# Patient Record
Sex: Male | Born: 1940 | Race: White | Hispanic: No | Marital: Married | Smoking: Never smoker
Health system: Southern US, Community
[De-identification: ages and names within clinical notes are randomized; demographics above are authoritative.]

## PROBLEM LIST (undated history)

## (undated) DIAGNOSIS — I2510 Atherosclerotic heart disease of native coronary artery without angina pectoris: Secondary | ICD-10-CM

## (undated) DIAGNOSIS — C801 Malignant (primary) neoplasm, unspecified: Secondary | ICD-10-CM

## (undated) DIAGNOSIS — E785 Hyperlipidemia, unspecified: Secondary | ICD-10-CM

## (undated) DIAGNOSIS — K219 Gastro-esophageal reflux disease without esophagitis: Secondary | ICD-10-CM

## (undated) DIAGNOSIS — F32A Depression, unspecified: Secondary | ICD-10-CM

## (undated) DIAGNOSIS — G4733 Obstructive sleep apnea (adult) (pediatric): Secondary | ICD-10-CM

## (undated) DIAGNOSIS — Z86010 Personal history of colonic polyps: Secondary | ICD-10-CM

## (undated) DIAGNOSIS — F419 Anxiety disorder, unspecified: Secondary | ICD-10-CM

## (undated) DIAGNOSIS — H269 Unspecified cataract: Secondary | ICD-10-CM

## (undated) DIAGNOSIS — F329 Major depressive disorder, single episode, unspecified: Secondary | ICD-10-CM

## (undated) DIAGNOSIS — E119 Type 2 diabetes mellitus without complications: Secondary | ICD-10-CM

## (undated) DIAGNOSIS — I10 Essential (primary) hypertension: Secondary | ICD-10-CM

## (undated) DIAGNOSIS — G4730 Sleep apnea, unspecified: Secondary | ICD-10-CM

## (undated) DIAGNOSIS — Z800 Family history of malignant neoplasm of digestive organs: Secondary | ICD-10-CM

## (undated) DIAGNOSIS — Z8619 Personal history of other infectious and parasitic diseases: Secondary | ICD-10-CM

## (undated) HISTORY — DX: Family history of malignant neoplasm of digestive organs: Z80.0

## (undated) HISTORY — DX: Personal history of colonic polyps: Z86.010

## (undated) HISTORY — DX: Type 2 diabetes mellitus without complications: E11.9

## (undated) HISTORY — DX: Essential (primary) hypertension: I10

## (undated) HISTORY — PX: COLONOSCOPY: SHX174

## (undated) HISTORY — DX: Malignant (primary) neoplasm, unspecified: C80.1

## (undated) HISTORY — DX: Unspecified cataract: H26.9

## (undated) HISTORY — DX: Sleep apnea, unspecified: G47.30

## (undated) HISTORY — DX: Personal history of other infectious and parasitic diseases: Z86.19

## (undated) HISTORY — PX: EYE SURGERY: SHX253

## (undated) HISTORY — PX: CHOLECYSTECTOMY: SHX55

## (undated) HISTORY — DX: Hyperlipidemia, unspecified: E78.5

## (undated) HISTORY — PX: TONSILLECTOMY: SHX5217

## (undated) HISTORY — DX: Obstructive sleep apnea (adult) (pediatric): G47.33

## (undated) HISTORY — DX: Atherosclerotic heart disease of native coronary artery without angina pectoris: I25.10

## (undated) HISTORY — PX: BIOPSY BREAST: PRO8

## (undated) HISTORY — PX: CORONARY ANGIOPLASTY WITH STENT PLACEMENT: SHX49

## (undated) HISTORY — DX: Gastro-esophageal reflux disease without esophagitis: K21.9

---
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
## 1998-03-04 ENCOUNTER — Encounter: Admission: RE | Admit: 1998-03-04 | Discharge: 1998-06-02 | Payer: Self-pay | Admitting: Family Medicine

## 1998-03-15 ENCOUNTER — Encounter: Payer: Self-pay | Admitting: Cardiovascular Disease

## 1998-03-15 ENCOUNTER — Inpatient Hospital Stay (HOSPITAL_COMMUNITY): Admission: EM | Admit: 1998-03-15 | Discharge: 1998-03-17 | Payer: Self-pay | Admitting: Emergency Medicine

## 1998-05-05 ENCOUNTER — Ambulatory Visit (HOSPITAL_COMMUNITY): Admission: RE | Admit: 1998-05-05 | Discharge: 1998-05-05 | Payer: Self-pay | Admitting: Gastroenterology

## 2002-05-29 ENCOUNTER — Emergency Department (HOSPITAL_COMMUNITY): Admission: EM | Admit: 2002-05-29 | Discharge: 2002-05-29 | Payer: Self-pay | Admitting: Emergency Medicine

## 2002-05-29 ENCOUNTER — Encounter: Payer: Self-pay | Admitting: Emergency Medicine

## 2004-02-04 ENCOUNTER — Ambulatory Visit (HOSPITAL_COMMUNITY): Admission: RE | Admit: 2004-02-04 | Discharge: 2004-02-04 | Payer: Self-pay | Admitting: Gastroenterology

## 2005-11-01 ENCOUNTER — Emergency Department (HOSPITAL_COMMUNITY): Admission: EM | Admit: 2005-11-01 | Discharge: 2005-11-01 | Payer: Self-pay | Admitting: Emergency Medicine

## 2007-05-23 ENCOUNTER — Encounter: Payer: Self-pay | Admitting: Family Medicine

## 2008-06-23 ENCOUNTER — Encounter: Payer: Self-pay | Admitting: Family Medicine

## 2009-07-09 ENCOUNTER — Ambulatory Visit: Payer: Self-pay | Admitting: Family Medicine

## 2009-07-09 DIAGNOSIS — I10 Essential (primary) hypertension: Secondary | ICD-10-CM

## 2009-07-09 DIAGNOSIS — Z860100 Personal history of colon polyps, unspecified: Secondary | ICD-10-CM | POA: Insufficient documentation

## 2009-07-09 DIAGNOSIS — I2510 Atherosclerotic heart disease of native coronary artery without angina pectoris: Secondary | ICD-10-CM | POA: Insufficient documentation

## 2009-07-09 DIAGNOSIS — E119 Type 2 diabetes mellitus without complications: Secondary | ICD-10-CM

## 2009-07-09 DIAGNOSIS — G4733 Obstructive sleep apnea (adult) (pediatric): Secondary | ICD-10-CM | POA: Insufficient documentation

## 2009-07-09 DIAGNOSIS — E785 Hyperlipidemia, unspecified: Secondary | ICD-10-CM

## 2009-07-09 HISTORY — DX: Hyperlipidemia, unspecified: E78.5

## 2009-07-09 HISTORY — DX: Personal history of colonic polyps: Z86.010

## 2009-07-09 HISTORY — DX: Essential (primary) hypertension: I10

## 2009-07-09 HISTORY — DX: Type 2 diabetes mellitus without complications: E11.9

## 2009-07-09 HISTORY — DX: Atherosclerotic heart disease of native coronary artery without angina pectoris: I25.10

## 2009-07-09 HISTORY — DX: Personal history of colon polyps, unspecified: Z86.0100

## 2009-07-09 HISTORY — DX: Obstructive sleep apnea (adult) (pediatric): G47.33

## 2009-07-09 LAB — CONVERTED CEMR LAB
Cholesterol, target level: 200 mg/dL
HDL goal, serum: 40 mg/dL
LDL Goal: 70 mg/dL

## 2009-07-14 LAB — CONVERTED CEMR LAB
ALT: 33 units/L (ref 0–53)
AST: 33 units/L (ref 0–37)
Albumin: 3.9 g/dL (ref 3.5–5.2)
Alkaline Phosphatase: 40 units/L (ref 39–117)
BUN: 14 mg/dL (ref 6–23)
Bilirubin, Direct: 0.2 mg/dL (ref 0.0–0.3)
CO2: 30 meq/L (ref 19–32)
Calcium: 9 mg/dL (ref 8.4–10.5)
Chloride: 111 meq/L (ref 96–112)
Cholesterol: 94 mg/dL (ref 0–200)
Creatinine, Ser: 0.9 mg/dL (ref 0.4–1.5)
Creatinine,U: 183.1 mg/dL
GFR calc non Af Amer: 89.22 mL/min (ref >60)
Glucose, Bld: 82 mg/dL (ref 70–99)
HDL: 41.9 mg/dL (ref >39.00)
Hgb A1c MFr Bld: 7.6 % — ABNORMAL HIGH (ref 4.6–6.5)
LDL Cholesterol: 35 mg/dL (ref 0–99)
Microalb Creat Ratio: 9.8 mg/g (ref 0.0–30.0)
Microalb, Ur: 1.8 mg/dL (ref 0.0–1.9)
Potassium: 4.3 meq/L (ref 3.5–5.1)
Sodium: 144 meq/L (ref 135–145)
Total Bilirubin: 0.7 mg/dL (ref 0.3–1.2)
Total CHOL/HDL Ratio: 2
Total Protein: 7.2 g/dL (ref 6.0–8.3)
Triglycerides: 87 mg/dL (ref 0.0–149.0)
VLDL: 17.4 mg/dL (ref 0.0–40.0)

## 2009-07-23 ENCOUNTER — Telehealth: Payer: Self-pay | Admitting: Family Medicine

## 2009-08-03 ENCOUNTER — Ambulatory Visit: Payer: Self-pay | Admitting: Family Medicine

## 2009-08-03 LAB — CONVERTED CEMR LAB
Bilirubin Urine: NEGATIVE
Blood in Urine, dipstick: NEGATIVE
Glucose, Urine, Semiquant: NEGATIVE
Ketones, urine, test strip: NEGATIVE
Nitrite: NEGATIVE
Protein, U semiquant: NEGATIVE
Specific Gravity, Urine: <1.005
Urobilinogen, UA: 0.2
WBC Urine, dipstick: NEGATIVE
pH: 5.5

## 2009-08-05 ENCOUNTER — Ambulatory Visit: Payer: Self-pay | Admitting: Family Medicine

## 2009-08-05 LAB — CONVERTED CEMR LAB
Basophils Absolute: 0 10*3/uL (ref 0.0–0.1)
Basophils Relative: 0.5 % (ref 0.0–3.0)
Eosinophils Absolute: 0.1 10*3/uL (ref 0.0–0.7)
Eosinophils Relative: 1.6 % (ref 0.0–5.0)
HCT: 43.4 % (ref 39.0–52.0)
Hemoglobin: 14.9 g/dL (ref 13.0–17.0)
Lymphocytes Relative: 17.4 % (ref 12.0–46.0)
Lymphs Abs: 0.9 10*3/uL (ref 0.7–4.0)
MCHC: 34.3 g/dL (ref 30.0–36.0)
MCV: 89.3 fL (ref 78.0–100.0)
Monocytes Absolute: 0.7 10*3/uL (ref 0.1–1.0)
Monocytes Relative: 12.5 % — ABNORMAL HIGH (ref 3.0–12.0)
Neutro Abs: 3.6 10*3/uL (ref 1.4–7.7)
Neutrophils Relative %: 68 % (ref 43.0–77.0)
PSA: 1.36 ng/mL (ref 0.10–4.00)
Platelets: 157 10*3/uL (ref 150.0–400.0)
RBC: 4.86 M/uL (ref 4.22–5.81)
RDW: 14.8 % — ABNORMAL HIGH (ref 11.5–14.6)
WBC: 5.2 10*3/uL (ref 4.5–10.5)

## 2009-08-27 ENCOUNTER — Ambulatory Visit: Payer: Self-pay

## 2009-08-27 ENCOUNTER — Ambulatory Visit: Payer: Self-pay | Admitting: Cardiovascular Disease

## 2009-08-27 ENCOUNTER — Encounter: Payer: Self-pay | Admitting: Family Medicine

## 2009-08-27 ENCOUNTER — Ambulatory Visit (HOSPITAL_COMMUNITY): Admission: RE | Admit: 2009-08-27 | Discharge: 2009-08-27 | Payer: Self-pay | Admitting: Family Medicine

## 2009-08-31 ENCOUNTER — Ambulatory Visit: Payer: Self-pay | Admitting: Family Medicine

## 2009-09-30 ENCOUNTER — Ambulatory Visit: Payer: Self-pay | Admitting: Family Medicine

## 2009-09-30 LAB — CONVERTED CEMR LAB: Hgb A1c MFr Bld: 7.2 % — ABNORMAL HIGH (ref 4.6–6.5)

## 2009-11-29 ENCOUNTER — Telehealth: Payer: Self-pay | Admitting: Family Medicine

## 2009-12-01 ENCOUNTER — Telehealth: Payer: Self-pay | Admitting: Family Medicine

## 2009-12-16 ENCOUNTER — Encounter: Payer: Self-pay | Admitting: Family Medicine

## 2009-12-20 ENCOUNTER — Telehealth: Payer: Self-pay | Admitting: Family Medicine

## 2010-06-02 NOTE — Assessment & Plan Note (Signed)
Summary: CPX//WILL FAST FOR ADDITIONAL LABS//CCM   Vital Signs:  Patient profile:   70 year old male Height:      69.75 inches Weight:      257 pounds Temp:     98.3 degrees F oral Pulse rate:   72 / minute Pulse rhythm:   regular Resp:     12 per minute BP sitting:   130 / 70  (left arm) Cuff size:   large  Vitals Entered By: Sid Falcon LPN (August 03, 1608 9:24 AM) CC: CPX, pt fasting Is Patient Diabetic? Yes Did you bring your meter with you today? No   History of Present Illness: Patient here for complete physical. 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  Chronic medical problems include type 2 diabetes, coronary artery disease with previous stents x2/10 years ago., obstructive sleep apnea, hypertension, hyperlipidemia, and history of colon polyps.  Last tetanus unknown. No history of hepatitis A.  Prior Pneumovax age 79. Due for repeat colonoscopy next year. Exercises 5 days per week. No recent chest pains. He recalls stress test approximately 3 years ago.  Allergies: 1)  Aleve (Naproxen Sodium)  Past History:  Past Surgical History: Last updated: 07/09/2009 Cholecystectomy  [id number] Tonsillectomy  1953 Breast biopsy 1996  Family History: Last updated: 07/09/2009 Mother, hypertension, heart disease, stroke (7) diabetes, pancreatic cancer, deceased age 41 Father, heart disease, hypertension Grandparents, heart disease uncle colon cancer  Social History: Last updated: 07/09/2009 Retired Married Never Smoked Alcohol use-no Regular exercise-yes  Risk Factors: Exercise: yes (07/09/2009)  Risk Factors: Smoking Status: never (07/09/2009)  Past Medical History: Chicken pox Diabetes  Type 2 heart disease  2 stents around 2001 Colonic polyps, hx of Hyperlipidemia Hypertension  Review of Systems  The patient denies anorexia, fever, weight gain, vision loss, decreased hearing, chest pain, syncope, dyspnea on exertion,  prolonged cough, headaches, hemoptysis, abdominal pain, melena, hematochezia, severe indigestion/heartburn, hematuria, incontinence, muscle weakness, suspicious skin lesions, depression, and enlarged lymph nodes.    Physical Exam  General:  Well-developed,well-nourished,in no acute distress; alert,appropriate and cooperative throughout examination Eyes:  pupils equal, pupils round, and pupils reactive to light.   Ears:  External ear exam shows no significant lesions or deformities.  Otoscopic examination reveals clear canals, tympanic membranes are intact bilaterally without bulging, retraction, inflammation or discharge. Hearing is grossly normal bilaterally. Mouth:  Oral mucosa and oropharynx without lesions or exudates.  Teeth in good repair. Neck:  No deformities, masses, or tenderness noted. Lungs:  Normal respiratory effort, chest expands symmetrically. Lungs are clear to auscultation, no crackles or wheezes. Heart:  normal rate, regular rhythm, and no gallop.   Abdomen:  soft, non-tender, no distention, no guarding, no rigidity, no hepatomegaly, and no splenomegaly.  abdomen for hernia which is soft and nontender Rectal:  No external abnormalities noted. Normal sphincter tone. No rectal masses or tenderness. Prostate:  Prostate gland firm and smooth, no enlargement, nodularity, tenderness, mass, asymmetry or induration. Msk:  No deformity or scoliosis noted of thoracic or lumbar spine.   Extremities:  trace edema lower legs bilaterally Neurologic:  alert & oriented X3, cranial nerves II-XII intact, strength normal in all extremities, and sensation intact to light touch.   Skin:  Intact without suspicious lesions or rashes Cervical Nodes:  No lymphadenopathy noted Psych:  normally interactive, good eye contact, not anxious appearing, and not depressed appearing.     Impression & Recommendations:  Problem # 1:  Preventive Health Care (ICD-V70.0)  Prevention issues discussed.  Tetanus  booster. Hep A rec for upcoming trip.  PPD required for forms but no signif risk factors.  PSA added to labs.  Problem # 2:  CAD (ICD-414.00) Forms require normal "stress echo" and pt has not seen cardiologist in some time.  No concerning symptoms but rec f/u with cardiologist. His updated medication list for this problem includes:    Furosemide 20 Mg Tabs (Furosemide) ..... Once daily    Isosorbide Mononitrate Cr 30 Mg Xr24h-tab (Isosorbide mononitrate) ..... Once daily    Lisinopril 40 Mg Tabs (Lisinopril) ..... Once daily    Aspirin 81 Mg Tabs (Aspirin) ..... Once daily    Metoprolol Tartrate 50 Mg Tabs (Metoprolol tartrate) ..... One tab two times a day  Orders: Cardiology Referral (Cardiology)  Complete Medication List: 1)  Acarbose 100 Mg Tabs (Acarbose) .... One tab three times a day 2)  Furosemide 20 Mg Tabs (Furosemide) .... Once daily 3)  Isosorbide Mononitrate Cr 30 Mg Xr24h-tab (Isosorbide mononitrate) .... Once daily 4)  Lisinopril 40 Mg Tabs (Lisinopril) .... Once daily 5)  Avandamet 07-998 Mg Tabs (Rosiglitazone-metformin) .... One by mouth two times a day 6)  Zetia 10 Mg Tabs (Ezetimibe) .... Once daily 7)  Crestor 20 Mg Tabs (Rosuvastatin calcium) .... Once daily 8)  Lantus 100 Unit/ml Soln (Insulin glargine) .... 76 units daily at bedtime 9)  Aspirin 81 Mg Tabs (Aspirin) .... Once daily 10)  Metoprolol Tartrate 50 Mg Tabs (Metoprolol tartrate) .... One tab two times a day  Other Orders: UA Dipstick w/o Micro (manual) (46962) Venipuncture (95284) Hepatitis A Vaccine (Adult Dose) (13244) Admin 1st Vaccine (01027) TB Skin Test (25366) Admin of Any Addtl Vaccine (44034) TD Toxoids IM 7 YR + (74259) TLB-CBC Platelet - w/Differential (85025-CBCD) TLB-PSA (Prostate Specific Antigen) (84153-PSA)   Laboratory Results   Urine Tests    Routine Urinalysis   Color: yellow Appearance: Clear Glucose: negative   (Normal Range: Negative) Bilirubin: negative   (Normal  Range: Negative) Ketone: negative   (Normal Range: Negative) Spec. Gravity: <1.005   (Normal Range: 1.003-1.035) Blood: negative   (Normal Range: Negative) pH: 5.5   (Normal Range: 5.0-8.0) Protein: negative   (Normal Range: Negative) Urobilinogen: 0.2   (Normal Range: 0-1) Nitrite: negative   (Normal Range: Negative) Leukocyte Esterace: negative   (Normal Range: Negative)    Comments: Sid Falcon LPN  August 03, 5636 12:42 PM      Immunizations Administered:  Hepatitis A Vaccine # 1:    Vaccine Type: HepA    Site: left deltoid    Mfr: GlaxoSmithKline    Dose: 1.0 ml    Route: IM    Given by: Sid Falcon LPN    Exp. Date: 08/18/2011    Lot #: [id number]  PPD Skin Test:    Vaccine Type: PPD    Site: right forearm    Mfr: Sanofi Pasteur    Dose: 0.1 ml    Route: ID    Given by: Sid Falcon LPN    Exp. Date: 09/26/2011    Lot #: [id number]  Tetanus Vaccine:    Vaccine Type: Td    Site: left deltoid    Mfr: Sanofi Pasteur    Dose: 0.5 ml    Route: IM    Given by: Sid Falcon LPN    Exp. Date: 03/16/2011    Lot #: [id number]

## 2010-06-02 NOTE — Assessment & Plan Note (Signed)
Summary: NEW PT EST // RS/PTS WIFE RSC/CJR   Vital Signs:  Patient profile:   70 year old male Height:      69.75 inches Weight:      157 pounds BMI:     22.77 Temp:     97.5 degrees F oral Pulse rate:   72 / minute Pulse rhythm:   regular Resp:     12 per minute BP sitting:   138 / 80  (left arm) Cuff size:   large  Vitals Entered By: Sid Falcon LPN (July 09, 2009 8:59 AM) CC: New to Establish from [facility], Hypertension Management, Lipid Management   History of Present Illness: New patient to establish care.  Chronic problems include history of type 2 diabetes, coronary artery disease with 2 prior stents around 1996, hypertension, hyperlipidemia, and history of colon polyps. Also history of obstructive sleep apnea on CPAP. Previous cholecystectomy 1993. Negative breast biopsy 1996.  No signif daytime somnolence.  CPAP seems to be working well.  No recent chest pains.  Sees cardiologist yearly.  Good exercise tolerance.  Allergy to Aleve. Family history reviewed significant for mother with type 2 diabetes and history of pancreatic cancer. Both parents have hypertension and history of stroke.  Nonsmoker. No alcohol use. Patient retired.  Diabetes Management History:      He is (or has been) enrolled in the "Diabetic Education Program".  He states understanding of dietary principles and is following his diet appropriately.  No sensory loss is reported.  Self foot exams are being performed.  He is checking home blood sugars.  He says that he is exercising.        Hypoglycemic symptoms are not occurring.  No hyperglycemic symptoms are reported.    Hypertension History:      He denies headache, chest pain, palpitations, dyspnea with exertion, orthopnea, PND, peripheral edema, visual symptoms, neurologic problems, syncope, and side effects from treatment.        Positive major cardiovascular risk factors include male age 25 years old or older, diabetes, hyperlipidemia, and  hypertension.  Negative major cardiovascular risk factors include non-tobacco-user status.        Positive history for target organ damage include ASHD (either angina/prior MI/prior CABG).  Further assessment for target organ damage reveals no history of stroke/TIA or peripheral vascular disease.    Lipid Management History:      Positive NCEP/ATP III risk factors include male age 69 years old or older, diabetes, hypertension, and ASHD (either angina/prior MI/prior CABG).  Negative NCEP/ATP III risk factors include non-tobacco-user status, no prior stroke/TIA, no peripheral vascular disease, and no history of aortic aneurysm.      Preventive Screening-Counseling & Management  Alcohol-Tobacco     Smoking Status: never  Caffeine-Diet-Exercise     Does Patient Exercise: yes  Allergies (verified): 1)  Aleve (Naproxen Sodium)  Past History:  Family History: Last updated: 07/09/2009 Mother, hypertension, heart disease, stroke (7) diabetes, pancreatic cancer, deceased age 40 Father, heart disease, hypertension Grandparents, heart disease uncle colon cancer  Social History: Last updated: 07/09/2009 Retired Married Never Smoked Alcohol use-no Regular exercise-yes  Risk Factors: Exercise: yes (07/09/2009)  Risk Factors: Smoking Status: never (07/09/2009)  Past Medical History: Chicken pox Diabetes heart disease Colonic polyps, hx of Hyperlipidemia Hypertension  Past Surgical History: Cholecystectomy  1993-1994 Tonsillectomy  1953 Breast biopsy 1996 PMH-FH-SH reviewed for relevance  Family History: Mother, hypertension, heart disease, stroke (7) diabetes, pancreatic cancer, deceased age 61 Father, heart disease, hypertension Grandparents, heart  disease uncle colon cancer  Social History: Retired Married Never Smoked Alcohol use-no Regular exercise-yes Smoking Status:  never Does Patient Exercise:  yes  Review of Systems  The patient denies anorexia,  fever, weight loss, weight gain, chest pain, syncope, dyspnea on exertion, peripheral edema, prolonged cough, headaches, hemoptysis, abdominal pain, melena, hematochezia, and severe indigestion/heartburn.    Physical Exam  General:  Well-developed,well-nourished,in no acute distress; alert,appropriate and cooperative throughout examination Head:  Normocephalic and atraumatic without obvious abnormalities. No apparent alopecia or balding. Eyes:  pupils equal, pupils round, and pupils reactive to light.   Ears:  External ear exam shows no significant lesions or deformities.  Otoscopic examination reveals clear canals, tympanic membranes are intact bilaterally without bulging, retraction, inflammation or discharge. Hearing is grossly normal bilaterally. Mouth:  Oral mucosa and oropharynx without lesions or exudates.  Teeth in good repair. Neck:  No deformities, masses, or tenderness noted. Lungs:  Normal respiratory effort, chest expands symmetrically. Lungs are clear to auscultation, no crackles or wheezes. Heart:  Normal rate and regular rhythm. S1 and S2 normal without gallop, murmur, click, rub or other extra sounds. Abdomen:  Bowel sounds positive,abdomen soft and non-tender without masses, organomegaly or hernias noted. Pulses:  R radial normal and L radial normal.   Extremities:  No clubbing, cyanosis, edema, or deformity noted with normal full range of motion of all joints.   Neurologic:  alert & oriented X3 and cranial nerves II-XII intact.   Skin:  Intact without suspicious lesions or rashes Cervical Nodes:  No lymphadenopathy noted Psych:  normally interactive, good eye contact, not anxious appearing, and not depressed appearing.    Diabetes Management Exam:    Foot Exam (with socks and/or shoes not present):       Sensory-Pinprick/Light touch:          Left medial foot (L-4): normal          Left dorsal foot (L-5): normal          Left lateral foot (S-1): normal          Right  medial foot (L-4): normal          Right dorsal foot (L-5): normal          Right lateral foot (S-1): normal       Sensory-Monofilament:          Left foot: normal          Right foot: normal       Inspection:          Left foot: normal          Right foot: normal       Nails:          Left foot: normal          Right foot: normal    Eye Exam:       Eye Exam done elsewhere          Date: 01/29/2009          Results: normal          Done by: Dr Elmer Picker   Impression & Recommendations:  Problem # 1:  HYPERTENSION (ICD-401.9) not to goal today but pt has recently started exercise program and plans to lose some weight.  Goal < 130/80. His updated medication list for this problem includes:    Furosemide 20 Mg Tabs (Furosemide) ..... Once daily    Lisinopril 40 Mg Tabs (Lisinopril) ..... Once daily  Orders: Venipuncture (16109)  TLB-BMP (Basic Metabolic Panel-BMET) (80048-METABOL)  Problem # 2:  HYPERLIPIDEMIA (ICD-272.4) Needs repeat labs. His updated medication list for this problem includes:    Zetia 10 Mg Tabs (Ezetimibe) ..... Once daily    Crestor 20 Mg Tabs (Rosuvastatin calcium) ..... Once daily  Orders: Venipuncture (91478) TLB-Lipid Panel (80061-LIPID) TLB-Hepatic/Liver Function Pnl (80076-HEPATIC)  Problem # 3:  CAD (ICD-414.00) Assessment: Unchanged  His updated medication list for this problem includes:    Furosemide 20 Mg Tabs (Furosemide) ..... Once daily    Isosorbide Mononitrate Cr 30 Mg Xr24h-tab (Isosorbide mononitrate) ..... Once daily    Lisinopril 40 Mg Tabs (Lisinopril) ..... Once daily    Aspirin 81 Mg Tabs (Aspirin) ..... Once daily  Problem # 4:  OBSTRUCTIVE SLEEP APNEA (ICD-327.23) Assessment: Unchanged  Problem # 5:  DIAB W/O COMP TYPE II/UNS NOT STATED UNCNTRL (ICD-250.00)  His updated medication list for this problem includes:    Acarbose 100 Mg Tabs (Acarbose) ..... One tab three times a day    Lisinopril 40 Mg Tabs (Lisinopril) .....  Once daily    Avandamet 06-998 Mg Tabs (Rosiglitazone-metformin) .Marland Kitchen..Marland Kitchen Two times a day    Lantus 100 Unit/ml Soln (Insulin glargine) .Marland Kitchen... 76 units daily at bedtime    Aspirin 81 Mg Tabs (Aspirin) ..... Once daily  Orders: Venipuncture (29562) TLB-A1C / Hgb A1C (Glycohemoglobin) (83036-A1C) TLB-Microalbumin/Creat Ratio, Urine (82043-MALB)  Problem # 6:  COLONIC POLYPS, HX OF (ICD-V12.72) not due for repeat colonoscopy until next year.  Complete Medication List: 1)  Acarbose 100 Mg Tabs (Acarbose) .... One tab three times a day 2)  Furosemide 20 Mg Tabs (Furosemide) .... Once daily 3)  Isosorbide Mononitrate Cr 30 Mg Xr24h-tab (Isosorbide mononitrate) .... Once daily 4)  Lisinopril 40 Mg Tabs (Lisinopril) .... Once daily 5)  Avandamet 06-998 Mg Tabs (Rosiglitazone-metformin) .... Two times a day 6)  Zetia 10 Mg Tabs (Ezetimibe) .... Once daily 7)  Crestor 20 Mg Tabs (Rosuvastatin calcium) .... Once daily 8)  Lantus 100 Unit/ml Soln (Insulin glargine) .... 76 units daily at bedtime 9)  Aspirin 81 Mg Tabs (Aspirin) .... Once daily  Diabetes Management Assessment/Plan:      The following lipid goals have been established for the patient: Total cholesterol goal of 200; LDL cholesterol goal of 70; HDL cholesterol goal of 40; Triglyceride goal of 150.    Hypertension Assessment/Plan:      The patient's hypertensive risk group is category C: Target organ damage and/or diabetes.  Today's blood pressure is 138/80.    Lipid Assessment/Plan:      Based on NCEP/ATP III, the patient's risk factor category is "history of coronary disease, peripheral vascular disease, cerebrovascular disease, or aortic aneurysm along with either diabetes, current smoker, or LDL > 130 plus HDL < 40 plus triglycerides > 200".  The patient's lipid goals are as follows: Total cholesterol goal is 200; LDL cholesterol goal is 70; HDL cholesterol goal is 40; Triglyceride goal is 150.    Patient Instructions: 1)  It is  important that you exercise reguarly at least 20 minutes 5 times a week. If you develop chest pain, have severe difficulty breathing, or feel very tired, stop exercising immediately and seek medical attention.  2)  You need to lose weight. Consider a lower calorie diet and regular exercise.  3)  Check your blood sugars regularly. If your readings are usually above:  or below 70 you should contact our office.  4)  It is important that your diabetic A1c level is  checked every 3 months.  5)  See your eye doctor yearly to check for diabetic eye damage. 6)  Check your feet each night  for sore areas, calluses or signs of infection.  7)  Check your  Blood Pressure regularly . If it is above: 140/90  you should make an appointment. 8)  Please schedule a follow-up appointment in 3 months .  Prescriptions: LANTUS 100 UNIT/ML SOLN (INSULIN GLARGINE) 76 units daily at bedtime  #9 vials x 3   Entered and Authorized by:   Evelena Peat MD   Signed by:   Evelena Peat MD on 07/09/2009   Method used:   Electronically to        [facility]* (mail-order)             ,          Ph: [phone]       Fax: [phone]   RxID:   [id number] CRESTOR 20 MG TABS (ROSUVASTATIN CALCIUM) once daily  #90 x 3   Entered and Authorized by:   Evelena Peat MD   Signed by:   Evelena Peat MD on 07/09/2009   Method used:   Electronically to        [facility]* (mail-order)             ,          Ph: [phone]       Fax: [phone]   RxID:   [id number] ZETIA 10 MG TABS (EZETIMIBE) once daily  #90 x 3   Entered and Authorized by:   Evelena Peat MD   Signed by:   Evelena Peat MD on 07/09/2009   Method used:   Electronically to        [facility]* (mail-order)             ,          Ph: [phone]       Fax: [phone]   RxID:   [id number] AVANDAMET 06-998 MG TABS (ROSIGLITAZONE-METFORMIN) two times a day  #180 x 3   Entered and Authorized by:   Evelena Peat MD    Signed by:   Evelena Peat MD on 07/09/2009   Method used:   Electronically to        [facility]* (mail-order)             ,          Ph: [phone]       Fax: [phone]   RxID:   [id number] LISINOPRIL 40 MG TABS (LISINOPRIL) once daily  #90 x 3   Entered and Authorized by:   Evelena Peat MD   Signed by:   Evelena Peat MD on 07/09/2009   Method used:   Electronically to        [facility]* (mail-order)             ,          Ph: [phone]       Fax: [phone]   RxID:   [id number] ISOSORBIDE MONONITRATE CR 30 MG XR24H-TAB (ISOSORBIDE MONONITRATE) once daily  #90 x 3   Entered and Authorized by:   Evelena Peat MD   Signed by:   Evelena Peat MD on 07/09/2009   Method used:   Electronically to        [facility]* (mail-order)             ,  Ph: [phone]       Fax: [phone]   RxID:   [id number] FUROSEMIDE 20 MG TABS (FUROSEMIDE) once daily  #90 x 3   Entered and Authorized by:   Evelena Peat MD   Signed by:   Evelena Peat MD on 07/09/2009   Method used:   Electronically to        [facility]* (mail-order)             ,          Ph: [phone]       Fax: [phone]   RxID:   [id number] ACARBOSE 100 MG TABS (ACARBOSE) one tab three times a day  #270 x 3   Entered and Authorized by:   Evelena Peat MD   Signed by:   Evelena Peat MD on 07/09/2009   Method used:   Electronically to        [facility]* (mail-order)             ,          Ph: [phone]       Fax: [phone]   RxID:   [id number]

## 2010-06-02 NOTE — Letter (Signed)
Summary: Physician's Health Evaluation  Physician's Health Evaluation   Imported By: Maryln Gottron 09/01/2009 13:46:48  _____________________________________________________________________  External Attachment:    Type:   Image     Comment:   External Document

## 2010-06-02 NOTE — Assessment & Plan Note (Signed)
Summary: HEP B INJ/PER NANCY/CJR  Nurse Visit   Allergies: 1)  Aleve (Naproxen Sodium)  Immunizations Administered:  Hepatitis B Vaccine # 1:    Vaccine Type: HepB NB-31yrs    Site: left deltoid    Mfr: Merck    Dose: 1.0 ml    Route: IM    Given by: Sid Falcon LPN    Exp. Date: 07/29/2011    Lot #: [id number]  Orders Added: 1)  Hepatitis B Vaccine NB-23yrs [40981] 2)  Admin 1st Vaccine [19147]

## 2010-06-02 NOTE — Medication Information (Signed)
Summary: Denial of Coverage for Actoplus Met Xr  Denial of Coverage for Actoplus Met Xr   Imported By: Maryln Gottron 12/24/2009 09:22:48  _____________________________________________________________________  External Attachment:    Type:   Image     Comment:   External Document

## 2010-06-02 NOTE — Progress Notes (Signed)
Summary: alternative requested  Phone Note Call from Patient Call back at Home Phone [phone]   Caller: [facility] call Summary of Call: [facility] denied coverage of Actoplus Met XR. pt would like to consider having 2 separate rx, one for Actos and the other for Metformin. if ok, please send  rx for mail order to [facility]. Initial call taken by: Warnell Forester,  December 20, 2009 10:15 AM  Follow-up for Phone Call        done  Follow-up by: Evelena Peat MD,  December 20, 2009 1:04 PM  Additional Follow-up for Phone Call Additional follow up Details #1::        wife is aware of above. Additional Follow-up by: Warnell Forester,  December 20, 2009 1:06 PM    New/Updated Medications: METFORMIN HCL 1000 MG TABS (METFORMIN HCL) one by mouth two times a day ACTOS 30 MG TABS (PIOGLITAZONE HCL) one by mouth once daily Prescriptions: METFORMIN HCL 1000 MG TABS (METFORMIN HCL) one by mouth two times a day  #180 x 3   Entered and Authorized by:   Evelena Peat MD   Signed by:   Evelena Peat MD on 12/20/2009   Method used:   Electronically to        [facility]* (retail)             ,          Ph: [phone]       Fax: [phone]   RxID:   [id number] ACTOS 30 MG TABS (PIOGLITAZONE HCL) one by mouth once daily  #90 x 3   Entered and Authorized by:   Evelena Peat MD   Signed by:   Evelena Peat MD on 12/20/2009   Method used:   Electronically to        [facility]* (retail)             ,          Ph: [phone]       Fax: [phone]   RxID:   [id number]

## 2010-06-02 NOTE — Progress Notes (Signed)
Summary: ActoplusMet XR WCB 8-3  Phone Note Call from Patient Call back at Home Phone [phone]   Caller: vm wife, linda Summary of Call: ActoplusMet XR tabs is not covered by our benefits.  Call [facility] [phone] to ask for an exception, then there is a coverage review and they will decide if they will cover the med.  Call me back at [phone] when you need to call me back.   Initial call taken by: Rudy Jew, RN,  December 01, 2009 4:59 PM  Follow-up for Phone Call        Will they cover Actos and Metformin separately? (ie. not in combination pill) Follow-up by: Evelena Peat MD,  December 01, 2009 5:16 PM  Additional Follow-up for Phone Call Additional follow up Details #1::        Wife will check on this & call in am.    Insurance company is not covered the Actos Plus is covered.  Will Metformin, and Actos 15 mg. but only 90 days.  Wife will get the 90 day refill, and have [facility] sent referral request. Additional Follow-up by: Rudy Jew, RN,  December 01, 2009 5:31 PM

## 2010-06-02 NOTE — Letter (Signed)
Summary: Physician's Health Evaluation for [facility]  Physician's Health Evaluation for [facility]   Imported By: Maryln Gottron 10/29/2009 14:33:03  _____________________________________________________________________  External Attachment:    Type:   Image     Comment:   External Document

## 2010-06-02 NOTE — Progress Notes (Signed)
Summary: change of meds.  Phone Note Call from Patient   Caller: Patient Call For: Evelena Peat MD Summary of Call: [facility] for 3 months. Is finishing Avandamet, and needs the subtitute sent to [facility], please. [phone]  Initial call taken by: Lynann Beaver CMA,  November 29, 2009 3:33 PM  Follow-up for Phone Call        will change to ActoPlusmet 15/1000 mg by mouth two times a day as discussed. Follow-up by: Evelena Peat MD,  November 29, 2009 5:39 PM    New/Updated Medications: ACTOPLUS MET XR 15-1000 MG XR24H-TAB (PIOGLITAZONE HCL-METFORMIN HCL) one by mouth two times a day Prescriptions: ACTOPLUS MET XR 15-1000 MG XR24H-TAB (PIOGLITAZONE HCL-METFORMIN HCL) one by mouth two times a day  #180 x 3   Entered and Authorized by:   Evelena Peat MD   Signed by:   Evelena Peat MD on 11/29/2009   Method used:   Electronically to        [facility] (retail)             ,          Ph: [phone]       Fax: [phone]   RxID:   [id number]  Pt. advised.

## 2010-06-02 NOTE — Progress Notes (Signed)
Summary: Actoplus Met denied by [facility]  Phone Note Outgoing Call   Summary of Call: Actoplus Met has been denied by [facility]. pt and and wife are aware. Initial call taken by: Warnell Forester,  December 20, 2009 10:17 AM

## 2010-06-02 NOTE — Progress Notes (Signed)
Summary: needs meds from [facility], Avandamet dose question  Phone Note Call from Patient Call back at Home Phone [phone] Message from:  Patient  Caller: Spouse----live call Call For: Evelena Peat MD Summary of Call: Needs new rx for Metoprolol 50mg  send to [facility].  Wife says that patient is on Avandamet 07-998 mg, not 2-1000mg . Please send a new rx to [facility] and note in his med chart. Thanks. Initial call taken by: Warnell Forester,  July 23, 2009 12:51 PM  Follow-up for Phone Call        Wife states metroprolol 50mg  is on the med list he brought home.  I do not see it?  Also she wants to know how to handle the Avandamet 07-998, what is the difference between the 07-998 and the 06-998?  A new Rx will need to be sent to [facility] for both? Follow-up by: Sid Falcon LPN,  July 23, 2009 1:11 PM  Additional Follow-up for Phone Call Additional follow up Details #1::        OK to refill both as long as he has been on metoprolol in the past.  The difference in the 06-998 and 07-998 is the Avandia component of Avandamet.  Refill both for 1 years. Additional Follow-up by: Evelena Peat MD,  July 23, 2009 1:27 PM    Additional Follow-up for Phone Call Additional follow up Details #2::    Metoprolol 50mg  two times a day confirmed with wife, sent to [facility].  Pt has received the Avandamet 06-998 from [facility] already, #90.  What to do, try to return it or take it until he needs a refill and then make the correction to 07-998? Sid Falcon LPN  July 23, 2009 2:11 PM  I think the 06-998 combination will not be vastly different from the 07-998 dose in terms of glucose effect and pt is in process of weight loss program which will improve his sugars greatly so lets have him complete his current prescription.  Follow-up by: Evelena Peat MD,  July 23, 2009 4:18 PM  Additional Follow-up for Phone Call Additional follow up Details #3:: Details for Additional Follow-up Action Taken: Pt wife  informed Additional Follow-up by: Sid Falcon LPN,  July 23, 2009 4:57 PM  New/Updated Medications: AVANDAMET 07-998 MG TABS (ROSIGLITAZONE-METFORMIN) two tab daily AVANDAMET 07-998 MG TABS (ROSIGLITAZONE-METFORMIN) one by mouth two times a day METOPROLOL TARTRATE 50 MG TABS (METOPROLOL TARTRATE) one tab two times a day Prescriptions: METOPROLOL TARTRATE 50 MG TABS (METOPROLOL TARTRATE) one tab two times a day  #180 x 3   Entered by:   Sid Falcon LPN   Authorized by:   Evelena Peat MD   Signed by:   Sid Falcon LPN on 09/81/1914   Method used:   Electronically to        [facility] (mail-order)             ,          Ph: [phone]       Fax: [phone]   RxID:   [id number]

## 2010-06-02 NOTE — Letter (Signed)
Summary: Records from [facility] 2008 -2010  Records from [facility] 2008 -2010   Imported By: Maryln Gottron 2020-09-209 13:45:08  _____________________________________________________________________  External Attachment:    Type:   Image     Comment:   External Document

## 2010-06-02 NOTE — Assessment & Plan Note (Signed)
Summary: TB TEST READING/[facility]  Nurse Visit   Allergies: 1)  Aleve (Naproxen Sodium)  PPD Results    Date of reading: 08/05/2009    Results: < 5mm    Interpretation: negative

## 2010-06-02 NOTE — Assessment & Plan Note (Signed)
Summary: 3 MONTH FUP-FASTING//CCM wife rsc/njr   Vital Signs:  Patient profile:   70 year old male Height:      69.75 inches Weight:      252 pounds Temp:     97.7 degrees F oral Pulse rate:   80 / minute Pulse rhythm:   regular Resp:     12 per minute BP sitting:   140 / 72  (left arm) Cuff size:   large  Vitals Entered By: Sid Falcon LPN (September 30, 452 8:34 AM) CC: 3 month follow-up   History of Present Illness: Follow up diabetes.  Last A1C 7.6%.  Losing weight through his efforts. No hypoglycemia.  Meds reviewed.   FAsting CBGs ranging 75-115.  Eye exam 2/11.  No retinopathy.  Diabetes Management History:      He is (or has been) enrolled in the "Diabetic Education Program".  He states understanding of dietary principles and is following his diet appropriately.  No sensory loss is reported.  Self foot exams are being performed.  He is checking home blood sugars.  He says that he is exercising.        Hypoglycemic symptoms are not occurring.  No hyperglycemic symptoms are reported.    Allergies: 1)  Aleve (Naproxen Sodium)  Past History:  Past Medical History: Last updated: 08/03/2009 Chicken pox Diabetes  Type 2 heart disease  2 stents around 2001 Colonic polyps, hx of Hyperlipidemia Hypertension PMH reviewed for relevance  Review of Systems      See HPI  Physical Exam  General:  Well-developed,well-nourished,in no acute distress; alert,appropriate and cooperative throughout examination Lungs:  Normal respiratory effort, chest expands symmetrically. Lungs are clear to auscultation, no crackles or wheezes. Heart:  normal rate and regular rhythm.   Extremities:  trace edema.  Diabetes Management Exam:    Foot Exam (with socks and/or shoes not present):       Sensory-Pinprick/Light touch:          Left medial foot (L-4): normal          Left dorsal foot (L-5): normal          Left lateral foot (S-1): normal          Right medial foot (L-4): normal      Right dorsal foot (L-5): normal          Right lateral foot (S-1): normal       Sensory-Monofilament:          Left foot: normal          Right foot: normal       Inspection:          Left foot: normal          Right foot: normal       Nails:          Left foot: normal          Right foot: normal    Eye Exam:       Eye Exam done elsewhere          Date: 06/02/2009          Results: normal   Impression & Recommendations:  Problem # 1:  DIAB W/O COMP TYPE II/UNS NOT STATED UNCNTRL (ICD-250.00) Assessment Unchanged discussed possible change to Actos Met and pt will use current supply of Avandamet first. His updated medication list for this problem includes:    Acarbose 100 Mg Tabs (Acarbose) ..... One tab three times a  day    Lisinopril 40 Mg Tabs (Lisinopril) ..... Once daily    Avandamet 07-998 Mg Tabs (Rosiglitazone-metformin) ..... One by mouth two times a day    Lantus 100 Unit/ml Soln (Insulin glargine) .Marland Kitchen... 76 units daily at bedtime    Aspirin 81 Mg Tabs (Aspirin) ..... Once daily  Orders: TLB-A1C / Hgb A1C (Glycohemoglobin) (83036-A1C) Venipuncture (04540)  Problem # 2:  Preventive Health Care (ICD-V70.0) pt needs 2nd hep B (for mission trip) and will return in 4 months for 3 rd Hep B and 2nd Hep A.  Complete Medication List: 1)  Acarbose 100 Mg Tabs (Acarbose) .... One tab three times a day 2)  Furosemide 20 Mg Tabs (Furosemide) .... Once daily 3)  Isosorbide Mononitrate Cr 30 Mg Xr24h-tab (Isosorbide mononitrate) .... Once daily 4)  Lisinopril 40 Mg Tabs (Lisinopril) .... Once daily 5)  Avandamet 07-998 Mg Tabs (Rosiglitazone-metformin) .... One by mouth two times a day 6)  Zetia 10 Mg Tabs (Ezetimibe) .... Once daily 7)  Crestor 20 Mg Tabs (Rosuvastatin calcium) .... Once daily 8)  Lantus 100 Unit/ml Soln (Insulin glargine) .... 76 units daily at bedtime 9)  Aspirin 81 Mg Tabs (Aspirin) .... Once daily 10)  Metoprolol Tartrate 50 Mg Tabs (Metoprolol  tartrate) .... One tab two times a day  Other Orders: State-Hepatitis B Vaccine Ped/Adol 3 dose IM  (98119J) Admin 1st Vaccine (47829)  Diabetes Management Assessment/Plan:      The following lipid goals have been established for the patient: Total cholesterol goal of 200; LDL cholesterol goal of 70; HDL cholesterol goal of 40; Triglyceride goal of 150.    Patient Instructions: 1)  Please schedule a follow-up appointment in 4 months .  2)  You need to lose weight. Consider a lower calorie diet and regular exercise.  3)  Check your blood sugars regularly. If your readings are usually above:  or below 70 you should contact our office.  4)  It is important that your diabetic A1c level is checked every 3 months.  5)  See your eye doctor yearly to check for diabetic eye damage. 6)  Check your feet each night  for sore areas, calluses or signs of infection.     Immunizations Administered:  Hepatitis B Vaccine # 2:    Vaccine Type: State HepB Ped/Adol    Site: left deltoid    Mfr: Merck    Dose: 1.0 ml    Route: IM    Given by: Sid Falcon LPN    Exp. Date: 07/29/2011    Lot #: [id number]

## 2010-11-30 HISTORY — PX: CORONARY ARTERY BYPASS GRAFT: SHX141

## 2011-08-11 ENCOUNTER — Ambulatory Visit (INDEPENDENT_AMBULATORY_CARE_PROVIDER_SITE_OTHER): Payer: 59 | Admitting: Family Medicine

## 2011-08-11 ENCOUNTER — Encounter: Payer: Self-pay | Admitting: Family Medicine

## 2011-08-11 VITALS — BP 130/70 | Temp 98.4°F | Wt 228.0 lb

## 2011-08-11 DIAGNOSIS — E119 Type 2 diabetes mellitus without complications: Secondary | ICD-10-CM

## 2011-08-11 DIAGNOSIS — I10 Essential (primary) hypertension: Secondary | ICD-10-CM

## 2011-08-11 DIAGNOSIS — I2510 Atherosclerotic heart disease of native coronary artery without angina pectoris: Secondary | ICD-10-CM

## 2011-08-11 DIAGNOSIS — E785 Hyperlipidemia, unspecified: Secondary | ICD-10-CM

## 2011-08-11 LAB — BASIC METABOLIC PANEL
Calcium: 9.1 mg/dL (ref 8.4–10.5)
GFR: 82.31 mL/min (ref >60.00)
Glucose, Bld: 105 mg/dL — ABNORMAL HIGH (ref 70–99)
Potassium: 4.1 mEq/L (ref 3.5–5.1)
Sodium: 141 mEq/L (ref 135–145)

## 2011-08-11 LAB — HEMOGLOBIN A1C: Hgb A1c MFr Bld: 6.2 % (ref 4.6–6.5)

## 2011-08-11 NOTE — Progress Notes (Signed)
  Subjective:    Patient ID: A Tristan Hughes, male    DOB: [birth year], 71 y.o.   MRN: [id number]  HPI  Patient just returned from 18 months of living in [place] on a mission trip with their church. He has chronic problems including history of obesity, type 2 diabetes, hyperlipidemia, obstructive sleep apnea, hypertension, and diagnosed with CAD while in [place]. He developed last August some exertional chest discomfort. Underwent catheterization which revealed left main 3 vessel disease.  Catheterization revealed 90% stenosis of the left main, 90% stenosis of LAD, 99% stenosis of circumflex and 80% stenosis RCA. Ejection fraction greater than 50%. Patient underwent four-vessel bypass graft without complication.  Doing well this time. He is walking about 10 miles per week. Has lost some weight due to his efforts since his surgery. No recurrent chest pain. Diabetes prior to leaving has been well controlled recent A1c 6.1%. Currently taking a regimen of Lantus insulin 46 units daily and Humalog 3 times daily with meals (varies between 15-18 units). No recent hypoglycemia. Needs followup eye exam.  Hyperlipidemia treated with Crestor 20 mg daily, Zetia 10 mg daily.  Compliant with all medications. Denies any side effects.  On furosemide 20 mg twice a day for some edema issues.    Review of Systems  Constitutional: Negative for fever, activity change, appetite change and fatigue.  HENT: Negative for ear pain, congestion and trouble swallowing.   Eyes: Negative for pain and visual disturbance.  Respiratory: Negative for cough, shortness of breath and wheezing.   Cardiovascular: Negative for chest pain and palpitations.  Gastrointestinal: Negative for nausea, vomiting, abdominal pain, diarrhea, constipation, blood in stool, abdominal distention and rectal pain.  Genitourinary: Negative for dysuria, hematuria and testicular pain.  Musculoskeletal: Negative for joint swelling and  arthralgias.  Skin: Negative for rash.  Neurological: Negative for dizziness, syncope and headaches.  Hematological: Negative for adenopathy.  Psychiatric/Behavioral: Negative for confusion and dysphoric mood.       Objective:   Physical Exam  Constitutional: He is oriented to person, place, and time. He appears well-developed and well-nourished.  HENT:  Right Ear: External ear normal.  Left Ear: External ear normal.  Mouth/Throat: Oropharynx is clear and moist.  Neck: Neck supple. No thyromegaly present.  Cardiovascular: Normal rate and regular rhythm.  Exam reveals no gallop.   Pulmonary/Chest: Effort normal and breath sounds normal. No respiratory distress. He has no wheezes. He has no rales.  Musculoskeletal: He exhibits no edema.       Feet reveal no skin lesions. Good distal foot pulses. Good capillary refill. No calluses. Normal sensation with monofilament testing   Neurological: He is alert and oriented to person, place, and time. No cranial nerve deficit.          Assessment & Plan:  #1 CAD with bypass times four 12/03/2010 and has done well since then  #2 hyperlipidemia. Recent lipids prior to leaving [place] at goal and stable. Refill current medications #3 type 2 diabetes. Recent excellent control. Recheck A1c. Schedule eye exam. Confirm prior Pneumovax #4 hypertension stable. Refill metoprolol, furosemide, and lisinopril.

## 2011-08-11 NOTE — Patient Instructions (Signed)
Schedule eye exam soon Check on previous pneumovax

## 2011-08-13 ENCOUNTER — Encounter: Payer: Self-pay | Admitting: Family Medicine

## 2011-08-15 NOTE — Progress Notes (Signed)
Quick Note:  Pt informed ______ 

## 2011-08-21 ENCOUNTER — Encounter: Payer: Self-pay | Admitting: Family Medicine

## 2011-08-22 ENCOUNTER — Telehealth: Payer: Self-pay | Admitting: Family Medicine

## 2011-08-22 NOTE — Telephone Encounter (Signed)
Patient called in stating that his all of his rxs were supposed [facility] and this was never done. pts id number is [id number]. Please assist.

## 2011-08-23 MED ORDER — ROSUVASTATIN CALCIUM 20 MG PO TABS: 20.0000 mg | ORAL_TABLET | Freq: Every day | ORAL | Status: DC

## 2011-08-23 MED ORDER — METOPROLOL TARTRATE 50 MG PO TABS: 50.0000 mg | ORAL_TABLET | Freq: Two times a day (BID) | ORAL | Status: DC

## 2011-08-23 MED ORDER — METFORMIN HCL 1000 MG PO TABS: 1000.0000 mg | ORAL_TABLET | Freq: Two times a day (BID) | ORAL | Status: DC

## 2011-08-23 MED ORDER — INSULIN GLARGINE 100 UNIT/ML ~~LOC~~ SOLN: SUBCUTANEOUS | Status: DC

## 2011-08-23 MED ORDER — LISINOPRIL 40 MG PO TABS: 40.0000 mg | ORAL_TABLET | Freq: Every day | ORAL | Status: DC

## 2011-08-23 MED ORDER — FUROSEMIDE 20 MG PO TABS: 20.0000 mg | ORAL_TABLET | Freq: Two times a day (BID) | ORAL | Status: DC

## 2011-08-23 MED ORDER — EZETIMIBE 10 MG PO TABS: 10.0000 mg | ORAL_TABLET | Freq: Every day | ORAL | Status: DC

## 2011-08-24 ENCOUNTER — Ambulatory Visit: Payer: Self-pay | Admitting: Family Medicine

## 2011-11-23 ENCOUNTER — Other Ambulatory Visit: Payer: Self-pay | Admitting: *Deleted

## 2011-11-23 ENCOUNTER — Encounter: Payer: Self-pay | Admitting: Family Medicine

## 2011-11-23 MED ORDER — METOPROLOL TARTRATE 50 MG PO TABS: ORAL_TABLET | ORAL | Status: DC

## 2011-12-11 ENCOUNTER — Ambulatory Visit: Payer: 59 | Admitting: Family Medicine

## 2011-12-19 ENCOUNTER — Encounter: Payer: Self-pay | Admitting: Family Medicine

## 2011-12-19 ENCOUNTER — Ambulatory Visit (INDEPENDENT_AMBULATORY_CARE_PROVIDER_SITE_OTHER): Payer: 59 | Admitting: Family Medicine

## 2011-12-19 VITALS — BP 130/70 | Temp 98.1°F | Wt 225.0 lb

## 2011-12-19 DIAGNOSIS — E785 Hyperlipidemia, unspecified: Secondary | ICD-10-CM

## 2011-12-19 DIAGNOSIS — I10 Essential (primary) hypertension: Secondary | ICD-10-CM

## 2011-12-19 DIAGNOSIS — E119 Type 2 diabetes mellitus without complications: Secondary | ICD-10-CM

## 2011-12-19 NOTE — Progress Notes (Signed)
  Subjective:    Patient ID: Tristan Hughes, male    DOB: 1941/03/08, 71 y.o.   MRN: [id number]  HPI  Followup type 2 diabetes. Patient has done an excellent job of weight loss. Has lost 3 more pounds. Altogether he has lost about 32 pounds. Bypass one year ago. No recent chest pain. Exercising regularly. No hypoglycemia. Takes Lantus 44 units daily and Humalog 16 units with meals. Last A1c 6.1%. Other medications reviewed. Remains on Crestor. No myalgias. Lipids were checked back before he moved from [place] and stable. Pulse has remained stable between 60 and 65. Continues metoprolol 50 mg one and one half tablets twice daily  Past Medical History  Diagnosis Date  . CAD 07/09/2009    Cath 12-02-10 90% left main, 90%LAD, 99% circumflex, 80% RCA  . COLONIC POLYPS, HX OF 07/09/2009    Qualifier: Diagnosis of  By: Gabriel Rung LPN, Harriett Sine    . DIAB W/O COMP TYPE II/UNS NOT STATED UNCNTRL 07/09/2009    Qualifier: Diagnosis of  By: Rita Ohara    . HYPERLIPIDEMIA 07/09/2009    Qualifier: Diagnosis of  By: Gabriel Rung LPN, Harriett Sine    . HYPERTENSION 07/09/2009    Qualifier: Diagnosis of  By: Gabriel Rung LPN, Harriett Sine    . OBSTRUCTIVE SLEEP APNEA 07/09/2009    Qualifier: Diagnosis of  By: Rita Ohara    . History of chicken pox    Past Surgical History  Procedure Date  . Cholecystectomy   . Biopsy breast   . Tonsillectomy   . Coronary angioplasty with stent placement   . Coronary artery bypass graft 8/12    CABG X 4 LIMA-LAD, SVG-PDB, SVG-OM/RAMUS   /    reports that he has never smoked. He has never used smokeless tobacco. He reports that he does not drink alcohol or use illicit drugs. family history includes Cancer in his mother; Diabetes in his mother; Heart disease in his father and mother; Hypertension in his father and mother; and Stroke in his mother. Allergies  Allergen Reactions  . Naproxen Sodium     REACTION: swelling      Review of Systems  Constitutional: Negative for fatigue.    Eyes: Negative for visual disturbance.  Respiratory: Negative for cough, chest tightness and shortness of breath.   Cardiovascular: Negative for chest pain, palpitations and leg swelling.  Neurological: Negative for dizziness, syncope, weakness, light-headedness and headaches.       Objective:   Physical Exam  Constitutional: He appears well-developed and well-nourished.  Neck: Neck supple. No thyromegaly present.  Cardiovascular: Normal rate and regular rhythm.   Pulmonary/Chest: Effort normal and breath sounds normal. No respiratory distress. He has no wheezes. He has no rales.  Musculoskeletal: He exhibits no edema.  Lymphadenopathy:    He has no cervical adenopathy.          Assessment & Plan:  #1 type 2 diabetes. Recheck A1c. If stable consider repeat in 6 months. Continue weight loss efforts #2 history of hyperlipidemia. Recheck lipids at followup.  #3 history of CAD. Done well since his bypass one year ago

## 2011-12-20 NOTE — Progress Notes (Signed)
Quick Note:  Left a message for pt to return call. ______ 

## 2012-02-16 ENCOUNTER — Telehealth: Payer: Self-pay | Admitting: Family Medicine

## 2012-02-16 MED ORDER — INSULIN LISPRO 100 UNIT/ML ~~LOC~~ SOLN: SUBCUTANEOUS | Status: DC

## 2012-02-16 NOTE — Telephone Encounter (Signed)
Pts spouse called and said that pt uses [facility] and needs to get a renewal on pt insulin lispro (HUMALOG) 100 UNIT/ML injection to [facility] mail order pharmacy.

## 2012-02-27 ENCOUNTER — Telehealth: Payer: Self-pay | Admitting: Family Medicine

## 2012-02-27 MED ORDER — INSULIN LISPRO 100 UNIT/ML ~~LOC~~ SOLN: SUBCUTANEOUS | Status: DC

## 2012-02-27 NOTE — Telephone Encounter (Signed)
Rx sent electronlcally today, pt wife informed Rx was also faxed on 10/18?  Not sure what happened?

## 2012-02-27 NOTE — Telephone Encounter (Signed)
Pts wife called and said that she has checked [facility] and the insulin lispro (HUMALOG) 100 UNIT/ML injection is still not there. Pls call in today.

## 2012-03-01 ENCOUNTER — Telehealth: Payer: Self-pay | Admitting: Family Medicine

## 2012-03-01 NOTE — Telephone Encounter (Signed)
Pt needs script sentfaxed to [facility] for BD Insulin syringes NDC [phone]   DX  code(s) Tests :3 times a day

## 2012-03-07 MED ORDER — "INSULIN SYRINGE-NEEDLE U-100 31G X 5/16"" 0.3 ML MISC": Status: DC

## 2012-03-07 NOTE — Telephone Encounter (Signed)
Wife informed syringes sent to [facility]

## 2012-03-12 ENCOUNTER — Telehealth: Payer: Self-pay | Admitting: Family Medicine

## 2012-03-12 MED ORDER — "INSULIN SYRINGE-NEEDLE U-100 31G X 5/16"" 0.3 ML MISC": Status: DC

## 2012-03-12 NOTE — Telephone Encounter (Signed)
Pts spouse called re: Insulin Syringe-Needle U-100 (B-D INSULIN SYRINGE) 31G X 5/16" 0.3 ML MISC. [facility] says that they did not rcv the prescription. Pts spouse said to call [facility] fax # and press Option 2, to speak to a representative and call it in directly. Pls notify spouse when done.

## 2012-03-12 NOTE — Telephone Encounter (Signed)
I called [facility],spoke with Jimmy.  He was not able to find the pt in their records, not be name, DOB, address, phone??  I informed wife Rx had been sent electronically, it was faxed earlier last week??  She requested a hard copy Rx for her to mail with a letter to [facility]

## 2012-03-13 ENCOUNTER — Telehealth: Payer: Self-pay | Admitting: Family Medicine

## 2012-03-13 MED ORDER — "INSULIN SYRINGE-NEEDLE U-100 31G X 5/16"" 0.3 ML MISC": Status: DC

## 2012-03-13 NOTE — Telephone Encounter (Signed)
Tristan Hughes, please reference call from 11/11. Wife called [facility]. They need you to fax another rx for the diabetic needles. This is exactly what they want on the rx: patient name, member ID: [id number], patient address, patient DOB, and the rx.  Rx needs to say: SURE-JECT INS SYR 1mL 10's 31g-5/16", QTY: 3 boxes.  Please fax script with all of this info to [facility] at: [phone]. Thank you. Wife stated that the rx from yesterday said "BD INS SYR", and they do not carry that. It must say what I've typed above.

## 2012-03-13 NOTE — Telephone Encounter (Signed)
This was done, printed as directed, will fax to number requested.

## 2012-06-21 ENCOUNTER — Ambulatory Visit: Payer: 59 | Admitting: Family Medicine

## 2012-07-03 ENCOUNTER — Encounter: Payer: Self-pay | Admitting: Family Medicine

## 2012-07-03 ENCOUNTER — Ambulatory Visit (INDEPENDENT_AMBULATORY_CARE_PROVIDER_SITE_OTHER): Payer: 59 | Admitting: Family Medicine

## 2012-07-03 VITALS — BP 150/70 | HR 64 | Temp 97.6°F | Wt 226.0 lb

## 2012-07-03 DIAGNOSIS — I2510 Atherosclerotic heart disease of native coronary artery without angina pectoris: Secondary | ICD-10-CM

## 2012-07-03 DIAGNOSIS — I10 Essential (primary) hypertension: Secondary | ICD-10-CM

## 2012-07-03 DIAGNOSIS — E119 Type 2 diabetes mellitus without complications: Secondary | ICD-10-CM

## 2012-07-03 LAB — BASIC METABOLIC PANEL
Calcium: 9.1 mg/dL (ref 8.4–10.5)
GFR: 81.12 mL/min (ref >60.00)
Sodium: 142 mEq/L (ref 135–145)

## 2012-07-03 LAB — LIPID PANEL
HDL: 36.4 mg/dL — ABNORMAL LOW (ref >39.00)
Triglycerides: 69 mg/dL (ref 0.0–149.0)
VLDL: 13.8 mg/dL (ref 0.0–40.0)

## 2012-07-03 LAB — HEPATIC FUNCTION PANEL
ALT: 30 U/L (ref 0–53)
AST: 26 U/L (ref 0–37)
Albumin: 3.9 g/dL (ref 3.5–5.2)

## 2012-07-03 LAB — HEMOGLOBIN A1C: Hgb A1c MFr Bld: 6.6 % — ABNORMAL HIGH (ref 4.6–6.5)

## 2012-07-03 NOTE — Progress Notes (Signed)
  Subjective:    Patient ID: Tristan Hughes, male    DOB: December 16, 1940, 72 y.o.   MRN: [id number]  HPI Medical followup. History of coronary artery disease. Stable. No recent chest pains. Somewhat inconsistent with exercise. Type 2 diabetes which is been well controlled. Last A1c 5.9%. Fasting blood sugars generally 90-130. He has increased his Humalog insulin 18 units 3 times daily with meals  Not monitoring blood pressure. Remains on furosemide, lisinopril, and metoprolol. Takes Crestor for hyperlipidemia. Compliant with all medications  Past Medical History  Diagnosis Date  . CAD 07/09/2009    Cath 12-02-10 90% left main, 90%LAD, 99% circumflex, 80% RCA  . COLONIC POLYPS, HX OF 07/09/2009    Qualifier: Diagnosis of  By: Gabriel Rung LPN, Harriett Sine    . DIAB W/O COMP TYPE II/UNS NOT STATED UNCNTRL 07/09/2009    Qualifier: Diagnosis of  By: Rita Ohara    . HYPERLIPIDEMIA 07/09/2009    Qualifier: Diagnosis of  By: Gabriel Rung LPN, Harriett Sine    . HYPERTENSION 07/09/2009    Qualifier: Diagnosis of  By: Gabriel Rung LPN, Harriett Sine    . OBSTRUCTIVE SLEEP APNEA 07/09/2009    Qualifier: Diagnosis of  By: Rita Ohara    . History of chicken pox    Past Surgical History  Procedure Laterality Date  . Cholecystectomy    . Biopsy breast    . Tonsillectomy    . Coronary angioplasty with stent placement    . Coronary artery bypass graft  8/12    CABG X 4 LIMA-LAD, SVG-PDB, SVG-OM/RAMUS   /    reports that he has never smoked. He has never used smokeless tobacco. He reports that he does not drink alcohol or use illicit drugs. family history includes Cancer in his mother; Diabetes in his mother; Heart disease in his father and mother; Hypertension in his father and mother; and Stroke in his mother. Allergies  Allergen Reactions  . Naproxen Sodium     REACTION: swelling      Review of Systems  Constitutional: Negative for fatigue.  Eyes: Negative for visual disturbance.  Respiratory: Negative for cough,  chest tightness and shortness of breath.   Cardiovascular: Negative for chest pain, palpitations and leg swelling.  Neurological: Negative for dizziness, syncope, weakness, light-headedness and headaches.       Objective:   Physical Exam  Constitutional: He appears well-developed and well-nourished. No distress.  HENT:  Head: Normocephalic and atraumatic.  Eyes: Conjunctivae and EOM are normal. Pupils are equal, round, and reactive to light.  Neck: Normal range of motion. Neck supple. No thyromegaly present.  Cardiovascular: Normal rate, regular rhythm and normal heart sounds.   Pulmonary/Chest: No respiratory distress. He has no wheezes. He has no rales.  Musculoskeletal: He exhibits edema.  Trace edema legs bilaterally  Lymphadenopathy:    He has no cervical adenopathy.  Skin:  Feet reveal no lesions  normal sensory function. No calluses.  Psychiatric: He has Tristan normal mood and affect.          Assessment & Plan:  #1history of type 2 diabetes. Good control. Recheck A1c. Work on weight loss establishing more consistent exercise #2 hypertension. Marginal control. Weight loss and reassess 3 months. Consider additional medication then if not better control #3 history of CAD. Recheck lipid and hepatic panel

## 2012-07-03 NOTE — Progress Notes (Signed)
Quick Note:  Left a message for return call. ______ 

## 2012-07-04 NOTE — Progress Notes (Signed)
Quick Note:  Pt wife informed ______ 

## 2012-08-02 ENCOUNTER — Other Ambulatory Visit: Payer: Self-pay | Admitting: Family Medicine

## 2012-08-15 ENCOUNTER — Encounter: Payer: Self-pay | Admitting: Family Medicine

## 2012-08-19 MED ORDER — AMLODIPINE BESYLATE 5 MG PO TABS: 5.0000 mg | ORAL_TABLET | Freq: Every day | ORAL | Status: DC

## 2012-08-20 ENCOUNTER — Encounter: Payer: Self-pay | Admitting: Family Medicine

## 2012-09-17 ENCOUNTER — Other Ambulatory Visit: Payer: Self-pay | Admitting: Family Medicine

## 2012-09-17 ENCOUNTER — Ambulatory Visit (INDEPENDENT_AMBULATORY_CARE_PROVIDER_SITE_OTHER): Payer: 59 | Admitting: Family Medicine

## 2012-09-17 ENCOUNTER — Encounter: Payer: Self-pay | Admitting: Family Medicine

## 2012-09-17 ENCOUNTER — Other Ambulatory Visit: Payer: Self-pay | Admitting: *Deleted

## 2012-09-17 VITALS — BP 130/64 | Temp 98.5°F | Wt 226.0 lb

## 2012-09-17 DIAGNOSIS — E119 Type 2 diabetes mellitus without complications: Secondary | ICD-10-CM

## 2012-09-17 DIAGNOSIS — I10 Essential (primary) hypertension: Secondary | ICD-10-CM

## 2012-09-17 MED ORDER — INSULIN LISPRO 100 UNIT/ML ~~LOC~~ SOLN: SUBCUTANEOUS | Status: DC

## 2012-09-17 NOTE — Progress Notes (Signed)
  Subjective:    Patient ID: Tristan Hughes, male    DOB: 1941/04/04, 72 y.o.   MRN: [id number]  HPI Followup hypertension Recently added amlodipine 5 mg daily. Blood pressures much improved since then. No headaches or edema issues. Denies recent chest pains. Medications reviewed and compliant with all.  Diabetes remained stable. Last A1c 6.6%. Due for repeat A1c in one month. Currently, his Humalog supplies not lasting full 3 months. Requesting refills. No recent hypoglycemia.  Past Medical History  Diagnosis Date  . CAD 07/09/2009    Cath 12-02-10 90% left main, 90%LAD, 99% circumflex, 80% RCA  . COLONIC POLYPS, HX OF 07/09/2009    Qualifier: Diagnosis of  By: Gabriel Rung LPN, Harriett Sine    . DIAB W/O COMP TYPE II/UNS NOT STATED UNCNTRL 07/09/2009    Qualifier: Diagnosis of  By: Rita Ohara    . HYPERLIPIDEMIA 07/09/2009    Qualifier: Diagnosis of  By: Gabriel Rung LPN, Harriett Sine    . HYPERTENSION 07/09/2009    Qualifier: Diagnosis of  By: Gabriel Rung LPN, Harriett Sine    . OBSTRUCTIVE SLEEP APNEA 07/09/2009    Qualifier: Diagnosis of  By: Rita Ohara    . History of chicken pox    Past Surgical History  Procedure Laterality Date  . Cholecystectomy    . Biopsy breast    . Tonsillectomy    . Coronary angioplasty with stent placement    . Coronary artery bypass graft  8/12    CABG X 4 LIMA-LAD, SVG-PDB, SVG-OM/RAMUS   /    reports that he has never smoked. He has never used smokeless tobacco. He reports that he does not drink alcohol or use illicit drugs. family history includes Cancer in his mother; Diabetes in his mother; Heart disease in his father and mother; Hypertension in his father and mother; and Stroke in his mother. Allergies  Allergen Reactions  . Naproxen Sodium     REACTION: swelling      Review of Systems  Constitutional: Negative for appetite change, fatigue and unexpected weight change.  Eyes: Negative for visual disturbance.  Respiratory: Negative for cough, chest tightness  and shortness of breath.   Cardiovascular: Negative for chest pain, palpitations and leg swelling.  Neurological: Negative for dizziness, syncope, weakness, light-headedness and headaches.       Objective:   Physical Exam  Constitutional: He appears well-developed and well-nourished. No distress.  Cardiovascular: Normal rate and regular rhythm.   Pulmonary/Chest: Effort normal and breath sounds normal. No respiratory distress. He has no wheezes. He has no rales.  Musculoskeletal: He exhibits no edema.          Assessment & Plan:  Hypertension. Improved and at goal. Continue current medications. Type 2 diabetes. Reorder Humalog with appropriate volume for 3 months supply. Recheck A1c in June as scheduled

## 2012-09-27 ENCOUNTER — Encounter: Payer: Self-pay | Admitting: Family Medicine

## 2012-09-27 MED ORDER — AMLODIPINE BESYLATE 5 MG PO TABS: 5.0000 mg | ORAL_TABLET | Freq: Every day | ORAL | Status: DC

## 2012-10-03 ENCOUNTER — Ambulatory Visit (INDEPENDENT_AMBULATORY_CARE_PROVIDER_SITE_OTHER): Payer: 59 | Admitting: Family Medicine

## 2012-10-03 ENCOUNTER — Encounter: Payer: Self-pay | Admitting: Family Medicine

## 2012-10-03 VITALS — BP 102/56 | HR 54 | Temp 97.8°F | Resp 18 | Wt 228.0 lb

## 2012-10-03 DIAGNOSIS — E119 Type 2 diabetes mellitus without complications: Secondary | ICD-10-CM

## 2012-10-03 DIAGNOSIS — R229 Localized swelling, mass and lump, unspecified: Secondary | ICD-10-CM

## 2012-10-03 LAB — HM DIABETES FOOT EXAM: HM Diabetic Foot Exam: NORMAL

## 2012-10-03 NOTE — Progress Notes (Signed)
  Subjective:    Patient ID: Tristan Hughes, male    DOB: 05-08-1940, 72 y.o.   MRN: [id number]  HPI Followup type 2 diabetes Last A1c 6.6%. Blood sugars been very stable. No recent hypoglycemia. He remains on accommodation of long-acting and short-acting insulin. Had eye exam back in April. No retinopathy changes. No history of neuropathy. No recent chest pains. Compliant with all medications  Left ear nodular lesion present for probably over one year. Occasionally scabs over. Nonpainful. No past history of skin cancer  Past Medical History  Diagnosis Date  . CAD 07/09/2009    Cath 12-02-10 90% left main, 90%LAD, 99% circumflex, 80% RCA  . COLONIC POLYPS, HX OF 07/09/2009    Qualifier: Diagnosis of  By: Gabriel Rung LPN, Harriett Sine    . DIAB W/O COMP TYPE II/UNS NOT STATED UNCNTRL 07/09/2009    Qualifier: Diagnosis of  By: Rita Ohara    . HYPERLIPIDEMIA 07/09/2009    Qualifier: Diagnosis of  By: Gabriel Rung LPN, Harriett Sine    . HYPERTENSION 07/09/2009    Qualifier: Diagnosis of  By: Gabriel Rung LPN, Harriett Sine    . OBSTRUCTIVE SLEEP APNEA 07/09/2009    Qualifier: Diagnosis of  By: Rita Ohara    . History of chicken pox    Past Surgical History  Procedure Laterality Date  . Cholecystectomy    . Biopsy breast    . Tonsillectomy    . Coronary angioplasty with stent placement    . Coronary artery bypass graft  8/12    CABG X 4 LIMA-LAD, SVG-PDB, SVG-OM/RAMUS   /    reports that he has never smoked. He has never used smokeless tobacco. He reports that he does not drink alcohol or use illicit drugs. family history includes Cancer in his mother; Diabetes in his mother; Heart disease in his father and mother; Hypertension in his father and mother; and Stroke in his mother. Allergies  Allergen Reactions  . Naproxen Sodium     REACTION: swelling      Review of Systems  Constitutional: Negative for fatigue and unexpected weight change.  Eyes: Negative for visual disturbance.  Respiratory: Negative  for cough, chest tightness and shortness of breath.   Cardiovascular: Negative for chest pain, palpitations and leg swelling.  Neurological: Negative for dizziness, syncope, weakness, light-headedness and headaches.       Objective:   Physical Exam  Constitutional: He appears well-developed and well-nourished.  HENT:  Left ear reveals approximately 9 mm nodular lesion with slightly crusted center  Neck: Neck supple.  Cardiovascular: Normal rate and regular rhythm.   Pulmonary/Chest: Effort normal and breath sounds normal. No respiratory distress. He has no wheezes. He has no rales.  Musculoskeletal: He exhibits no edema.  Lymphadenopathy:    He has no cervical adenopathy.  Skin:  Feet reveal no skin lesions. Good distal foot pulses. Good capillary refill. No calluses. Normal sensation with monofilament testing           Assessment & Plan:  #1 type 2 diabetes. History of good control. Recent eye exam normal. Recheck A1c. If remains controlled six-month followup #2 nodular lesion left ear. Rule out squamous cell carcinoma. Dermatology referral. Discussed sun protection

## 2012-12-04 ENCOUNTER — Other Ambulatory Visit: Payer: Self-pay

## 2012-12-06 ENCOUNTER — Other Ambulatory Visit: Payer: Self-pay | Admitting: Family Medicine

## 2013-03-06 ENCOUNTER — Other Ambulatory Visit: Payer: Self-pay

## 2013-04-04 ENCOUNTER — Ambulatory Visit: Payer: 59 | Admitting: Family Medicine

## 2013-04-07 ENCOUNTER — Ambulatory Visit: Payer: 59 | Admitting: Family Medicine

## 2013-04-17 ENCOUNTER — Encounter: Payer: Self-pay | Admitting: Family Medicine

## 2013-04-17 ENCOUNTER — Ambulatory Visit (INDEPENDENT_AMBULATORY_CARE_PROVIDER_SITE_OTHER): Payer: 59 | Admitting: Family Medicine

## 2013-04-17 VITALS — BP 120/64 | HR 54 | Temp 97.4°F | Wt 231.0 lb

## 2013-04-17 DIAGNOSIS — Z23 Encounter for immunization: Secondary | ICD-10-CM

## 2013-04-17 DIAGNOSIS — I10 Essential (primary) hypertension: Secondary | ICD-10-CM

## 2013-04-17 DIAGNOSIS — E119 Type 2 diabetes mellitus without complications: Secondary | ICD-10-CM

## 2013-04-17 LAB — BASIC METABOLIC PANEL
BUN: 20 mg/dL (ref 6–23)
CO2: 29 mEq/L (ref 19–32)
Chloride: 104 mEq/L (ref 96–112)
Creatinine, Ser: 1 mg/dL (ref 0.4–1.5)
Glucose, Bld: 118 mg/dL — ABNORMAL HIGH (ref 70–99)
Potassium: 4.2 mEq/L (ref 3.5–5.1)

## 2013-04-17 NOTE — Progress Notes (Signed)
Subjective:    Patient ID: A Tristan Hughes, male    DOB: Jul 30, 1940, 72 y.o.   MRN: [id number]  HPI Patient here for follow up He has history of CAD with bypass last year. He's done extremely well since then. He has type 2 diabetes, hypertension, hyperlipidemia, obstructive sleep apnea. Blood sugars been well controlled. No recent hypoglycemia. He remains on insulin and has no symptoms of hyperglycemia. A1c is been well controlled. He gets eye exams every April. No history of any retinopathy or neuropathy.  Hypertension treated with lisinopril, amlodipine, metoprolol, and furosemide. He has some chronic peripheral edema which is unchanged. Hyperlipidemia treated with Crestor and Zetia.  Patient declines flu vaccine. Also needs Pneumovax. He does agree to this.  Past Medical History  Diagnosis Date  . CAD 07/09/2009    Cath 12-02-10 90% left main, 90%LAD, 99% circumflex, 80% RCA  . COLONIC POLYPS, HX OF 07/09/2009    Qualifier: Diagnosis of  By: Gabriel Rung LPN, Harriett Sine    . DIAB W/O COMP TYPE II/UNS NOT STATED UNCNTRL 07/09/2009    Qualifier: Diagnosis of  By: Rita Ohara    . HYPERLIPIDEMIA 07/09/2009    Qualifier: Diagnosis of  By: Gabriel Rung LPN, Harriett Sine    . HYPERTENSION 07/09/2009    Qualifier: Diagnosis of  By: Gabriel Rung LPN, Harriett Sine    . OBSTRUCTIVE SLEEP APNEA 07/09/2009    Qualifier: Diagnosis of  By: Rita Ohara    . History of chicken pox    Past Surgical History  Procedure Laterality Date  . Cholecystectomy    . Biopsy breast    . Tonsillectomy    . Coronary angioplasty with stent placement    . Coronary artery bypass graft  8/12    CABG X 4 LIMA-LAD, SVG-PDB, SVG-OM/RAMUS   /    reports that he has never smoked. He has never used smokeless tobacco. He reports that he does not drink alcohol or use illicit drugs. family history includes Cancer in his mother; Diabetes in his mother; Heart disease in his father and mother; Hypertension in his father and mother; Stroke in his  mother. Allergies  Allergen Reactions  . Naproxen Sodium     REACTION: swelling      Review of Systems  Constitutional: Negative for fatigue.  Eyes: Negative for visual disturbance.  Respiratory: Negative for cough, chest tightness and shortness of breath.   Cardiovascular: Negative for chest pain, palpitations and leg swelling.  Endocrine: Negative for polydipsia and polyuria.  Neurological: Negative for dizziness, syncope, weakness, light-headedness and headaches.       Objective:   Physical Exam  Constitutional: He is oriented to person, place, and time. He appears well-developed and well-nourished.  HENT:  Right Ear: External ear normal.  Left Ear: External ear normal.  Mouth/Throat: Oropharynx is clear and moist.  Eyes: Pupils are equal, round, and reactive to light.  Neck: Neck supple. No thyromegaly present.  Cardiovascular: Normal rate and regular rhythm.   Pulmonary/Chest: Effort normal and breath sounds normal. No respiratory distress. He has no wheezes. He has no rales.  Musculoskeletal: He exhibits no edema.  Neurological: He is alert and oriented to person, place, and time.  Skin:  Feet reveal no skin lesions. Good distal foot pulses. Good capillary refill. No calluses. Normal sensation with monofilament testing           Assessment & Plan:  #1 type 2 diabetes. History of good control. Repeat A1c #2 hyperlipidemia. Well controlled last visit. Plan repeat in 6  months #3 hypertension adequate control #4 health maintenance. Prevnar 13 given. Patient declines flu vaccine. He is aware of risks of refusal

## 2013-04-17 NOTE — Progress Notes (Signed)
Pre visit review using our clinic review tool, if applicable. No additional management support is needed unless otherwise documented below in the visit note. 

## 2013-04-17 NOTE — Addendum Note (Signed)
Addended by: Shelby Dubin E on: 04/17/2013 10:10 AM   Modules accepted: Orders

## 2013-05-13 ENCOUNTER — Other Ambulatory Visit: Payer: Self-pay | Admitting: Family Medicine

## 2013-08-08 ENCOUNTER — Encounter: Payer: Self-pay | Admitting: Family Medicine

## 2013-08-08 ENCOUNTER — Other Ambulatory Visit: Payer: Self-pay

## 2013-08-08 ENCOUNTER — Other Ambulatory Visit: Payer: Self-pay | Admitting: Family Medicine

## 2013-08-08 MED ORDER — "INSULIN SYRINGE-NEEDLE U-100 31G X 5/16"" 0.3 ML MISC": Status: DC

## 2013-08-08 MED ORDER — INSULIN LISPRO 100 UNIT/ML ~~LOC~~ SOLN: SUBCUTANEOUS | Status: DC

## 2013-08-21 ENCOUNTER — Other Ambulatory Visit: Payer: Self-pay | Admitting: Family Medicine

## 2013-08-26 LAB — HM DIABETES EYE EXAM

## 2013-09-13 ENCOUNTER — Other Ambulatory Visit: Payer: Self-pay | Admitting: Family Medicine

## 2013-10-16 ENCOUNTER — Encounter: Payer: Self-pay | Admitting: Family Medicine

## 2013-10-16 ENCOUNTER — Ambulatory Visit (INDEPENDENT_AMBULATORY_CARE_PROVIDER_SITE_OTHER): Payer: 59 | Admitting: Family Medicine

## 2013-10-16 VITALS — BP 122/70 | Temp 98.0°F | Ht 67.5 in | Wt 225.0 lb

## 2013-10-16 DIAGNOSIS — I2510 Atherosclerotic heart disease of native coronary artery without angina pectoris: Secondary | ICD-10-CM

## 2013-10-16 DIAGNOSIS — I10 Essential (primary) hypertension: Secondary | ICD-10-CM

## 2013-10-16 DIAGNOSIS — M545 Low back pain, unspecified: Secondary | ICD-10-CM

## 2013-10-16 DIAGNOSIS — E119 Type 2 diabetes mellitus without complications: Secondary | ICD-10-CM

## 2013-10-16 DIAGNOSIS — E785 Hyperlipidemia, unspecified: Secondary | ICD-10-CM

## 2013-10-16 DIAGNOSIS — E669 Obesity, unspecified: Secondary | ICD-10-CM | POA: Insufficient documentation

## 2013-10-16 LAB — HEPATIC FUNCTION PANEL
ALK PHOS: 46 U/L (ref 39–117)
ALT: 27 U/L (ref 0–53)
AST: 30 U/L (ref 0–37)
Albumin: 4.3 g/dL (ref 3.5–5.2)
BILIRUBIN TOTAL: 1 mg/dL (ref 0.2–1.2)
Bilirubin, Direct: 0.2 mg/dL (ref 0.0–0.3)
Total Protein: 7.4 g/dL (ref 6.0–8.3)

## 2013-10-16 LAB — LIPID PANEL
CHOLESTEROL: 101 mg/dL (ref 0–200)
HDL: 42.2 mg/dL (ref >39.00)
LDL CALC: 42 mg/dL (ref 0–99)
NonHDL: 58.8
Total CHOL/HDL Ratio: 2
Triglycerides: 82 mg/dL (ref 0.0–149.0)
VLDL: 16.4 mg/dL (ref 0.0–40.0)

## 2013-10-16 LAB — BASIC METABOLIC PANEL
BUN: 20 mg/dL (ref 6–23)
CALCIUM: 9.6 mg/dL (ref 8.4–10.5)
CHLORIDE: 103 meq/L (ref 96–112)
CO2: 32 mEq/L (ref 19–32)
CREATININE: 1.1 mg/dL (ref 0.4–1.5)
GFR: 72.96 mL/min (ref >60.00)
Glucose, Bld: 98 mg/dL (ref 70–99)
Potassium: 4.6 mEq/L (ref 3.5–5.1)
Sodium: 141 mEq/L (ref 135–145)

## 2013-10-16 LAB — HEMOGLOBIN A1C: Hgb A1c MFr Bld: 6.6 % — ABNORMAL HIGH (ref 4.6–6.5)

## 2013-10-16 NOTE — Progress Notes (Signed)
Pre visit review using our clinic review tool, if applicable. No additional management support is needed unless otherwise documented below in the visit note. 

## 2013-10-16 NOTE — Progress Notes (Signed)
Subjective:    Patient ID: Tristan Hughes, male    DOB: 1940-09-10, 73 y.o.   MRN: [id number]  HPI Medical followup. Patient has history of CAD, type 2 diabetes, hypertension, hyperlipidemia. He's had Tristan couple of recent episodes of hypoglycemia but not consistently. These occurred at night. He takes Lantus along with mealtime short-acting insulin. Medications reviewed. No symptoms of hyperglycemia. Blood pressures been well controlled. No recent chest pains. He had eye exam last week reportedly normal.  New problem of right lumbar back pain. Onset about one year ago. No reported injury. No radiculopathy symptoms. Location is right lower lumbar. Worse with movement. He has not really taken any medications. No appetite or weight changes. No fevers or chills. No dysuria.  Past Medical History  Diagnosis Date  . CAD 07/09/2009    Cath 12-02-10 90% left main, 90%LAD, 99% circumflex, 80% RCA  . COLONIC POLYPS, HX OF 07/09/2009    Qualifier: Diagnosis of  By: Valma Cava LPN, [name]    . DIAB W/O COMP TYPE II/UNS NOT STATED UNCNTRL 07/09/2009    Qualifier: Diagnosis of  By: Joyce Gross    . HYPERLIPIDEMIA 07/09/2009    Qualifier: Diagnosis of  By: Valma Cava LPN, [name]    . HYPERTENSION 07/09/2009    Qualifier: Diagnosis of  By: Valma Cava LPN, [name]    . OBSTRUCTIVE SLEEP APNEA 07/09/2009    Qualifier: Diagnosis of  By: Joyce Gross    . History of chicken pox    Past Surgical History  Procedure Laterality Date  . Cholecystectomy    . Biopsy breast    . Tonsillectomy    . Coronary angioplasty with stent placement    . Coronary artery bypass graft  8/12    CABG X 4 LIMA-LAD, SVG-PDB, SVG-OM/RAMUS   /    reports that he has never smoked. He has never used smokeless tobacco. He reports that he does not drink alcohol or use illicit drugs. family history includes Cancer in his mother; Diabetes in his mother; Heart disease in his father and mother; Hypertension in his father and mother; Stroke in his  mother. Allergies  Allergen Reactions  . Naproxen Sodium     REACTION: swelling      Review of Systems  Constitutional: Negative for fatigue.  Eyes: Negative for visual disturbance.  Respiratory: Negative for cough, chest tightness and shortness of breath.   Cardiovascular: Negative for chest pain, palpitations and leg swelling.  Musculoskeletal: Positive for back pain.  Neurological: Negative for dizziness, syncope, weakness, light-headedness and headaches.       Objective:   Physical Exam  Constitutional: He is oriented to person, place, and time. He appears well-developed and well-nourished.  HENT:  Right Ear: External ear normal.  Left Ear: External ear normal.  Mouth/Throat: Oropharynx is clear and moist.  Eyes: Pupils are equal, round, and reactive to light.  Neck: Neck supple. No thyromegaly present.  Cardiovascular: Normal rate and regular rhythm.   Pulmonary/Chest: Effort normal and breath sounds normal. No respiratory distress. He has no wheezes. He has no rales.  Musculoskeletal: He exhibits no edema.  SLRs are negative  Neurological: He is alert and oriented to person, place, and time. He has normal reflexes.  Full strength lower extremities.          Assessment & Plan:  #1 type 2 diabetes. Recheck A1c. History of good control. #2 hypertension which is stable and at goal. Continue current medications  #3 history of CAD with previous bypass. Check  lipid and hepatic panel. #4 somewhat chronic right lumbar back pain. nonfocala exam.  Set up trial physical therapy.  If no improvement with that over the next few weeks consider imaging

## 2013-10-16 NOTE — Patient Instructions (Signed)

## 2013-10-17 ENCOUNTER — Encounter: Payer: Self-pay | Admitting: Family Medicine

## 2013-10-17 ENCOUNTER — Telehealth: Payer: Self-pay | Admitting: Family Medicine

## 2013-10-17 NOTE — Telephone Encounter (Signed)
Relevant patient education assigned to patient using Emmi. ° °

## 2013-10-21 ENCOUNTER — Ambulatory Visit: Payer: Medicare Other | Attending: Family Medicine

## 2013-10-21 DIAGNOSIS — M545 Low back pain, unspecified: Secondary | ICD-10-CM | POA: Diagnosis not present

## 2013-10-21 DIAGNOSIS — IMO0001 Reserved for inherently not codable concepts without codable children: Secondary | ICD-10-CM | POA: Diagnosis present

## 2013-10-21 DIAGNOSIS — E119 Type 2 diabetes mellitus without complications: Secondary | ICD-10-CM | POA: Insufficient documentation

## 2013-10-21 DIAGNOSIS — I10 Essential (primary) hypertension: Secondary | ICD-10-CM | POA: Insufficient documentation

## 2013-10-21 DIAGNOSIS — R5381 Other malaise: Secondary | ICD-10-CM | POA: Diagnosis not present

## 2013-10-23 ENCOUNTER — Ambulatory Visit: Payer: Medicare Other

## 2013-10-24 ENCOUNTER — Ambulatory Visit: Payer: Medicare Other | Admitting: Physical Therapy

## 2013-10-24 DIAGNOSIS — IMO0001 Reserved for inherently not codable concepts without codable children: Secondary | ICD-10-CM | POA: Diagnosis not present

## 2013-10-28 ENCOUNTER — Ambulatory Visit: Payer: Medicare Other | Admitting: Physical Therapy

## 2013-10-28 DIAGNOSIS — IMO0001 Reserved for inherently not codable concepts without codable children: Secondary | ICD-10-CM | POA: Diagnosis not present

## 2013-10-30 ENCOUNTER — Ambulatory Visit: Payer: Medicare Other | Attending: Family Medicine | Admitting: Physical Therapy

## 2013-10-30 DIAGNOSIS — IMO0001 Reserved for inherently not codable concepts without codable children: Secondary | ICD-10-CM | POA: Insufficient documentation

## 2013-11-04 ENCOUNTER — Ambulatory Visit: Payer: Medicare Other | Admitting: Physical Therapy

## 2013-11-04 DIAGNOSIS — IMO0001 Reserved for inherently not codable concepts without codable children: Secondary | ICD-10-CM | POA: Diagnosis not present

## 2013-11-06 ENCOUNTER — Ambulatory Visit: Payer: Medicare Other | Admitting: Physical Therapy

## 2013-11-06 DIAGNOSIS — IMO0001 Reserved for inherently not codable concepts without codable children: Secondary | ICD-10-CM | POA: Diagnosis not present

## 2013-11-11 ENCOUNTER — Ambulatory Visit: Payer: Medicare Other | Admitting: Physical Therapy

## 2013-11-11 DIAGNOSIS — IMO0001 Reserved for inherently not codable concepts without codable children: Secondary | ICD-10-CM | POA: Diagnosis not present

## 2013-11-13 ENCOUNTER — Ambulatory Visit: Payer: Medicare Other

## 2013-11-13 DIAGNOSIS — IMO0001 Reserved for inherently not codable concepts without codable children: Secondary | ICD-10-CM | POA: Diagnosis not present

## 2013-11-15 ENCOUNTER — Other Ambulatory Visit: Payer: Self-pay | Admitting: Family Medicine

## 2013-11-18 ENCOUNTER — Ambulatory Visit: Payer: Medicare Other | Admitting: Physical Therapy

## 2013-11-18 DIAGNOSIS — IMO0001 Reserved for inherently not codable concepts without codable children: Secondary | ICD-10-CM | POA: Diagnosis not present

## 2013-11-20 ENCOUNTER — Ambulatory Visit: Payer: Medicare Other

## 2013-11-20 DIAGNOSIS — IMO0001 Reserved for inherently not codable concepts without codable children: Secondary | ICD-10-CM | POA: Diagnosis not present

## 2014-01-08 ENCOUNTER — Other Ambulatory Visit: Payer: Self-pay

## 2014-01-08 MED ORDER — INSULIN GLARGINE 100 UNIT/ML ~~LOC~~ SOLN: SUBCUTANEOUS | Status: DC

## 2014-04-17 ENCOUNTER — Encounter: Payer: Self-pay | Admitting: Family Medicine

## 2014-04-17 ENCOUNTER — Ambulatory Visit (INDEPENDENT_AMBULATORY_CARE_PROVIDER_SITE_OTHER): Payer: Medicare Other | Admitting: Family Medicine

## 2014-04-17 VITALS — BP 122/70 | HR 56 | Temp 97.3°F | Wt 229.0 lb

## 2014-04-17 DIAGNOSIS — I25810 Atherosclerosis of coronary artery bypass graft(s) without angina pectoris: Secondary | ICD-10-CM

## 2014-04-17 DIAGNOSIS — E119 Type 2 diabetes mellitus without complications: Secondary | ICD-10-CM

## 2014-04-17 DIAGNOSIS — E785 Hyperlipidemia, unspecified: Secondary | ICD-10-CM

## 2014-04-17 DIAGNOSIS — I10 Essential (primary) hypertension: Secondary | ICD-10-CM

## 2014-04-17 LAB — HEMOGLOBIN A1C: Hgb A1c MFr Bld: 6.7 % — ABNORMAL HIGH (ref 4.6–6.5)

## 2014-04-17 NOTE — Progress Notes (Signed)
Subjective:    Patient ID: Tristan Hughes, male    DOB: December 03, 1940, 73 y.o.   MRN: [id number]  HPI   Patient seen for medical follow-up. He has history of obesity, type 2 diabetes, hypertension, hyperlipidemia, CAD with coronary artery bypass graft 2012. He's done extremely well with secondary prevention since then. He has not established with local cardiologist. No chest pains. Very compliant with therapy. Medications reviewed with no recent changes. Denies any side effects.  Type 2 diabetes with history of excellent control. Insulin regimen reviewed. No recent hypoglycemia. Fasting blood sugars consistently less than 130.  Past Medical History  Diagnosis Date  . CAD 07/09/2009    Cath 12-02-10 90% left main, 90%LAD, 99% circumflex, 80% RCA  . COLONIC POLYPS, HX OF 07/09/2009    Qualifier: Diagnosis of  By: Valma Cava LPN, [name]    . DIAB W/O COMP TYPE II/UNS NOT STATED UNCNTRL 07/09/2009    Qualifier: Diagnosis of  By: Joyce Gross    . HYPERLIPIDEMIA 07/09/2009    Qualifier: Diagnosis of  By: Valma Cava LPN, [name]    . HYPERTENSION 07/09/2009    Qualifier: Diagnosis of  By: Valma Cava LPN, [name]    . OBSTRUCTIVE SLEEP APNEA 07/09/2009    Qualifier: Diagnosis of  By: Joyce Gross    . History of chicken pox    Past Surgical History  Procedure Laterality Date  . Cholecystectomy    . Biopsy breast    . Tonsillectomy    . Coronary angioplasty with stent placement    . Coronary artery bypass graft  8/12    CABG X 4 LIMA-LAD, SVG-PDB, SVG-OM/RAMUS   /    reports that he has never smoked. He has never used smokeless tobacco. He reports that he does not drink alcohol or use illicit drugs. family history includes Cancer in his mother; Diabetes in his mother; Heart disease in his father and mother; Hypertension in his father and mother; Stroke in his mother. Allergies  Allergen Reactions  . Naproxen Sodium     REACTION: swelling      Review of Systems  Constitutional: Negative for  fatigue.  Eyes: Negative for visual disturbance.  Respiratory: Negative for cough, chest tightness and shortness of breath.   Cardiovascular: Negative for chest pain, palpitations and leg swelling.  Endocrine: Negative for polydipsia and polyuria.  Neurological: Negative for dizziness, syncope, weakness, light-headedness and headaches.       Objective:   Physical Exam  Constitutional: He is oriented to person, place, and time. He appears well-developed and well-nourished.  HENT:  Right Ear: External ear normal.  Left Ear: External ear normal.  Mouth/Throat: Oropharynx is clear and moist.  Eyes: Pupils are equal, round, and reactive to light.  Neck: Neck supple. No thyromegaly present.  Cardiovascular: Normal rate and regular rhythm.   Pulmonary/Chest: Effort normal and breath sounds normal. No respiratory distress. He has no wheezes. He has no rales.  Musculoskeletal: He exhibits no edema.  Neurological: He is alert and oriented to person, place, and time.  Skin:  Feet reveal no skin lesions. Good distal foot pulses. Good capillary refill. No calluses. Normal sensation with monofilament testing           Assessment & Plan:  #1 type 2 diabetes. History of excellent control. Recheck A1c #2 hypertension stable and at goal #3 history of CAD. We'll set up referral to be established with local cardiologist, though he has not had any recent concerns. #4 dyslipidemia. Recent lipids in June  at goal. We will plan to check these yearly. #5 flu vaccine recommended and patient declines

## 2014-04-17 NOTE — Progress Notes (Signed)
Pre visit review using our clinic review tool, if applicable. No additional management support is needed unless otherwise documented below in the visit note. 

## 2014-05-07 ENCOUNTER — Telehealth: Payer: Self-pay

## 2014-05-07 NOTE — Telephone Encounter (Signed)
LVM with medical records requesting copy of eye exam from 08/26/13.

## 2014-05-15 NOTE — Telephone Encounter (Signed)
Pt needs an AWV for 2016. Asked for 15 min follow up appt to be changed to a 30 min AWV instead.

## 2014-05-18 ENCOUNTER — Other Ambulatory Visit: Payer: Self-pay | Admitting: Internal Medicine

## 2014-05-18 ENCOUNTER — Other Ambulatory Visit: Payer: Self-pay | Admitting: Family Medicine

## 2014-05-25 ENCOUNTER — Encounter: Payer: Self-pay | Admitting: Family Medicine

## 2014-05-26 ENCOUNTER — Other Ambulatory Visit: Payer: Self-pay

## 2014-05-26 ENCOUNTER — Encounter: Payer: Self-pay | Admitting: Family Medicine

## 2014-05-26 ENCOUNTER — Other Ambulatory Visit: Payer: Self-pay | Admitting: *Deleted

## 2014-05-26 MED ORDER — EZETIMIBE 10 MG PO TABS: 10.0000 mg | ORAL_TABLET | Freq: Every day | ORAL | Status: DC

## 2014-05-27 ENCOUNTER — Other Ambulatory Visit: Payer: Self-pay

## 2014-05-27 MED ORDER — EZETIMIBE 10 MG PO TABS: 10.0000 mg | ORAL_TABLET | Freq: Every day | ORAL | Status: DC

## 2014-07-22 ENCOUNTER — Other Ambulatory Visit: Payer: Self-pay | Admitting: *Deleted

## 2014-07-22 MED ORDER — AMLODIPINE BESYLATE 5 MG PO TABS: 5.0000 mg | ORAL_TABLET | Freq: Every day | ORAL | Status: DC

## 2014-07-22 NOTE — Telephone Encounter (Signed)
Refill request was faxed from Tristan Hughes for Amlodipine 5mg -take 1 tablet daily #90.  Rx done.

## 2014-07-27 ENCOUNTER — Other Ambulatory Visit: Payer: Self-pay | Admitting: Family Medicine

## 2014-10-01 ENCOUNTER — Other Ambulatory Visit: Payer: Self-pay | Admitting: Family Medicine

## 2014-10-16 ENCOUNTER — Ambulatory Visit (INDEPENDENT_AMBULATORY_CARE_PROVIDER_SITE_OTHER): Payer: Medicare Other | Admitting: Family Medicine

## 2014-10-16 ENCOUNTER — Encounter: Payer: Self-pay | Admitting: Family Medicine

## 2014-10-16 ENCOUNTER — Ambulatory Visit: Payer: Medicare Other | Admitting: Family Medicine

## 2014-10-16 VITALS — BP 120/70 | HR 56 | Temp 97.8°F | Wt 224.0 lb

## 2014-10-16 DIAGNOSIS — E785 Hyperlipidemia, unspecified: Secondary | ICD-10-CM

## 2014-10-16 DIAGNOSIS — I10 Essential (primary) hypertension: Secondary | ICD-10-CM | POA: Diagnosis not present

## 2014-10-16 DIAGNOSIS — E119 Type 2 diabetes mellitus without complications: Secondary | ICD-10-CM | POA: Diagnosis not present

## 2014-10-16 LAB — BASIC METABOLIC PANEL
BUN: 17 mg/dL (ref 6–23)
CALCIUM: 9.7 mg/dL (ref 8.4–10.5)
CO2: 33 meq/L — AB (ref 19–32)
CREATININE: 1.17 mg/dL (ref 0.40–1.50)
Chloride: 100 mEq/L (ref 96–112)
GFR: 64.92 mL/min (ref >60.00)
GLUCOSE: 133 mg/dL — AB (ref 70–99)
Potassium: 4.6 mEq/L (ref 3.5–5.1)
Sodium: 138 mEq/L (ref 135–145)

## 2014-10-16 LAB — HEPATIC FUNCTION PANEL
ALBUMIN: 4.2 g/dL (ref 3.5–5.2)
ALK PHOS: 43 U/L (ref 39–117)
ALT: 23 U/L (ref 0–53)
AST: 30 U/L (ref 0–37)
Bilirubin, Direct: 0.3 mg/dL (ref 0.0–0.3)
TOTAL PROTEIN: 6.9 g/dL (ref 6.0–8.3)
Total Bilirubin: 0.9 mg/dL (ref 0.2–1.2)

## 2014-10-16 LAB — HEMOGLOBIN A1C: HEMOGLOBIN A1C: 6.1 % (ref 4.6–6.5)

## 2014-10-16 LAB — LIPID PANEL
CHOL/HDL RATIO: 3
Cholesterol: 97 mg/dL (ref 0–200)
HDL: 37.7 mg/dL — AB (ref >39.00)
LDL CALC: 45 mg/dL (ref 0–99)
NONHDL: 59.3
TRIGLYCERIDES: 71 mg/dL (ref 0.0–149.0)
VLDL: 14.2 mg/dL (ref 0.0–40.0)

## 2014-10-16 MED ORDER — FUROSEMIDE 20 MG PO TABS: 20.0000 mg | ORAL_TABLET | Freq: Two times a day (BID) | ORAL | Status: DC

## 2014-10-16 NOTE — Progress Notes (Signed)
Pre visit review using our clinic review tool, if applicable. No additional management support is needed unless otherwise documented below in the visit note. 

## 2014-10-16 NOTE — Progress Notes (Signed)
Subjective:    Patient ID: Tristan Hughes, male    DOB: 09-23-1940, 74 y.o.   MRN: [id number]  HPI Patient seen for medical follow-up. He has history of CAD, type 2 diabetes, hypertension, hyperlipidemia, obstructive sleep apnea. Done very well overall. No specific complaints  Type 2 diabetes. Blood sugars at home very stable. No hypoglycemia. Remains on metformin and combination of long-acting and short-acting insulin. Last A1c 6.7%. Recent eye exam reportedly normal  History of CAD. No recent chest pains. He remains on Crestor and Zetia for hyperlipidemia  Hypertension is been very stable. No dizziness. Remains on metoprolol, lisinopril, amlodipine, and furosemide. No recent leg edema and he requests tapering back Lasix  Past Medical History  Diagnosis Date  . CAD 07/09/2009    Cath 12-02-10 90% left main, 90%LAD, 99% circumflex, 80% RCA  . COLONIC POLYPS, HX OF 07/09/2009    Qualifier: Diagnosis of  By: Valma Cava LPN, [name]    . DIAB W/O COMP TYPE II/UNS NOT STATED UNCNTRL 07/09/2009    Qualifier: Diagnosis of  By: Joyce Gross    . HYPERLIPIDEMIA 07/09/2009    Qualifier: Diagnosis of  By: Valma Cava LPN, [name]    . HYPERTENSION 07/09/2009    Qualifier: Diagnosis of  By: Valma Cava LPN, [name]    . OBSTRUCTIVE SLEEP APNEA 07/09/2009    Qualifier: Diagnosis of  By: Joyce Gross    . History of chicken pox    Past Surgical History  Procedure Laterality Date  . Cholecystectomy    . Biopsy breast    . Tonsillectomy    . Coronary angioplasty with stent placement    . Coronary artery bypass graft  8/12    CABG X 4 LIMA-LAD, SVG-PDB, SVG-OM/RAMUS   /    reports that he has never smoked. He has never used smokeless tobacco. He reports that he does not drink alcohol or use illicit drugs. family history includes Cancer in his mother; Diabetes in his mother; Heart disease in his father and mother; Hypertension in his father and mother; Stroke in his mother. Allergies  Allergen Reactions  .  Naproxen Sodium     REACTION: swelling      Review of Systems  Constitutional: Negative for chills, appetite change, fatigue and unexpected weight change.  Eyes: Negative for visual disturbance.  Respiratory: Negative for cough, chest tightness and shortness of breath.   Cardiovascular: Negative for chest pain, palpitations and leg swelling.  Gastrointestinal: Negative for abdominal pain.  Endocrine: Negative for polydipsia and polyuria.  Neurological: Negative for dizziness, syncope, weakness, light-headedness and headaches.       Objective:   Physical Exam  Constitutional: He is oriented to person, place, and time. He appears well-developed and well-nourished.  HENT:  Right Ear: External ear normal.  Left Ear: External ear normal.  Mouth/Throat: Oropharynx is clear and moist.  Eyes: Pupils are equal, round, and reactive to light.  Neck: Neck supple. No thyromegaly present.  Cardiovascular: Normal rate and regular rhythm.   Pulmonary/Chest: Effort normal and breath sounds normal. No respiratory distress. He has no wheezes. He has no rales.  Musculoskeletal: He exhibits no edema.  Neurological: He is alert and oriented to person, place, and time.          Assessment & Plan:  #1 type 2 diabetes. History of excellent control. Recheck A1c.  Continue with yearly eye exam #2 hypertension. Stable and at goal. Continue current medications #3 hyperlipidemia. Repeat lipid and hepatic panel #4 history of bilateral leg  edema. Patient will try reducing furosemide to 20 mg once daily.

## 2014-12-29 ENCOUNTER — Other Ambulatory Visit: Payer: Self-pay | Admitting: Family Medicine

## 2015-01-07 ENCOUNTER — Other Ambulatory Visit: Payer: Self-pay | Admitting: Family Medicine

## 2015-01-26 ENCOUNTER — Encounter: Payer: Self-pay | Admitting: Family Medicine

## 2015-01-27 MED ORDER — LISINOPRIL 40 MG PO TABS: ORAL_TABLET | ORAL | Status: DC

## 2015-01-27 NOTE — Telephone Encounter (Signed)
Called and spoke with pt's spouse and pt would like medication sent to [facility]. Rx sent to pharmacy and pt's spouse is aware.

## 2015-02-28 ENCOUNTER — Other Ambulatory Visit: Payer: Self-pay | Admitting: Family Medicine

## 2015-03-20 ENCOUNTER — Other Ambulatory Visit: Payer: Self-pay | Admitting: Family Medicine

## 2015-04-22 ENCOUNTER — Ambulatory Visit (INDEPENDENT_AMBULATORY_CARE_PROVIDER_SITE_OTHER): Payer: Medicare Other | Admitting: Family Medicine

## 2015-04-22 ENCOUNTER — Encounter: Payer: Self-pay | Admitting: Family Medicine

## 2015-04-22 VITALS — BP 120/70 | HR 58 | Temp 97.7°F | Resp 16 | Ht 67.5 in | Wt 233.3 lb

## 2015-04-22 DIAGNOSIS — E785 Hyperlipidemia, unspecified: Secondary | ICD-10-CM

## 2015-04-22 DIAGNOSIS — E119 Type 2 diabetes mellitus without complications: Secondary | ICD-10-CM

## 2015-04-22 DIAGNOSIS — I10 Essential (primary) hypertension: Secondary | ICD-10-CM

## 2015-04-22 LAB — HEMOGLOBIN A1C: Hgb A1c MFr Bld: 6.6 % — ABNORMAL HIGH (ref 4.6–6.5)

## 2015-04-22 MED ORDER — LISINOPRIL 20 MG PO TABS: 20.0000 mg | ORAL_TABLET | Freq: Every day | ORAL | Status: DC

## 2015-04-22 NOTE — Progress Notes (Signed)
   Subjective:    Patient ID: Tristan Hughes, male    DOB: 02/21/1941, 74 y.o.   MRN: [id number]  HPI  Follow-up  Type 2 diabetes. History of good control. Remains on insulin. No recent hypoglycemia. No polyuria or polydipsia. Also remains on metformin. No side effects  Hyperlipidemia. History of CAD. Remains on Crestor. Takes aspirin regularly. No recent chest pains. Compliant with therapy. Lipids were well controlled last summer  Hypertension. Takes several drug regimen. Blood pressure stable. No dizziness. No headaches. Mild peripheral edema unchanged. No orthopnea  Declines flu vaccine  Past Medical History  Diagnosis Date  . CAD 07/09/2009    Cath 12-02-10 90% left main, 90%LAD, 99% circumflex, 80% RCA  . COLONIC POLYPS, HX OF 07/09/2009    Qualifier: Diagnosis of  By: Valma Cava LPN, [name]    . DIAB W/O COMP TYPE II/UNS NOT STATED UNCNTRL 07/09/2009    Qualifier: Diagnosis of  By: Joyce Gross    . HYPERLIPIDEMIA 07/09/2009    Qualifier: Diagnosis of  By: Valma Cava LPN, [name]    . HYPERTENSION 07/09/2009    Qualifier: Diagnosis of  By: Valma Cava LPN, [name]    . OBSTRUCTIVE SLEEP APNEA 07/09/2009    Qualifier: Diagnosis of  By: Joyce Gross    . History of chicken pox    Past Surgical History  Procedure Laterality Date  . Cholecystectomy    . Biopsy breast    . Tonsillectomy    . Coronary angioplasty with stent placement    . Coronary artery bypass graft  8/12    CABG X 4 LIMA-LAD, SVG-PDB, SVG-OM/RAMUS   /    reports that he has never smoked. He has never used smokeless tobacco. He reports that he does not drink alcohol or use illicit drugs. family history includes Cancer in his mother; Diabetes in his mother; Heart disease in his father and mother; Hypertension in his father and mother; Stroke in his mother. Allergies  Allergen Reactions  . Naproxen Sodium     REACTION: swelling     Review of Systems  Constitutional: Negative for fatigue and unexpected weight  change.  Eyes: Negative for visual disturbance.  Respiratory: Negative for cough, chest tightness and shortness of breath.   Cardiovascular: Negative for chest pain, palpitations and leg swelling.  Endocrine: Negative for polydipsia and polyuria.  Genitourinary: Negative for dysuria.  Neurological: Negative for dizziness, syncope, weakness, light-headedness and headaches.       Objective:   Physical Exam  Constitutional: He is oriented to person, place, and time. He appears well-developed and well-nourished.  HENT:  Right Ear: External ear normal.  Left Ear: External ear normal.  Mouth/Throat: Oropharynx is clear and moist.  Eyes: Pupils are equal, round, and reactive to light.  Neck: Neck supple. No thyromegaly present.  Cardiovascular: Normal rate and regular rhythm.   Pulmonary/Chest: Effort normal and breath sounds normal. No respiratory distress. He has no wheezes. He has no rales.  Musculoskeletal: He exhibits edema.  Trace edema legs bilaterally  Neurological: He is alert and oriented to person, place, and time.          Assessment & Plan:  #1 type 2 diabetes. History of good control. Recheck A1c. Try to lose some weight #2 hypertension. Stable and at goal. Continue current medications #3 dyslipidemia. Well controlled last summer. Recheck in 6 months #4 health maintenance. Flu vaccine recommended and patient declines

## 2015-04-22 NOTE — Progress Notes (Signed)
Pre visit review using our clinic review tool, if applicable. No additional management support is needed unless otherwise documented below in the visit note. 

## 2015-06-17 ENCOUNTER — Other Ambulatory Visit: Payer: Self-pay | Admitting: Family Medicine

## 2015-08-01 ENCOUNTER — Other Ambulatory Visit: Payer: Self-pay | Admitting: Family Medicine

## 2015-08-15 LAB — HM DIABETES EYE EXAM

## 2015-08-16 ENCOUNTER — Encounter: Payer: Self-pay | Admitting: Family Medicine

## 2015-08-16 ENCOUNTER — Other Ambulatory Visit: Payer: Self-pay | Admitting: Family Medicine

## 2015-09-01 ENCOUNTER — Other Ambulatory Visit: Payer: Self-pay | Admitting: Family Medicine

## 2015-09-21 ENCOUNTER — Other Ambulatory Visit: Payer: Self-pay | Admitting: Family Medicine

## 2015-10-14 ENCOUNTER — Other Ambulatory Visit: Payer: Self-pay | Admitting: Family Medicine

## 2015-10-14 LAB — HM DIABETES EYE EXAM

## 2015-10-15 ENCOUNTER — Telehealth: Payer: Self-pay

## 2015-10-15 NOTE — Telephone Encounter (Signed)
Patient is on the list for [employer] 2017 and may be a good candidate for an AWV in 2017. Please let me know if/when appt is scheduled.   

## 2015-10-18 ENCOUNTER — Other Ambulatory Visit: Payer: Self-pay | Admitting: Family Medicine

## 2015-10-21 ENCOUNTER — Ambulatory Visit: Payer: Medicare Other | Admitting: Family Medicine

## 2015-10-22 ENCOUNTER — Encounter: Payer: Self-pay | Admitting: Family Medicine

## 2015-10-22 ENCOUNTER — Ambulatory Visit (INDEPENDENT_AMBULATORY_CARE_PROVIDER_SITE_OTHER): Payer: Medicare Other | Admitting: Family Medicine

## 2015-10-22 VITALS — BP 110/54 | HR 59 | Temp 98.2°F | Ht 67.5 in | Wt 226.0 lb

## 2015-10-22 DIAGNOSIS — I10 Essential (primary) hypertension: Secondary | ICD-10-CM

## 2015-10-22 DIAGNOSIS — Z794 Long term (current) use of insulin: Secondary | ICD-10-CM

## 2015-10-22 DIAGNOSIS — E119 Type 2 diabetes mellitus without complications: Secondary | ICD-10-CM

## 2015-10-22 DIAGNOSIS — I25810 Atherosclerosis of coronary artery bypass graft(s) without angina pectoris: Secondary | ICD-10-CM | POA: Diagnosis not present

## 2015-10-22 DIAGNOSIS — E785 Hyperlipidemia, unspecified: Secondary | ICD-10-CM

## 2015-10-22 LAB — LIPID PANEL
CHOLESTEROL: 90 mg/dL (ref 0–200)
HDL: 38.8 mg/dL — ABNORMAL LOW (ref >39.00)
LDL CALC: 31 mg/dL (ref 0–99)
NonHDL: 50.94
TRIGLYCERIDES: 102 mg/dL (ref 0.0–149.0)
Total CHOL/HDL Ratio: 2
VLDL: 20.4 mg/dL (ref 0.0–40.0)

## 2015-10-22 LAB — BASIC METABOLIC PANEL
BUN: 22 mg/dL (ref 6–23)
CO2: 33 mEq/L — ABNORMAL HIGH (ref 19–32)
CREATININE: 1.02 mg/dL (ref 0.40–1.50)
Calcium: 9.4 mg/dL (ref 8.4–10.5)
Chloride: 103 mEq/L (ref 96–112)
GFR: 75.84 mL/min (ref >60.00)
Glucose, Bld: 145 mg/dL — ABNORMAL HIGH (ref 70–99)
Potassium: 4.8 mEq/L (ref 3.5–5.1)
Sodium: 142 mEq/L (ref 135–145)

## 2015-10-22 LAB — HEPATIC FUNCTION PANEL
ALBUMIN: 4.1 g/dL (ref 3.5–5.2)
ALT: 22 U/L (ref 0–53)
AST: 21 U/L (ref 0–37)
Alkaline Phosphatase: 41 U/L (ref 39–117)
BILIRUBIN TOTAL: 0.9 mg/dL (ref 0.2–1.2)
Bilirubin, Direct: 0.2 mg/dL (ref 0.0–0.3)
Total Protein: 6.4 g/dL (ref 6.0–8.3)

## 2015-10-22 LAB — HEMOGLOBIN A1C: HEMOGLOBIN A1C: 6.3 % (ref 4.6–6.5)

## 2015-10-22 NOTE — Progress Notes (Signed)
Subjective:    Patient ID: Tristan Hughes, male    DOB: 07-01-1940, 75 y.o.   MRN: [id number]  HPI Medical follow-up  History of CAD. Patient had CABG back in 2012 is done extremely well since then. Compliant with all medications. No recent chest pains. No dizziness. Upcoming cataract surgery next month.  Type 2 diabetes. Rare hypoglycemic episode early morning. Fasting blood sugars consistently between 80 and 120. He is on insulin regimen with Humalog with meals and takes 46 units of Lantus once daily.  Hyperlipidemia treated with Crestor. No myalgias. Also takes Zetia. Hypertension has been stable. No recent dizziness. He had some chronic peripheral edema which is controlled with low-dose furosemide 20 mg twice a day. He's had some recent gradual weight loss due to his efforts. Feels good overall  Past Medical History  Diagnosis Date  . CAD 07/09/2009    Cath 12-02-10 90% left main, 90%LAD, 99% circumflex, 80% RCA  . COLONIC POLYPS, HX OF 07/09/2009    Qualifier: Diagnosis of  By: Valma Cava LPN, [name]    . DIAB W/O COMP TYPE II/UNS NOT STATED UNCNTRL 07/09/2009    Qualifier: Diagnosis of  By: Joyce Gross    . HYPERLIPIDEMIA 07/09/2009    Qualifier: Diagnosis of  By: Valma Cava LPN, [name]    . HYPERTENSION 07/09/2009    Qualifier: Diagnosis of  By: Valma Cava LPN, [name]    . OBSTRUCTIVE SLEEP APNEA 07/09/2009    Qualifier: Diagnosis of  By: Joyce Gross    . History of chicken pox    Past Surgical History  Procedure Laterality Date  . Cholecystectomy    . Biopsy breast    . Tonsillectomy    . Coronary angioplasty with stent placement    . Coronary artery bypass graft  8/12    CABG X 4 LIMA-LAD, SVG-PDB, SVG-OM/RAMUS   /    reports that he has never smoked. He has never used smokeless tobacco. He reports that he does not drink alcohol or use illicit drugs. family history includes Cancer in his mother; Diabetes in his mother; Heart disease in his father and mother; Hypertension in  his father and mother; Stroke in his mother. Allergies  Allergen Reactions  . Naproxen Sodium     REACTION: swelling      Review of Systems  Constitutional: Negative for fatigue and unexpected weight change.  Eyes: Negative for visual disturbance.  Respiratory: Negative for cough, chest tightness and shortness of breath.   Cardiovascular: Negative for chest pain and palpitations.  Endocrine: Negative for polydipsia and polyuria.  Neurological: Negative for dizziness, syncope, weakness, light-headedness and headaches.       Objective:   Physical Exam  Constitutional: He is oriented to person, place, and time. He appears well-developed and well-nourished.  HENT:  Right Ear: External ear normal.  Left Ear: External ear normal.  Mouth/Throat: Oropharynx is clear and moist.  Eyes: Pupils are equal, round, and reactive to light.  Neck: Neck supple. No thyromegaly present.  Cardiovascular: Normal rate and regular rhythm.   Pulmonary/Chest: Effort normal and breath sounds normal. No respiratory distress. He has no wheezes. He has no rales.  Musculoskeletal: He exhibits no edema.  Neurological: He is alert and oriented to person, place, and time.          Assessment & Plan:  #1 type 2 diabetes. History of good control. Recheck A1c. If he continues to have low blood sugars early morning fasting consider gradual reduction Lantus  #2 hypertension stable  and at goal. Check basic metabolic panel  #3 history of CAD and hyperlipidemia. Recheck lipid and hepatic panel.  #4 history of CAD. No recent chest pains. He continues to see cardiologist yearly  Eulas Post MD [facility]

## 2015-10-22 NOTE — Progress Notes (Signed)
Pre visit review using our clinic review tool, if applicable. No additional management support is needed unless otherwise documented below in the visit note. 

## 2015-10-25 ENCOUNTER — Other Ambulatory Visit: Payer: Self-pay | Admitting: Family Medicine

## 2015-10-25 MED ORDER — VITAMIN E 180 MG (400 UNIT) PO CAPS: ORAL_CAPSULE | ORAL | Status: DC

## 2016-01-14 ENCOUNTER — Other Ambulatory Visit: Payer: Self-pay | Admitting: Family Medicine

## 2016-01-14 NOTE — Telephone Encounter (Signed)
Rx refill sent to pharmacy. 

## 2016-02-15 ENCOUNTER — Other Ambulatory Visit: Payer: Self-pay | Admitting: Family Medicine

## 2016-02-19 ENCOUNTER — Other Ambulatory Visit: Payer: Self-pay | Admitting: Family Medicine

## 2016-03-20 ENCOUNTER — Other Ambulatory Visit: Payer: Self-pay | Admitting: Family Medicine

## 2016-03-21 ENCOUNTER — Encounter: Payer: Self-pay | Admitting: Family Medicine

## 2016-03-22 ENCOUNTER — Other Ambulatory Visit: Payer: Self-pay

## 2016-03-22 MED ORDER — "INSULIN SYRINGE-NEEDLE U-100 31G X 5/16"" 0.3 ML MISC": 2 refills | Status: DC

## 2016-04-14 ENCOUNTER — Ambulatory Visit: Payer: Medicare Other | Admitting: Family Medicine

## 2016-04-14 ENCOUNTER — Encounter: Payer: Self-pay | Admitting: Family Medicine

## 2016-04-14 VITALS — BP 120/70 | HR 54 | Temp 97.5°F | Ht 67.5 in | Wt 226.7 lb

## 2016-04-14 DIAGNOSIS — Z23 Encounter for immunization: Secondary | ICD-10-CM

## 2016-04-14 DIAGNOSIS — Z794 Long term (current) use of insulin: Secondary | ICD-10-CM | POA: Diagnosis not present

## 2016-04-14 DIAGNOSIS — E119 Type 2 diabetes mellitus without complications: Secondary | ICD-10-CM

## 2016-04-14 DIAGNOSIS — E785 Hyperlipidemia, unspecified: Secondary | ICD-10-CM

## 2016-04-14 DIAGNOSIS — I10 Essential (primary) hypertension: Secondary | ICD-10-CM | POA: Diagnosis not present

## 2016-04-14 DIAGNOSIS — I25810 Atherosclerosis of coronary artery bypass graft(s) without angina pectoris: Secondary | ICD-10-CM | POA: Diagnosis not present

## 2016-04-14 LAB — POCT GLYCOSYLATED HEMOGLOBIN (HGB A1C): HEMOGLOBIN A1C: 6.1

## 2016-04-14 MED ORDER — LISINOPRIL 20 MG PO TABS: 20.0000 mg | ORAL_TABLET | Freq: Every day | ORAL | 3 refills | Status: DC

## 2016-04-14 NOTE — Progress Notes (Signed)
Subjective:     Patient ID: Tristan Hughes, male   DOB: [birth year], 75 y.o.   MRN: [id number]  HPI Patient seen for medical follow-up. He has history of CAD, hypertension, obstructive sleep apnea, dyslipidemia, type 2 diabetes. Recently saw his cardiologist and had increase in lisinopril 20 mg once daily. Needs refills. No other change in medications made.  Blood sugars been well controlled. He has had some recent hypoglycemia episodes in the morning. His wife states that he frequently waits a few hours after getting up to eat. He is also frequently taking Humalog and then checking blood sugar and not using sliding scale as he has previously been prescribed. No symptoms of polyuria or polydipsia. Lipids were well controlled when checked last summer.  Patient refuses flu vaccine. He's had previous Prevnar. No document patient of Pneumovax. He does agree to Pneumovax.  Past Medical History:  Diagnosis Date  . CAD 07/09/2009   Cath 12-02-10 90% left main, 90%LAD, 99% circumflex, 80% RCA  . COLONIC POLYPS, HX OF 07/09/2009   Qualifier: Diagnosis of  By: Valma Cava LPN, [name]    . DIAB W/O COMP TYPE II/UNS NOT STATED UNCNTRL 07/09/2009   Qualifier: Diagnosis of  By: Joyce Gross    . History of chicken pox   . HYPERLIPIDEMIA 07/09/2009   Qualifier: Diagnosis of  By: Valma Cava LPN, [name]    . HYPERTENSION 07/09/2009   Qualifier: Diagnosis of  By: Valma Cava LPN, [name]    . OBSTRUCTIVE SLEEP APNEA 07/09/2009   Qualifier: Diagnosis of  By: Joyce Gross     Past Surgical History:  Procedure Laterality Date  . BIOPSY BREAST    . CHOLECYSTECTOMY    . CORONARY ANGIOPLASTY WITH STENT PLACEMENT    . CORONARY ARTERY BYPASS GRAFT  8/12   CABG X 4 LIMA-LAD, SVG-PDB, SVG-OM/RAMUS   /  . TONSILLECTOMY      reports that he has never smoked. He has never used smokeless tobacco. He reports that he does not drink alcohol or use drugs. family history includes Cancer in his mother; Diabetes in his mother; Heart  disease in his father and mother; Hypertension in his father and mother; Stroke in his mother. Allergies  Allergen Reactions  . Naproxen Sodium     REACTION: swelling     Review of Systems  Constitutional: Negative for fatigue and unexpected weight change.  Eyes: Negative for visual disturbance.  Respiratory: Negative for cough, chest tightness and shortness of breath.   Cardiovascular: Negative for chest pain, palpitations and leg swelling.  Endocrine: Negative for polydipsia and polyuria.  Genitourinary: Negative for dysuria.  Neurological: Negative for dizziness, syncope, weakness, light-headedness and headaches.       Objective:   Physical Exam  Constitutional: He is oriented to person, place, and time. He appears well-developed and well-nourished.  HENT:  Right Ear: External ear normal.  Left Ear: External ear normal.  Mouth/Throat: Oropharynx is clear and moist.  Eyes: Pupils are equal, round, and reactive to light.  Neck: Neck supple. No thyromegaly present.  Cardiovascular: Normal rate and regular rhythm.   Pulmonary/Chest: Effort normal and breath sounds normal. No respiratory distress. He has no wheezes. He has no rales.  Musculoskeletal: He exhibits no edema.  Neurological: He is alert and oriented to person, place, and time.       Assessment:     #1 hypertension. Excellent reading today. Recent increased per cardiology. Refills of lisinopril 20 mg daily  #2 type 2 diabetes. History of good control.  Occasional recent hypoglycemia  #3 history of CAD. Stable with no recent chest pains    Plan:     -Refill lisinopril 20 mg daily for one year -Be sure to eat soon after getting up and also consistently check blood sugars before giving Humalog -We gave him sliding scale for Humalog to use 3 times daily with meals -Pneumovax recommended. We also recommend flu vaccine but he declines -Routine follow up 6 months  Eulas Post MD [facility] at  [facility]

## 2016-04-14 NOTE — Patient Instructions (Signed)
Recommend Humalog sliding scale insulin with meals 3 times daily as follows:  Pre-meal blood sugar                                            Humalog units of insulin  <124                                                                                  0 125-150                                                                             2 151-200                                                                             4 201-250                                                                             6 251-300                                                                             8 301-350                                                                            10 351-400  12 >400                                                                                 12 and call MD

## 2016-04-20 ENCOUNTER — Ambulatory Visit (INDEPENDENT_AMBULATORY_CARE_PROVIDER_SITE_OTHER): Payer: Medicare Other | Admitting: Family Medicine

## 2016-04-20 ENCOUNTER — Encounter: Payer: Self-pay | Admitting: Family Medicine

## 2016-04-20 VITALS — BP 128/68 | HR 58 | Temp 97.4°F | Ht 67.5 in | Wt 223.8 lb

## 2016-04-20 DIAGNOSIS — R42 Dizziness and giddiness: Secondary | ICD-10-CM | POA: Diagnosis not present

## 2016-04-20 NOTE — Patient Instructions (Signed)
Stay hydrated.  Please try taking a half dose of the lisinopril.  Follow up with your cardiologist in 2-3 weeks or sooner if any worsening, new symptoms or concerns.  I hope you feel better soon!

## 2016-04-20 NOTE — Progress Notes (Signed)
Pre visit review using our clinic review tool, if applicable. No additional management support is needed unless otherwise documented below in the visit note. 

## 2016-04-20 NOTE — Progress Notes (Signed)
HPI:  Tristan Hughes is a pleasant 75 yo with PMH listed below here for an acute visit for feeling " dizzy": -start about 1 week ago a few hours after a flu shot -reports feels fine at rest but when stands up suddenly feels whoozy or mildly dizzy and this wears off after standing for awhile, does not notice with sudden head turns -reports "I fell fine and do not feel sick" -reports blood sugars have been good -denies: weakness, numbness, fevers, malaise, SOB, DOE, CP, palpitations, HA, vision changes, speech changes or recent illness -there is some confusion regarding his BP medication lisinopril - reports his cardiologist told him recently to increase it from 10, to 20 recently, but wife feels he has been taking 20 all along. -he does not recall other similar events in the past   ROS: See pertinent positives and negatives per HPI.  Past Medical History:  Diagnosis Date  . CAD 07/09/2009   Cath 12-02-10 90% left main, 90%LAD, 99% circumflex, 80% RCA  . COLONIC POLYPS, HX OF 07/09/2009   Qualifier: Diagnosis of  By: Valma Cava LPN, [name]    . DIAB W/O COMP TYPE II/UNS NOT STATED UNCNTRL 07/09/2009   Qualifier: Diagnosis of  By: Joyce Gross    . History of chicken pox   . HYPERLIPIDEMIA 07/09/2009   Qualifier: Diagnosis of  By: Valma Cava LPN, [name]    . HYPERTENSION 07/09/2009   Qualifier: Diagnosis of  By: Valma Cava LPN, [name]    . OBSTRUCTIVE SLEEP APNEA 07/09/2009   Qualifier: Diagnosis of  By: Joyce Gross      Past Surgical History:  Procedure Laterality Date  . BIOPSY BREAST    . CHOLECYSTECTOMY    . CORONARY ANGIOPLASTY WITH STENT PLACEMENT    . CORONARY ARTERY BYPASS GRAFT  8/12   CABG X 4 LIMA-LAD, SVG-PDB, SVG-OM/RAMUS   /  . TONSILLECTOMY      Family History  Problem Relation Age of Onset  . Hypertension Mother   . Heart disease Mother   . Stroke Mother   . Diabetes Mother   . Cancer Mother     pancreatic  . Heart disease Father   . Hypertension Father      Social History   Social History  . Marital status: Married    Spouse name: N/A  . Number of children: N/A  . Years of education: N/A   Social History Main Topics  . Smoking status: Never Smoker  . Smokeless tobacco: Never Used  . Alcohol use No  . Drug use: No  . Sexual activity: Not Asked   Other Topics Concern  . None   Social History Narrative  . None     Current Outpatient Prescriptions:  .  amLODipine (NORVASC) 5 MG tablet, TAKE 1 TABLET DAILY, Disp: 90 tablet, Rfl: 1 .  aspirin 81 MG tablet, Take 81 mg by mouth daily., Disp: , Rfl:  .  Cholecalciferol (VITAMIN D) 2000 UNITS CAPS, Take 2,000 Units by mouth QID., Disp: , Rfl:  .  ezetimibe (ZETIA) 10 MG tablet, TAKE 1 TABLET DAILY, Disp: 90 tablet, Rfl: 2 .  furosemide (LASIX) 20 MG tablet, TAKE 1 TABLET TWICE A DAY, Disp: 180 tablet, Rfl: 1 .  [place] 300 MG CAPS, Take 300 mg by mouth 2 (two) times daily., Disp: , Rfl:  .  HUMALOG 100 UNIT/ML injection, INJECT 15 TO 18 UNITS UNDER THE SKIN THREE TIMES A DAY BEFORE MEALS, Disp: 50 mL, Rfl: 2 .  Insulin  Syringe-Needle U-100 (BD INSULIN SYRINGE ULTRAFINE) 31G X 5/16" 0.3 ML MISC, USE THREE TIMES A DAY, Disp: 300 each, Rfl: 2 .  LANTUS 100 UNIT/ML injection, INJECT 46 UNITS UNDER THE SKIN AT BEDTIME, Disp: 50 mL, Rfl: 5 .  lisinopril (PRINIVIL,ZESTRIL) 20 MG tablet, Take 1 tablet (20 mg total) by mouth daily., Disp: 90 tablet, Rfl: 3 .  metFORMIN (GLUCOPHAGE) 1000 MG tablet, TAKE 1 TABLET TWICE A DAY WITH MEALS, Disp: 180 tablet, Rfl: 1 .  metoprolol (LOPRESSOR) 50 MG tablet, TAKE ONE AND ONE-HALF TABLETS TWICE A DAY, Disp: 270 tablet, Rfl: 1 .  rosuvastatin (CRESTOR) 20 MG tablet, TAKE 1 TABLET DAILY, Disp: 90 tablet, Rfl: 2 .  vitamin E (VITAMIN E) 400 UNIT capsule, Takes one tablet by mouth daily., Disp: , Rfl:   EXAM:  Vitals:   04/20/16 1128  BP: 128/68  Pulse: (!) 58  Temp: 97.4 F (36.3 C)  BP/P lying:132/74, 56 BP sitting: 128/76, 64 BP standing:  116/64, 64 Body mass index is 34.53 kg/m.  GENERAL: vitals reviewed and listed above, alert, oriented, appears well hydrated and in no acute distress  HEENT: atraumatic, conjunttiva clear, PERRLA, visual acuity grossly intact, no obvious abnormalities on inspection of external nose and ears, normal inspection ear canal and TMs  NECK: no obvious masses on inspection, no bruit  LUNGS: clear to auscultation bilaterally, no wheezes, rales or rhonchi, good air movement  CV: HRRR, no peripheral edema  MS: moves all extremities without noticeable abnormality  PSYCH/NEURO: pleasant and cooperative, no obvious depression or anxiety, CN II-XII grossly intact, finger to nose normal, gait normal, normal gross strength and function upper an lower extremities, mildly positive dix halpike R  ASSESSMENT AND PLAN:  Discussed the following assessment and plan:  Dizziness  -symptoms and exam support perhaps mild over treatment of his BP -advised hydration, trial halving lisinopril given recent confusion and close follow up with cardiologist or PCP -of note, may have mild BPPV, but symptoms seem more orthostatic in nature, would address this first and if no improvement advised re-eval  -Patient advised to return or notify a doctor immediately if symptoms worsen or persist or new concerns arise.  Patient Instructions  Stay hydrated.  Please try taking a half dose of the lisinopril.  Follow up with your cardiologist in 2-3 weeks or sooner if any worsening, new symptoms or concerns.  I hope you feel better soon!   Colin Benton R., DO

## 2016-05-10 ENCOUNTER — Other Ambulatory Visit: Payer: Self-pay | Admitting: Family Medicine

## 2016-06-03 ENCOUNTER — Other Ambulatory Visit: Payer: Self-pay | Admitting: Family Medicine

## 2016-07-28 ENCOUNTER — Other Ambulatory Visit: Payer: Self-pay | Admitting: Family Medicine

## 2016-08-21 LAB — HM DIABETES EYE EXAM

## 2016-08-22 ENCOUNTER — Encounter: Payer: Self-pay | Admitting: Family Medicine

## 2016-09-25 ENCOUNTER — Other Ambulatory Visit: Payer: Self-pay | Admitting: Family Medicine

## 2016-10-13 ENCOUNTER — Encounter: Payer: Self-pay | Admitting: Family Medicine

## 2016-10-15 ENCOUNTER — Other Ambulatory Visit: Payer: Self-pay | Admitting: Family Medicine

## 2016-10-16 ENCOUNTER — Ambulatory Visit: Payer: Medicare Other | Admitting: Family Medicine

## 2016-10-16 MED ORDER — INSULIN PEN NEEDLE 31G X 8 MM MISC: 3 refills | Status: DC

## 2016-10-20 ENCOUNTER — Encounter: Payer: Self-pay | Admitting: Family Medicine

## 2016-10-20 ENCOUNTER — Ambulatory Visit (INDEPENDENT_AMBULATORY_CARE_PROVIDER_SITE_OTHER): Payer: Medicare Other | Admitting: Family Medicine

## 2016-10-20 VITALS — BP 108/64 | HR 59 | Temp 98.6°F | Wt 221.2 lb

## 2016-10-20 DIAGNOSIS — I25810 Atherosclerosis of coronary artery bypass graft(s) without angina pectoris: Secondary | ICD-10-CM | POA: Diagnosis not present

## 2016-10-20 DIAGNOSIS — I10 Essential (primary) hypertension: Secondary | ICD-10-CM

## 2016-10-20 DIAGNOSIS — E119 Type 2 diabetes mellitus without complications: Secondary | ICD-10-CM | POA: Diagnosis not present

## 2016-10-20 DIAGNOSIS — E785 Hyperlipidemia, unspecified: Secondary | ICD-10-CM

## 2016-10-20 DIAGNOSIS — Z794 Long term (current) use of insulin: Secondary | ICD-10-CM | POA: Diagnosis not present

## 2016-10-20 LAB — HEPATIC FUNCTION PANEL
ALT: 21 U/L (ref 0–53)
AST: 20 U/L (ref 0–37)
Albumin: 3.9 g/dL (ref 3.5–5.2)
Alkaline Phosphatase: 38 U/L — ABNORMAL LOW (ref 39–117)
BILIRUBIN DIRECT: 0.2 mg/dL (ref 0.0–0.3)
TOTAL PROTEIN: 6.1 g/dL (ref 6.0–8.3)
Total Bilirubin: 0.8 mg/dL (ref 0.2–1.2)

## 2016-10-20 LAB — BASIC METABOLIC PANEL
BUN: 20 mg/dL (ref 6–23)
CHLORIDE: 103 meq/L (ref 96–112)
CO2: 26 meq/L (ref 19–32)
CREATININE: 0.97 mg/dL (ref 0.40–1.50)
Calcium: 9 mg/dL (ref 8.4–10.5)
GFR: 80.15 mL/min (ref >60.00)
Glucose, Bld: 296 mg/dL — ABNORMAL HIGH (ref 70–99)
POTASSIUM: 4.3 meq/L (ref 3.5–5.1)
Sodium: 137 mEq/L (ref 135–145)

## 2016-10-20 LAB — LIPID PANEL
CHOL/HDL RATIO: 2
CHOLESTEROL: 93 mg/dL (ref 0–200)
HDL: 37.8 mg/dL — ABNORMAL LOW (ref >39.00)
LDL Cholesterol: 40 mg/dL (ref 0–99)
NonHDL: 54.82
TRIGLYCERIDES: 74 mg/dL (ref 0.0–149.0)
VLDL: 14.8 mg/dL (ref 0.0–40.0)

## 2016-10-20 LAB — POCT GLYCOSYLATED HEMOGLOBIN (HGB A1C): Hemoglobin A1C: 6.3

## 2016-10-20 NOTE — Progress Notes (Signed)
Subjective:     Patient ID: Tristan GIVAN, male   DOB: 01/03/1941, 76 y.o.   MRN: [id number]  HPI Patient here for routine medical follow-up. He has history of CAD with prior bypass about 6 years ago, type 2 diabetes, hyperlipidemia, hypertension, obstructive sleep apnea. Medications reviewed. His cardiologist tried to increase his lisinopril to 20 mg daily but had lightheadedness. He is currently back to half tablet daily. Denies any orthopnea. No dyspnea with exertion.  He enjoys gardening and has been very active with this recently.  No recent chest pains.  Blood sugars stable.  Compliant with medications and no side effects (other than above).   Past Medical History:  Diagnosis Date  . CAD 07/09/2009   Cath 12-02-10 90% left main, 90%LAD, 99% circumflex, 80% RCA  . COLONIC POLYPS, HX OF 07/09/2009   Qualifier: Diagnosis of  By: Valma Cava LPN, [name]    . DIAB W/O COMP TYPE II/UNS NOT STATED UNCNTRL 07/09/2009   Qualifier: Diagnosis of  By: Joyce Gross    . History of chicken pox   . HYPERLIPIDEMIA 07/09/2009   Qualifier: Diagnosis of  By: Valma Cava LPN, [name]    . HYPERTENSION 07/09/2009   Qualifier: Diagnosis of  By: Valma Cava LPN, [name]    . OBSTRUCTIVE SLEEP APNEA 07/09/2009   Qualifier: Diagnosis of  By: Joyce Gross     Past Surgical History:  Procedure Laterality Date  . BIOPSY BREAST    . CHOLECYSTECTOMY    . CORONARY ANGIOPLASTY WITH STENT PLACEMENT    . CORONARY ARTERY BYPASS GRAFT  8/12   CABG X 4 LIMA-LAD, SVG-PDB, SVG-OM/RAMUS   /  . TONSILLECTOMY      reports that he has never smoked. He has never used smokeless tobacco. He reports that he does not drink alcohol or use drugs. family history includes Cancer in his mother; Diabetes in his mother; Heart disease in his father and mother; Hypertension in his father and mother; Stroke in his mother. Allergies  Allergen Reactions  . Naproxen Sodium     REACTION: swelling     Review of Systems  Constitutional: Negative  for fatigue.  Eyes: Negative for visual disturbance.  Respiratory: Negative for cough, chest tightness and shortness of breath.   Cardiovascular: Negative for chest pain, palpitations and leg swelling.  Neurological: Negative for syncope, weakness and headaches.       Objective:   Physical Exam  Constitutional: He is oriented to person, place, and time. He appears well-developed and well-nourished.  HENT:  Right Ear: External ear normal.  Left Ear: External ear normal.  Mouth/Throat: Oropharynx is clear and moist.  Eyes: Pupils are equal, round, and reactive to light.  Neck: Neck supple. No thyromegaly present.  Cardiovascular: Normal rate and regular rhythm.   Pulmonary/Chest: Effort normal and breath sounds normal. No respiratory distress. He has no wheezes. He has no rales.  Musculoskeletal: He exhibits no edema.  Neurological: He is alert and oriented to person, place, and time.       Assessment:     #1 history of CAD (CABG)  #2 hyperlipidemia  #3 hypertension stable and at goal  #4 type 2 diabetes well-controlled with hemoglobin A1c 6.3%    Plan:     -Check further labs with lipid panel, hepatic panel, basic metabolic panel -Routine follow-up in 6 months and sooner as needed -Continue with yearly eye exams  Eulas Post MD [facility] at [facility]

## 2016-10-27 ENCOUNTER — Other Ambulatory Visit: Payer: Self-pay | Admitting: *Deleted

## 2016-10-27 ENCOUNTER — Other Ambulatory Visit: Payer: Self-pay | Admitting: Family Medicine

## 2016-10-27 MED ORDER — "INSULIN SYRINGE-NEEDLE U-100 31G X 5/16"" 1 ML MISC": 100.0000 | Freq: Once | 12 refills | Status: AC

## 2016-10-27 NOTE — Telephone Encounter (Signed)
Patient came into the office with boxes of pen needles.  The pen needles were ordered in error.  A new prescription for insuline syringes and needles were ordered and verified by the patient.  I also called [facility] to see if the pen needles could be returned.  The pen needles could not be returned due to safety reasons.  The patient was given 30 replacement needle and syringes to take home with him.  Patient left the office with no complaint.

## 2016-12-15 ENCOUNTER — Emergency Department (HOSPITAL_COMMUNITY): Payer: Medicare Other

## 2016-12-15 ENCOUNTER — Other Ambulatory Visit: Payer: Self-pay | Admitting: Family Medicine

## 2016-12-15 ENCOUNTER — Encounter (HOSPITAL_COMMUNITY): Payer: Self-pay | Admitting: Emergency Medicine

## 2016-12-15 ENCOUNTER — Emergency Department (HOSPITAL_COMMUNITY)
Admission: EM | Admit: 2016-12-15 | Discharge: 2016-12-16 | Disposition: A | Discharge status: Home / Self Care | Payer: Medicare Other | Attending: Emergency Medicine | Admitting: Emergency Medicine

## 2016-12-15 DIAGNOSIS — I2510 Atherosclerotic heart disease of native coronary artery without angina pectoris: Secondary | ICD-10-CM | POA: Insufficient documentation

## 2016-12-15 DIAGNOSIS — Z794 Long term (current) use of insulin: Secondary | ICD-10-CM | POA: Insufficient documentation

## 2016-12-15 DIAGNOSIS — I119 Hypertensive heart disease without heart failure: Secondary | ICD-10-CM | POA: Insufficient documentation

## 2016-12-15 DIAGNOSIS — E162 Hypoglycemia, unspecified: Secondary | ICD-10-CM

## 2016-12-15 DIAGNOSIS — E119 Type 2 diabetes mellitus without complications: Secondary | ICD-10-CM | POA: Insufficient documentation

## 2016-12-15 DIAGNOSIS — Z79899 Other long term (current) drug therapy: Secondary | ICD-10-CM | POA: Diagnosis not present

## 2016-12-15 LAB — CBC
HCT: 48.1 % (ref 39.0–52.0)
Hemoglobin: 16.2 g/dL (ref 13.0–17.0)
MCH: 29.8 pg (ref 26.0–34.0)
MCHC: 33.7 g/dL (ref 30.0–36.0)
MCV: 88.4 fL (ref 78.0–100.0)
PLATELETS: 165 10*3/uL (ref 150–400)
RBC: 5.44 MIL/uL (ref 4.22–5.81)
RDW: 13.5 % (ref 11.5–15.5)
WBC: 9.5 10*3/uL (ref 4.0–10.5)

## 2016-12-15 LAB — CBG MONITORING, ED: Glucose-Capillary: 123 mg/dL — ABNORMAL HIGH (ref 65–99)

## 2016-12-15 LAB — BASIC METABOLIC PANEL
Anion gap: 7 (ref 5–15)
BUN: 23 mg/dL — AB (ref 6–20)
CHLORIDE: 102 mmol/L (ref 101–111)
CO2: 30 mmol/L (ref 22–32)
CREATININE: 1.05 mg/dL (ref 0.61–1.24)
Calcium: 9.5 mg/dL (ref 8.9–10.3)
GFR calc Af Amer: >60 mL/min (ref >60)
GFR calc non Af Amer: >60 mL/min (ref >60)
GLUCOSE: 161 mg/dL — AB (ref 65–99)
Potassium: 4.1 mmol/L (ref 3.5–5.1)
Sodium: 139 mmol/L (ref 135–145)

## 2016-12-15 NOTE — Discharge Instructions (Signed)
Continue to monitor blood sugar closely, expect to be stiff and sore for the next several days, return to the emergency room for worsening symptoms.
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
## 2016-12-18 LAB — CBG MONITORING, ED: Glucose-Capillary: 94 mg/dL (ref 65–99)

## 2016-12-19 ENCOUNTER — Encounter: Payer: Self-pay | Admitting: Family Medicine

## 2016-12-19 ENCOUNTER — Ambulatory Visit (INDEPENDENT_AMBULATORY_CARE_PROVIDER_SITE_OTHER): Payer: Medicare Other | Admitting: Family Medicine

## 2016-12-19 DIAGNOSIS — E162 Hypoglycemia, unspecified: Secondary | ICD-10-CM | POA: Diagnosis not present

## 2016-12-19 DIAGNOSIS — Z794 Long term (current) use of insulin: Secondary | ICD-10-CM | POA: Diagnosis not present

## 2016-12-19 DIAGNOSIS — E119 Type 2 diabetes mellitus without complications: Secondary | ICD-10-CM | POA: Diagnosis not present

## 2016-12-19 MED ORDER — "INSULIN SYRINGE-NEEDLE U-100 31G X 5/16"" 1 ML MISC": 3 refills | Status: DC

## 2016-12-19 NOTE — Patient Instructions (Signed)
Reduce the Humalog down to 15 units with meals Continue with same dose of the Lantus Let me know if any further readings below 70.   Hypoglycemia Hypoglycemia occurs when the level of sugar (glucose) in the blood is too low. Glucose is a type of sugar that provides the body's main source of energy. Certain hormones (insulin and glucagon) control the level of glucose in the blood. Insulin lowers blood glucose, and glucagon increases blood glucose. Hypoglycemia can result from having too much insulin in the bloodstream, or from not eating enough food that contains glucose. Hypoglycemia can happen in people who do or do not have diabetes. It can develop quickly, and it can be a medical emergency. What are the causes? Hypoglycemia occurs most often in people who have diabetes. If you have diabetes, hypoglycemia may be caused by:  Diabetes medicine.  Not eating enough, or not eating often enough.  Increased physical activity.  Drinking alcohol, especially when you have not eaten recently.  If you do not have diabetes, hypoglycemia may be caused by:  A tumor in the pancreas. The pancreas is the organ that makes insulin.  Not eating enough, or not eating for long periods at a time (fasting).  Severe infection or illness that affects the liver, heart, or kidneys.  Certain medicines.  You may also have reactive hypoglycemia. This condition causes hypoglycemia within 4 hours of eating a meal. This may occur after having stomach surgery. Sometimes, the cause of reactive hypoglycemia is not known. What increases the risk? Hypoglycemia is more likely to develop in:  People who have diabetes and take medicines to lower blood glucose.  People who abuse alcohol.  People who have a severe illness.  What are the signs or symptoms? Hypoglycemia may not cause any symptoms. If you have symptoms, they may include:  Hunger.  Anxiety.  Sweating and feeling clammy.  Confusion.  Dizziness or  feeling light-headed.  Sleepiness.  Nausea.  Increased heart rate.  Headache.  Blurry vision.  Seizure.  Nightmares.  Tingling or numbness around the mouth, lips, or tongue.  A change in speech.  Decreased ability to concentrate.  A change in coordination.  Restless sleep.  Tremors or shakes.  Fainting.  Irritability.  How is this diagnosed? Hypoglycemia is diagnosed with a blood test to measure your blood glucose level. This blood test is done while you are having symptoms. Your health care provider may also do a physical exam and review your medical history. If you do not have diabetes, other tests may be done to find the cause of your hypoglycemia. How is this treated? This condition can often be treated by immediately eating or drinking something that contains glucose, such as:  3-4 sugar tablets (glucose pills).  Glucose gel, 15-gram tube.  Fruit juice, 4 oz (120 mL).  Regular soda (not diet soda), 4 oz (120 mL).  Low-fat milk, 4 oz (120 mL).  Several pieces of hard candy.  Sugar or honey, 1 Tbsp.  Treating Hypoglycemia If You Have Diabetes  If you are alert and able to swallow safely, follow the 15:15 rule:  Take 15 grams of a rapid-acting carbohydrate. Rapid-acting options include: ? 1 tube of glucose gel. ? 3 glucose pills. ? 6-8 pieces of hard candy. ? 4 oz (120 mL) of fruit juice. ? 4 oz (120 ml) of regular (not diet) soda.  Check your blood glucose 15 minutes after you take the carbohydrate.  If the repeat blood glucose level is still at  or below 70 mg/dL (3.9 mmol/L), take 15 grams of a carbohydrate again.  If your blood glucose level does not increase above 70 mg/dL (3.9 mmol/L) after 3 tries, seek emergency medical care.  After your blood glucose level returns to normal, eat a meal or a snack within 1 hour.  Treating Severe Hypoglycemia Severe hypoglycemia is when your blood glucose level is at or below 54 mg/dL (3 mmol/L). Severe  hypoglycemia is an emergency. Do not wait to see if the symptoms will go away. Get medical help right away. Call your local emergency services (911 in the [place] Do not drive yourself to the hospital. If you have severe hypoglycemia and you cannot eat or drink, you may need an injection of glucagon. A family member or close friend should learn how to check your blood glucose and how to give you a glucagon injection. Ask your health care provider if you need to have an emergency glucagon injection kit available. Severe hypoglycemia may need to be treated in a hospital. The treatment may include getting glucose through an IV tube. You may also need treatment for the cause of your hypoglycemia. Follow these instructions at home: General instructions  Avoid any diets that cause you to not eat enough food. Talk with your health care provider before you start any new diet.  Take over-the-counter and prescription medicines only as told by your health care provider.  Limit alcohol intake to no more than 1 drink per day for nonpregnant women and 2 drinks per day for men. One drink equals 12 oz of beer, 5 oz of wine, or 1 oz of hard liquor.  Keep all follow-up visits as told by your health care provider. This is important. If You Have Diabetes:   Make sure you know the symptoms of hypoglycemia.  Always have a rapid-acting carbohydrate snack with you to treat low blood sugar.  Follow your diabetes management plan, as told by your health care provider. Make sure you: ? Take your medicines as directed. ? Follow your exercise plan. ? Follow your meal plan. Eat on time, and do not skip meals. ? Check your blood glucose as often as directed. Make sure to check your blood glucose before and after exercise. If you exercise longer or in a different way than usual, check your blood glucose more often. ? Follow your sick day plan whenever you cannot eat or drink normally. Make this plan in advance with your  health care provider.  Share your diabetes management plan with people in your workplace, school, and household.  Check your urine for ketones when you are ill and as told by your health care provider.  Carry a medical alert card or wear medical alert jewelry. If You Have Reactive Hypoglycemia or Low Blood Sugar From Other Causes:  Monitor your blood glucose as told by your health care provider.  Follow instructions from your health care provider about eating or drinking restrictions. Contact a health care provider if:  You have problems keeping your blood glucose in your target range.  You have frequent episodes of hypoglycemia. Get help right away if:  You continue to have hypoglycemia symptoms after eating or drinking something containing glucose.  Your blood glucose is at or below 54 mg/dL (3 mmol/L).  You have a seizure.  You faint. These symptoms may represent a serious problem that is an emergency. Do not wait to see if the symptoms will go away. Get medical help right away. Call your local emergency  services (911 in the [place] Do not drive yourself to the hospital. This information is not intended to replace advice given to you by your health care provider. Make sure you discuss any questions you have with your health care provider. Document Released: 04/17/2005 Document Revised: 09/29/2015 Document Reviewed: 05/21/2015 Elsevier Interactive Patient Education  [employer].
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
## 2016-12-24 ENCOUNTER — Encounter: Payer: Self-pay | Admitting: Family Medicine

## 2017-01-02 ENCOUNTER — Other Ambulatory Visit: Payer: Self-pay | Admitting: Family Medicine

## 2017-01-18 ENCOUNTER — Encounter: Payer: Self-pay | Admitting: Family Medicine

## 2017-02-26 ENCOUNTER — Other Ambulatory Visit: Payer: Self-pay | Admitting: Family Medicine

## 2017-03-03 ENCOUNTER — Other Ambulatory Visit: Payer: Self-pay | Admitting: Family Medicine

## 2017-03-13 ENCOUNTER — Other Ambulatory Visit: Payer: Self-pay | Admitting: Family Medicine

## 2017-04-13 ENCOUNTER — Other Ambulatory Visit: Payer: Self-pay | Admitting: Family Medicine

## 2017-05-11 ENCOUNTER — Ambulatory Visit: Payer: Medicare Other | Admitting: Family Medicine

## 2017-05-11 VITALS — BP 118/60 | HR 60 | Temp 97.6°F | Ht 73.0 in | Wt 221.5 lb

## 2017-05-11 DIAGNOSIS — E119 Type 2 diabetes mellitus without complications: Secondary | ICD-10-CM

## 2017-05-11 DIAGNOSIS — Z794 Long term (current) use of insulin: Secondary | ICD-10-CM | POA: Diagnosis not present

## 2017-05-11 LAB — POCT GLYCOSYLATED HEMOGLOBIN (HGB A1C): HEMOGLOBIN A1C: 6.5

## 2017-05-11 NOTE — Progress Notes (Signed)
Subjective:     Patient ID: Tristan Hughes, male   DOB: 07-Nov-1940, 77 y.o.   MRN: [id number]  HPI Patient here for follow-up type 2 diabetes. Other medical problems include history of hyperlipidemia, hypertension, CAD. Medications reviewed. Blood sugars been stable. No hypoglycemia. No polyuria or polydipsia.  Medications reviewed. Compliant with all.  Past Medical History:  Diagnosis Date  . CAD 07/09/2009   Cath 12-02-10 90% left main, 90%LAD, 99% circumflex, 80% RCA  . COLONIC POLYPS, HX OF 07/09/2009   Qualifier: Diagnosis of  By: Valma Cava LPN, [name]    . DIAB W/O COMP TYPE II/UNS NOT STATED UNCNTRL 07/09/2009   Qualifier: Diagnosis of  By: Joyce Gross    . History of chicken pox   . HYPERLIPIDEMIA 07/09/2009   Qualifier: Diagnosis of  By: Valma Cava LPN, [name]    . HYPERTENSION 07/09/2009   Qualifier: Diagnosis of  By: Valma Cava LPN, [name]    . OBSTRUCTIVE SLEEP APNEA 07/09/2009   Qualifier: Diagnosis of  By: Joyce Gross     Past Surgical History:  Procedure Laterality Date  . BIOPSY BREAST    . CHOLECYSTECTOMY    . CORONARY ANGIOPLASTY WITH STENT PLACEMENT    . CORONARY ARTERY BYPASS GRAFT  8/12   CABG X 4 LIMA-LAD, SVG-PDB, SVG-OM/RAMUS   /  . TONSILLECTOMY      reports that  has never smoked. he has never used smokeless tobacco. He reports that he does not drink alcohol or use drugs. family history includes Cancer in his mother; Diabetes in his mother; Heart disease in his father and mother; Hypertension in his father and mother; Stroke in his mother. Allergies  Allergen Reactions  . Naproxen Sodium     REACTION: swelling     Review of Systems  Constitutional: Negative for fatigue and unexpected weight change.  Eyes: Negative for visual disturbance.  Respiratory: Negative for cough, chest tightness and shortness of breath.   Cardiovascular: Negative for chest pain, palpitations and leg swelling.  Endocrine: Negative for polydipsia and polyuria.  Neurological:  Negative for dizziness, syncope, weakness, light-headedness and headaches.       Objective:   Physical Exam  Constitutional: He is oriented to person, place, and time. He appears well-developed and well-nourished.  HENT:  Right Ear: External ear normal.  Left Ear: External ear normal.  Mouth/Throat: Oropharynx is clear and moist.  Eyes: Pupils are equal, round, and reactive to light.  Neck: Neck supple. No thyromegaly present.  Cardiovascular: Normal rate and regular rhythm.  Pulmonary/Chest: Effort normal and breath sounds normal. No respiratory distress. He has no wheezes. He has no rales.  Musculoskeletal: He exhibits no edema.  Neurological: He is alert and oriented to person, place, and time.       Assessment:     Type 2 diabetes. Well controlled with A1c today 6.6%    Plan:     -Continue current medication regimen -Flu vaccine recommended and declined -Routine follow-up in 6 months and obtain other labs that point with lipid panel, hepatic panel, chemistries along with A1c  Eulas Post MD [facility]

## 2017-05-18 ENCOUNTER — Encounter: Payer: Self-pay | Admitting: Family Medicine

## 2017-05-18 LAB — HM COLONOSCOPY

## 2017-05-25 ENCOUNTER — Encounter: Payer: Self-pay | Admitting: Family Medicine

## 2017-06-20 ENCOUNTER — Other Ambulatory Visit: Payer: Self-pay | Admitting: Family Medicine

## 2017-08-14 ENCOUNTER — Other Ambulatory Visit: Payer: Self-pay | Admitting: Family Medicine

## 2017-08-30 LAB — HM DIABETES EYE EXAM

## 2017-08-31 ENCOUNTER — Encounter: Payer: Self-pay | Admitting: Family Medicine

## 2017-09-18 ENCOUNTER — Other Ambulatory Visit: Payer: Self-pay | Admitting: Family Medicine

## 2017-10-20 ENCOUNTER — Other Ambulatory Visit: Payer: Self-pay | Admitting: Family Medicine

## 2017-11-08 ENCOUNTER — Other Ambulatory Visit: Payer: Self-pay | Admitting: Family Medicine

## 2017-11-14 ENCOUNTER — Other Ambulatory Visit: Payer: Self-pay | Admitting: Family Medicine

## 2017-11-16 ENCOUNTER — Encounter: Payer: Self-pay | Admitting: Family Medicine

## 2017-11-16 ENCOUNTER — Ambulatory Visit: Payer: Medicare Other | Admitting: Family Medicine

## 2017-11-16 VITALS — BP 102/62 | HR 51 | Temp 98.1°F | Ht 68.5 in | Wt 229.9 lb

## 2017-11-16 DIAGNOSIS — Z794 Long term (current) use of insulin: Secondary | ICD-10-CM

## 2017-11-16 DIAGNOSIS — I10 Essential (primary) hypertension: Secondary | ICD-10-CM

## 2017-11-16 DIAGNOSIS — E119 Type 2 diabetes mellitus without complications: Secondary | ICD-10-CM

## 2017-11-16 DIAGNOSIS — E785 Hyperlipidemia, unspecified: Secondary | ICD-10-CM

## 2017-11-16 DIAGNOSIS — I25810 Atherosclerosis of coronary artery bypass graft(s) without angina pectoris: Secondary | ICD-10-CM

## 2017-11-16 LAB — HEPATIC FUNCTION PANEL
ALBUMIN: 4 g/dL (ref 3.5–5.2)
ALK PHOS: 50 U/L (ref 39–117)
ALT: 20 U/L (ref 0–53)
AST: 17 U/L (ref 0–37)
Bilirubin, Direct: 0.2 mg/dL (ref 0.0–0.3)
TOTAL PROTEIN: 6.7 g/dL (ref 6.0–8.3)
Total Bilirubin: 1 mg/dL (ref 0.2–1.2)

## 2017-11-16 LAB — LIPID PANEL
CHOLESTEROL: 104 mg/dL (ref 0–200)
HDL: 39.6 mg/dL (ref >39.00)
LDL Cholesterol: 45 mg/dL (ref 0–99)
NonHDL: 64.51
TRIGLYCERIDES: 98 mg/dL (ref 0.0–149.0)
Total CHOL/HDL Ratio: 3
VLDL: 19.6 mg/dL (ref 0.0–40.0)

## 2017-11-16 LAB — BASIC METABOLIC PANEL
BUN: 18 mg/dL (ref 6–23)
CALCIUM: 8.9 mg/dL (ref 8.4–10.5)
CO2: 31 mEq/L (ref 19–32)
Chloride: 104 mEq/L (ref 96–112)
Creatinine, Ser: 0.93 mg/dL (ref 0.40–1.50)
GFR: 83.9 mL/min (ref >60.00)
Glucose, Bld: 112 mg/dL — ABNORMAL HIGH (ref 70–99)
POTASSIUM: 4.4 meq/L (ref 3.5–5.1)
SODIUM: 141 meq/L (ref 135–145)

## 2017-11-16 LAB — POCT GLYCOSYLATED HEMOGLOBIN (HGB A1C): HEMOGLOBIN A1C: 6.5 % — AB (ref 4.0–5.6)

## 2017-11-16 NOTE — Patient Instructions (Signed)
I would like for you you to schedule a Medicare Annual Wellness Visit (AWV).   This is a yearly appointment with our Health Coach Wynetta Fines, RN) and is designed to develop a personalized prevention plan. This is not a head to toe physical, but rather an opportunity to prevent illness based on your current health and risk factors for disease.   Visits usually last 30-60 minutes and include various screenings for hearing, vision, depression, and dementia, falls, and safety concerns. The visit also includes diet and exercise counseling and information about advance directives.   This is also an opportunity to discuss appropriate health maintenance testing such as mammography, colonoscopy, lung cancer screening, and hepatitis C testing.   The AWV is fully covered by Medicare Part B if:  . You have had Part B for over 12 months, AND . You have not had an AWV in the past 12 months .  Please don't miss out on this opportunity! Set up your appointment today!

## 2017-11-16 NOTE — Progress Notes (Signed)
  Subjective:     Patient ID: Tristan Hughes, male   DOB: 18-May-1940, 77 y.o.   MRN: [id number]  HPI Here for medical follow-up. Type 2 diabetes, obesity, hyperlipidemia, obstructive sleep apnea, hypertension, history of CAD. Doing well overall. Enjoys gardening. On insulin for diabetes with no recent hypoglycemia. No dizziness. No chest pains. Compliant with all medications. eye exam up-to-date.  No recent falls.  Mood stable.   Past Medical History:  Diagnosis Date  . CAD 07/09/2009   Cath 12-02-10 90% left main, 90%LAD, 99% circumflex, 80% RCA  . COLONIC POLYPS, HX OF 07/09/2009   Qualifier: Diagnosis of  By: Valma Cava LPN, [name]    . DIAB W/O COMP TYPE II/UNS NOT STATED UNCNTRL 07/09/2009   Qualifier: Diagnosis of  By: Joyce Gross    . History of chicken pox   . HYPERLIPIDEMIA 07/09/2009   Qualifier: Diagnosis of  By: Valma Cava LPN, [name]    . HYPERTENSION 07/09/2009   Qualifier: Diagnosis of  By: Valma Cava LPN, [name]    . OBSTRUCTIVE SLEEP APNEA 07/09/2009   Qualifier: Diagnosis of  By: Joyce Gross     Past Surgical History:  Procedure Laterality Date  . BIOPSY BREAST    . CHOLECYSTECTOMY    . CORONARY ANGIOPLASTY WITH STENT PLACEMENT    . CORONARY ARTERY BYPASS GRAFT  8/12   CABG X 4 LIMA-LAD, SVG-PDB, SVG-OM/RAMUS   /  . TONSILLECTOMY      reports that he has never smoked. He has never used smokeless tobacco. He reports that he does not drink alcohol or use drugs. family history includes Cancer in his mother; Diabetes in his mother; Heart disease in his father and mother; Hypertension in his father and mother; Stroke in his mother. Allergies  Allergen Reactions  . Naproxen Sodium     REACTION: swelling     Review of Systems  Constitutional: Negative for fatigue.  Eyes: Negative for visual disturbance.  Respiratory: Negative for cough, chest tightness and shortness of breath.   Cardiovascular: Negative for chest pain, palpitations and leg swelling.  Neurological:  Negative for dizziness, syncope, weakness, light-headedness and headaches.       Objective:   Physical Exam  Constitutional: He is oriented to person, place, and time. He appears well-developed and well-nourished.  HENT:  Right Ear: External ear normal.  Left Ear: External ear normal.  Mouth/Throat: Oropharynx is clear and moist.  Eyes: Pupils are equal, round, and reactive to light.  Neck: Neck supple. No thyromegaly present.  Cardiovascular: Normal rate and regular rhythm.  Pulmonary/Chest: Effort normal and breath sounds normal. No respiratory distress. He has no wheezes. He has no rales.  Musculoskeletal: He exhibits no edema.  Neurological: He is alert and oriented to person, place, and time.  Skin:  Feet reveal no skin lesions. Good distal foot pulses. Good capillary refill. No calluses. Normal sensation with monofilament testing        Assessment:     #1 hypertension stable and at goal  #2 hyperlipidemia with goal LDL less than 70  #3 history of CAD  #42 diabetes well-controlled A1c 6.5%    Plan:     -Continue current medications -Recheck further labs with lipid panel, hepatic panel, basic metabolic panel -Routine follow up in 6 months and sooner as needed  Eulas Post MD [facility] at [facility]

## 2017-12-29 ENCOUNTER — Other Ambulatory Visit: Payer: Self-pay | Admitting: Family Medicine

## 2018-01-01 ENCOUNTER — Other Ambulatory Visit: Payer: Self-pay | Admitting: Family Medicine

## 2018-01-01 ENCOUNTER — Encounter: Payer: Self-pay | Admitting: Family Medicine

## 2018-01-21 ENCOUNTER — Other Ambulatory Visit: Payer: Self-pay | Admitting: Family Medicine

## 2018-02-27 ENCOUNTER — Encounter: Payer: Self-pay | Admitting: Family Medicine

## 2018-02-27 ENCOUNTER — Other Ambulatory Visit: Payer: Self-pay

## 2018-02-27 ENCOUNTER — Ambulatory Visit: Payer: Medicare Other | Admitting: Family Medicine

## 2018-02-27 VITALS — BP 140/78 | HR 69 | Temp 98.4°F | Ht 68.5 in | Wt 229.4 lb

## 2018-02-27 DIAGNOSIS — R1013 Epigastric pain: Secondary | ICD-10-CM | POA: Diagnosis not present

## 2018-02-27 MED ORDER — PANTOPRAZOLE SODIUM 40 MG PO TBEC: 40.0000 mg | DELAYED_RELEASE_TABLET | Freq: Every day | ORAL | 1 refills | Status: DC

## 2018-02-27 NOTE — Progress Notes (Signed)
Subjective:     Patient ID: Tristan Hughes, male   DOB: 1941-03-13, 77 y.o.   MRN: [id number]  HPI Patient seen with some epigastric discomfort past 2 to 3 weeks.  He thinks this is stress related.  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  He states he has some burning epigastric area with no radiation.  Worse after eating -especially high acidity foods.  He has taken some Tums with temporary relief.  Denies any nausea or vomiting.  No appetite or weight changes.  No stool changes.  He had previous cholecystectomy.  No dyspnea.  No chest pain.  No exertional symptoms.  Pain is relatively constant but does wax and wane somewhat.  He takes baby aspirin 1 daily.  No other nonsteroidals.  No alcohol use no history of peptic ulcer disease.  Past Medical History:  Diagnosis Date  . CAD 07/09/2009   Cath 12-02-10 90% left main, 90%LAD, 99% circumflex, 80% RCA  . COLONIC POLYPS, HX OF 07/09/2009   Qualifier: Diagnosis of  By: Valma Cava LPN, [name]    . DIAB W/O COMP TYPE II/UNS NOT STATED UNCNTRL 07/09/2009   Qualifier: Diagnosis of  By: Joyce Gross    . History of chicken pox   . HYPERLIPIDEMIA 07/09/2009   Qualifier: Diagnosis of  By: Valma Cava LPN, [name]    . HYPERTENSION 07/09/2009   Qualifier: Diagnosis of  By: Valma Cava LPN, [name]    . OBSTRUCTIVE SLEEP APNEA 07/09/2009   Qualifier: Diagnosis of  By: Joyce Gross     Past Surgical History:  Procedure Laterality Date  . BIOPSY BREAST    . CHOLECYSTECTOMY    . CORONARY ANGIOPLASTY WITH STENT PLACEMENT    . CORONARY ARTERY BYPASS GRAFT  8/12   CABG X 4 LIMA-LAD, SVG-PDB, SVG-OM/RAMUS   /  . TONSILLECTOMY      reports that he has never smoked. He has never used smokeless tobacco. He reports that he does not drink alcohol or use drugs. family history includes Cancer in his mother; Diabetes in his mother; Heart disease in his father and mother; Hypertension in his father and  mother; Stroke in his mother. Allergies  Allergen Reactions  . Naproxen Sodium     REACTION: swelling     Review of Systems  Constitutional: Negative for appetite change, chills, fever and unexpected weight change.  HENT: Negative for trouble swallowing.   Respiratory: Negative for cough and shortness of breath.   Cardiovascular: Negative for chest pain.  Gastrointestinal: Positive for abdominal pain. Negative for blood in stool, diarrhea, nausea and vomiting.  Musculoskeletal: Negative for back pain.  Neurological: Negative for dizziness.       Objective:   Physical Exam  Constitutional: He appears well-developed and well-nourished.  Cardiovascular: Normal rate and regular rhythm.  Pulmonary/Chest: Effort normal and breath sounds normal.  Abdominal: Normal appearance and bowel sounds are normal. He exhibits no distension and no mass. There is no hepatosplenomegaly. There is no tenderness. There is no rigidity, no rebound and no guarding.       Assessment:     Patient presents with a few week history of some epigastric pain.  Differential is GERD versus gastritis.  He does not have any nausea or vomiting to suggest likely pancreatitis.  No red flags such as weight loss, melena, hematemesis.    Plan:     -Recommend dietary modification for GERD -Recommend trial of Protonix 40 mg once  daily and touch base in 2 weeks if not improving -Follow-up immediately for any vomiting, fever, progressive pain, melena -If pain totally relieved in a few weeks he will try tapering off Protonix after about a month  Eulas Post MD [facility]

## 2018-02-27 NOTE — Patient Instructions (Signed)
Food Choices for Gastroesophageal Reflux Disease, Adult When you have gastroesophageal reflux disease (GERD), the foods you eat and your eating habits are very important. Choosing the right foods can help ease the discomfort of GERD. Consider working with a diet and nutrition specialist (dietitian) to help you make healthy food choices. What general guidelines should I follow? Eating plan  Choose healthy foods low in fat, such as fruits, vegetables, whole grains, low-fat dairy products, and lean meat, fish, and poultry.  Eat frequent, small meals instead of three large meals each day. Eat your meals slowly, in a relaxed setting. Avoid bending over or lying down until 2-3 hours after eating.  Limit high-fat foods such as fatty meats or fried foods.  Limit your intake of oils, butter, and shortening to less than 8 teaspoons each day.  Avoid the following: ? Foods that cause symptoms. These may be different for different people. Keep a food diary to keep track of foods that cause symptoms. ? Alcohol. ? Drinking large amounts of liquid with meals. ? Eating meals during the 2-3 hours before bed.  Cook foods using methods other than frying. This may include baking, grilling, or broiling. Lifestyle   Maintain a healthy weight. Ask your health care provider what weight is healthy for you. If you need to lose weight, work with your health care provider to do so safely.  Exercise for at least 30 minutes on 5 or more days each week, or as told by your health care provider.  Avoid wearing clothes that fit tightly around your waist and chest.  Do not use any products that contain nicotine or tobacco, such as cigarettes and e-cigarettes. If you need help quitting, ask your health care provider.  Sleep with the head of your bed raised. Use a wedge under the mattress or blocks under the bed frame to raise the head of the bed. What foods are not recommended? The items listed may not be a complete  list. Talk with your dietitian about what dietary choices are best for you. Grains Pastries or quick breads with added fat. French toast. Vegetables Deep fried vegetables. French fries. Any vegetables prepared with added fat. Any vegetables that cause symptoms. For some people this may include tomatoes and tomato products, chili peppers, onions and garlic, and horseradish. Fruits Any fruits prepared with added fat. Any fruits that cause symptoms. For some people this may include citrus fruits, such as oranges, grapefruit, pineapple, and lemons. Meats and other protein foods High-fat meats, such as fatty beef or pork, hot dogs, ribs, ham, sausage, salami and bacon. Fried meat or protein, including fried fish and fried chicken. Nuts and nut butters. Dairy Whole milk and chocolate milk. Sour cream. Cream. Ice cream. Cream cheese. Milk shakes. Beverages Coffee and tea, with or without caffeine. Carbonated beverages. Sodas. Energy drinks. Fruit juice made with acidic fruits (such as orange or grapefruit). Tomato juice. Alcoholic drinks. Fats and oils Butter. Margarine. Shortening. Ghee. Sweets and desserts Chocolate and cocoa. Donuts. Seasoning and other foods Pepper. Peppermint and spearmint. Any condiments, herbs, or seasonings that cause symptoms. For some people, this may include curry, hot sauce, or vinegar-based salad dressings. Summary  When you have gastroesophageal reflux disease (GERD), food and lifestyle choices are very important to help ease the discomfort of GERD.  Eat frequent, small meals instead of three large meals each day. Eat your meals slowly, in a relaxed setting. Avoid bending over or lying down until 2-3 hours after eating.  Limit high-fat   foods such as fatty meat or fried foods. This information is not intended to replace advice given to you by your health care provider. Make sure you discuss any questions you have with your health care provider. Document Released:  04/17/2005 Document Revised: 04/18/2016 Document Reviewed: 04/18/2016 Elsevier Interactive Patient Education  2018 [employer] about 30 minutes prior to biggest meal of day  Let me know in 2-3 weeks if not better  Follow up sooner for vomiting, black stools, or progressive pain.  Consider tapering off in one month if better.

## 2018-03-01 ENCOUNTER — Ambulatory Visit: Payer: Medicare Other | Admitting: Family Medicine

## 2018-03-06 ENCOUNTER — Telehealth: Payer: Self-pay | Admitting: Cardiology

## 2018-03-13 NOTE — Telephone Encounter (Signed)
done

## 2018-03-25 ENCOUNTER — Encounter: Payer: Self-pay | Admitting: Family Medicine

## 2018-03-25 DIAGNOSIS — R1013 Epigastric pain: Secondary | ICD-10-CM

## 2018-04-03 ENCOUNTER — Encounter: Payer: Self-pay | Admitting: Family Medicine

## 2018-04-08 ENCOUNTER — Encounter: Payer: Self-pay | Admitting: Family Medicine

## 2018-04-09 ENCOUNTER — Encounter: Payer: Self-pay | Admitting: Physician Assistant

## 2018-04-10 ENCOUNTER — Encounter: Payer: Self-pay | Admitting: Family Medicine

## 2018-04-10 NOTE — Progress Notes (Signed)
Cardiology Office Note:    Date:  04/11/2018   ID:  Tristan Hughes, DOB [birth year], MRN [id number]  PCP:  Eulas Post, MD  Cardiologist:  Shirlee More, MD    Referring MD: Eulas Post, MD    ASSESSMENT:    1. 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 Essential hypertension   3. Hyperlipidemia, unspecified hyperlipidemia type   4. Controlled type 2 diabetes mellitus without complication, with long-term current use of insulin (HCC)    PLAN:    In order of problems listed above:  1. He has chronic stable CAD New York Heart Association class I and I asked him to continue medical treatment including aspirin beta-blocker and his high intensity statin along with Zetia and after discussion of benefits and options neither of [place] feel he requires an ischemia evaluation at this time.  Plan to follow-up in 1 year or sooner if he is having recurrent angina 2. Stable BP at target continue current treatment including calcium channel blocker as well as low-dose diuretic with edema likely related to him along with his ACE inhibitor.  Recent labs reviewed from February and he tells me were performed at his primary care physician's office family 6 months he had normal renal function. 3. Stable lipids are ideal recent lipid profile shows an LDL 45 cholesterol 104 HDL 39 and continue his high intensity statin and Zetia 4. Stable last A1c is 6.5 continue current treatment for diabetes managed by his PCP   Next appointment: 1 year   Medication Adjustments/Labs and Tests Ordered: Current medicines are reviewed at length with the patient today.  Concerns regarding medicines are outlined above.  No orders of the defined types were placed in this encounter.  No orders of the defined types were placed in this encounter.   No chief complaint on file.   History of Present Illness:    Tristan Hughes is a 77 y.o. male with a hx of CAD  with CABG 2012, hypertension. sleep apnea and hyperlipidemia last seen 04/16/17. Compliance with diet, lifestyle and medications: yes  Is pleased with the quality of his life gets 150 minutes of activity per week and has had no new symptoms of exercise intolerance chest pain shortness of breath palpitations syncope or TIA.  Compliant with and tolerates his medications he does notice a little bit of dependent edema related to calcium channel blocker and takes low-dose diuretic he does not have a history of heart failure Past Medical History:  Diagnosis Date  . CAD 07/09/2009   Cath 12-02-10 90% left main, 90%LAD, 99% circumflex, 80% RCA  . COLONIC POLYPS, HX OF 07/09/2009   Qualifier: Diagnosis of  By: Valma Cava LPN, [name]    . DIAB W/O COMP TYPE II/UNS NOT STATED UNCNTRL 07/09/2009   Qualifier: Diagnosis of  By: Joyce Gross    . History of chicken pox   . HYPERLIPIDEMIA 07/09/2009   Qualifier: Diagnosis of  By: Valma Cava LPN, [name]    . HYPERTENSION 07/09/2009   Qualifier: Diagnosis of  By: Valma Cava LPN, [name]    . OBSTRUCTIVE SLEEP APNEA 07/09/2009   Qualifier: Diagnosis of  By: Joyce Gross      Past Surgical History:  Procedure Laterality Date  . BIOPSY BREAST    . CHOLECYSTECTOMY    . CORONARY ANGIOPLASTY WITH STENT PLACEMENT    . CORONARY ARTERY BYPASS GRAFT  8/12   CABG X 4 LIMA-LAD, SVG-PDB, SVG-OM/RAMUS   /  .  TONSILLECTOMY      Current Medications: Current Meds  Medication Sig  . amLODipine (NORVASC) 5 MG tablet TAKE 1 TABLET DAILY  . Ascorbic Acid (VITAMIN C) 100 MG tablet Take 100 mg by mouth daily.  Marland Kitchen aspirin 81 MG tablet Take 81 mg by mouth daily.  . Cholecalciferol (VITAMIN D) 2000 UNITS CAPS Take 5,000 Units by mouth daily.   Marland Kitchen ezetimibe (ZETIA) 10 MG tablet TAKE 1 TABLET DAILY  . furosemide (LASIX) 20 MG tablet TAKE 1 TABLET TWICE A DAY  . Horse Chestnut 300 MG CAPS Take 300 mg by mouth 2 (two) times daily.  Marland Kitchen HUMALOG 100 UNIT/ML injection INJECT 15 TO 18 UNITS  UNDER THE SKIN THREE TIMES A DAY BEFORE MEALS  . Insulin Syringe-Needle U-100 (B-D INS SYR HALF-UNIT .3CC/31G) 31G X 5/16" 0.3 ML MISC by Does not apply route.  . Insulin Syringe-Needle U-100 31G X 5/16" 1 ML MISC Use once daily  . LANTUS 100 UNIT/ML injection INJECT 46 UNITS UNDER THE SKIN AT BEDTIME  . lisinopril (PRINIVIL,ZESTRIL) 20 MG tablet Take 10 mg by mouth daily.  . metFORMIN (GLUCOPHAGE) 1000 MG tablet TAKE 1 TABLET TWICE A DAY WITH MEALS  . metoprolol tartrate (LOPRESSOR) 50 MG tablet TAKE ONE AND ONE-HALF TABLETS TWICE A DAY (SCHEDULE A PHYSICAL FOR MORE REFILLS)  . rosuvastatin (CRESTOR) 20 MG tablet TAKE 1 TABLET DAILY (PLEASE SCHEDULE A PHYSICAL FOR MORE REFILLS)  . vitamin E (VITAMIN E) 400 UNIT capsule Takes one tablet by mouth daily. (Patient taking differently: Take 400 Units by mouth daily. Takes one tablet by mouth daily.)     Allergies:   Naproxen sodium   Social History   Socioeconomic History  . Marital status: Married    Spouse name: Not on file  . Number of children: Not on file  . Years of education: Not on file  . Highest education level: Not on file  Occupational History  . Not on file  Social Needs  . Financial resource strain: Not on file  . Food insecurity:    Worry: Not on file    Inability: Not on file  . Transportation needs:    Medical: Not on file    Non-medical: Not on file  Tobacco Use  . Smoking status: Never Smoker  . Smokeless tobacco: Never Used  Substance and Sexual Activity  . Alcohol use: No  . Drug use: No  . Sexual activity: Not on file  Lifestyle  . Physical activity:    Days per week: Not on file    Minutes per session: Not on file  . Stress: Not on file  Relationships  . Social connections:    Talks on phone: Not on file    Gets together: Not on file    Attends religious service: Not on file    Active member of club or organization: Not on file    Attends meetings of clubs or organizations: Not on file     Relationship status: Not on file  Other Topics Concern  . Not on file  Social History Narrative  . Not on file     Family History: The patient's family history includes Cancer in his mother; Diabetes in his mother; Heart disease in his father and mother; Hypertension in his father and mother; Stroke in his mother. ROS:   Please see the history of present illness.    All other systems reviewed and are negative.  EKGs/Labs/Other Studies Reviewed:    The following studies were  reviewed today:  EKG:  EKG ordered today.  The ekg ordered today demonstrates [place] normal  Recent Labs: 11/16/2017: ALT 20; BUN 18; Creatinine, Ser 0.93; Potassium 4.4; Sodium 141  Recent Lipid Panel    Component Value Date/Time   CHOL 104 11/16/2017 0932   TRIG 98.0 11/16/2017 0932   HDL 39.60 11/16/2017 0932   CHOLHDL 3 11/16/2017 0932   VLDL 19.6 11/16/2017 0932   LDLCALC 45 11/16/2017 0932    Physical Exam:    VS:  BP 132/66 (BP Location: Right Arm, Patient Position: Sitting, Cuff Size: Large)   Pulse (!) 59   Ht 5\' 9"  (1.753 m)   Wt 229 lb 6.4 oz (104.1 kg)   SpO2 97%   BMI 33.88 kg/m     Wt Readings from Last 3 Encounters:  04/11/18 229 lb 6.4 oz (104.1 kg)  02/27/18 229 lb 6.4 oz (104.1 kg)  11/16/17 229 lb 14.4 oz (104.3 kg)     GEN:  Well nourished, well developed in no acute distress HEENT: Normal NECK: No JVD; No carotid bruits LYMPHATICS: No lymphadenopathy CARDIAC: RRR, no murmurs, rubs, gallops RESPIRATORY:  Clear to auscultation without rales, wheezing or rhonchi  ABDOMEN: Soft, non-tender, non-distended MUSCULOSKELETAL:  No edema; No deformity  SKIN: Warm and dry NEUROLOGIC:  Alert and oriented x 3 PSYCHIATRIC:  Normal affect    Signed, Shirlee More, MD  04/11/2018 10:22 AM    [facility]

## 2018-04-11 ENCOUNTER — Encounter: Payer: Self-pay | Admitting: Cardiology

## 2018-04-11 ENCOUNTER — Ambulatory Visit (INDEPENDENT_AMBULATORY_CARE_PROVIDER_SITE_OTHER): Payer: Medicare Other | Admitting: Cardiology

## 2018-04-11 VITALS — BP 132/66 | HR 59 | Ht 69.0 in | Wt 229.4 lb

## 2018-04-11 DIAGNOSIS — I10 Essential (primary) hypertension: Secondary | ICD-10-CM

## 2018-04-11 DIAGNOSIS — E119 Type 2 diabetes mellitus without complications: Secondary | ICD-10-CM | POA: Diagnosis not present

## 2018-04-11 DIAGNOSIS — E785 Hyperlipidemia, unspecified: Secondary | ICD-10-CM | POA: Diagnosis not present

## 2018-04-11 DIAGNOSIS — Z794 Long term (current) use of insulin: Secondary | ICD-10-CM

## 2018-04-11 DIAGNOSIS — I25810 Atherosclerosis of coronary artery bypass graft(s) without angina pectoris: Secondary | ICD-10-CM

## 2018-04-11 NOTE — Patient Instructions (Addendum)
Medication Instructions:  Your physician recommends that you continue on your current medications as directed. Please refer to the Current Medication list given to you today.  If you need a refill on your cardiac medications before your next appointment, please call your pharmacy.   Dr Bettina Gavia recommends that you exercise 150 minutes a week.    Lab work: NONE If you have labs (blood work) drawn today and your tests are completely normal, you will receive your results only by: Marland Kitchen MyChart Message (if you have MyChart) OR . A paper copy in the mail If you have any lab test that is abnormal or we need to change your treatment, we will call you to review the results.  Testing/Procedures: You had an EKG  Follow-Up: At [facility], you and your health needs are our priority.  As part of our continuing mission to provide you with exceptional heart care, we have created designated Provider Care Teams.  These Care Teams include your primary Cardiologist (physician) and Advanced Practice Providers (APPs -  Physician Assistants and Nurse Practitioners) who all work together to provide you with the care you need, when you need it. You will need a follow up appointment in 1 year.  Please call our office 2 months in advance to schedule this appointment.

## 2018-04-15 ENCOUNTER — Ambulatory Visit: Payer: Medicare Other | Admitting: Physician Assistant

## 2018-04-17 ENCOUNTER — Ambulatory Visit (INDEPENDENT_AMBULATORY_CARE_PROVIDER_SITE_OTHER): Payer: Medicare Other | Admitting: Physician Assistant

## 2018-04-17 ENCOUNTER — Encounter: Payer: Self-pay | Admitting: Physician Assistant

## 2018-04-17 VITALS — BP 123/70 | HR 60 | Wt 226.0 lb

## 2018-04-17 DIAGNOSIS — R142 Eructation: Secondary | ICD-10-CM | POA: Diagnosis not present

## 2018-04-17 DIAGNOSIS — R1013 Epigastric pain: Secondary | ICD-10-CM

## 2018-04-17 MED ORDER — FAMOTIDINE 20 MG PO TABS: ORAL_TABLET | ORAL | 2 refills | Status: DC

## 2018-04-17 NOTE — Patient Instructions (Addendum)
We sent a prescription to [place], for Pepcid 20mg  tablets.  Continue Tums as needed.  You have been scheduled for an endoscopy. Please follow written instructions given to you at your visit today. If you use inhalers (even only as needed), please bring them with you on the day of your procedure.  Normal BMI (Body Mass Index- based on height and weight) is between 23 and 30. Your BMI today is Body mass index is 33.37 kg/m. Marland Kitchen Please consider follow up  regarding your BMI with your Primary Care Provider.

## 2018-04-17 NOTE — Progress Notes (Signed)
Chief Complaint: Epigastric pain  HPI:    [name] is a 77 year old male, who was referred to me by Tristan Post, MD for a complaint of epigastric pain.      05/18/2017 colonoscopy with Dr. Collene Hughes for personal history of colon polyps with a 6 mm sessile polyp in the distal descending colon, small internal hemorrhoids and otherwise normal exam.  Repeat was not recommended due to age.    02/27/2018 office visit with PT PCP to discuss epigastric discomfort for 2 to 3 weeks which he thought was possibly stress related.  At that time was given dietary modification for reflux and given a trial of Protonix 40 mg once daily.    Today, patient presents to clinic accompanied by his wife and explains that since the middle of October he has developed an epigastric discomfort which radiates across his upper chest under his rib cage.  This is associated with a lot of belching.  Describes the discomfort as a squeezing sensation.  This can come and go throughout the day and seems unrelated to eating, but sometimes is worsened when he eats, they have not been able to figure out a pattern of foods which bother it yet.  When it happens patient eats Tums which helps a little bit.  It sometimes wakes him up at night when he will also eat some Tums which helps a little bit.  It can last for 15 minutes at a time or slightly longer.  Overall patient has a decrease in appetite as he is afraid it may cause discomfort.  His bowel movements are regular and he denies any NSAIDs.  No weight loss.  Did try Pantoprazole 40 mg once daily for 3 weeks it made no change to his symptoms.    Denies fever, chills, nausea, vomiting or melena.  Past Medical History:  Diagnosis Date  . CAD 07/09/2009   Cath 12-02-10 90% left main, 90%LAD, 99% circumflex, 80% RCA  . COLONIC POLYPS, HX OF 07/09/2009   Qualifier: Diagnosis of  By: Tristan Hughes, Tristan Hughes    . DIAB W/O COMP TYPE II/UNS NOT STATED UNCNTRL 07/09/2009   Qualifier: Diagnosis of  By:  Tristan Hughes    . History of chicken pox   . HYPERLIPIDEMIA 07/09/2009   Qualifier: Diagnosis of  By: Tristan Hughes, Tristan Hughes    . HYPERTENSION 07/09/2009   Qualifier: Diagnosis of  By: Tristan Hughes, Tristan Hughes    . OBSTRUCTIVE SLEEP APNEA 07/09/2009   Qualifier: Diagnosis of  By: Tristan Hughes      Past Surgical History:  Procedure Laterality Date  . BIOPSY BREAST    . CHOLECYSTECTOMY    . CORONARY ANGIOPLASTY WITH STENT PLACEMENT    . CORONARY ARTERY BYPASS GRAFT  8/12   CABG X 4 LIMA-LAD, SVG-PDB, SVG-OM/RAMUS   /  . TONSILLECTOMY      Current Outpatient Medications  Medication Sig Dispense Refill  . calcium carbonate (TUMS - DOSED IN MG ELEMENTAL CALCIUM) 500 MG chewable tablet Chew 1 tablet by mouth daily.    Marland Kitchen amLODipine (NORVASC) 5 MG tablet TAKE 1 TABLET DAILY 90 tablet 1  . Ascorbic Acid (VITAMIN C) 100 MG tablet Take 100 mg by mouth daily.    Marland Kitchen aspirin 81 MG tablet Take 81 mg by mouth daily.    . Cholecalciferol (VITAMIN D) 2000 UNITS CAPS Take 5,000 Units by mouth daily.     Marland Kitchen ezetimibe (ZETIA) 10 MG tablet TAKE 1 TABLET DAILY 90 tablet 2  .  furosemide (LASIX) 20 MG tablet TAKE 1 TABLET TWICE A DAY 180 tablet 3  . Horse Chestnut 300 MG CAPS Take 300 mg by mouth 2 (two) times daily.    Marland Kitchen HUMALOG 100 UNIT/ML injection INJECT 15 TO 18 UNITS UNDER THE SKIN THREE TIMES A DAY BEFORE MEALS 50 mL 2  . Insulin Syringe-Needle U-100 (B-D INS SYR HALF-UNIT .3CC/31G) 31G X 5/16" 0.3 ML MISC by Does not apply route.    . Insulin Syringe-Needle U-100 31G X 5/16" 1 ML MISC Use once daily 100 each 3  . LANTUS 100 UNIT/ML injection INJECT 46 UNITS UNDER THE SKIN AT BEDTIME 50 mL 5  . lisinopril (PRINIVIL,ZESTRIL) 20 MG tablet Take 10 mg by mouth daily.    . metFORMIN (GLUCOPHAGE) 1000 MG tablet TAKE 1 TABLET TWICE A DAY WITH MEALS 180 tablet 4  . metoprolol tartrate (LOPRESSOR) 50 MG tablet TAKE ONE AND ONE-HALF TABLETS TWICE A DAY (SCHEDULE A PHYSICAL FOR MORE REFILLS) 270 tablet 4  .  rosuvastatin (CRESTOR) 20 MG tablet TAKE 1 TABLET DAILY (PLEASE SCHEDULE A PHYSICAL FOR MORE REFILLS) 90 tablet 3  . vitamin E (VITAMIN E) 400 UNIT capsule Takes one tablet by mouth daily. (Patient taking differently: Take 400 Units by mouth daily. Takes one tablet by mouth daily.)     No current facility-administered medications for this visit.     Allergies as of 04/17/2018 - Review Complete 04/17/2018  Allergen Reaction Noted  . Naproxen sodium  07/09/2009    Family History  Problem Relation Age of Onset  . Hypertension Mother   . Heart disease Mother   . Stroke Mother   . Diabetes Mother   . Cancer Mother        pancreatic  . Heart disease Father   . Hypertension Father     Social History   Socioeconomic History  . Marital status: Married    Spouse name: Not on file  . Number of children: Not on file  . Years of education: Not on file  . Highest education level: Not on file  Occupational History  . Not on file  Social Needs  . Financial resource strain: Not on file  . Food insecurity:    Worry: Not on file    Inability: Not on file  . Transportation needs:    Medical: Not on file    Non-medical: Not on file  Tobacco Use  . Smoking status: Never Smoker  . Smokeless tobacco: Never Used  Substance and Sexual Activity  . Alcohol use: No  . Drug use: No  . Sexual activity: Not on file  Lifestyle  . Physical activity:    Days per week: Not on file    Minutes per session: Not on file  . Stress: Not on file  Relationships  . Social connections:    Talks on phone: Not on file    Gets together: Not on file    Attends religious service: Not on file    Active member of club or organization: Not on file    Attends meetings of clubs or organizations: Not on file    Relationship status: Not on file  . Intimate partner violence:    Fear of current or ex partner: Not on file    Emotionally abused: Not on file    Physically abused: Not on file    Forced sexual  activity: Not on file  Other Topics Concern  . Not on file  Social History Narrative  .  Not on file    Review of Systems:    Constitutional: No weight loss, fever or chills Skin: No rash  Cardiovascular: No chest pain Respiratory: No SOB  Gastrointestinal: See HPI and otherwise negative Genitourinary: No dysuria  Neurological: No headache, dizziness or syncope Musculoskeletal: No new muscle or joint pain Hematologic: No bleeding  Psychiatric: No history of depression or anxiety   Physical Exam:  Vital signs: BP 123/70   Pulse 60   Wt 226 lb (102.5 kg)   SpO2 97%   BMI 33.37 kg/m   Constitutional:   Pleasant Caucasian male appears to be in NAD, Well developed, Well nourished, alert and cooperative Head:  Normocephalic and atraumatic. Eyes:   PEERL, EOMI. No icterus. Conjunctiva pink. Ears:  Normal auditory acuity. Neck:  Supple Throat: Oral cavity and pharynx without inflammation, swelling or lesion.  Respiratory: Respirations even and unlabored. Lungs clear to auscultation bilaterally.   No wheezes, crackles, or rhonchi.  Cardiovascular: Normal S1, S2. No MRG. Regular rate and rhythm. No peripheral edema, cyanosis or pallor.  Gastrointestinal:  Soft, nondistended, mild epigastric ttp. No rebound or guarding. Normal bowel sounds. No appreciable masses or hepatomegaly. Rectal:  Not performed.  Msk:  Symmetrical without Hughes deformities. Without edema, no deformity or joint abnormality.  Neurologic:  Alert and  oriented x4;  grossly normal neurologically.  Skin:   Dry and intact without significant lesions or rashes. Psychiatric:  Demonstrates good judgement and reason without abnormal affect or behaviors.  MOST RECENT LABS AND IMAGING: CBC    Component Value Date/Time   WBC 9.5 12/15/2016 2043   RBC 5.44 12/15/2016 2043   HGB 16.2 12/15/2016 2043   HCT 48.1 12/15/2016 2043   PLT 165 12/15/2016 2043   MCV 88.4 12/15/2016 2043   MCH 29.8 12/15/2016 2043   MCHC 33.7  12/15/2016 2043   RDW 13.5 12/15/2016 2043   LYMPHSABS 0.9 08/03/2009 1005   MONOABS 0.7 08/03/2009 1005   EOSABS 0.1 08/03/2009 1005   BASOSABS 0.0 08/03/2009 1005    CMP     Component Value Date/Time   NA 141 11/16/2017 0932   K 4.4 11/16/2017 0932   CL 104 11/16/2017 0932   CO2 31 11/16/2017 0932   GLUCOSE 112 (H) 11/16/2017 0932   BUN 18 11/16/2017 0932   CREATININE 0.93 11/16/2017 0932   CALCIUM 8.9 11/16/2017 0932   PROT 6.7 11/16/2017 0932   ALBUMIN 4.0 11/16/2017 0932   AST 17 11/16/2017 0932   ALT 20 11/16/2017 0932   ALKPHOS 50 11/16/2017 0932   BILITOT 1.0 11/16/2017 0932   GFRNONAA >60 12/15/2016 2043   GFRAA >60 12/15/2016 2043    Assessment: 1.  Epigastric pain: Pain, unhelped by Pantoprazole 40 mg for 3 weeks, some help from Tums, can awaken him at night, no reflux symptoms per him, no change in bowel habits; consider gastritis +/-PUD +/- H. pylori 2.  Eructations  Plan: 1.  Patient for an EGD in the [facility] with Dr. Fuller Plan.  Did discuss risk, benefits, limitations and alternatives and the patient agrees to proceed. 2.  Prescribed Pepcid 20 mg twice daily, every morning and nightly #60 with 3 refills 3.  Reviewed antireflux diet and lifestyle modifications. 4.  Patient to follow in clinic per recommendations from Dr. Fuller Plan after time of procedure.  Ellouise Newer, PA-C [facility] 04/17/2018, 9:47 AM  Cc: Tristan Post, MD

## 2018-04-17 NOTE — Progress Notes (Signed)
Reviewed and agree with initial management plan.  Coby Shrewsberry T. Quayshawn Nin, MD FACG 

## 2018-04-22 ENCOUNTER — Ambulatory Visit (AMBULATORY_SURGERY_CENTER): Payer: Medicare Other | Admitting: Gastroenterology

## 2018-04-22 ENCOUNTER — Other Ambulatory Visit (INDEPENDENT_AMBULATORY_CARE_PROVIDER_SITE_OTHER): Payer: Medicare Other

## 2018-04-22 ENCOUNTER — Encounter: Payer: Self-pay | Admitting: Gastroenterology

## 2018-04-22 ENCOUNTER — Telehealth: Payer: Self-pay | Admitting: Gastroenterology

## 2018-04-22 ENCOUNTER — Encounter: Payer: Self-pay | Admitting: Family Medicine

## 2018-04-22 ENCOUNTER — Other Ambulatory Visit: Payer: Self-pay | Admitting: Gastroenterology

## 2018-04-22 VITALS — BP 118/56 | HR 55 | Temp 96.9°F | Resp 13 | Ht 69.0 in | Wt 226.0 lb

## 2018-04-22 DIAGNOSIS — K259 Gastric ulcer, unspecified as acute or chronic, without hemorrhage or perforation: Secondary | ICD-10-CM | POA: Diagnosis present

## 2018-04-22 DIAGNOSIS — D490 Neoplasm of unspecified behavior of digestive system: Secondary | ICD-10-CM

## 2018-04-22 DIAGNOSIS — C160 Malignant neoplasm of cardia: Secondary | ICD-10-CM

## 2018-04-22 DIAGNOSIS — R1013 Epigastric pain: Secondary | ICD-10-CM

## 2018-04-22 LAB — COMPREHENSIVE METABOLIC PANEL
ALT: 16 U/L (ref 0–53)
AST: 18 U/L (ref 0–37)
Albumin: 4.1 g/dL (ref 3.5–5.2)
Alkaline Phosphatase: 46 U/L (ref 39–117)
BUN: 18 mg/dL (ref 6–23)
CO2: 34 mEq/L — ABNORMAL HIGH (ref 19–32)
CREATININE: 1.09 mg/dL (ref 0.40–1.50)
Calcium: 9.6 mg/dL (ref 8.4–10.5)
Chloride: 101 mEq/L (ref 96–112)
GFR: 69.78 mL/min (ref >60.00)
Glucose, Bld: 117 mg/dL — ABNORMAL HIGH (ref 70–99)
Potassium: 4.4 mEq/L (ref 3.5–5.1)
Sodium: 140 mEq/L (ref 135–145)
Total Bilirubin: 0.9 mg/dL (ref 0.2–1.2)
Total Protein: 6.8 g/dL (ref 6.0–8.3)

## 2018-04-22 LAB — CBC WITH DIFFERENTIAL/PLATELET
Basophils Absolute: 0.1 10*3/uL (ref 0.0–0.1)
Basophils Relative: 1 % (ref 0.0–3.0)
Eosinophils Absolute: 0.1 10*3/uL (ref 0.0–0.7)
Eosinophils Relative: 1.4 % (ref 0.0–5.0)
HCT: 46.3 % (ref 39.0–52.0)
Hemoglobin: 15.9 g/dL (ref 13.0–17.0)
Lymphocytes Relative: 17.6 % (ref 12.0–46.0)
Lymphs Abs: 1.2 10*3/uL (ref 0.7–4.0)
MCHC: 34.5 g/dL (ref 30.0–36.0)
MCV: 88.1 fl (ref 78.0–100.0)
Monocytes Absolute: 0.8 10*3/uL (ref 0.1–1.0)
Monocytes Relative: 11.4 % (ref 3.0–12.0)
Neutro Abs: 4.7 10*3/uL (ref 1.4–7.7)
Neutrophils Relative %: 68.6 % (ref 43.0–77.0)
PLATELETS: 175 10*3/uL (ref 150.0–400.0)
RBC: 5.25 Mil/uL (ref 4.22–5.81)
RDW: 13.2 % (ref 11.5–15.5)
WBC: 6.9 10*3/uL (ref 4.0–10.5)

## 2018-04-22 MED ORDER — SODIUM CHLORIDE 0.9 % IV SOLN: 500.0000 mL | Freq: Once | INTRAVENOUS | Status: DC

## 2018-04-22 MED ORDER — TRAMADOL HCL 50 MG PO TABS: 50.0000 mg | ORAL_TABLET | Freq: Three times a day (TID) | ORAL | 0 refills | Status: DC | PRN

## 2018-04-22 NOTE — Progress Notes (Signed)
Called to room to assist during endoscopic procedure.  Patient ID and intended procedure confirmed with present staff. Received instructions for my participation in the procedure from the performing physician.   Per Dr. Fuller Plan send path specimens regular path. maw

## 2018-04-22 NOTE — Patient Instructions (Signed)
YOU HAD AN ENDOSCOPIC PROCEDURE TODAY AT THE [facility]:   Refer to the procedure report that was given to you for any specific questions about what was found during the examination.  If the procedure report does not answer your questions, please call your gastroenterologist to clarify.  If you requested that your care partner not be given the details of your procedure findings, then the procedure report has been included in a sealed envelope for you to review at your convenience later.  YOU SHOULD EXPECT: Some feelings of bloating in the abdomen. Passage of more gas than usual.  Walking can help get rid of the air that was put into your GI tract during the procedure and reduce the bloating. If you had a lower endoscopy (such as a colonoscopy or flexible sigmoidoscopy) you may notice spotting of blood in your stool or on the toilet paper. If you underwent a bowel prep for your procedure, you may not have a normal bowel movement for a few days.  Please Note:  You might notice some irritation and congestion in your nose or some drainage.  This is from the oxygen used during your procedure.  There is no need for concern and it should clear up in a day or so.  SYMPTOMS TO REPORT IMMEDIATELY:   Following upper endoscopy (EGD)  Vomiting of blood or coffee ground material  New chest pain or pain under the shoulder blades  Painful or persistently difficult swallowing  New shortness of breath  Fever of 100F or higher  Black, tarry-looking stools  For urgent or emergent issues, a gastroenterologist can be reached at any hour by calling [phone].   DIET:  We do recommend a small meal at first, but then you may proceed to your regular diet.  Drink plenty of fluids but you should avoid alcoholic beverages for 24 hours.  ACTIVITY:  You should plan to take it easy for the rest of today and you should NOT DRIVE or use heavy machinery until tomorrow (because of the sedation medicines used  during the test).    FOLLOW UP: Our staff will call the number listed on your records the next business day following your procedure to check on you and address any questions or concerns that you may have regarding the information given to you following your procedure. If we do not reach you, we will leave a message.  However, if you are feeling well and you are not experiencing any problems, there is no need to return our call.  We will assume that you have returned to your regular daily activities without incident.  If any biopsies were taken you will be contacted by phone or by letter within the next 1-3 weeks.  Please call us at [phone] if you have not heard about the biopsies in 3 weeks.    SIGNATURES/CONFIDENTIALITY: You and/or your care partner have signed paperwork which will be entered into your electronic medical record.  These signatures attest to the fact that that the information above on your After Visit Summary has been reviewed and is understood.  Full responsibility of the confidentiality of this discharge information lies with you and/or your care-partner. 

## 2018-04-22 NOTE — Op Note (Addendum)
[place] Patient Name: Tristan Hughes Procedure Date: 04/22/2018 2:20 PM MRN: [id number] Endoscopist: Ladene Artist , MD Age: 77 Referring MD:  Date of Birth: [birth year] Gender: Male Account #: [id number] Procedure:                Upper GI endoscopy Indications:              Epigastric abdominal pain Medicines:                Monitored Anesthesia Care Procedure:                Pre-Anesthesia Assessment:                           - Prior to the procedure, a History and Physical                            was performed, and patient medications and                            allergies were reviewed. The patient's tolerance of                            previous anesthesia was also reviewed. The risks                            and benefits of the procedure and the sedation                            options and risks were discussed with the patient.                            All questions were answered, and informed consent                            was obtained. Prior Anticoagulants: The patient has                            taken no previous anticoagulant or antiplatelet                            agents. ASA Grade Assessment: II - A patient with                            mild systemic disease. After reviewing the risks                            and benefits, the patient was deemed in                            satisfactory condition to undergo the procedure.                           After obtaining informed consent, the endoscope was  passed under direct vision. Throughout the                            procedure, the patient's blood pressure, pulse, and                            oxygen saturations were monitored continuously. The                            Endoscope was introduced through the mouth, and                            advanced to the second part of duodenum. The upper                            GI endoscopy was accomplished  without difficulty.                            The patient tolerated the procedure well. Scope In: Scope Out: Findings:                 An infiltrative and ulcerated, non-circumferential,                            friable mass with no bleeding and no stigmata of                            recent bleeding involving the lesser curvature of                            the stomach and the cardia measuring approximately                            4 cm x 2 cm. Biopsies were taken with a cold                            forceps for histology.                           The exam of the stomach was otherwise normal.                           One superficial distal esophageal ulcer which was                            an extention of the ulcerated gastric mass with no                            bleeding and no stigmata of recent bleeding was                            found 41 cm from the incisors at the EGJ .  The exam of the esophagus was otherwise normal.                           A small non-bleeding diverticulum was found in the                            second portion of the duodenum.                           The exam of the duodenum was otherwise normal. Complications:            No immediate complications. Estimated Blood Loss:     Estimated blood loss was minimal. 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 Biopsied.                           - Non-bleeding esophageal ulcer.                           - Non-bleeding duodenal diverticulum. Recommendation:           - Patient has a contact number available for                            emergencies. The signs and symptoms of potential                            delayed complications were discussed with the                            patient. Return to normal activities tomorrow.                             Written discharge instructions were provided to the                            patient.                           - Resume previous diet.                           - Continue present medications.                           - Gas-X tid prn.                           - CBC, CMP today                           -  Perform a CT scan (computed tomography) of chest                            with contrast, abdomen with contrast and pelvis                            with contrast at appointment to be scheduled.                           - Oncology referral to be scheduled.                           - Await pathology results.                           - Tramadol 50 mg po tid prn pain, #30, no refills. Ladene Artist, MD 04/22/2018 2:49:56 PM This report has been signed electronically.

## 2018-04-22 NOTE — Progress Notes (Signed)
Report to PACU, RN, vss, BBS= Clear.  

## 2018-04-22 NOTE — Progress Notes (Signed)
Patient does not have contrast for CT with him, if schedules after biopsy results are back, he will need to get contrast and directions.

## 2018-04-23 ENCOUNTER — Telehealth: Payer: Self-pay

## 2018-04-23 DIAGNOSIS — R1013 Epigastric pain: Secondary | ICD-10-CM

## 2018-04-23 DIAGNOSIS — D490 Neoplasm of unspecified behavior of digestive system: Secondary | ICD-10-CM

## 2018-04-23 DIAGNOSIS — R142 Eructation: Secondary | ICD-10-CM

## 2018-04-23 NOTE — Telephone Encounter (Signed)
NO ANSWER, MESSAGE LEFT FOR PATIENT. 

## 2018-04-23 NOTE — Telephone Encounter (Signed)
-----   Message from Ladene Artist, MD sent at 04/22/2018  3:04 PM EST ----- Please see EGD report. 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 Please try to get CT and oncology referral set up very soon.

## 2018-04-23 NOTE — Telephone Encounter (Signed)
Patient notified of the CT scheduled at [facility] for 04/29/18 9:45 arrival and referral placed for oncology referral.  Patient is aware that he will be contacted directly by oncology with an appt date and time.  He is asked to report to [facility] radiology by Friday to pick up oral contrast and instructions.

## 2018-04-26 ENCOUNTER — Telehealth: Payer: Self-pay

## 2018-04-26 NOTE — Telephone Encounter (Signed)
I spoke with him about the final path.  He is getting CTs (chest, abd, pelv) on Monday the 30th and has already been referred to medical oncology.

## 2018-04-26 NOTE — Telephone Encounter (Signed)
Thank you :)

## 2018-04-26 NOTE — Telephone Encounter (Signed)
Doc of the Day Dr Fuller Plan patient Dr Lyndon Code calls with verbal report from the [place] junction ulceration biopsied at the 04/22/18 upper endoscopy. 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 He will forward a hard copy of the report today. Patient is scheduled for CT abd/pelvis 04/29/18. Oncology referral was placed 04/23/18.

## 2018-04-29 ENCOUNTER — Ambulatory Visit (HOSPITAL_COMMUNITY)
Admission: RE | Admit: 2018-04-29 | Discharge: 2018-04-29 | Disposition: A | Discharge status: Home / Self Care | Payer: Medicare Other | Source: Ambulatory Visit | Attending: Gastroenterology | Admitting: Gastroenterology

## 2018-04-29 DIAGNOSIS — D490 Neoplasm of unspecified behavior of digestive system: Secondary | ICD-10-CM

## 2018-04-29 DIAGNOSIS — R1013 Epigastric pain: Secondary | ICD-10-CM | POA: Diagnosis present

## 2018-04-29 DIAGNOSIS — R142 Eructation: Secondary | ICD-10-CM

## 2018-04-29 MED ORDER — IOHEXOL 300 MG/ML  SOLN
100.0000 mL | Freq: Once | INTRAMUSCULAR | Status: AC | PRN
  Administered 2018-04-29: 100 mL via INTRAVENOUS

## 2018-04-29 MED ORDER — SODIUM CHLORIDE (PF) 0.9 % IJ SOLN
INTRAMUSCULAR | Status: AC
  Filled 2018-04-29: qty 50

## 2018-04-30 ENCOUNTER — Telehealth: Payer: Self-pay | Admitting: Hematology

## 2018-04-30 ENCOUNTER — Encounter: Payer: Self-pay | Admitting: Hematology

## 2018-04-30 NOTE — Telephone Encounter (Signed)
Received a new referral from Dr. Fuller Plan for gastric tumor. Pt has been scheduled to see Dr. Maylon Peppers on 1/8 at [place]. Unable to reach the pt by phone. Lft the appt information on the pt's vm. Letter mailed.

## 2018-05-04 NOTE — Progress Notes (Addendum)
[place] NOTE  Patient Care Team: Eulas Post, Tristan Hughes as PCP - General  HEME/ONC OVERVIEW: 1. 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, stage TBD -03/2018: presented to GI for evaluation of persistent epigastric pain; EGD showed an infiltrating and ulcerated, non-circumferential, friable mass involving the lesser curvature of the stomach and the cardia measuring approximately 4 x 2 cm, one superficial distal esophageal ulcer thought to be in the extension of the ulcerated gastric mass; no stigmata of bleeding; biopsy of GEJ ulcer showed poorly differentiated adenocarcinoma, favoring gastric primary; CT CAP showed known GEJ/gastric cardia adenocarcinoma w/ small adjacent LN's, concerning for nodal mets, no distant mets identified   ASSESSMENT & PLAN:   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, stage TBD -I reviewed the patient records in detail, include GI clinic notes and EGD report -I also independently reviewed the radiologic images of recent CT CAP, and agree with findings documented -In summary, patient presented to GI in 03/2018 for persistent epigastric pain, and EGD showed an ulcerated mass involving the lesser curvature of the stomach and the cardia with the extension to GEJ; biopsy of the GEJ ulcer showed poorly differentiated adenocarcinoma, favoring gastric primary; CT CAP showed small adjacent gastric lymph nodes, concerning for nodal metastases; there was no distant metastases -I have sent a message to GI regarding completing staging EGD/EUS  -Given the questionable LN involvement, I have ordered PET scan  -In anticipation of chemotherapy, I have also ordered port  -We will discuss the case at the tumor board next week -Pending the work-up above, we will determine the treatment options  -If no metastatic disease, then he will also need surgery referral in anticipation of surgical resection   Abdominal pain -Patient is  currently taking tramadol PRN (~three times a day) -I have refilled the tramadol prescription and E-scribed to his pharmacy   Health maintenance -I encouraged the patient and his immediate family members to be up-to-date with his vaccinations -Influenza vaccine administered in clinic today   Orders Placed This Encounter  Procedures  . NM PET Image Initial (PI) Skull Base To Thigh    Standing Status:   Future    Standing Expiration Date:   05/08/2019    Order Specific Question:   If indicated for the ordered procedure, I authorize the administration of a radiopharmaceutical per Radiology protocol    Answer:   Yes    Order Specific Question:   Preferred imaging location?    Answer:   [facility]    Order Specific Question:   Radiology Contrast Protocol - do NOT remove file path    Answer:   [email or web address]  . IR IMAGING GUIDED PORT INSERTION    Standing Status:   Future    Standing Expiration Date:   07/07/2019    Order Specific Question:   Reason for Exam (SYMPTOM  OR DIAGNOSIS REQUIRED)    Answer:   Gastric cancer, anticipating chemotherapy    Order Specific Question:   Preferred Imaging Location?    Answer:   [facility]    A total of more than 40 minutes were spent face-to-face with the patient during this encounter and over half of that time was spent on counseling and coordination of care as outlined above.    All questions were answered. The patient knows to call the clinic with any problems, questions or concerns.  Return in 2 weeks for labs and clinic follow-up.   Tristan Men, Tristan Hughes  05/08/2018 10:08 AM   CHIEF COMPLAINTS/PURPOSE OF CONSULTATION:  "I am still having some abdominal pain*"  HISTORY OF PRESENTING ILLNESS:  Tristan Hughes 78 y.o. male is here because of newly diagnosed gastric adenocarcinoma.  Patient reports that he first developed upper abdominal pain in early 01/2018, thought to be due to ulcer disease.  The pain is  intermittent, localized to the upper abdomen, alternating quality between sharp/dull/achy, fluctuating intensity, for which he takes tramadol approximately 3 times a day with adequate pain control.  He was initially treated with supportive care without improvement, and there was subsequently referred to gastroenterology, who performed EGD that showed ulcerated mass involving the lesser curvature of the stomach and the cardia with extension to GEJ.  Unfortunately, the biopsy showed adenocarcinoma, suggestive of gastric primary.  Patient reports decreased appetite and approximately 6 pound weight loss over the past 2 weeks, but denies any fever, chill, night sweats, chest pain, dyspnea, dysphagia, odynophagia, nausea, vomiting, diarrhea, or abnormal bleeding/bruising.   I have reviewed his chart and materials related to his cancer extensively and collaborated history with the patient. Summary of oncologic history is as follows:   Gastric adenocarcinoma ([place])   04/22/2018 Procedure    EGD: - An infiltrative and ulcerated, non-circumferential, friable mass with no bleeding and no stigmata of recent bleeding involving the lesser curvature of the stomach and the cardia measuring approximately 4 cm x 2 cm. Biopsies were taken with a cold forceps for histology. Findings: - The exam of the stomach was otherwise normal. - One superficial distal esophageal ulcer which was an extention of the ulcerated gastric mass with no bleeding and no stigmata of recent bleeding was found 41 cm from the incisors at the EGJ . - The exam of the esophagus was otherwise normal. - A small non-bleeding diverticulum was found in the second portion of the duodenum. - The exam of the duodenum was otherwise normal.    04/22/2018 Pathology Results    Accession: [id number]  Diagnosis Surgical [P], GE junction-ulcerated lesion - POORLY DIFFERENTIATED ADENOCARCINOMA. - SEE COMMENT. Microscopic Comment The features slightly  favor a gastric primary rather than an esophageal primary    04/29/2018 Imaging    CT CAP: IMPRESSION: 1. Irregular wall thickening of the proximal stomach extending from the gastroesophageal junction into the gastric cardia suspicious for infiltrative neoplasm given provided clinical history. Adjacent prominent lymph nodes, possibly small nodal metastases. No distant metastases identified. 2. No suspicious hepatic findings. 3. Posttraumatic right rib findings with probable incidental lesion involving the posterior aspect of the right 4th rib. No suspicious pulmonary nodules. 4. Evidence of prior granulomatous disease with calcified hilar and upper abdominal lymph nodes and calcified splenic granulomas. 5.  Aortic Atherosclerosis (ICD10-I70.0).     MEDICAL HISTORY:  Past Medical History:  Diagnosis Date  . CAD 07/09/2009   Cath 12-02-10 90% left main, 90%LAD, 99% circumflex, 80% RCA  . Cataract    basal cell CA- left ear  . COLONIC POLYPS, HX OF 07/09/2009   Qualifier: Diagnosis of  By: Valma Cava LPN, [name]    . DIAB W/O COMP TYPE II/UNS NOT STATED UNCNTRL 07/09/2009   Qualifier: Diagnosis of  By: Joyce Gross    . GERD (gastroesophageal reflux disease)   . History of chicken pox   . HYPERLIPIDEMIA 07/09/2009   Qualifier: Diagnosis of  By: Valma Cava LPN, [name]    . HYPERTENSION 07/09/2009   Qualifier: Diagnosis of  By: Valma Cava LPN, [name]    . OBSTRUCTIVE SLEEP APNEA  07/09/2009   Qualifier: Diagnosis of  By: Joyce Gross    . Sleep apnea    no CPAP    SURGICAL HISTORY: Past Surgical History:  Procedure Laterality Date  . BIOPSY BREAST    . CHOLECYSTECTOMY    . COLONOSCOPY    . CORONARY ANGIOPLASTY WITH STENT PLACEMENT    . CORONARY ARTERY BYPASS GRAFT  8/12   CABG X 4 LIMA-LAD, SVG-PDB, SVG-OM/RAMUS   /  . TONSILLECTOMY      SOCIAL HISTORY: Social History   Socioeconomic History  . Marital status: Married    Spouse name: Not on file  . Number of children: Not on  file  . Years of education: Not on file  . Highest education level: Not on file  Occupational History  . Not on file  Social Needs  . Financial resource strain: Not on file  . Food insecurity:    Worry: Not on file    Inability: Not on file  . Transportation needs:    Medical: Not on file    Non-medical: Not on file  Tobacco Use  . Smoking status: Never Smoker  . Smokeless tobacco: Never Used  Substance and Sexual Activity  . Alcohol use: No  . Drug use: No  . Sexual activity: Not on file  Lifestyle  . Physical activity:    Days per week: Not on file    Minutes per session: Not on file  . Stress: Not on file  Relationships  . Social connections:    Talks on phone: Not on file    Gets together: Not on file    Attends religious service: Not on file    Active member of club or organization: Not on file    Attends meetings of clubs or organizations: Not on file    Relationship status: Not on file  . Intimate partner violence:    Fear of current or ex partner: Not on file    Emotionally abused: Not on file    Physically abused: Not on file    Forced sexual activity: Not on file  Other Topics Concern  . Not on file  Social History Narrative  . Not on file    FAMILY HISTORY: Family History  Problem Relation Age of Onset  . Hypertension Mother   . Heart disease Mother   . Stroke Mother   . Diabetes Mother   . Cancer Mother        pancreatic  . Pancreatic cancer Mother   . Heart disease Father   . Hypertension Father   . Colon cancer Maternal Uncle   . Esophageal cancer Neg Hx   . Stomach cancer Neg Hx   . Rectal cancer Neg Hx     ALLERGIES:  is allergic to naproxen sodium.  MEDICATIONS:  Current Outpatient Medications  Medication Sig Dispense Refill  . amLODipine (NORVASC) 5 MG tablet TAKE 1 TABLET DAILY 90 tablet 1  . Ascorbic Acid (VITAMIN C) 100 MG tablet Take 100 mg by mouth daily.    Marland Kitchen aspirin 81 MG tablet Take 81 mg by mouth daily.    . calcium  carbonate (TUMS - DOSED IN MG ELEMENTAL CALCIUM) 500 MG chewable tablet Chew 1 tablet by mouth daily.    . Cholecalciferol (VITAMIN D) 2000 UNITS CAPS Take 5,000 Units by mouth daily.     Marland Kitchen ezetimibe (ZETIA) 10 MG tablet TAKE 1 TABLET DAILY 90 tablet 2  . furosemide (LASIX) 20 MG tablet TAKE 1 TABLET TWICE  A DAY 180 tablet 3  . Horse Chestnut 300 MG CAPS Take 300 mg by mouth 2 (two) times daily.    Marland Kitchen HUMALOG 100 UNIT/ML injection INJECT 15 TO 18 UNITS UNDER THE SKIN THREE TIMES A DAY BEFORE MEALS 50 mL 2  . Insulin Syringe-Needle U-100 (B-D INS SYR HALF-UNIT .3CC/31G) 31G X 5/16" 0.3 ML MISC by Does not apply route.    . Insulin Syringe-Needle U-100 31G X 5/16" 1 ML MISC Use once daily 100 each 3  . lisinopril (PRINIVIL,ZESTRIL) 20 MG tablet Take 10 mg by mouth daily.    . metFORMIN (GLUCOPHAGE) 1000 MG tablet TAKE 1 TABLET TWICE A DAY WITH MEALS 180 tablet 4  . metoprolol tartrate (LOPRESSOR) 50 MG tablet TAKE ONE AND ONE-HALF TABLETS TWICE A DAY (SCHEDULE A PHYSICAL FOR MORE REFILLS) 270 tablet 4  . rosuvastatin (CRESTOR) 20 MG tablet TAKE 1 TABLET DAILY (PLEASE SCHEDULE A PHYSICAL FOR MORE REFILLS) 90 tablet 3  . traMADol (ULTRAM) 50 MG tablet Take 1 tablet (50 mg total) by mouth 3 (three) times daily as needed for moderate pain. 90 tablet 3  . vitamin E (VITAMIN E) 400 UNIT capsule Takes one tablet by mouth daily. (Patient taking differently: Take 400 Units by mouth daily. Takes one tablet by mouth daily.)    . LANTUS 100 UNIT/ML injection INJECT 46 UNITS UNDER THE SKIN AT BEDTIME (Patient not taking: Reported on 05/08/2018) 50 mL 5   No current facility-administered medications for this visit.     REVIEW OF SYSTEMS:   Constitutional: ( - ) fevers, ( - )  chills , ( - ) night sweats Eyes: ( - ) blurriness of vision, ( - ) double vision, ( - ) watery eyes Ears, nose, mouth, throat, and face: ( - ) mucositis, ( - ) sore throat Respiratory: ( - ) cough, ( - ) dyspnea, ( - )  wheezes Cardiovascular: ( - ) palpitation, ( - ) chest discomfort, ( - ) lower extremity swelling Gastrointestinal:  ( - ) nausea, ( - ) heartburn, ( - ) change in bowel habits Skin: ( - ) abnormal skin rashes Lymphatics: ( - ) new lymphadenopathy, ( - ) easy bruising Neurological: ( - ) numbness, ( - ) tingling, ( - ) new weaknesses Behavioral/Psych: ( - ) mood change, ( - ) new changes  All other systems were reviewed with the patient and are negative.  PHYSICAL EXAMINATION: ECOG PERFORMANCE STATUS: 0 - Asymptomatic  Vitals:   05/08/18 0849  BP: 111/73  Pulse: (!) 55  Resp: 18  Temp: 97.7 F (36.5 C)  SpO2: 100%   Filed Weights   05/08/18 0849  Weight: 215 lb 9.6 oz (97.8 kg)    GENERAL: alert, no distress and comfortable SKIN: skin color, texture, turgor are normal, no rashes or significant lesions EYES: conjunctiva are pink and non-injected, sclera clear OROPHARYNX: no exudate, no erythema; lips, buccal mucosa, and tongue normal  NECK: supple, non-tender LYMPH:  no palpable lymphadenopathy in the cervical or axillary LUNGS: clear to auscultation and percussion with normal breathing effort HEART: regular rate & rhythm, no murmurs, no lower extremity edema ABDOMEN: soft, non-tender, non-distended, normal bowel sounds Musculoskeletal: no cyanosis of digits and no clubbing  PSYCH: alert & oriented x 3, fluent speech NEURO: no focal motor/sensory deficits  LABORATORY DATA:  I have reviewed the data as listed Lab Results  Component Value Date   WBC 6.9 04/22/2018   HGB 15.9 04/22/2018   HCT 46.3  04/22/2018   MCV 88.1 04/22/2018   PLT 175.0 04/22/2018   Lab Results  Component Value Date   NA 140 04/22/2018   K 4.4 04/22/2018   CL 101 04/22/2018   CO2 34 (H) 04/22/2018    RADIOGRAPHIC STUDIES: I have personally reviewed the radiological images as listed and agreed with the findings in the report. Ct Chest W Contrast  Result Date: 04/29/2018 CLINICAL DATA:   Malignant gastric ulcer diagnosed 2 months ago. Epigastric pain. EXAM: CT CHEST, ABDOMEN, AND PELVIS WITH CONTRAST TECHNIQUE: Multidetector CT imaging of the chest, abdomen and pelvis was performed following the standard protocol during bolus administration of intravenous contrast. CONTRAST:  150mL OMNIPAQUE IOHEXOL 300 MG/ML  SOLN COMPARISON:  Limited correlation made with chest radiographs 12/15/2016. FINDINGS: CT CHEST FINDINGS Cardiovascular: Diffuse atherosclerosis of the aorta, great vessels and coronary arteries status post median sternotomy and CABG. The heart size is normal. There is no pericardial effusion. Mediastinum/Nodes: There are calcified hilar lymph nodes bilaterally. No enlarged mediastinal, hilar or axillary lymph nodes are identified. There is mild nodularity of the thyroid gland without dominant component. The trachea and esophagus demonstrate no significant findings. Lungs/Pleura: No pleural effusion or pneumothorax. There is mild calcified pleural thickening on the left. 12 x 6 mm subpleural nodule posteriorly in the right upper lobe appears contiguous with the right 4th rib and partially ossified/calcified, probably a rib lesion. There is minimal perifissural nodularity along the left major fissure. No suspicious pulmonary nodules. There is mild subpleural reticulation in both lung bases. Musculoskeletal/Chest wall: In addition to the right 4th rib lesion described above, there are old fractures of the right 7th and 8th ribs laterally with incomplete healing. No destructive osseous lesions. CT ABDOMEN AND PELVIS FINDINGS Hepatobiliary: The liver is normal in density without suspicious focal abnormality. Mild extrahepatic biliary dilatation, within physiologic limits post cholecystectomy. Pancreas: Unremarkable. No pancreatic ductal dilatation or surrounding inflammatory changes. Spleen: Multiple calcified granulomas. No other focal abnormality or splenomegaly. Adrenals/Urinary Tract: Both  adrenal glands appear normal. The kidneys and ureters appear normal. The bladder is mildly trabeculated without focal lesion. Stomach/Bowel: There is prominent wall thickening of the proximal stomach extending from the gastroesophageal junction into the gastric cardia. No well-defined mass or ulceration identified in this region. There is adjacent soft tissue stranding extending into the gastrohepatic ligament. No bowel wall thickening, significant distention or surrounding inflammation. There is a small duodenal diverticulum. Vascular/Lymphatic: As above, there is soft tissue stranding medial to the gastric cardia in the gastrohepatic ligament with a probable 10 mm node on image 58/2. There is an 11 mm node superior to the pancreatic head on image 62/2. There are small mesenteric and external iliac nodes bilaterally. There is a calcified lymph node in the portacaval space. Moderate diffuse aortic and branch vessel atherosclerosis without acute vascular findings. Reproductive: Mild enlargement of the prostate gland without focal abnormality. Other: Small umbilical hernia containing only fat. No ascites or peritoneal nodularity. Musculoskeletal: No acute or significant osseous findings. Lower lumbar spondylosis. IMPRESSION: 1. Irregular wall thickening of the proximal stomach extending from the gastroesophageal junction into the gastric cardia suspicious for infiltrative neoplasm given provided clinical history. Adjacent prominent lymph nodes, possibly small nodal metastases. No distant metastases identified. 2. No suspicious hepatic findings. 3. Posttraumatic right rib findings with probable incidental lesion involving the posterior aspect of the right 4th rib. No suspicious pulmonary nodules. 4. Evidence of prior granulomatous disease with calcified hilar and upper abdominal lymph nodes and calcified splenic granulomas.  5.  Aortic Atherosclerosis (ICD10-I70.0). Electronically Signed   By: Richardean Sale M.D.   On:  04/29/2018 14:21   Ct Abdomen Pelvis W Contrast  Result Date: 04/29/2018 CLINICAL DATA:  Malignant gastric ulcer diagnosed 2 months ago. Epigastric pain. EXAM: CT CHEST, ABDOMEN, AND PELVIS WITH CONTRAST TECHNIQUE: Multidetector CT imaging of the chest, abdomen and pelvis was performed following the standard protocol during bolus administration of intravenous contrast. CONTRAST:  142mL OMNIPAQUE IOHEXOL 300 MG/ML  SOLN COMPARISON:  Limited correlation made with chest radiographs 12/15/2016. FINDINGS: CT CHEST FINDINGS Cardiovascular: Diffuse atherosclerosis of the aorta, great vessels and coronary arteries status post median sternotomy and CABG. The heart size is normal. There is no pericardial effusion. Mediastinum/Nodes: There are calcified hilar lymph nodes bilaterally. No enlarged mediastinal, hilar or axillary lymph nodes are identified. There is mild nodularity of the thyroid gland without dominant component. The trachea and esophagus demonstrate no significant findings. Lungs/Pleura: No pleural effusion or pneumothorax. There is mild calcified pleural thickening on the left. 12 x 6 mm subpleural nodule posteriorly in the right upper lobe appears contiguous with the right 4th rib and partially ossified/calcified, probably a rib lesion. There is minimal perifissural nodularity along the left major fissure. No suspicious pulmonary nodules. There is mild subpleural reticulation in both lung bases. Musculoskeletal/Chest wall: In addition to the right 4th rib lesion described above, there are old fractures of the right 7th and 8th ribs laterally with incomplete healing. No destructive osseous lesions. CT ABDOMEN AND PELVIS FINDINGS Hepatobiliary: The liver is normal in density without suspicious focal abnormality. Mild extrahepatic biliary dilatation, within physiologic limits post cholecystectomy. Pancreas: Unremarkable. No pancreatic ductal dilatation or surrounding inflammatory changes. Spleen: Multiple  calcified granulomas. No other focal abnormality or splenomegaly. Adrenals/Urinary Tract: Both adrenal glands appear normal. The kidneys and ureters appear normal. The bladder is mildly trabeculated without focal lesion. Stomach/Bowel: There is prominent wall thickening of the proximal stomach extending from the gastroesophageal junction into the gastric cardia. No well-defined mass or ulceration identified in this region. There is adjacent soft tissue stranding extending into the gastrohepatic ligament. No bowel wall thickening, significant distention or surrounding inflammation. There is a small duodenal diverticulum. Vascular/Lymphatic: As above, there is soft tissue stranding medial to the gastric cardia in the gastrohepatic ligament with a probable 10 mm node on image 58/2. There is an 11 mm node superior to the pancreatic head on image 62/2. There are small mesenteric and external iliac nodes bilaterally. There is a calcified lymph node in the portacaval space. Moderate diffuse aortic and branch vessel atherosclerosis without acute vascular findings. Reproductive: Mild enlargement of the prostate gland without focal abnormality. Other: Small umbilical hernia containing only fat. No ascites or peritoneal nodularity. Musculoskeletal: No acute or significant osseous findings. Lower lumbar spondylosis. IMPRESSION: 1. Irregular wall thickening of the proximal stomach extending from the gastroesophageal junction into the gastric cardia suspicious for infiltrative neoplasm given provided clinical history. Adjacent prominent lymph nodes, possibly small nodal metastases. No distant metastases identified. 2. No suspicious hepatic findings. 3. Posttraumatic right rib findings with probable incidental lesion involving the posterior aspect of the right 4th rib. No suspicious pulmonary nodules. 4. Evidence of prior granulomatous disease with calcified hilar and upper abdominal lymph nodes and calcified splenic granulomas.  5.  Aortic Atherosclerosis (ICD10-I70.0). Electronically Signed   By: Richardean Sale M.D.   On: 04/29/2018 14:21    PATHOLOGY: I have reviewed the pathology reports as documented in the oncologist history.

## 2018-05-08 ENCOUNTER — Inpatient Hospital Stay: Payer: Medicare Other | Attending: Hematology | Admitting: Hematology

## 2018-05-08 ENCOUNTER — Encounter: Payer: Self-pay | Admitting: Hematology

## 2018-05-08 VITALS — BP 111/73 | HR 55 | Temp 97.7°F | Resp 18 | Ht 69.0 in | Wt 215.6 lb

## 2018-05-08 DIAGNOSIS — E119 Type 2 diabetes mellitus without complications: Secondary | ICD-10-CM

## 2018-05-08 DIAGNOSIS — R110 Nausea: Secondary | ICD-10-CM | POA: Diagnosis not present

## 2018-05-08 DIAGNOSIS — R1013 Epigastric pain: Secondary | ICD-10-CM | POA: Diagnosis not present

## 2018-05-08 DIAGNOSIS — I700 Atherosclerosis of aorta: Secondary | ICD-10-CM | POA: Diagnosis not present

## 2018-05-08 DIAGNOSIS — Z7982 Long term (current) use of aspirin: Secondary | ICD-10-CM | POA: Insufficient documentation

## 2018-05-08 DIAGNOSIS — Z800 Family history of malignant neoplasm of digestive organs: Secondary | ICD-10-CM | POA: Diagnosis not present

## 2018-05-08 DIAGNOSIS — E785 Hyperlipidemia, unspecified: Secondary | ICD-10-CM | POA: Diagnosis not present

## 2018-05-08 DIAGNOSIS — I2510 Atherosclerotic heart disease of native coronary artery without angina pectoris: Secondary | ICD-10-CM | POA: Insufficient documentation

## 2018-05-08 DIAGNOSIS — R634 Abnormal weight loss: Secondary | ICD-10-CM

## 2018-05-08 DIAGNOSIS — Z79899 Other long term (current) drug therapy: Secondary | ICD-10-CM | POA: Diagnosis not present

## 2018-05-08 DIAGNOSIS — Z23 Encounter for immunization: Secondary | ICD-10-CM | POA: Insufficient documentation

## 2018-05-08 DIAGNOSIS — C169 Malignant neoplasm of stomach, unspecified: Secondary | ICD-10-CM

## 2018-05-08 DIAGNOSIS — Z85828 Personal history of other malignant neoplasm of skin: Secondary | ICD-10-CM

## 2018-05-08 DIAGNOSIS — C165 Malignant neoplasm of lesser curvature of stomach, unspecified: Secondary | ICD-10-CM | POA: Insufficient documentation

## 2018-05-08 DIAGNOSIS — R630 Anorexia: Secondary | ICD-10-CM | POA: Diagnosis not present

## 2018-05-08 DIAGNOSIS — I10 Essential (primary) hypertension: Secondary | ICD-10-CM | POA: Diagnosis not present

## 2018-05-08 DIAGNOSIS — Z5111 Encounter for antineoplastic chemotherapy: Secondary | ICD-10-CM | POA: Diagnosis not present

## 2018-05-08 DIAGNOSIS — K219 Gastro-esophageal reflux disease without esophagitis: Secondary | ICD-10-CM | POA: Insufficient documentation

## 2018-05-08 DIAGNOSIS — Z794 Long term (current) use of insulin: Secondary | ICD-10-CM | POA: Diagnosis not present

## 2018-05-08 DIAGNOSIS — R109 Unspecified abdominal pain: Secondary | ICD-10-CM | POA: Insufficient documentation

## 2018-05-08 MED ORDER — TRAMADOL HCL 50 MG PO TABS: 50.0000 mg | ORAL_TABLET | Freq: Three times a day (TID) | ORAL | 3 refills | Status: DC | PRN

## 2018-05-08 MED ORDER — TRAMADOL HCL 50 MG PO TABS: 50.0000 mg | ORAL_TABLET | Freq: Three times a day (TID) | ORAL | 3 refills | Status: AC | PRN

## 2018-05-08 MED ORDER — INFLUENZA VAC SPLIT QUAD 0.5 ML IM SUSY
0.5000 mL | PREFILLED_SYRINGE | Freq: Once | INTRAMUSCULAR | Status: AC
  Administered 2018-05-08: 0.5 mL via INTRAMUSCULAR

## 2018-05-13 ENCOUNTER — Telehealth: Payer: Self-pay

## 2018-05-13 ENCOUNTER — Other Ambulatory Visit: Payer: Self-pay | Admitting: Hematology

## 2018-05-13 DIAGNOSIS — R110 Nausea: Secondary | ICD-10-CM

## 2018-05-13 DIAGNOSIS — C169 Malignant neoplasm of stomach, unspecified: Secondary | ICD-10-CM

## 2018-05-13 MED ORDER — ONDANSETRON HCL 8 MG PO TABS: 8.0000 mg | ORAL_TABLET | Freq: Three times a day (TID) | ORAL | 2 refills | Status: AC | PRN

## 2018-05-13 NOTE — Telephone Encounter (Signed)
  Oncology Nurse Navigator Documentation     Called to advise patient's wife that Zofran prescription had been sent to their pharmacy. I reviewed how to take medications and also gave instructions to use stool softener or Mirilax for constipation. Also advised patient that patient will have PET scan on 05/21/18 and if additional information needed then Dr. Maylon Peppers would revisit referral to GI for additional testing. Encouraged to call with questions or concerns. They have my contact information.

## 2018-05-14 ENCOUNTER — Other Ambulatory Visit: Payer: Self-pay | Admitting: Radiology

## 2018-05-15 ENCOUNTER — Ambulatory Visit (HOSPITAL_COMMUNITY)
Admission: RE | Admit: 2018-05-15 | Discharge: 2018-05-15 | Disposition: A | Discharge status: Home / Self Care | Payer: Medicare Other | Source: Ambulatory Visit | Attending: Hematology | Admitting: Hematology

## 2018-05-15 ENCOUNTER — Encounter (HOSPITAL_COMMUNITY): Payer: Self-pay | Admitting: Diagnostic Radiology

## 2018-05-15 DIAGNOSIS — I10 Essential (primary) hypertension: Secondary | ICD-10-CM | POA: Diagnosis not present

## 2018-05-15 DIAGNOSIS — E785 Hyperlipidemia, unspecified: Secondary | ICD-10-CM | POA: Diagnosis not present

## 2018-05-15 DIAGNOSIS — Z7982 Long term (current) use of aspirin: Secondary | ICD-10-CM | POA: Diagnosis not present

## 2018-05-15 DIAGNOSIS — G4733 Obstructive sleep apnea (adult) (pediatric): Secondary | ICD-10-CM | POA: Insufficient documentation

## 2018-05-15 DIAGNOSIS — I2510 Atherosclerotic heart disease of native coronary artery without angina pectoris: Secondary | ICD-10-CM | POA: Insufficient documentation

## 2018-05-15 DIAGNOSIS — Z794 Long term (current) use of insulin: Secondary | ICD-10-CM | POA: Diagnosis not present

## 2018-05-15 DIAGNOSIS — C169 Malignant neoplasm of stomach, unspecified: Secondary | ICD-10-CM

## 2018-05-15 DIAGNOSIS — Z79899 Other long term (current) drug therapy: Secondary | ICD-10-CM | POA: Diagnosis not present

## 2018-05-15 DIAGNOSIS — K219 Gastro-esophageal reflux disease without esophagitis: Secondary | ICD-10-CM | POA: Diagnosis not present

## 2018-05-15 DIAGNOSIS — E119 Type 2 diabetes mellitus without complications: Secondary | ICD-10-CM | POA: Insufficient documentation

## 2018-05-15 HISTORY — PX: IR IMAGING GUIDED PORT INSERTION: IMG5740

## 2018-05-15 LAB — CBC WITH DIFFERENTIAL/PLATELET
Abs Immature Granulocytes: 0.03 10*3/uL (ref 0.00–0.07)
Basophils Absolute: 0 10*3/uL (ref 0.0–0.1)
Basophils Relative: 0 %
Eosinophils Absolute: 0.1 10*3/uL (ref 0.0–0.5)
Eosinophils Relative: 1 %
HCT: 50.1 % (ref 39.0–52.0)
Hemoglobin: 16.1 g/dL (ref 13.0–17.0)
Immature Granulocytes: 0 %
Lymphocytes Relative: 15 %
Lymphs Abs: 1.4 10*3/uL (ref 0.7–4.0)
MCH: 29.1 pg (ref 26.0–34.0)
MCHC: 32.1 g/dL (ref 30.0–36.0)
MCV: 90.6 fL (ref 80.0–100.0)
Monocytes Absolute: 0.7 10*3/uL (ref 0.1–1.0)
Monocytes Relative: 8 %
Neutro Abs: 6.9 10*3/uL (ref 1.7–7.7)
Neutrophils Relative %: 76 %
Platelets: 244 10*3/uL (ref 150–400)
RBC: 5.53 MIL/uL (ref 4.22–5.81)
RDW: 12.4 % (ref 11.5–15.5)
WBC: 9.2 10*3/uL (ref 4.0–10.5)
nRBC: 0 % (ref 0.0–0.2)

## 2018-05-15 LAB — PROTIME-INR
INR: 0.9
Prothrombin Time: 12.1 seconds (ref 11.4–15.2)

## 2018-05-15 LAB — GLUCOSE, CAPILLARY: Glucose-Capillary: 121 mg/dL — ABNORMAL HIGH (ref 70–99)

## 2018-05-15 MED ORDER — FENTANYL CITRATE (PF) 100 MCG/2ML IJ SOLN
INTRAMUSCULAR | Status: AC
  Filled 2018-05-15: qty 2

## 2018-05-15 MED ORDER — MIDAZOLAM HCL 2 MG/2ML IJ SOLN
INTRAMUSCULAR | Status: AC | PRN
  Administered 2018-05-15 (×3): 1 mg via INTRAVENOUS

## 2018-05-15 MED ORDER — MIDAZOLAM HCL 2 MG/2ML IJ SOLN
INTRAMUSCULAR | Status: AC
  Filled 2018-05-15: qty 2

## 2018-05-15 MED ORDER — HEPARIN SOD (PORK) LOCK FLUSH 100 UNIT/ML IV SOLN
INTRAVENOUS | Status: AC | PRN
  Administered 2018-05-15: 500 [IU] via INTRAVENOUS

## 2018-05-15 MED ORDER — HEPARIN SOD (PORK) LOCK FLUSH 100 UNIT/ML IV SOLN
INTRAVENOUS | Status: AC
  Filled 2018-05-15: qty 5

## 2018-05-15 MED ORDER — CEFAZOLIN SODIUM-DEXTROSE 2-4 GM/100ML-% IV SOLN
INTRAVENOUS | Status: AC
  Administered 2018-05-15: 2 g via INTRAVENOUS
  Filled 2018-05-15: qty 100

## 2018-05-15 MED ORDER — LIDOCAINE HCL 1 % IJ SOLN
INTRAMUSCULAR | Status: AC
  Filled 2018-05-15: qty 20

## 2018-05-15 MED ORDER — FENTANYL CITRATE (PF) 100 MCG/2ML IJ SOLN
INTRAMUSCULAR | Status: AC | PRN
  Administered 2018-05-15 (×3): 50 ug via INTRAVENOUS

## 2018-05-15 MED ORDER — LIDOCAINE-EPINEPHRINE (PF) 1 %-1:200000 IJ SOLN
INTRAMUSCULAR | Status: AC | PRN
  Administered 2018-05-15: 10 mL

## 2018-05-15 MED ORDER — LIDOCAINE-EPINEPHRINE (PF) 2 %-1:200000 IJ SOLN
INTRAMUSCULAR | Status: AC
  Filled 2018-05-15: qty 20

## 2018-05-15 MED ORDER — CEFAZOLIN SODIUM-DEXTROSE 2-4 GM/100ML-% IV SOLN
2.0000 g | INTRAVENOUS | Status: AC
  Administered 2018-05-15: 2 g via INTRAVENOUS

## 2018-05-15 MED ORDER — SODIUM CHLORIDE 0.9 % IV SOLN
INTRAVENOUS | Status: DC
  Administered 2018-05-15: 13:00:00 via INTRAVENOUS

## 2018-05-15 MED ORDER — LIDOCAINE HCL (PF) 1 % IJ SOLN
INTRAMUSCULAR | Status: AC | PRN
  Administered 2018-05-15: 10 mL via SUBCUTANEOUS

## 2018-05-15 NOTE — Discharge Instructions (Addendum)
You may remove dressing and bathe in 24 hours. Please do not use any EMLA cream or lotions over your port site until all glue and steri-strips have come off on their own.    Moderate Conscious Sedation, Adult, Care After These instructions provide you with information about caring for yourself after your procedure. Your health care provider may also give you more specific instructions. Your treatment has been planned according to current medical practices, but problems sometimes occur. Call your health care provider if you have any problems or questions after your procedure. What can I expect after the procedure? After your procedure, it is common:  To feel sleepy for several hours.  To feel clumsy and have poor balance for several hours.  To have poor judgment for several hours.  To vomit if you eat too soon. Follow these instructions at home: For at least 24 hours after the procedure:   Do not: ? Participate in activities where you could fall or become injured. ? Drive. ? Use heavy machinery. ? Drink alcohol. ? Take sleeping pills or medicines that cause drowsiness. ? Make important decisions or sign legal documents. ? Take care of children on your own.  Rest. Eating and drinking  Follow the diet recommended by your health care provider.  If you vomit: ? Drink water, juice, or soup when you can drink without vomiting. ? Make sure you have little or no nausea before eating solid foods. General instructions  Have a responsible adult stay with you until you are awake and alert.  Take over-the-counter and prescription medicines only as told by your health care provider.  If you smoke, do not smoke without supervision.  Keep all follow-up visits as told by your health care provider. This is important. Contact a health care provider if:  You keep feeling nauseous or you keep vomiting.  You feel light-headed.  You develop a rash.  You have a fever. Get help right away  if:  You have trouble breathing. This information is not intended to replace advice given to you by your health care provider. Make sure you discuss any questions you have with your health care provider. Document Released: 02/05/2013 Document Revised: 09/20/2015 Document Reviewed: 08/07/2015 Elsevier Interactive Patient Education  2019 [place] An implanted port is a device that is placed under the skin. It is usually placed in the chest. The device can be used to give IV medicine, to take blood, or for dialysis. You may have an implanted port if:  You need IV medicine that would be irritating to the small veins in your hands or arms.  You need IV medicines, such as antibiotics, for a long period of time.  You need IV nutrition for a long period of time.  You need dialysis. Having a port means that your health care provider will not need to use the veins in your arms for these procedures. You may have fewer limitations when using a port than you would if you used other types of long-term IVs, and you will likely be able to return to normal activities after your incision heals. An implanted port has two main parts:  Reservoir. The reservoir is the part where a needle is inserted to give medicines or draw blood. The reservoir is round. After it is placed, it appears as a small, raised area under your skin.  Catheter. The catheter is a thin, flexible tube that connects the reservoir to a vein. Medicine that is inserted  into the reservoir goes into the catheter and then into the vein. How is my port accessed? To access your port:  A numbing cream may be placed on the skin over the port site.  Your health care provider will put on a mask and sterile gloves.  The skin over your port will be cleaned carefully with a germ-killing soap and allowed to dry.  Your health care provider will gently pinch the port and insert a needle into it.  Your health care  provider will check for a blood return to make sure the port is in the vein and is not clogged.  If your port needs to remain accessed to get medicine continuously (constant infusion), your health care provider will place a clear bandage (dressing) over the needle site. The dressing and needle will need to be changed every week, or as told by your health care provider. What is flushing? Flushing helps keep the port from getting clogged. Follow instructions from your health care provider about how and when to flush the port. Ports are usually flushed with saline solution or a medicine called heparin. The need for flushing will depend on how the port is used:  If the port is only used from time to time to give medicines or draw blood, the port may need to be flushed: ? Before and after medicines have been given. ? Before and after blood has been drawn. ? As part of routine maintenance. Flushing may be recommended every 4-6 weeks.  If a constant infusion is running, the port may not need to be flushed.  Throw away any syringes in a disposal container that is meant for sharp items (sharps container). You can buy a sharps container from a pharmacy, or you can make one by using an empty hard plastic bottle with a cover. How long will my port stay implanted? The port can stay in for as long as your health care provider thinks it is needed. When it is time for the port to come out, a surgery will be done to remove it. The surgery will be similar to the procedure that was done to put the port in. Follow these instructions at home:   Flush your port as told by your health care provider.  If you need an infusion over several days, follow instructions from your health care provider about how to take care of your port site. Make sure you: ? Wash your hands with soap and water before you change your dressing. If soap and water are not available, use alcohol-based hand sanitizer. ? Change your dressing as  told by your health care provider. ? Place any used dressings or infusion bags into a plastic bag. Throw that bag in the trash. ? Keep the dressing that covers the needle clean and dry. Do not get it wet. ? Do not use scissors or sharp objects near the tube. ? Keep the tube clamped, unless it is being used.  Check your port site every day for signs of infection. Check for: ? Redness, swelling, or pain. ? Fluid or blood. ? Pus or a bad smell.  Protect the skin around the port site. ? Avoid wearing bra straps that rub or irritate the site. ? Protect the skin around your port from seat belts. Place a soft pad over your chest if needed.  Bathe or shower as told by your health care provider. The site may get wet as long as you are not actively receiving an infusion.  Return to your normal activities as told by your health care provider. Ask your health care provider what activities are safe for you.  Carry a medical alert card or wear a medical alert bracelet at all times. This will let health care providers know that you have an implanted port in case of an emergency. Get help right away if:  You have redness, swelling, or pain at the port site.  You have fluid or blood coming from your port site.  You have pus or a bad smell coming from the port site.  You have a fever. Summary  Implanted ports are usually placed in the chest for long-term IV access.  Follow instructions from your health care provider about flushing the port and changing bandages (dressings).  Take care of the area around your port by avoiding clothing that puts pressure on the area, and by watching for signs of infection.  Protect the skin around your port from seat belts. Place a soft pad over your chest if needed.  Get help right away if you have a fever or you have redness, swelling, pain, drainage, or a bad smell at the port site. This information is not intended to replace advice given to you by your health  care provider. Make sure you discuss any questions you have with your health care provider. Document Released: 04/17/2005 Document Revised: 05/20/2016 Document Reviewed: 05/20/2016 Elsevier Interactive Patient Education  2019 [place] Insertion, Care After This sheet gives you information about how to care for yourself after your procedure. Your health care provider may also give you more specific instructions. If you have problems or questions, contact your health care provider. What can I expect after the procedure? After the procedure, it is common to have:  Discomfort at the port insertion site.  Bruising on the skin over the port. This should improve over 3-4 days. Follow these instructions at home: [facility] care  After your port is placed, you will get a manufacturer's information card. The card has information about your port. Keep this card with you at all times.  Take care of the port as told by your health care provider. Ask your health care provider if you or a family member can get training for taking care of the port at home. A home health care nurse may also take care of the port.  Make sure to remember what type of port you have. Incision care      Follow instructions from your health care provider about how to take care of your port insertion site. Make sure you: ? Wash your hands with soap and water before and after you change your bandage (dressing). If soap and water are not available, use hand sanitizer. ? Change your dressing as told by your health care provider. ? Leave stitches (sutures), skin glue, or adhesive strips in place. These skin closures may need to stay in place for 2 weeks or longer. If adhesive strip edges start to loosen and curl up, you may trim the loose edges. Do not remove adhesive strips completely unless your health care provider tells you to do that.  Check your port insertion site every day for signs of infection. Check  for: ? Redness, swelling, or pain. ? Fluid or blood. ? Warmth. ? Pus or a bad smell. Activity  Return to your normal activities as told by your health care provider. Ask your health care provider what activities are safe for you.  Do not  lift anything that is heavier than 10 lb (4.5 kg), or the limit that you are told, until your health care provider says that it is safe. General instructions  Take over-the-counter and prescription medicines only as told by your health care provider.  Do not take baths, swim, or use a hot tub until your health care provider approves. Ask your health care provider if you may take showers. You may only be allowed to take sponge baths.  Do not drive for 24 hours if you were given a sedative during your procedure.  Wear a medical alert bracelet in case of an emergency. This will tell any health care providers that you have a port.  Keep all follow-up visits as told by your health care provider. This is important. Contact a health care provider if:  You cannot flush your port with saline as directed, or you cannot draw blood from the port.  You have a fever or chills.  You have redness, swelling, or pain around your port insertion site.  You have fluid or blood coming from your port insertion site.  Your port insertion site feels warm to the touch.  You have pus or a bad smell coming from the port insertion site. Get help right away if:  You have chest pain or shortness of breath.  You have bleeding from your port that you cannot control. Summary  Take care of the port as told by your health care provider. Keep the manufacturer's information card with you at all times.  Change your dressing as told by your health care provider.  Contact a health care provider if you have a fever or chills or if you have redness, swelling, or pain around your port insertion site.  Keep all follow-up visits as told by your health care provider. This  information is not intended to replace advice given to you by your health care provider. Make sure you discuss any questions you have with your health care provider. Document Released: 02/05/2013 Document Revised: 11/13/2017 Document Reviewed: 11/13/2017 Elsevier Interactive Patient Education  [employer].

## 2018-05-15 NOTE — Procedures (Signed)
Interventional Radiology Procedure:   Indications: Gastric cancer   Procedure: Port placement  Findings: Right jugular port, tip at SVC/RA junction  Complications: None     EBL: Minimal  Plan: Port is ready to use.     Jacqui Headen R. Anselm Pancoast, MD  Pager: [phone]

## 2018-05-15 NOTE — Sedation Documentation (Signed)
Patient is resting comfortably with eyes closed in NAD. 

## 2018-05-15 NOTE — H&P (Signed)
Chief Complaint: Patient was seen in consultation today for port placement at the request of [place]  Referring Physician(s): Zhao,Yan  Supervising Physician: Markus Daft  Patient Status: [facility] - Out-pt  History of Present Illness: Tristan Hughes is a 78 y.o. male referred for port placement for recently diagnosed gastric cancer. PMHx, meds, labs, imaging, allergies reviewed. Feels well, no recent fevers, chills, illness. Has been NPO today as directed.   Past Medical History:  Diagnosis Date  . CAD 07/09/2009   Cath 12-02-10 90% left main, 90%LAD, 99% circumflex, 80% RCA  . Cataract    basal cell CA- left ear  . COLONIC POLYPS, HX OF 07/09/2009   Qualifier: Diagnosis of  By: Valma Cava LPN, [name]    . DIAB W/O COMP TYPE II/UNS NOT STATED UNCNTRL 07/09/2009   Qualifier: Diagnosis of  By: Joyce Gross    . GERD (gastroesophageal reflux disease)   . History of chicken pox   . HYPERLIPIDEMIA 07/09/2009   Qualifier: Diagnosis of  By: Valma Cava LPN, [name]    . HYPERTENSION 07/09/2009   Qualifier: Diagnosis of  By: Valma Cava LPN, [name]    . OBSTRUCTIVE SLEEP APNEA 07/09/2009   Qualifier: Diagnosis of  By: Joyce Gross    . Sleep apnea    no CPAP    Past Surgical History:  Procedure Laterality Date  . BIOPSY BREAST    . CHOLECYSTECTOMY    . COLONOSCOPY    . CORONARY ANGIOPLASTY WITH STENT PLACEMENT    . CORONARY ARTERY BYPASS GRAFT  8/12   CABG X 4 LIMA-LAD, SVG-PDB, SVG-OM/RAMUS   /  . TONSILLECTOMY      Allergies: Naproxen sodium  Medications: Prior to Admission medications   Medication Sig Start Date End Date Taking? Authorizing Provider  amLODipine (NORVASC) 5 MG tablet TAKE 1 TABLET DAILY 11/14/17  Yes Burchette, Alinda Sierras, MD  Ascorbic Acid (VITAMIN C) 100 MG tablet Take 100 mg by mouth daily.   Yes [provider]  aspirin 81 MG tablet Take 81 mg by mouth daily.   Yes [provider]  calcium carbonate (TUMS - DOSED IN MG ELEMENTAL CALCIUM) 500  MG chewable tablet Chew 1 tablet by mouth daily.   Yes [provider]  Cholecalciferol (VITAMIN D) 2000 UNITS CAPS Take 5,000 Units by mouth daily.    Yes [provider]  ezetimibe (ZETIA) 10 MG tablet TAKE 1 TABLET DAILY 01/01/18  Yes Burchette, Alinda Sierras, MD  furosemide (LASIX) 20 MG tablet TAKE 1 TABLET TWICE A DAY 01/01/18  Yes Burchette, Alinda Sierras, MD  [place] 300 MG CAPS Take 300 mg by mouth 2 (two) times daily.   Yes [provider]  HUMALOG 100 UNIT/ML injection INJECT 15 TO 18 UNITS UNDER THE SKIN THREE TIMES A DAY BEFORE MEALS 11/08/17  Yes Burchette, Alinda Sierras, MD  LANTUS 100 UNIT/ML injection INJECT 46 UNITS UNDER THE SKIN AT BEDTIME 10/22/17  Yes Burchette, Alinda Sierras, MD  lisinopril (PRINIVIL,ZESTRIL) 20 MG tablet Take 10 mg by mouth daily.   Yes [provider]  metFORMIN (GLUCOPHAGE) 1000 MG tablet TAKE 1 TABLET TWICE A DAY WITH MEALS 01/22/18  Yes Burchette, Alinda Sierras, MD  metoprolol tartrate (LOPRESSOR) 50 MG tablet TAKE ONE AND ONE-HALF TABLETS TWICE A DAY (SCHEDULE A PHYSICAL FOR MORE REFILLS) 01/22/18  Yes Burchette, Alinda Sierras, MD  ondansetron (ZOFRAN) 8 MG tablet Take 1 tablet (8 mg total) by mouth every 8 (eight) hours as needed for up to 14 days for  nausea or vomiting. 05/13/18 05/27/18 Yes [name], MD  rosuvastatin (CRESTOR) 20 MG tablet TAKE 1 TABLET DAILY (PLEASE SCHEDULE A PHYSICAL FOR MORE REFILLS) 01/01/18  Yes Burchette, Alinda Sierras, MD  traMADol (ULTRAM) 50 MG tablet Take 1 tablet (50 mg total) by mouth 3 (three) times daily as needed for moderate pain. 05/08/18 06/07/18 Yes [name], MD  vitamin E (VITAMIN E) 400 UNIT capsule Takes one tablet by mouth daily. Patient taking differently: Take 400 Units by mouth daily. Takes one tablet by mouth daily. 10/25/15  Yes Burchette, Alinda Sierras, MD  Insulin Syringe-Needle U-100 (B-D INS SYR HALF-UNIT .3CC/31G) 31G X 5/16" 0.3 ML MISC by Does not apply route.    [provider]  Insulin Syringe-Needle U-100  31G X 5/16" 1 ML MISC Use once daily 12/19/16   Burchette, Alinda Sierras, MD     Family History  Problem Relation Age of Onset  . Hypertension Mother   . Heart disease Mother   . Stroke Mother   . Diabetes Mother   . Cancer Mother        pancreatic  . Pancreatic cancer Mother   . Heart disease Father   . Hypertension Father   . Colon cancer Maternal Uncle   . Esophageal cancer Neg Hx   . Stomach cancer Neg Hx   . Rectal cancer Neg Hx     Social History   Socioeconomic History  . Marital status: Married    Spouse name: Not on file  . Number of children: Not on file  . Years of education: Not on file  . Highest education level: Not on file  Occupational History  . Not on file  Social Needs  . Financial resource strain: Not on file  . Food insecurity:    Worry: Not on file    Inability: Not on file  . Transportation needs:    Medical: Not on file    Non-medical: Not on file  Tobacco Use  . Smoking status: Never Smoker  . Smokeless tobacco: Never Used  Substance and Sexual Activity  . Alcohol use: No  . Drug use: No  . Sexual activity: Not on file  Lifestyle  . Physical activity:    Days per week: Not on file    Minutes per session: Not on file  . Stress: Not on file  Relationships  . Social connections:    Talks on phone: Not on file    Gets together: Not on file    Attends religious service: Not on file    Active member of club or organization: Not on file    Attends meetings of clubs or organizations: Not on file    Relationship status: Not on file  Other Topics Concern  . Not on file  Social History Narrative  . Not on file    Review of Systems: A 12 point ROS discussed and pertinent positives are indicated in the HPI above.  All other systems are negative.  Review of Systems  Vital Signs: BP 111/73 (BP Location: Right Arm)   Pulse (!) 55   Temp 97.7 F (36.5 C) (Oral)   Resp 18   SpO2 98%   Physical Exam Constitutional:      Appearance: Normal  appearance.  HENT:     Mouth/Throat:     Mouth: Mucous membranes are moist.     Pharynx: Oropharynx is clear.  Cardiovascular:     Rate and Rhythm: Normal rate and regular rhythm.  Heart sounds: Normal heart sounds.  Pulmonary:     Effort: Pulmonary effort is normal. No respiratory distress.     Breath sounds: Normal breath sounds.  Abdominal:     General: Abdomen is flat. There is no distension.     Tenderness: There is no abdominal tenderness.  Skin:    General: Skin is warm and dry.  Neurological:     General: No focal deficit present.     Mental Status: He is alert and oriented to person, place, and time.  Psychiatric:        Mood and Affect: Mood normal.        Judgment: Judgment normal.     Imaging: Ct Chest W Contrast  Result Date: 04/29/2018 CLINICAL DATA:  Malignant gastric ulcer diagnosed 2 months ago. Epigastric pain. EXAM: CT CHEST, ABDOMEN, AND PELVIS WITH CONTRAST TECHNIQUE: Multidetector CT imaging of the chest, abdomen and pelvis was performed following the standard protocol during bolus administration of intravenous contrast. CONTRAST:  142mL OMNIPAQUE IOHEXOL 300 MG/ML  SOLN COMPARISON:  Limited correlation made with chest radiographs 12/15/2016. FINDINGS: CT CHEST FINDINGS Cardiovascular: Diffuse atherosclerosis of the aorta, great vessels and coronary arteries status post median sternotomy and CABG. The heart size is normal. There is no pericardial effusion. Mediastinum/Nodes: There are calcified hilar lymph nodes bilaterally. No enlarged mediastinal, hilar or axillary lymph nodes are identified. There is mild nodularity of the thyroid gland without dominant component. The trachea and esophagus demonstrate no significant findings. Lungs/Pleura: No pleural effusion or pneumothorax. There is mild calcified pleural thickening on the left. 12 x 6 mm subpleural nodule posteriorly in the right upper lobe appears contiguous with the right 4th rib and partially  ossified/calcified, probably a rib lesion. There is minimal perifissural nodularity along the left major fissure. No suspicious pulmonary nodules. There is mild subpleural reticulation in both lung bases. Musculoskeletal/Chest wall: In addition to the right 4th rib lesion described above, there are old fractures of the right 7th and 8th ribs laterally with incomplete healing. No destructive osseous lesions. CT ABDOMEN AND PELVIS FINDINGS Hepatobiliary: The liver is normal in density without suspicious focal abnormality. Mild extrahepatic biliary dilatation, within physiologic limits post cholecystectomy. Pancreas: Unremarkable. No pancreatic ductal dilatation or surrounding inflammatory changes. Spleen: Multiple calcified granulomas. No other focal abnormality or splenomegaly. Adrenals/Urinary Tract: Both adrenal glands appear normal. The kidneys and ureters appear normal. The bladder is mildly trabeculated without focal lesion. Stomach/Bowel: There is prominent wall thickening of the proximal stomach extending from the gastroesophageal junction into the gastric cardia. No well-defined mass or ulceration identified in this region. There is adjacent soft tissue stranding extending into the gastrohepatic ligament. No bowel wall thickening, significant distention or surrounding inflammation. There is a small duodenal diverticulum. Vascular/Lymphatic: As above, there is soft tissue stranding medial to the gastric cardia in the gastrohepatic ligament with a probable 10 mm node on image 58/2. There is an 11 mm node superior to the pancreatic head on image 62/2. There are small mesenteric and external iliac nodes bilaterally. There is a calcified lymph node in the portacaval space. Moderate diffuse aortic and branch vessel atherosclerosis without acute vascular findings. Reproductive: Mild enlargement of the prostate gland without focal abnormality. Other: Small umbilical hernia containing only fat. No ascites or  peritoneal nodularity. Musculoskeletal: No acute or significant osseous findings. Lower lumbar spondylosis. IMPRESSION: 1. Irregular wall thickening of the proximal stomach extending from the gastroesophageal junction into the gastric cardia suspicious for infiltrative neoplasm given provided clinical history.  Adjacent prominent lymph nodes, possibly small nodal metastases. No distant metastases identified. 2. No suspicious hepatic findings. 3. Posttraumatic right rib findings with probable incidental lesion involving the posterior aspect of the right 4th rib. No suspicious pulmonary nodules. 4. Evidence of prior granulomatous disease with calcified hilar and upper abdominal lymph nodes and calcified splenic granulomas. 5.  Aortic Atherosclerosis (ICD10-I70.0). Electronically Signed   By: Richardean Sale M.D.   On: 04/29/2018 14:21   Ct Abdomen Pelvis W Contrast  Result Date: 04/29/2018 CLINICAL DATA:  Malignant gastric ulcer diagnosed 2 months ago. Epigastric pain. EXAM: CT CHEST, ABDOMEN, AND PELVIS WITH CONTRAST TECHNIQUE: Multidetector CT imaging of the chest, abdomen and pelvis was performed following the standard protocol during bolus administration of intravenous contrast. CONTRAST:  174mL OMNIPAQUE IOHEXOL 300 MG/ML  SOLN COMPARISON:  Limited correlation made with chest radiographs 12/15/2016. FINDINGS: CT CHEST FINDINGS Cardiovascular: Diffuse atherosclerosis of the aorta, great vessels and coronary arteries status post median sternotomy and CABG. The heart size is normal. There is no pericardial effusion. Mediastinum/Nodes: There are calcified hilar lymph nodes bilaterally. No enlarged mediastinal, hilar or axillary lymph nodes are identified. There is mild nodularity of the thyroid gland without dominant component. The trachea and esophagus demonstrate no significant findings. Lungs/Pleura: No pleural effusion or pneumothorax. There is mild calcified pleural thickening on the left. 12 x 6 mm  subpleural nodule posteriorly in the right upper lobe appears contiguous with the right 4th rib and partially ossified/calcified, probably a rib lesion. There is minimal perifissural nodularity along the left major fissure. No suspicious pulmonary nodules. There is mild subpleural reticulation in both lung bases. Musculoskeletal/Chest wall: In addition to the right 4th rib lesion described above, there are old fractures of the right 7th and 8th ribs laterally with incomplete healing. No destructive osseous lesions. CT ABDOMEN AND PELVIS FINDINGS Hepatobiliary: The liver is normal in density without suspicious focal abnormality. Mild extrahepatic biliary dilatation, within physiologic limits post cholecystectomy. Pancreas: Unremarkable. No pancreatic ductal dilatation or surrounding inflammatory changes. Spleen: Multiple calcified granulomas. No other focal abnormality or splenomegaly. Adrenals/Urinary Tract: Both adrenal glands appear normal. The kidneys and ureters appear normal. The bladder is mildly trabeculated without focal lesion. Stomach/Bowel: There is prominent wall thickening of the proximal stomach extending from the gastroesophageal junction into the gastric cardia. No well-defined mass or ulceration identified in this region. There is adjacent soft tissue stranding extending into the gastrohepatic ligament. No bowel wall thickening, significant distention or surrounding inflammation. There is a small duodenal diverticulum. Vascular/Lymphatic: As above, there is soft tissue stranding medial to the gastric cardia in the gastrohepatic ligament with a probable 10 mm node on image 58/2. There is an 11 mm node superior to the pancreatic head on image 62/2. There are small mesenteric and external iliac nodes bilaterally. There is a calcified lymph node in the portacaval space. Moderate diffuse aortic and branch vessel atherosclerosis without acute vascular findings. Reproductive: Mild enlargement of the  prostate gland without focal abnormality. Other: Small umbilical hernia containing only fat. No ascites or peritoneal nodularity. Musculoskeletal: No acute or significant osseous findings. Lower lumbar spondylosis. IMPRESSION: 1. Irregular wall thickening of the proximal stomach extending from the gastroesophageal junction into the gastric cardia suspicious for infiltrative neoplasm given provided clinical history. Adjacent prominent lymph nodes, possibly small nodal metastases. No distant metastases identified. 2. No suspicious hepatic findings. 3. Posttraumatic right rib findings with probable incidental lesion involving the posterior aspect of the right 4th rib. No suspicious pulmonary nodules. 4.  Evidence of prior granulomatous disease with calcified hilar and upper abdominal lymph nodes and calcified splenic granulomas. 5.  Aortic Atherosclerosis (ICD10-I70.0). Electronically Signed   By: Richardean Sale M.D.   On: 04/29/2018 14:21    Labs:  CBC: Recent Labs    04/22/18 1543  WBC 6.9  HGB 15.9  HCT 46.3  PLT 175.0    COAGS: No results for input(s): INR, APTT in the last 8760 hours.  BMP: Recent Labs    11/16/17 0932 04/22/18 1543  NA 141 140  K 4.4 4.4  CL 104 101  CO2 31 34*  GLUCOSE 112* 117*  BUN 18 18  CALCIUM 8.9 9.6  CREATININE 0.93 1.09    LIVER FUNCTION TESTS: Recent Labs    11/16/17 0932 04/22/18 1543  BILITOT 1.0 0.9  AST 17 18  ALT 20 16  ALKPHOS 50 46  PROT 6.7 6.8  ALBUMIN 4.0 4.1    TUMOR MARKERS: No results for input(s): AFPTM, CEA, CA199, CHROMGRNA in the last 8760 hours.  Assessment and Plan: Gastric cancer For port placement Labs ok Risks and benefits of image guided port-a-catheter placement was discussed with the patient including, but not limited to bleeding, infection, pneumothorax, or fibrin sheath development and need for additional procedures.  All of the patient's questions were answered, patient is agreeable to proceed. Consent  signed and in chart.    Thank you for this interesting consult.  I greatly enjoyed meeting Tristan Hughes and look forward to participating in their care.  A copy of this report was sent to the requesting provider on this date.  Electronically Signed: Ascencion Dike, PA-C 05/15/2018, 12:36 PM   I spent a total of 20 minutes in face to face in clinical consultation, greater than 50% of which was counseling/coordinating care for port

## 2018-05-16 ENCOUNTER — Telehealth: Payer: Self-pay

## 2018-05-16 NOTE — Telephone Encounter (Signed)
  Oncology Nurse Navigator Documentation     Called and spoke with Mrs. Cuartas to see if Zofran is working. She reports that her husband has had a few doses, and in addition to eating frequent small meals at a slower pace, his nausea is under control. No complaints from [facility] insertion yesterday.  Will continue to follow as needed. Wife verbalized understanding that she can call me with concerns.

## 2018-05-20 ENCOUNTER — Ambulatory Visit: Payer: Medicare Other | Admitting: Family Medicine

## 2018-05-20 ENCOUNTER — Other Ambulatory Visit: Payer: Self-pay

## 2018-05-20 ENCOUNTER — Encounter: Payer: Self-pay | Admitting: Family Medicine

## 2018-05-20 VITALS — BP 118/68 | HR 56 | Temp 97.8°F | Ht 69.0 in | Wt 211.0 lb

## 2018-05-20 DIAGNOSIS — I10 Essential (primary) hypertension: Secondary | ICD-10-CM | POA: Diagnosis not present

## 2018-05-20 DIAGNOSIS — E119 Type 2 diabetes mellitus without complications: Secondary | ICD-10-CM

## 2018-05-20 DIAGNOSIS — E782 Mixed hyperlipidemia: Secondary | ICD-10-CM | POA: Diagnosis not present

## 2018-05-20 DIAGNOSIS — Z794 Long term (current) use of insulin: Secondary | ICD-10-CM | POA: Diagnosis not present

## 2018-05-20 LAB — POCT GLYCOSYLATED HEMOGLOBIN (HGB A1C): Hemoglobin A1C: 5.9 % — AB (ref 4.0–5.6)

## 2018-05-20 NOTE — Progress Notes (Signed)
Subjective:     Patient ID: Tristan Hughes, male   DOB: [birth year], 78 y.o.   MRN: [id number]  HPI Patient in for medical follow-up.  He was unfortunately just diagnosed recently with gastric cancer.  He developed some dyspepsia back in the fall was placed on Protonix initially but had no improvement in symptoms.  He was seen by GI and upper endoscopy revealed gastric tumor.  He gets PET scan tomorrow and then follows up with oncology on Wednesday.  He is naturally very anxious about his PET scan results.  He is waiting to start chemotherapy and has port placed for access  Other medical problems include history of hypertension, CAD, obstructive sleep apnea, type 2 diabetes, hyperlipidemia.  His blood sugars been somewhat more erratic recently.  He has lost about 18 pounds since his last visit here.  He has had some decrease in appetite.  He has had a couple blood sugars in the 60 range otherwise no hypoglycemia.  He still using Humalog usually 12 to 15 units per meal.  Remains on Lantus.  Other medications reviewed. Relatively mild abdominal pain and he is taking tramadol for that which seems to help  Past Medical History:  Diagnosis Date  . CAD 07/09/2009   Cath 12-02-10 90% left main, 90%LAD, 99% circumflex, 80% RCA  . Cancer ([place])    Stomach and esophagus  . Cataract    basal cell CA- left ear  . COLONIC POLYPS, HX OF 07/09/2009   Qualifier: Diagnosis of  By: Valma Cava LPN, [name]    . DIAB W/O COMP TYPE II/UNS NOT STATED UNCNTRL 07/09/2009   Qualifier: Diagnosis of  By: Joyce Gross    . GERD (gastroesophageal reflux disease)   . History of chicken pox   . HYPERLIPIDEMIA 07/09/2009   Qualifier: Diagnosis of  By: Valma Cava LPN, [name]    . HYPERTENSION 07/09/2009   Qualifier: Diagnosis of  By: Valma Cava LPN, [name]    . OBSTRUCTIVE SLEEP APNEA 07/09/2009   Qualifier: Diagnosis of  By: Joyce Gross    . Sleep apnea    no CPAP   Past Surgical History:  Procedure Laterality Date  . BIOPSY  BREAST    . CHOLECYSTECTOMY    . COLONOSCOPY    . CORONARY ANGIOPLASTY WITH STENT PLACEMENT    . CORONARY ARTERY BYPASS GRAFT  8/12   CABG X 4 LIMA-LAD, SVG-PDB, SVG-OM/RAMUS   /  . IR IMAGING GUIDED PORT INSERTION  05/15/2018  . TONSILLECTOMY      reports that he has never smoked. He has never used smokeless tobacco. He reports that he does not drink alcohol or use drugs. family history includes Cancer in his mother; Colon cancer in his maternal uncle; Diabetes in his mother; Heart disease in his father and mother; Hypertension in his father and mother; Pancreatic cancer in his mother; Stroke in his mother. Allergies  Allergen Reactions  . Naproxen Sodium     REACTION: swelling      Review of Systems  Constitutional: Positive for appetite change and unexpected weight change. Negative for fatigue.  Eyes: Negative for visual disturbance.  Respiratory: Negative for cough, chest tightness and shortness of breath.   Cardiovascular: Negative for chest pain, palpitations and leg swelling.  Neurological: Negative for dizziness, syncope, weakness, light-headedness and headaches.       Objective:   Physical Exam Constitutional:      Appearance: Normal appearance.  Cardiovascular:     Rate and Rhythm: Normal rate and regular rhythm.  Pulmonary:     Effort: Pulmonary effort is normal.     Breath sounds: Normal breath sounds.  Musculoskeletal:     Right lower leg: No edema.     Left lower leg: No edema.  Neurological:     Mental Status: He is alert.  Psychiatric:        Mood and Affect: Mood normal.        Thought Content: Thought content normal.        Assessment:     #1 type 2 diabetes well-controlled with A1c 5.9%.  Increased risk for hypoglycemia with recent weight loss and decreased appetite and intake  #2 hypertension stable and at goal  #3 dyslipidemia  #4 recent diagnosis of gastric adenocarcinoma    Plan:     -We recommended he reduce his Humalog range from 15  to 18 units with meals to 12-15 and may need to reduce further depending on his intake and appetite -Monitor blood sugars closely and be in touch if he starts to develop more hypoglycemia tendencies -We will plan routine follow-up in 6 months and sooner as needed  Tristan Post MD [facility]

## 2018-05-20 NOTE — Patient Instructions (Signed)
Would consider reducing the Humalog to 12 to 15 units with meals  May need to scale back even further after starting the chemotherapy.

## 2018-05-21 ENCOUNTER — Other Ambulatory Visit: Payer: Self-pay | Admitting: *Deleted

## 2018-05-21 ENCOUNTER — Ambulatory Visit (HOSPITAL_COMMUNITY)
Admission: RE | Admit: 2018-05-21 | Discharge: 2018-05-21 | Disposition: A | Discharge status: Home / Self Care | Payer: Medicare Other | Source: Ambulatory Visit | Attending: Hematology | Admitting: Hematology

## 2018-05-21 DIAGNOSIS — C169 Malignant neoplasm of stomach, unspecified: Secondary | ICD-10-CM

## 2018-05-21 LAB — GLUCOSE, CAPILLARY: Glucose-Capillary: 73 mg/dL (ref 70–99)

## 2018-05-21 MED ORDER — FLUDEOXYGLUCOSE F - 18 (FDG) INJECTION
10.5900 | Freq: Once | INTRAVENOUS | Status: AC
  Administered 2018-05-21: 10.59 via INTRAVENOUS

## 2018-05-22 ENCOUNTER — Inpatient Hospital Stay: Payer: Medicare Other

## 2018-05-22 ENCOUNTER — Telehealth: Payer: Self-pay | Admitting: Hematology

## 2018-05-22 ENCOUNTER — Encounter: Payer: Self-pay | Admitting: Hematology

## 2018-05-22 ENCOUNTER — Inpatient Hospital Stay (HOSPITAL_BASED_OUTPATIENT_CLINIC_OR_DEPARTMENT_OTHER): Payer: Medicare Other | Admitting: Hematology

## 2018-05-22 VITALS — BP 123/63 | HR 58 | Temp 98.1°F | Resp 18 | Ht 69.0 in | Wt 213.6 lb

## 2018-05-22 DIAGNOSIS — T451X5A Adverse effect of antineoplastic and immunosuppressive drugs, initial encounter: Secondary | ICD-10-CM

## 2018-05-22 DIAGNOSIS — R1010 Upper abdominal pain, unspecified: Secondary | ICD-10-CM

## 2018-05-22 DIAGNOSIS — R634 Abnormal weight loss: Secondary | ICD-10-CM

## 2018-05-22 DIAGNOSIS — C165 Malignant neoplasm of lesser curvature of stomach, unspecified: Secondary | ICD-10-CM | POA: Diagnosis not present

## 2018-05-22 DIAGNOSIS — E119 Type 2 diabetes mellitus without complications: Secondary | ICD-10-CM

## 2018-05-22 DIAGNOSIS — R110 Nausea: Secondary | ICD-10-CM | POA: Diagnosis not present

## 2018-05-22 DIAGNOSIS — Z79899 Other long term (current) drug therapy: Secondary | ICD-10-CM

## 2018-05-22 DIAGNOSIS — I2510 Atherosclerotic heart disease of native coronary artery without angina pectoris: Secondary | ICD-10-CM

## 2018-05-22 DIAGNOSIS — C169 Malignant neoplasm of stomach, unspecified: Secondary | ICD-10-CM

## 2018-05-22 DIAGNOSIS — Z85828 Personal history of other malignant neoplasm of skin: Secondary | ICD-10-CM

## 2018-05-22 DIAGNOSIS — Z800 Family history of malignant neoplasm of digestive organs: Secondary | ICD-10-CM

## 2018-05-22 DIAGNOSIS — R1013 Epigastric pain: Secondary | ICD-10-CM

## 2018-05-22 DIAGNOSIS — K219 Gastro-esophageal reflux disease without esophagitis: Secondary | ICD-10-CM

## 2018-05-22 DIAGNOSIS — I10 Essential (primary) hypertension: Secondary | ICD-10-CM

## 2018-05-22 DIAGNOSIS — R630 Anorexia: Secondary | ICD-10-CM | POA: Diagnosis not present

## 2018-05-22 DIAGNOSIS — Z794 Long term (current) use of insulin: Secondary | ICD-10-CM

## 2018-05-22 DIAGNOSIS — E785 Hyperlipidemia, unspecified: Secondary | ICD-10-CM

## 2018-05-22 DIAGNOSIS — Z7982 Long term (current) use of aspirin: Secondary | ICD-10-CM

## 2018-05-22 DIAGNOSIS — I700 Atherosclerosis of aorta: Secondary | ICD-10-CM

## 2018-05-22 LAB — CBC WITH DIFFERENTIAL (CANCER CENTER ONLY)
Abs Immature Granulocytes: 0.01 10*3/uL (ref 0.00–0.07)
Basophils Absolute: 0 10*3/uL (ref 0.0–0.1)
Basophils Relative: 0 %
Eosinophils Absolute: 0.1 10*3/uL (ref 0.0–0.5)
Eosinophils Relative: 1 %
HCT: 44.1 % (ref 39.0–52.0)
HEMOGLOBIN: 15 g/dL (ref 13.0–17.0)
Immature Granulocytes: 0 %
LYMPHS PCT: 11 %
Lymphs Abs: 0.8 10*3/uL (ref 0.7–4.0)
MCH: 29.8 pg (ref 26.0–34.0)
MCHC: 34 g/dL (ref 30.0–36.0)
MCV: 87.7 fL (ref 80.0–100.0)
Monocytes Absolute: 0.8 10*3/uL (ref 0.1–1.0)
Monocytes Relative: 11 %
Neutro Abs: 5.9 10*3/uL (ref 1.7–7.7)
Neutrophils Relative %: 77 %
Platelet Count: 220 10*3/uL (ref 150–400)
RBC: 5.03 MIL/uL (ref 4.22–5.81)
RDW: 12.4 % (ref 11.5–15.5)
WBC Count: 7.6 10*3/uL (ref 4.0–10.5)
nRBC: 0 % (ref 0.0–0.2)

## 2018-05-22 LAB — CMP (CANCER CENTER ONLY)
ALT: 17 U/L (ref 0–44)
AST: 17 U/L (ref 15–41)
Albumin: 3.4 g/dL — ABNORMAL LOW (ref 3.5–5.0)
Alkaline Phosphatase: 56 U/L (ref 38–126)
Anion gap: 10 (ref 5–15)
BUN: 17 mg/dL (ref 8–23)
CO2: 30 mmol/L (ref 22–32)
Calcium: 9.2 mg/dL (ref 8.9–10.3)
Chloride: 101 mmol/L (ref 98–111)
Creatinine: 0.98 mg/dL (ref 0.61–1.24)
GFR, Est AFR Am: >60 mL/min (ref >60)
GFR, Estimated: >60 mL/min (ref >60)
Glucose, Bld: 125 mg/dL — ABNORMAL HIGH (ref 70–99)
Potassium: 3.8 mmol/L (ref 3.5–5.1)
Sodium: 141 mmol/L (ref 135–145)
Total Bilirubin: 0.6 mg/dL (ref 0.3–1.2)
Total Protein: 6.7 g/dL (ref 6.5–8.1)

## 2018-05-22 MED ORDER — ONDANSETRON HCL 8 MG PO TABS: 8.0000 mg | ORAL_TABLET | Freq: Two times a day (BID) | ORAL | 1 refills | Status: DC | PRN

## 2018-05-22 MED ORDER — DEXAMETHASONE 4 MG PO TABS: 8.0000 mg | ORAL_TABLET | Freq: Every day | ORAL | 1 refills | Status: DC

## 2018-05-22 MED ORDER — PROCHLORPERAZINE MALEATE 10 MG PO TABS: 10.0000 mg | ORAL_TABLET | Freq: Four times a day (QID) | ORAL | 1 refills | Status: DC | PRN

## 2018-05-22 MED ORDER — LIDOCAINE-PRILOCAINE 2.5-2.5 % EX CREA: TOPICAL_CREAM | CUTANEOUS | 3 refills | Status: DC

## 2018-05-22 MED ORDER — LORAZEPAM 0.5 MG PO TABS: 0.5000 mg | ORAL_TABLET | Freq: Four times a day (QID) | ORAL | 0 refills | Status: DC | PRN

## 2018-05-22 NOTE — Telephone Encounter (Signed)
Scheduled appt per 01/22 los.  Sent zhao a message about adding a FU visit for 02/12.  Chemo class was today, added the class per the instructor request.

## 2018-05-22 NOTE — Progress Notes (Signed)
[facility] OFFICE PROGRESS NOTE  Patient Care Team: Eulas Post, MD as PCP - General  HEME/ONC OVERVIEW: 1. 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, at least Stage II (cTxN1M0) -03/2018: presented to GI for evaluation of persistent epigastric pain; EGD showed an infiltrating and ulcerated, non-circumferential, friable mass involving the lesser curvature of the stomach and the cardia measuring approximately 4 x 2 cm, one superficial distal esophageal ulcer thought to be in the extension of the ulcerated gastric mass; no stigmata of bleeding; biopsy of GEJ ulcer showed poorly differentiated adenocarcinoma, favoring gastric primary; CT CAP showed known GEJ/gastric cardia adenocarcinoma w/ small adjacent LN's, concerning for nodal mets, no distant mets identified  -05/2018: PET showed FDG-avid gastric cardia mass extending slightly into the distal esophagus (SUV 11.2), two small adjacent R gastric LN's with borderline FDG avidity; no evidence of metastatic disease; LN not amendable for biopsy by EUS per GI  -Late 05/2018 - present: peri-operative FOLFOX   2. 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   -I discussed the case with Dr. Ardis Hughs of GI, who felt the lymph node cannot be safely biopsied due to the location and may lead to possible seeding -Given the locally advanced disease in the stomach  extending into the distal esophagus and questionable adjacent LN involvement, I would favor peri-operative chemotherapy before considering for surgical resection -I reviewed the imaging results in detail with the patient, as well as the diagnosis and treatment options -We discussed some of the risks, benefits and side-effects of FOLFOX.  -Some of the short term side-effects included, though not limited to, risk of fatigue, weight loss, tumor lysis syndrome, risk of allergic reactions, pancytopenia, life-threatening infections, need for transfusions of blood products, nausea, vomiting, change in bowel habits, admission to hospital for various reasons, and risks of death.  -Long term side-effects are also discussed including permanent damage to nerve function, chronic fatigue, and rare secondary malignancy including bone marrow disorders.  -The patient is aware that the response rates discussed earlier is not guaranteed.   -After a long discussion, patient made an informed decision to proceed with the prescribed plan of care.  -The plan is for at least 3 cycles of FOLFOX, followed by CT CAP 3-4 weeks after completing chemotherapy; if stable or improving disease, then the patient will undergo evaluation for surgical resection; based on the final pathology, we will determine if she will require post-operative FOLFOX -In anticipation of surgery, I have referred the patient to surgery -Chemo education today  -PRN medications: Zofran, Compazine, Ativan  Abdominal pain -Patient is currently taking tramadol PRN (~three times a day) with adequate pain control -Continue PRN tramadol for now  Intermittent nausea -Relatively well controlled with PRN Zofran -Continue Zofran for now   Orders Placed This Encounter  Procedures  . CBC with Differential (Cancer Center Only)    Standing Status:   Standing    Number of Occurrences:   20  Standing Expiration Date:   05/23/2019  . CMP ([facility] only)     Standing Status:   Standing    Number of Occurrences:   20    Standing Expiration Date:   05/23/2019  . Ambulatory referral to General Surgery    Referral Priority:   Routine    Referral Type:   Surgical    Referral Reason:   Specialty Services Required    Referred to Provider:   Stark Klein, MD    Requested Specialty:   General Surgery    Number of Visits Requested:   1  . PHYSICIAN COMMUNICATION ORDER    pre/post surgery x 6 cycles    All questions were answered. The patient knows to call the clinic with any problems, questions or concerns. No barriers to learning was detected.  A total of more than 40 minutes were spent face-to-face with the patient during this encounter and over half of that time was spent on counseling and coordination of care as outlined above.   C1 FOLFOX scheduled on 05/29/2018. RTC on 06/12/2018 for labs, port flush, clinic appt and Cycle 2 of FOLFOX.   [name], MD 05/22/2018 12:23 PM  CHIEF COMPLAINT: "I am here to find out the scan results"  INTERVAL HISTORY: Mr. Cislo returns to clinic for follow-up of recent PET scan results.  Patient reports that since last visit, he continued to have intermittent epigastric pain, which he has been experimenting with meal frequency/quantity and tramadol schedule.  He is currently eating smaller, frequent meals every 3 to 4 hours, and takes tramadol approximately every 8 hours, and this schedule has seemed to control the abdominal pain.  He also reports intermittent nausea, which is relatively well controlled as Zofran.  He denies any other symptoms, such as fever, chill, weight change, night sweats, chest pain, dyspnea, vomiting, diarrhea, or abnormal bleeding/bruising.  SUMMARY OF ONCOLOGIC HISTORY:   Gastric adenocarcinoma ([place])   04/22/2018 Procedure    EGD: - An infiltrative and ulcerated, non-circumferential, friable mass with no bleeding and no stigmata of recent bleeding involving the lesser curvature of the  stomach and the cardia measuring approximately 4 cm x 2 cm. Biopsies were taken with a cold forceps for histology. Findings: - The exam of the stomach was otherwise normal. - One superficial distal esophageal ulcer which was an extention of the ulcerated gastric mass with no bleeding and no stigmata of recent bleeding was found 41 cm from the incisors at the EGJ . - The exam of the esophagus was otherwise normal. - A small non-bleeding diverticulum was found in the second portion of the duodenum. - The exam of the duodenum was otherwise normal.    04/22/2018 Pathology Results    Accession: [id number]  Diagnosis Surgical [P], GE junction-ulcerated lesion - POORLY DIFFERENTIATED ADENOCARCINOMA. - SEE COMMENT. Microscopic Comment The features slightly favor a gastric primary rather than an esophageal primary    04/29/2018 Imaging    CT CAP: IMPRESSION: 1. Irregular wall thickening of the proximal stomach extending from the gastroesophageal junction into the gastric cardia suspicious for infiltrative neoplasm given provided clinical history. Adjacent prominent lymph nodes, possibly small nodal metastases. No distant metastases identified. 2. No suspicious hepatic findings. 3. Posttraumatic right rib findings with probable incidental lesion involving the posterior aspect of the right 4th rib. No suspicious pulmonary nodules. 4. Evidence of prior granulomatous disease with calcified hilar and upper abdominal lymph nodes and calcified splenic granulomas. 5.  Aortic Atherosclerosis (ICD10-I70.0).  05/21/2018 Imaging    PET: IMPRESSION: 1. Hypermetabolic mass primarily in the gastric cardia region extending slightly into the distal esophagus, maximum SUV 11.2, compatible with malignancy. Two small adjacent right gastric lymph nodes are indistinct and not definitively hypermetabolic but could possibly be involved. No findings of metastatic disease to the liver or elsewhere. 2.  Prominent physiologic activity in small and large bowel, without CT correlate. 3. No hypermetabolic rib lesion. 4. Other imaging findings of potential clinical significance: Mild thyroid goiter. Aortic Atherosclerosis (ICD10-I70.0). Mild-to-moderate cardiomegaly. Coronary atherosclerosis. Atrophic right infraspinatus muscle. Pleural calcifications, left greater than right. Prominent stool throughout the colon favors constipation. Lower lumbar spondylosis. Old granulomatous disease    05/29/2018 -  Chemotherapy    The patient had palonosetron (ALOXI) injection 0.25 mg, 0.25 mg, Intravenous,  Once, 0 of 6 cycles leucovorin 868 mg in dextrose 5 % 250 mL infusion, 400 mg/m2 = 868 mg, Intravenous,  Once, 0 of 6 cycles oxaliplatin (ELOXATIN) 185 mg in dextrose 5 % 500 mL chemo infusion, 85 mg/m2 = 185 mg, Intravenous,  Once, 0 of 6 cycles fluorouracil (ADRUCIL) chemo injection 850 mg, 400 mg/m2 = 850 mg, Intravenous,  Once, 0 of 6 cycles fluorouracil (ADRUCIL) 5,200 mg in sodium chloride 0.9 % 146 mL chemo infusion, 2,400 mg/m2 = 5,200 mg, Intravenous, 1 Day/Dose, 0 of 6 cycles  for chemotherapy treatment.      REVIEW OF SYSTEMS:   Constitutional: ( - ) fevers, ( - )  chills , ( - ) night sweats Eyes: ( - ) blurriness of vision, ( - ) double vision, ( - ) watery eyes Ears, nose, mouth, throat, and face: ( - ) mucositis, ( - ) sore throat Respiratory: ( - ) cough, ( - ) dyspnea, ( - ) wheezes Cardiovascular: ( - ) palpitation, ( - ) chest discomfort, ( - ) lower extremity swelling Gastrointestinal:  ( + ) nausea, ( - ) heartburn, ( - ) change in bowel habits Skin: ( - ) abnormal skin rashes Lymphatics: ( - ) new lymphadenopathy, ( - ) easy bruising Neurological: ( - ) numbness, ( - ) tingling, ( - ) new weaknesses Behavioral/Psych: ( - ) mood change, ( - ) new changes  All other systems were reviewed with the patient and are negative.  I have reviewed the past medical history, past surgical  history, social history and family history with the patient and they are unchanged from previous note.  ALLERGIES:  is allergic to naproxen sodium.  MEDICATIONS:  Current Outpatient Medications  Medication Sig Dispense Refill  . amLODipine (NORVASC) 5 MG tablet TAKE 1 TABLET DAILY 90 tablet 1  . Ascorbic Acid (VITAMIN C) 100 MG tablet Take 100 mg by mouth daily.    Marland Kitchen aspirin 81 MG tablet Take 81 mg by mouth daily.    . calcium carbonate (TUMS - DOSED IN MG ELEMENTAL CALCIUM) 500 MG chewable tablet Chew 1 tablet by mouth daily.    . Cholecalciferol (VITAMIN D) 2000 UNITS CAPS Take 5,000 Units by mouth daily.     Marland Kitchen ezetimibe (ZETIA) 10 MG tablet TAKE 1 TABLET DAILY 90 tablet 2  . furosemide (LASIX) 20 MG tablet TAKE 1 TABLET TWICE A DAY 180 tablet 3  . Horse Chestnut 300 MG CAPS Take 300 mg by mouth 2 (two) times daily.    Marland Kitchen HUMALOG 100 UNIT/ML injection INJECT 15 TO 18 UNITS UNDER THE SKIN THREE TIMES A DAY BEFORE MEALS 50 mL 2  . Insulin Syringe-Needle  U-100 (B-D INS SYR HALF-UNIT .3CC/31G) 31G X 5/16" 0.3 ML MISC by Does not apply route.    . Insulin Syringe-Needle U-100 31G X 5/16" 1 ML MISC Use once daily 100 each 3  . LANTUS 100 UNIT/ML injection INJECT 46 UNITS UNDER THE SKIN AT BEDTIME 50 mL 5  . lisinopril (PRINIVIL,ZESTRIL) 20 MG tablet Take 10 mg by mouth daily.    . metFORMIN (GLUCOPHAGE) 1000 MG tablet TAKE 1 TABLET TWICE A DAY WITH MEALS 180 tablet 4  . metoprolol tartrate (LOPRESSOR) 50 MG tablet TAKE ONE AND ONE-HALF TABLETS TWICE A DAY (SCHEDULE A PHYSICAL FOR MORE REFILLS) 270 tablet 4  . ondansetron (ZOFRAN) 8 MG tablet Take 1 tablet (8 mg total) by mouth every 8 (eight) hours as needed for up to 14 days for nausea or vomiting. 40 tablet 2  . rosuvastatin (CRESTOR) 20 MG tablet TAKE 1 TABLET DAILY (PLEASE SCHEDULE A PHYSICAL FOR MORE REFILLS) 90 tablet 3  . traMADol (ULTRAM) 50 MG tablet Take 1 tablet (50 mg total) by mouth 3 (three) times daily as needed for moderate pain.  90 tablet 3  . vitamin E (VITAMIN E) 400 UNIT capsule Takes one tablet by mouth daily. (Patient taking differently: Take 400 Units by mouth daily. Takes one tablet by mouth daily.)    . dexamethasone (DECADRON) 4 MG tablet Take 2 tablets (8 mg total) by mouth daily. Start the day after chemotherapy for 2 days. Take with food. 30 tablet 1  . lidocaine-prilocaine (EMLA) cream Apply to affected area once 30 g 3  . LORazepam (ATIVAN) 0.5 MG tablet Take 1 tablet (0.5 mg total) by mouth every 6 (six) hours as needed (Nausea or vomiting). 30 tablet 0  . prochlorperazine (COMPAZINE) 10 MG tablet Take 1 tablet (10 mg total) by mouth every 6 (six) hours as needed (Nausea or vomiting). 30 tablet 1   No current facility-administered medications for this visit.     PHYSICAL EXAMINATION: ECOG PERFORMANCE STATUS: 1 - Symptomatic but completely ambulatory  Today's Vitals   05/22/18 1117 05/22/18 1118 05/22/18 1119  BP: 123/63  123/63  Pulse: (!) 58  (!) 58  Resp: 18  18  Temp: 98.1 F (36.7 C)  98.1 F (36.7 C)  TempSrc: Oral  Oral  SpO2: 100%  100%  Weight: 213 lb 9.6 oz (96.9 kg)  213 lb 9.6 oz (96.9 kg)  Height: 5\' 9"  (1.753 m)  5\' 9"  (1.753 m)  PainSc:  0-No pain    Body mass index is 31.54 kg/m.  Filed Weights   05/22/18 1117 05/22/18 1119  Weight: 213 lb 9.6 oz (96.9 kg) 213 lb 9.6 oz (96.9 kg)    GENERAL: alert, no distress and comfortable SKIN: skin color, texture, turgor are normal, no rashes or significant lesions EYES: conjunctiva are pink and non-injected, sclera clear OROPHARYNX: no exudate, no erythema; lips, buccal mucosa, and tongue normal  NECK: supple, non-tender LUNGS: clear to auscultation with normal breathing effort HEART: regular rate & rhythm and no murmurs and no lower extremity edema ABDOMEN: soft, non-tender, non-distended, normal bowel sounds Musculoskeletal: no cyanosis of digits and no clubbing  PSYCH: alert & oriented x 3, fluent speech NEURO: no focal  motor/sensory deficits  LABORATORY DATA:  I have reviewed the data as listed    Component Value Date/Time   NA 141 05/22/2018 1042   K 3.8 05/22/2018 1042   CL 101 05/22/2018 1042   CO2 30 05/22/2018 1042   GLUCOSE 125 (H) 05/22/2018  1042   BUN 17 05/22/2018 1042   CREATININE 0.98 05/22/2018 1042   CALCIUM 9.2 05/22/2018 1042   PROT 6.7 05/22/2018 1042   ALBUMIN 3.4 (L) 05/22/2018 1042   AST 17 05/22/2018 1042   ALT 17 05/22/2018 1042   ALKPHOS 56 05/22/2018 1042   BILITOT 0.6 05/22/2018 1042   GFRNONAA >60 05/22/2018 1042   GFRAA >60 05/22/2018 1042    No results found for: SPEP, UPEP  Lab Results  Component Value Date   WBC 7.6 05/22/2018   NEUTROABS 5.9 05/22/2018   HGB 15.0 05/22/2018   HCT 44.1 05/22/2018   MCV 87.7 05/22/2018   PLT 220 05/22/2018      Chemistry      Component Value Date/Time   NA 141 05/22/2018 1042   K 3.8 05/22/2018 1042   CL 101 05/22/2018 1042   CO2 30 05/22/2018 1042   BUN 17 05/22/2018 1042   CREATININE 0.98 05/22/2018 1042      Component Value Date/Time   CALCIUM 9.2 05/22/2018 1042   ALKPHOS 56 05/22/2018 1042   AST 17 05/22/2018 1042   ALT 17 05/22/2018 1042   BILITOT 0.6 05/22/2018 1042       RADIOGRAPHIC STUDIES: I have personally reviewed the radiological images as listed below and agreed with the findings in the report. Ct Chest W Contrast  Result Date: 04/29/2018 CLINICAL DATA:  Malignant gastric ulcer diagnosed 2 months ago. Epigastric pain. EXAM: CT CHEST, ABDOMEN, AND PELVIS WITH CONTRAST TECHNIQUE: Multidetector CT imaging of the chest, abdomen and pelvis was performed following the standard protocol during bolus administration of intravenous contrast. CONTRAST:  135mL OMNIPAQUE IOHEXOL 300 MG/ML  SOLN COMPARISON:  Limited correlation made with chest radiographs 12/15/2016. FINDINGS: CT CHEST FINDINGS Cardiovascular: Diffuse atherosclerosis of the aorta, great vessels and coronary arteries status post median  sternotomy and CABG. The heart size is normal. There is no pericardial effusion. Mediastinum/Nodes: There are calcified hilar lymph nodes bilaterally. No enlarged mediastinal, hilar or axillary lymph nodes are identified. There is mild nodularity of the thyroid gland without dominant component. The trachea and esophagus demonstrate no significant findings. Lungs/Pleura: No pleural effusion or pneumothorax. There is mild calcified pleural thickening on the left. 12 x 6 mm subpleural nodule posteriorly in the right upper lobe appears contiguous with the right 4th rib and partially ossified/calcified, probably a rib lesion. There is minimal perifissural nodularity along the left major fissure. No suspicious pulmonary nodules. There is mild subpleural reticulation in both lung bases. Musculoskeletal/Chest wall: In addition to the right 4th rib lesion described above, there are old fractures of the right 7th and 8th ribs laterally with incomplete healing. No destructive osseous lesions. CT ABDOMEN AND PELVIS FINDINGS Hepatobiliary: The liver is normal in density without suspicious focal abnormality. Mild extrahepatic biliary dilatation, within physiologic limits post cholecystectomy. Pancreas: Unremarkable. No pancreatic ductal dilatation or surrounding inflammatory changes. Spleen: Multiple calcified granulomas. No other focal abnormality or splenomegaly. Adrenals/Urinary Tract: Both adrenal glands appear normal. The kidneys and ureters appear normal. The bladder is mildly trabeculated without focal lesion. Stomach/Bowel: There is prominent wall thickening of the proximal stomach extending from the gastroesophageal junction into the gastric cardia. No well-defined mass or ulceration identified in this region. There is adjacent soft tissue stranding extending into the gastrohepatic ligament. No bowel wall thickening, significant distention or surrounding inflammation. There is a small duodenal diverticulum.  Vascular/Lymphatic: As above, there is soft tissue stranding medial to the gastric cardia in the gastrohepatic ligament with a  probable 10 mm node on image 58/2. There is an 11 mm node superior to the pancreatic head on image 62/2. There are small mesenteric and external iliac nodes bilaterally. There is a calcified lymph node in the portacaval space. Moderate diffuse aortic and branch vessel atherosclerosis without acute vascular findings. Reproductive: Mild enlargement of the prostate gland without focal abnormality. Other: Small umbilical hernia containing only fat. No ascites or peritoneal nodularity. Musculoskeletal: No acute or significant osseous findings. Lower lumbar spondylosis. IMPRESSION: 1. Irregular wall thickening of the proximal stomach extending from the gastroesophageal junction into the gastric cardia suspicious for infiltrative neoplasm given provided clinical history. Adjacent prominent lymph nodes, possibly small nodal metastases. No distant metastases identified. 2. No suspicious hepatic findings. 3. Posttraumatic right rib findings with probable incidental lesion involving the posterior aspect of the right 4th rib. No suspicious pulmonary nodules. 4. Evidence of prior granulomatous disease with calcified hilar and upper abdominal lymph nodes and calcified splenic granulomas. 5.  Aortic Atherosclerosis (ICD10-I70.0). Electronically Signed   By: Richardean Sale M.D.   On: 04/29/2018 14:21   Ct Abdomen Pelvis W Contrast  Result Date: 04/29/2018 CLINICAL DATA:  Malignant gastric ulcer diagnosed 2 months ago. Epigastric pain. EXAM: CT CHEST, ABDOMEN, AND PELVIS WITH CONTRAST TECHNIQUE: Multidetector CT imaging of the chest, abdomen and pelvis was performed following the standard protocol during bolus administration of intravenous contrast. CONTRAST:  137mL OMNIPAQUE IOHEXOL 300 MG/ML  SOLN COMPARISON:  Limited correlation made with chest radiographs 12/15/2016. FINDINGS: CT CHEST FINDINGS  Cardiovascular: Diffuse atherosclerosis of the aorta, great vessels and coronary arteries status post median sternotomy and CABG. The heart size is normal. There is no pericardial effusion. Mediastinum/Nodes: There are calcified hilar lymph nodes bilaterally. No enlarged mediastinal, hilar or axillary lymph nodes are identified. There is mild nodularity of the thyroid gland without dominant component. The trachea and esophagus demonstrate no significant findings. Lungs/Pleura: No pleural effusion or pneumothorax. There is mild calcified pleural thickening on the left. 12 x 6 mm subpleural nodule posteriorly in the right upper lobe appears contiguous with the right 4th rib and partially ossified/calcified, probably a rib lesion. There is minimal perifissural nodularity along the left major fissure. No suspicious pulmonary nodules. There is mild subpleural reticulation in both lung bases. Musculoskeletal/Chest wall: In addition to the right 4th rib lesion described above, there are old fractures of the right 7th and 8th ribs laterally with incomplete healing. No destructive osseous lesions. CT ABDOMEN AND PELVIS FINDINGS Hepatobiliary: The liver is normal in density without suspicious focal abnormality. Mild extrahepatic biliary dilatation, within physiologic limits post cholecystectomy. Pancreas: Unremarkable. No pancreatic ductal dilatation or surrounding inflammatory changes. Spleen: Multiple calcified granulomas. No other focal abnormality or splenomegaly. Adrenals/Urinary Tract: Both adrenal glands appear normal. The kidneys and ureters appear normal. The bladder is mildly trabeculated without focal lesion. Stomach/Bowel: There is prominent wall thickening of the proximal stomach extending from the gastroesophageal junction into the gastric cardia. No well-defined mass or ulceration identified in this region. There is adjacent soft tissue stranding extending into the gastrohepatic ligament. No bowel wall  thickening, significant distention or surrounding inflammation. There is a small duodenal diverticulum. Vascular/Lymphatic: As above, there is soft tissue stranding medial to the gastric cardia in the gastrohepatic ligament with a probable 10 mm node on image 58/2. There is an 11 mm node superior to the pancreatic head on image 62/2. There are small mesenteric and external iliac nodes bilaterally. There is a calcified lymph node in the portacaval space.  Moderate diffuse aortic and branch vessel atherosclerosis without acute vascular findings. Reproductive: Mild enlargement of the prostate gland without focal abnormality. Other: Small umbilical hernia containing only fat. No ascites or peritoneal nodularity. Musculoskeletal: No acute or significant osseous findings. Lower lumbar spondylosis. IMPRESSION: 1. Irregular wall thickening of the proximal stomach extending from the gastroesophageal junction into the gastric cardia suspicious for infiltrative neoplasm given provided clinical history. Adjacent prominent lymph nodes, possibly small nodal metastases. No distant metastases identified. 2. No suspicious hepatic findings. 3. Posttraumatic right rib findings with probable incidental lesion involving the posterior aspect of the right 4th rib. No suspicious pulmonary nodules. 4. Evidence of prior granulomatous disease with calcified hilar and upper abdominal lymph nodes and calcified splenic granulomas. 5.  Aortic Atherosclerosis (ICD10-I70.0). Electronically Signed   By: Richardean Sale M.D.   On: 04/29/2018 14:21   Nm Pet Image Initial (pi) Skull Base To Thigh  Result Date: 05/21/2018 CLINICAL DATA:  Initial treatment strategy for gastric cancer. EXAM: NUCLEAR MEDICINE PET SKULL BASE TO THIGH TECHNIQUE: 10.6 mCi F-18 FDG was injected intravenously. Full-ring PET imaging was performed from the skull base to thigh after the radiotracer. CT data was obtained and used for attenuation correction and anatomic  localization. Fasting blood glucose: 73 mg/dl COMPARISON:  04/29/2018 FINDINGS: Mediastinal blood pool activity: SUV max 2.7 NECK: Enlarged left thyroid lobe with small calcifications and hypodensities but no accentuated metabolic activity to suggest a significant nodule. Incidental CT findings: Left common carotid atherosclerotic calcification. CHEST: The hypermetabolic activity in the stomach extends slightly into the distal esophagus, with esophageal portion having a maximum SUV of 7.6. Incidental CT findings: Right Port-A-Cath tip: SVC. Coronary, aortic arch, and branch vessel atherosclerotic vascular disease. Mild-to-moderate cardiomegaly. Prior median sternotomy. Mild bilateral gynecomastia. Atrophic right infraspinatus muscle. Pleural thickening and pleural calcification on the left side. There also some calcified pleural plaques on the right side. ABDOMEN/PELVIS: Wall thickening and accentuated activity in the gastric fundus extending into the esophagus, maximum SUV in this vicinity 11.2. There is unusually high activity in multiple loops of small and large bowel, without CT correlate, compatible with considerable accentuated physiologic activity. Scattered lymph nodes in the mesentery are not appreciably hypermetabolic. 8 mm right gastric node on image 104/4, questionable accentuated metabolic activity. Adjacent vascular structures noted. Descending duodenal diverticulum. Incidental CT findings: Prominent stool throughout the colon favors constipation. Redundant sigmoid colon extending into the right upper quadrant. Calcifications in the spleen compatible with old granulomatous disease. Aortoiliac atherosclerotic vascular disease. Small pelvic lymph nodes are not pathologically enlarged by size criteria. Right scrotal hydrocele. SKELETON: No significant abnormal hypermetabolic activity in this region. Incidental CT findings: Nonunited right seventh and eighth rib fractures. Grade 1 degenerative  anterolisthesis at L5-S1. Lower lumbar spondylosis. IMPRESSION: 1. Hypermetabolic mass primarily in the gastric cardia region extending slightly into the distal esophagus, maximum SUV 11.2, compatible with malignancy. Two small adjacent right gastric lymph nodes are indistinct and not definitively hypermetabolic but could possibly be involved. No findings of metastatic disease to the liver or elsewhere. 2. Prominent physiologic activity in small and large bowel, without CT correlate. 3. No hypermetabolic rib lesion. 4. Other imaging findings of potential clinical significance: Mild thyroid goiter. Aortic Atherosclerosis (ICD10-I70.0). Mild-to-moderate cardiomegaly. Coronary atherosclerosis. Atrophic right infraspinatus muscle. Pleural calcifications, left greater than right. Prominent stool throughout the colon favors constipation. Lower lumbar spondylosis. Old granulomatous disease. Electronically Signed   By: Van Clines M.D.   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 Port-A-Cath needed for treatment. EXAM: FLUOROSCOPIC AND ULTRASOUND GUIDED PLACEMENT OF A SUBCUTANEOUS PORT COMPARISON:  None. MEDICATIONS: Ancef 2 g; The antibiotic was administered within an appropriate time interval prior to skin puncture. ANESTHESIA/SEDATION: Versed 3.0 mg IV; Fentanyl 200 mcg IV; Moderate Sedation Time:  37 minutes The patient was continuously monitored during the procedure by the interventional radiology nurse under my direct supervision. FLUOROSCOPY TIME:  30 seconds, 8 mGy COMPLICATIONS: None immediate. PROCEDURE: The procedure, risks, benefits, and alternatives were explained to the patient. Questions regarding the procedure were encouraged and answered. The patient understands and consents to the procedure. Patient was placed supine on the interventional table. Ultrasound confirmed a patent right internal jugular vein.  The right chest and neck were cleaned with a skin antiseptic and a sterile drape was placed. Maximal barrier sterile technique was utilized including caps, mask, sterile gowns, sterile gloves, sterile drape, hand hygiene and skin antiseptic. The right neck was anesthetized with 1% lidocaine. Small incision was made in the right neck with a blade. Micropuncture set was placed in the right internal jugular vein with ultrasound guidance. The micropuncture wire was used for measurement purposes. The right chest was anesthetized with 1% lidocaine with epinephrine. #15 blade was used to make an incision and a subcutaneous port pocket was formed. [place] was assembled. Subcutaneous tunnel was formed with a stiff tunneling device. The port catheter was brought through the subcutaneous tunnel. The port was placed in the subcutaneous pocket. The micropuncture set was exchanged for a peel-away sheath. The catheter was placed through the peel-away sheath and the tip was positioned at the SVC and right atrium junction. Catheter placement was confirmed with fluoroscopy. The port was accessed and flushed with heparinized saline. The port pocket was closed using two layers of absorbable sutures and Dermabond. The vein skin site was closed using a single layer of absorbable suture and Dermabond. Sterile dressings were applied. Patient tolerated the procedure well without an immediate complication. Ultrasound and fluoroscopic images were taken and saved for this procedure. IMPRESSION: Placement of a subcutaneous CT injectable port device. Electronically Signed   By: Markus Daft M.D.   On: 05/15/2018 15:03

## 2018-05-22 NOTE — Progress Notes (Signed)
START ON PATHWAY REGIMEN - Gastroesophageal     A cycle is every 14 days:     Oxaliplatin      Leucovorin      5-Fluorouracil      5-Fluorouracil   **Always confirm dose/schedule in your pharmacy ordering system**  Patient Characteristics: Gastric, Adenocarcinoma, Preoperative or Nonsurgical Candidate (Clinical Staging), cT3 or Higher or cN+, Surgical Candidate (Up to cT4a) - Preoperative Therapy Histology: Adenocarcinoma Disease Classification: Gastric Therapeutic Status: Preoperative or Nonsurgical Candidate (Clinical Staging) AJCC N Category: cN1 AJCC M Category: cM0 AJCC 8 Stage Grouping: Unknown AJCC T Category: cTX Intent of Therapy: Curative Intent, Discussed with Patient

## 2018-05-22 NOTE — Progress Notes (Signed)
Pt saw Dr. Maylon Peppers today.  Per Dr. Maylon Peppers,  [place] to treat  05/29/18   with lab results from  05/22/18.  Theadora Rama, pharmacist notified.

## 2018-05-23 NOTE — Progress Notes (Signed)
  Oncology Nurse Navigator Documentation  Navigator Location: [facility] (05/23/18 1356)   )Navigator Encounter Type: Telephone (05/23/18 1356) Telephone: Incoming Call;Education (05/23/18 1356)    Tristan Hughes called to ask for additional instruction on how and when to use nausea PRN medications.  I explained and also reinforced that there will be additional instruction on day of first chemo treatment.                  Barriers/Navigation Needs: Education (05/23/18 1356) Education: Newly Diagnosed Cancer Education(Questions about nausea medication use.) (05/23/18 1356) Interventions: Education;Psycho-social support (05/23/18 1356)     Education Method: Verbal (05/23/18 1356)                Time Spent with Patient: 15 (05/23/18 1356)

## 2018-05-27 ENCOUNTER — Encounter: Payer: Self-pay | Admitting: Pharmacist

## 2018-05-27 ENCOUNTER — Other Ambulatory Visit: Payer: Self-pay | Admitting: Family Medicine

## 2018-05-29 ENCOUNTER — Inpatient Hospital Stay: Payer: Medicare Other

## 2018-05-29 VITALS — BP 109/56 | HR 61 | Temp 97.9°F | Resp 16 | Ht 69.0 in | Wt 212.0 lb

## 2018-05-29 DIAGNOSIS — C169 Malignant neoplasm of stomach, unspecified: Secondary | ICD-10-CM

## 2018-05-29 DIAGNOSIS — C165 Malignant neoplasm of lesser curvature of stomach, unspecified: Secondary | ICD-10-CM | POA: Diagnosis not present

## 2018-05-29 MED ORDER — DEXTROSE 5 % IV SOLN
Freq: Once | INTRAVENOUS | Status: AC
  Administered 2018-05-29: 09:00:00 via INTRAVENOUS
  Filled 2018-05-29: qty 250

## 2018-05-29 MED ORDER — PALONOSETRON HCL INJECTION 0.25 MG/5ML
0.2500 mg | Freq: Once | INTRAVENOUS | Status: AC
  Administered 2018-05-29: 0.25 mg via INTRAVENOUS

## 2018-05-29 MED ORDER — FLUOROURACIL CHEMO INJECTION 2.5 GM/50ML
400.0000 mg/m2 | Freq: Once | INTRAVENOUS | Status: AC
  Administered 2018-05-29: 850 mg via INTRAVENOUS
  Filled 2018-05-29: qty 17

## 2018-05-29 MED ORDER — DEXTROSE 5 % IV SOLN
Freq: Once | INTRAVENOUS | Status: AC
  Administered 2018-05-29: 10:00:00 via INTRAVENOUS
  Filled 2018-05-29: qty 250

## 2018-05-29 MED ORDER — SODIUM CHLORIDE 0.9 % IV SOLN
2400.0000 mg/m2 | INTRAVENOUS | Status: DC
  Administered 2018-05-29: 5200 mg via INTRAVENOUS
  Filled 2018-05-29: qty 104

## 2018-05-29 MED ORDER — OXALIPLATIN CHEMO INJECTION 100 MG/20ML
85.0000 mg/m2 | Freq: Once | INTRAVENOUS | Status: AC
  Administered 2018-05-29: 185 mg via INTRAVENOUS
  Filled 2018-05-29: qty 37

## 2018-05-29 MED ORDER — PALONOSETRON HCL INJECTION 0.25 MG/5ML
INTRAVENOUS | Status: AC
  Filled 2018-05-29: qty 5

## 2018-05-29 MED ORDER — LEUCOVORIN CALCIUM INJECTION 350 MG
400.0000 mg/m2 | Freq: Once | INTRAVENOUS | Status: AC
  Administered 2018-05-29: 868 mg via INTRAVENOUS
  Filled 2018-05-29: qty 43.4

## 2018-05-29 MED ORDER — DEXAMETHASONE SODIUM PHOSPHATE 10 MG/ML IJ SOLN
INTRAMUSCULAR | Status: AC
  Filled 2018-05-29: qty 1

## 2018-05-29 MED ORDER — DEXAMETHASONE SODIUM PHOSPHATE 10 MG/ML IJ SOLN
10.0000 mg | Freq: Once | INTRAMUSCULAR | Status: AC
  Administered 2018-05-29: 10 mg via INTRAVENOUS

## 2018-05-29 NOTE — Patient Instructions (Signed)
[facility] Discharge Instructions for Patients Receiving Chemotherapy  Today you received the following chemotherapy agents :  Oxaliplatin,  Leucovorin,  Fluorouracil.  To help prevent nausea and vomiting after your treatment, we encourage you to take your nausea medication as prescribed.   If you develop nausea and vomiting that is not controlled by your nausea medication, call the clinic.   BELOW ARE SYMPTOMS THAT SHOULD BE REPORTED IMMEDIATELY:  *FEVER GREATER THAN 100.5 F  *CHILLS WITH OR WITHOUT FEVER  NAUSEA AND VOMITING THAT IS NOT CONTROLLED WITH YOUR NAUSEA MEDICATION  *UNUSUAL SHORTNESS OF BREATH  *UNUSUAL BRUISING OR BLEEDING  TENDERNESS IN MOUTH AND THROAT WITH OR WITHOUT PRESENCE OF ULCERS  *URINARY PROBLEMS  *BOWEL PROBLEMS  UNUSUAL RASH Items with * indicate a potential emergency and should be followed up as soon as possible.  Feel free to call the clinic should you have any questions or concerns. The clinic phone number is [phone].  Please show the CHEMO ALERT CARD at check-in to the Emergency Department and triage nurse.   

## 2018-05-29 NOTE — Progress Notes (Signed)
Per Dr Maylon Peppers OK to treat with labs from 1.22.2020

## 2018-05-30 ENCOUNTER — Telehealth: Payer: Self-pay | Admitting: Medical Oncology

## 2018-05-30 NOTE — Telephone Encounter (Signed)
F/U call abdominal pain.-Today pt is doing well. Pain resolved. Took tramadol last night and it helped and he slept well.  Two times his fingers became sensitive described as a "funny feeling" then subsided. Oxaliplatin precautions reviewed with pt.  Instructed pt to take decadron as prescribed and compazine in between as prescribed.Marland Kitchen

## 2018-05-30 NOTE — Telephone Encounter (Signed)
05/29/18-pt called on call service-He received oxaliplatin,leucovorin and 37fu earlier in day and started having "Abdominal pain severe"- asking if he can take tramadol for his abdominal pain . On call MD Provider instructed pt to take tramadol.

## 2018-05-31 ENCOUNTER — Inpatient Hospital Stay: Payer: Medicare Other

## 2018-05-31 DIAGNOSIS — C165 Malignant neoplasm of lesser curvature of stomach, unspecified: Secondary | ICD-10-CM | POA: Diagnosis not present

## 2018-05-31 DIAGNOSIS — C169 Malignant neoplasm of stomach, unspecified: Secondary | ICD-10-CM

## 2018-05-31 MED ORDER — SODIUM CHLORIDE 0.9% FLUSH
10.0000 mL | INTRAVENOUS | Status: DC | PRN
  Administered 2018-05-31: 10 mL
  Filled 2018-05-31: qty 10

## 2018-05-31 MED ORDER — HEPARIN SOD (PORK) LOCK FLUSH 100 UNIT/ML IV SOLN
500.0000 [IU] | Freq: Once | INTRAVENOUS | Status: AC | PRN
  Administered 2018-05-31: 500 [IU]
  Filled 2018-05-31: qty 5

## 2018-06-12 ENCOUNTER — Inpatient Hospital Stay: Payer: Medicare Other

## 2018-06-12 ENCOUNTER — Encounter: Payer: Self-pay | Admitting: Hematology

## 2018-06-12 ENCOUNTER — Other Ambulatory Visit: Payer: Self-pay | Admitting: Hematology

## 2018-06-12 ENCOUNTER — Telehealth: Payer: Self-pay | Admitting: Hematology

## 2018-06-12 ENCOUNTER — Inpatient Hospital Stay: Payer: Medicare Other | Attending: Hematology

## 2018-06-12 ENCOUNTER — Inpatient Hospital Stay (HOSPITAL_BASED_OUTPATIENT_CLINIC_OR_DEPARTMENT_OTHER): Payer: Medicare Other | Admitting: Hematology

## 2018-06-12 VITALS — BP 105/65 | HR 57 | Temp 97.6°F | Resp 17 | Ht 69.0 in | Wt 202.5 lb

## 2018-06-12 DIAGNOSIS — Z79899 Other long term (current) drug therapy: Secondary | ICD-10-CM | POA: Diagnosis not present

## 2018-06-12 DIAGNOSIS — E46 Unspecified protein-calorie malnutrition: Secondary | ICD-10-CM

## 2018-06-12 DIAGNOSIS — C169 Malignant neoplasm of stomach, unspecified: Secondary | ICD-10-CM

## 2018-06-12 DIAGNOSIS — L270 Generalized skin eruption due to drugs and medicaments taken internally: Secondary | ICD-10-CM

## 2018-06-12 DIAGNOSIS — T451X5A Adverse effect of antineoplastic and immunosuppressive drugs, initial encounter: Secondary | ICD-10-CM | POA: Diagnosis not present

## 2018-06-12 DIAGNOSIS — Z7982 Long term (current) use of aspirin: Secondary | ICD-10-CM | POA: Diagnosis not present

## 2018-06-12 DIAGNOSIS — E876 Hypokalemia: Secondary | ICD-10-CM | POA: Diagnosis not present

## 2018-06-12 DIAGNOSIS — C165 Malignant neoplasm of lesser curvature of stomach, unspecified: Secondary | ICD-10-CM | POA: Insufficient documentation

## 2018-06-12 DIAGNOSIS — D6959 Other secondary thrombocytopenia: Secondary | ICD-10-CM | POA: Insufficient documentation

## 2018-06-12 DIAGNOSIS — L271 Localized skin eruption due to drugs and medicaments taken internally: Secondary | ICD-10-CM | POA: Diagnosis not present

## 2018-06-12 DIAGNOSIS — Z794 Long term (current) use of insulin: Secondary | ICD-10-CM

## 2018-06-12 DIAGNOSIS — R110 Nausea: Secondary | ICD-10-CM

## 2018-06-12 DIAGNOSIS — Z5111 Encounter for antineoplastic chemotherapy: Secondary | ICD-10-CM | POA: Insufficient documentation

## 2018-06-12 DIAGNOSIS — D701 Agranulocytosis secondary to cancer chemotherapy: Secondary | ICD-10-CM | POA: Diagnosis not present

## 2018-06-12 DIAGNOSIS — Z95828 Presence of other vascular implants and grafts: Secondary | ICD-10-CM | POA: Insufficient documentation

## 2018-06-12 DIAGNOSIS — L608 Other nail disorders: Secondary | ICD-10-CM

## 2018-06-12 LAB — CMP (CANCER CENTER ONLY)
ALT: 23 U/L (ref 0–44)
AST: 19 U/L (ref 15–41)
Albumin: 3.2 g/dL — ABNORMAL LOW (ref 3.5–5.0)
Alkaline Phosphatase: 51 U/L (ref 38–126)
Anion gap: 9 (ref 5–15)
BUN: 18 mg/dL (ref 8–23)
CO2: 29 mmol/L (ref 22–32)
Calcium: 8.3 mg/dL — ABNORMAL LOW (ref 8.9–10.3)
Chloride: 101 mmol/L (ref 98–111)
Creatinine: 1.02 mg/dL (ref 0.61–1.24)
GFR, Est AFR Am: >60 mL/min (ref >60)
GFR, Estimated: >60 mL/min (ref >60)
Glucose, Bld: 242 mg/dL — ABNORMAL HIGH (ref 70–99)
Potassium: 4.1 mmol/L (ref 3.5–5.1)
Sodium: 139 mmol/L (ref 135–145)
Total Bilirubin: 0.8 mg/dL (ref 0.3–1.2)
Total Protein: 5.9 g/dL — ABNORMAL LOW (ref 6.5–8.1)

## 2018-06-12 LAB — CBC WITH DIFFERENTIAL (CANCER CENTER ONLY)
Abs Immature Granulocytes: 0.01 10*3/uL (ref 0.00–0.07)
Basophils Absolute: 0 10*3/uL (ref 0.0–0.1)
Basophils Relative: 0 %
Eosinophils Absolute: 0.1 10*3/uL (ref 0.0–0.5)
Eosinophils Relative: 5 %
HCT: 38 % — ABNORMAL LOW (ref 39.0–52.0)
Hemoglobin: 13.1 g/dL (ref 13.0–17.0)
Immature Granulocytes: 0 %
Lymphocytes Relative: 23 %
Lymphs Abs: 0.7 10*3/uL (ref 0.7–4.0)
MCH: 29.8 pg (ref 26.0–34.0)
MCHC: 34.5 g/dL (ref 30.0–36.0)
MCV: 86.4 fL (ref 80.0–100.0)
Monocytes Absolute: 0.3 10*3/uL (ref 0.1–1.0)
Monocytes Relative: 11 %
Neutro Abs: 1.7 10*3/uL (ref 1.7–7.7)
Neutrophils Relative %: 61 %
Platelet Count: 132 10*3/uL — ABNORMAL LOW (ref 150–400)
RBC: 4.4 MIL/uL (ref 4.22–5.81)
RDW: 12.5 % (ref 11.5–15.5)
WBC Count: 2.8 10*3/uL — ABNORMAL LOW (ref 4.0–10.5)
nRBC: 0 % (ref 0.0–0.2)

## 2018-06-12 MED ORDER — DEXTROSE 5 % IV SOLN
Freq: Once | INTRAVENOUS | Status: AC
  Administered 2018-06-12: 11:00:00 via INTRAVENOUS
  Filled 2018-06-12: qty 250

## 2018-06-12 MED ORDER — DEXAMETHASONE SODIUM PHOSPHATE 10 MG/ML IJ SOLN
INTRAMUSCULAR | Status: AC
  Filled 2018-06-12: qty 1

## 2018-06-12 MED ORDER — LEUCOVORIN CALCIUM INJECTION 350 MG
400.0000 mg/m2 | Freq: Once | INTRAVENOUS | Status: AC
  Administered 2018-06-12: 868 mg via INTRAVENOUS
  Filled 2018-06-12: qty 43.4

## 2018-06-12 MED ORDER — SODIUM CHLORIDE 0.9 % IV SOLN
2400.0000 mg/m2 | INTRAVENOUS | Status: DC
  Administered 2018-06-12: 5200 mg via INTRAVENOUS
  Filled 2018-06-12: qty 104

## 2018-06-12 MED ORDER — DEXTROSE 5 % IV SOLN
Freq: Once | INTRAVENOUS | Status: AC
  Administered 2018-06-12: 10:00:00 via INTRAVENOUS
  Filled 2018-06-12: qty 250

## 2018-06-12 MED ORDER — OXALIPLATIN CHEMO INJECTION 100 MG/20ML
85.0000 mg/m2 | Freq: Once | INTRAVENOUS | Status: AC
  Administered 2018-06-12: 185 mg via INTRAVENOUS
  Filled 2018-06-12: qty 37

## 2018-06-12 MED ORDER — FLUOROURACIL CHEMO INJECTION 2.5 GM/50ML
400.0000 mg/m2 | Freq: Once | INTRAVENOUS | Status: AC
  Administered 2018-06-12: 850 mg via INTRAVENOUS
  Filled 2018-06-12: qty 17

## 2018-06-12 MED ORDER — PALONOSETRON HCL INJECTION 0.25 MG/5ML
0.2500 mg | Freq: Once | INTRAVENOUS | Status: AC
  Administered 2018-06-12: 0.25 mg via INTRAVENOUS

## 2018-06-12 MED ORDER — SODIUM CHLORIDE 0.9% FLUSH
10.0000 mL | Freq: Once | INTRAVENOUS | Status: AC
  Administered 2018-06-12: 10 mL
  Filled 2018-06-12: qty 10

## 2018-06-12 MED ORDER — DEXAMETHASONE SODIUM PHOSPHATE 10 MG/ML IJ SOLN
10.0000 mg | Freq: Once | INTRAMUSCULAR | Status: AC
  Administered 2018-06-12: 10 mg via INTRAVENOUS

## 2018-06-12 MED ORDER — PALONOSETRON HCL INJECTION 0.25 MG/5ML
INTRAVENOUS | Status: AC
  Filled 2018-06-12: qty 5

## 2018-06-12 NOTE — Progress Notes (Signed)
[place] OFFICE PROGRESS NOTE  Patient Care Team: Eulas Post, MD as PCP - General  HEME/ONC OVERVIEW: 1. 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 -03/2018: presented to GI for evaluation of persistent epigastric pain; EGD showed an infiltrating and ulcerated, non-circumferential, friable mass involving the lesser curvature of the stomach and the cardia measuring approximately 4 x 2 cm, one superficial distal esophageal ulcer thought to be in the extension of the ulcerated gastric mass; no stigmata of bleeding; biopsy of GEJ ulcer showed poorly differentiated adenocarcinoma, favoring gastric primary; CT CAP showed known GEJ/gastric cardia adenocarcinoma w/ small adjacent LN's, concerning for nodal mets, no distant mets identified  -05/2018: PET showed FDG-avid gastric cardia mass extending slightly into the distal esophagus (SUV 11.2), two small adjacent R gastric LN's with borderline FDG avidity; no evidence of metastasis; LN not amendable for biopsy by EUS per GI  -Late 05/2018 - present: peri-operative FOLFOX   2. 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 Barry Dienes to try to expedite the appt  -PRN medications:  Zofran, Compazine, Ativan  Chemotherapy-associated leukopenia -Secondary to FOLFOX  -WBC 2.8k w/ ANC 1700; down from WBC 7.6k two weeks ago -Patient denies any symptoms of infection -We will monitor for now; no dose adjustment indicated  Chemotherapy-associated number cytopenia -Secondary to FOLFOX -Platelets 132k today, down from 220k two weeks ago -Patient denies any symptoms of bleeding -We will monitor for now; no dose adjustment needed  Chemotherapy-associated nausea -Relatively controlled PRN Zofran -Continue PRN antiemetics as outlined above  Protein malnutrition -Patient has lost 10 pounds since starting FOLFOX 2 weeks ago -I encouraged patient to increase his nutritional supplements (Glucerna 3-4 times per day) due to his decreased p.o. intake -I also have referred patient to nutrition for further evaluation  Faint erythema over right cheek and hands -Secondary to 5-FU -No skin desquamation or mucositis -I recommend OTC topical urea crea   Right toenail erythema -Patient has history of chronic toenail infections -On exam, the nailbed of second digit in the right foot had mild erythema and discomfort with palpation, but there was no drainage or skin breakdown -I encouraged the patient to use a clean gauze to prevent further irritation -I have also referred patient to podiatry for further evaluation -In the absence of infection, I will hold off empiric antibiotics  Orders Placed This Encounter  Procedures  . Ambulatory referral to Podiatry    Referral Priority:   Routine    Referral Type:   Consultation    Referral Reason:   Specialty Services Required    Requested Specialty:   Podiatry    Number of Visits Requested:   1  . Ambulatory referral to Nutrition and Diabetic E    Referral Priority:   Urgent  Referral Type:   Consultation    Referral Reason:   Specialty Services Required    Number of Visits Requested:   1    All questions were answered. The patient  knows to call the clinic with any problems, questions or concerns. No barriers to learning was detected.  Return on 07/25/2018 for labs and clinic follow-up prior to Cycle 3 of peri-operative FOLFOX (pre-operative portion).  [name], MD 06/12/2018 10:10 AM  CHIEF COMPLAINT: "I am just tired"  INTERVAL HISTORY: Mr. Serpe returns to clinic for follow-up of gastric cancer prior to cycle 2 of preoperative FOLFOX.  Patient reports that he tolerated the cycle 1 of preoperative FOLFOX relatively well except persistent fatigue.  He is still able to do some chores around the house, but the duration of activity has been limited by the fatigue.  He has mild intermittent nausea, which is well controlled with PRN Zofran.  He reports a faint erythematous papular rash over the right cheek and over the dorsal surfaces of the hands, but he denies any tenderness, pruritus, or drainage.  He is not using any moisturizer or lotion.  He has chronic toenail issues, and most recently noticed mild erythema in the second digit in the right foot near the nailbed.  He denies any purulent drainage or difficulty with ambulation.  His appetite has been limited, and he is drinking 1-2 nutritional supplements per day.  He denies any other complaint today.  SUMMARY OF ONCOLOGIC HISTORY:   Gastric adenocarcinoma ([place])   04/22/2018 Procedure    EGD: - An infiltrative and ulcerated, non-circumferential, friable mass with no bleeding and no stigmata of recent bleeding involving the lesser curvature of the stomach and the cardia measuring approximately 4 cm x 2 cm. Biopsies were taken with a cold forceps for histology. Findings: - The exam of the stomach was otherwise normal. - One superficial distal esophageal ulcer which was an extention of the ulcerated gastric mass with no bleeding and no stigmata of recent bleeding was found 41 cm from the incisors at the EGJ . - The exam of the esophagus was otherwise normal. - A small  non-bleeding diverticulum was found in the second portion of the duodenum. - The exam of the duodenum was otherwise normal.    04/22/2018 Pathology Results    Accession: [id number]  Diagnosis Surgical [P], GE junction-ulcerated lesion - POORLY DIFFERENTIATED ADENOCARCINOMA. - SEE COMMENT. Microscopic Comment The features slightly favor a gastric primary rather than an esophageal primary    04/29/2018 Imaging    CT CAP: IMPRESSION: 1. Irregular wall thickening of the proximal stomach extending from the gastroesophageal junction into the gastric cardia suspicious for infiltrative neoplasm given provided clinical history. Adjacent prominent lymph nodes, possibly small nodal metastases. No distant metastases identified. 2. No suspicious hepatic findings. 3. Posttraumatic right rib findings with probable incidental lesion involving the posterior aspect of the right 4th rib. No suspicious pulmonary nodules. 4. Evidence of prior granulomatous disease with calcified hilar and upper abdominal lymph nodes and calcified splenic granulomas. 5.  Aortic Atherosclerosis (ICD10-I70.0).    05/21/2018 Imaging    PET: IMPRESSION: 1. Hypermetabolic mass primarily in the gastric cardia region extending slightly into the distal esophagus, maximum SUV 11.2, compatible with malignancy. Two small adjacent right gastric lymph nodes are indistinct and not definitively hypermetabolic but could possibly be involved. No findings of metastatic disease to the liver or elsewhere. 2. Prominent physiologic activity in small and large bowel, without CT correlate. 3. No hypermetabolic rib  lesion. 4. Other imaging findings of potential clinical significance: Mild thyroid goiter. Aortic Atherosclerosis (ICD10-I70.0). Mild-to-moderate cardiomegaly. Coronary atherosclerosis. Atrophic right infraspinatus muscle. Pleural calcifications, left greater than right. Prominent stool throughout the colon  favors constipation. Lower lumbar spondylosis. Old granulomatous disease    05/29/2018 -  Chemotherapy    The patient had palonosetron (ALOXI) injection 0.25 mg, 0.25 mg, Intravenous,  Once, 1 of 6 cycles Administration: 0.25 mg (05/29/2018) leucovorin 868 mg in dextrose 5 % 250 mL infusion, 400 mg/m2 = 868 mg, Intravenous,  Once, 1 of 6 cycles Administration: 868 mg (05/29/2018) oxaliplatin (ELOXATIN) 185 mg in dextrose 5 % 500 mL chemo infusion, 85 mg/m2 = 185 mg, Intravenous,  Once, 1 of 6 cycles Administration: 185 mg (05/29/2018) fluorouracil (ADRUCIL) chemo injection 850 mg, 400 mg/m2 = 850 mg, Intravenous,  Once, 1 of 6 cycles Administration: 850 mg (05/29/2018) fluorouracil (ADRUCIL) 5,200 mg in sodium chloride 0.9 % 146 mL chemo infusion, 2,400 mg/m2 = 5,200 mg, Intravenous, 1 Day/Dose, 1 of 6 cycles Administration: 5,200 mg (05/29/2018)  for chemotherapy treatment.      REVIEW OF SYSTEMS:   Constitutional: ( - ) fevers, ( - )  chills , ( - ) night sweats Eyes: ( - ) blurriness of vision, ( - ) double vision, ( - ) watery eyes Ears, nose, mouth, throat, and face: ( - ) mucositis, ( - ) sore throat Respiratory: ( - ) cough, ( - ) dyspnea, ( - ) wheezes Cardiovascular: ( - ) palpitation, ( - ) chest discomfort, ( - ) lower extremity swelling Gastrointestinal:  ( + ) nausea, ( - ) heartburn, ( - ) change in bowel habits Skin: ( + ) abnormal skin rashes Lymphatics: ( - ) new lymphadenopathy, ( - ) easy bruising Neurological: ( - ) numbness, ( - ) tingling, ( - ) new weaknesses Behavioral/Psych: ( - ) mood change, ( - ) new changes  All other systems were reviewed with the patient and are negative.  I have reviewed the past medical history, past surgical history, social history and family history with the patient and they are unchanged from previous note.  ALLERGIES:  is allergic to naproxen sodium.  MEDICATIONS:  Current Outpatient Medications  Medication Sig Dispense Refill  .  amLODipine (NORVASC) 5 MG tablet TAKE 1 TABLET DAILY (PLEASE SCHEDULE A PHYSICAL FOR MORE REFILLS) 90 tablet 1  . Ascorbic Acid (VITAMIN C) 100 MG tablet Take 100 mg by mouth daily.    Marland Kitchen aspirin 81 MG tablet Take 81 mg by mouth daily.    . calcium carbonate (TUMS - DOSED IN MG ELEMENTAL CALCIUM) 500 MG chewable tablet Chew 1 tablet by mouth daily.    . Cholecalciferol (VITAMIN D) 2000 UNITS CAPS Take 5,000 Units by mouth daily.     Marland Kitchen ezetimibe (ZETIA) 10 MG tablet TAKE 1 TABLET DAILY 90 tablet 2  . furosemide (LASIX) 20 MG tablet TAKE 1 TABLET TWICE A DAY 180 tablet 3  . Horse Chestnut 300 MG CAPS Take 300 mg by mouth 2 (two) times daily.    Marland Kitchen HUMALOG 100 UNIT/ML injection INJECT 15 TO 18 UNITS UNDER THE SKIN THREE TIMES A DAY BEFORE MEALS 50 mL 2  . Insulin Syringe-Needle U-100 (B-D INS SYR HALF-UNIT .3CC/31G) 31G X 5/16" 0.3 ML MISC by Does not apply route.    . Insulin Syringe-Needle U-100 31G X 5/16" 1 ML MISC Use once daily 100 each 3  . LANTUS 100 UNIT/ML injection INJECT 46 UNITS  UNDER THE SKIN AT BEDTIME 50 mL 5  . lidocaine-prilocaine (EMLA) cream Apply to affected area once 30 g 3  . lisinopril (PRINIVIL,ZESTRIL) 20 MG tablet Take 10 mg by mouth daily.    Marland Kitchen LORazepam (ATIVAN) 0.5 MG tablet Take 1 tablet (0.5 mg total) by mouth every 6 (six) hours as needed (Nausea or vomiting). 30 tablet 0  . metFORMIN (GLUCOPHAGE) 1000 MG tablet TAKE 1 TABLET TWICE A DAY WITH MEALS 180 tablet 4  . metoprolol tartrate (LOPRESSOR) 50 MG tablet TAKE ONE AND ONE-HALF TABLETS TWICE A DAY (SCHEDULE A PHYSICAL FOR MORE REFILLS) 270 tablet 4  . prochlorperazine (COMPAZINE) 10 MG tablet Take 1 tablet (10 mg total) by mouth every 6 (six) hours as needed (Nausea or vomiting). 30 tablet 1  . rosuvastatin (CRESTOR) 20 MG tablet TAKE 1 TABLET DAILY (PLEASE SCHEDULE A PHYSICAL FOR MORE REFILLS) 90 tablet 3  . traMADol (ULTRAM) 50 MG tablet Take 50 mg by mouth 3 (three) times daily as needed.    . vitamin E (VITAMIN  E) 400 UNIT capsule Takes one tablet by mouth daily. (Patient taking differently: Take 400 Units by mouth daily. Takes one tablet by mouth daily.)    . dexamethasone (DECADRON) 4 MG tablet Take 2 tablets (8 mg total) by mouth daily. Start the day after chemotherapy for 2 days. Take with food. (Patient not taking: Reported on 06/12/2018) 30 tablet 1   No current facility-administered medications for this visit.     PHYSICAL EXAMINATION: ECOG PERFORMANCE STATUS: 1 - Symptomatic but completely ambulatory  Today's Vitals   06/12/18 0930 06/12/18 0935  BP: 105/65   Pulse: (!) 57   Resp: 17   Temp: 97.6 F (36.4 C)   TempSrc: Oral   SpO2: 99%   Weight: 202 lb 8 oz (91.9 kg)   Height: 5\' 9"  (1.753 m)   PainSc:  0-No pain   Body mass index is 29.9 kg/m.  Filed Weights   06/12/18 0930  Weight: 202 lb 8 oz (91.9 kg)    GENERAL: alert, no distress and comfortable SKIN: faint erythematous papules over the right cheek and bilateral dorsal surfaces of the hands, no skin desquamation or mucositis EYES: conjunctiva are pink and non-injected, sclera clear OROPHARYNX: no exudate, no erythema; lips, buccal mucosa, and tongue normal  NECK: supple, non-tendery  LUNGS: clear to auscultation with normal breathing effort HEART: regular rate & rhythm and no murmurs and no lower extremity edema ABDOMEN: soft, non-tender, non-distended, normal bowel sounds Musculoskeletal: no cyanosis of digits and no clubbing  PSYCH: alert & oriented x 3, fluent speech NEURO: no focal motor/sensory deficits  LABORATORY DATA:  I have reviewed the data as listed    Component Value Date/Time   NA 139 06/12/2018 0853   K 4.1 06/12/2018 0853   CL 101 06/12/2018 0853   CO2 29 06/12/2018 0853   GLUCOSE 242 (H) 06/12/2018 0853   BUN 18 06/12/2018 0853   CREATININE 1.02 06/12/2018 0853   CALCIUM 8.3 (L) 06/12/2018 0853   PROT 5.9 (L) 06/12/2018 0853   ALBUMIN 3.2 (L) 06/12/2018 0853   AST 19 06/12/2018 0853   ALT  23 06/12/2018 0853   ALKPHOS 51 06/12/2018 0853   BILITOT 0.8 06/12/2018 0853   GFRNONAA >60 06/12/2018 0853   GFRAA >60 06/12/2018 0853    No results found for: SPEP, UPEP  Lab Results  Component Value Date   WBC 2.8 (L) 06/12/2018   NEUTROABS 1.7 06/12/2018   HGB 13.1  06/12/2018   HCT 38.0 (L) 06/12/2018   MCV 86.4 06/12/2018   PLT 132 (L) 06/12/2018      Chemistry      Component Value Date/Time   NA 139 06/12/2018 0853   K 4.1 06/12/2018 0853   CL 101 06/12/2018 0853   CO2 29 06/12/2018 0853   BUN 18 06/12/2018 0853   CREATININE 1.02 06/12/2018 0853      Component Value Date/Time   CALCIUM 8.3 (L) 06/12/2018 0853   ALKPHOS 51 06/12/2018 0853   AST 19 06/12/2018 0853   ALT 23 06/12/2018 0853   BILITOT 0.8 06/12/2018 0853       RADIOGRAPHIC STUDIES: I have personally reviewed the radiological images as listed below and agreed with the findings in the report. Nm Pet Image Initial (pi) Skull Base To Thigh  Result Date: 05/21/2018 CLINICAL DATA:  Initial treatment strategy for gastric cancer. EXAM: NUCLEAR MEDICINE PET SKULL BASE TO THIGH TECHNIQUE: 10.6 mCi F-18 FDG was injected intravenously. Full-ring PET imaging was performed from the skull base to thigh after the radiotracer. CT data was obtained and used for attenuation correction and anatomic localization. Fasting blood glucose: 73 mg/dl COMPARISON:  04/29/2018 FINDINGS: Mediastinal blood pool activity: SUV max 2.7 NECK: Enlarged left thyroid lobe with small calcifications and hypodensities but no accentuated metabolic activity to suggest a significant nodule. Incidental CT findings: Left common carotid atherosclerotic calcification. CHEST: The hypermetabolic activity in the stomach extends slightly into the distal esophagus, with esophageal portion having a maximum SUV of 7.6. Incidental CT findings: Right Port-A-Cath tip: SVC. Coronary, aortic arch, and branch vessel atherosclerotic vascular disease. Mild-to-moderate  cardiomegaly. Prior median sternotomy. Mild bilateral gynecomastia. Atrophic right infraspinatus muscle. Pleural thickening and pleural calcification on the left side. There also some calcified pleural plaques on the right side. ABDOMEN/PELVIS: Wall thickening and accentuated activity in the gastric fundus extending into the esophagus, maximum SUV in this vicinity 11.2. There is unusually high activity in multiple loops of small and large bowel, without CT correlate, compatible with considerable accentuated physiologic activity. Scattered lymph nodes in the mesentery are not appreciably hypermetabolic. 8 mm right gastric node on image 104/4, questionable accentuated metabolic activity. Adjacent vascular structures noted. Descending duodenal diverticulum. Incidental CT findings: Prominent stool throughout the colon favors constipation. Redundant sigmoid colon extending into the right upper quadrant. Calcifications in the spleen compatible with old granulomatous disease. Aortoiliac atherosclerotic vascular disease. Small pelvic lymph nodes are not pathologically enlarged by size criteria. Right scrotal hydrocele. SKELETON: No significant abnormal hypermetabolic activity in this region. Incidental CT findings: Nonunited right seventh and eighth rib fractures. Grade 1 degenerative anterolisthesis at L5-S1. Lower lumbar spondylosis. IMPRESSION: 1. Hypermetabolic mass primarily in the gastric cardia region extending slightly into the distal esophagus, maximum SUV 11.2, compatible with malignancy. Two small adjacent right gastric lymph nodes are indistinct and not definitively hypermetabolic but could possibly be involved. No findings of metastatic disease to the liver or elsewhere. 2. Prominent physiologic activity in small and large bowel, without CT correlate. 3. No hypermetabolic rib lesion. 4. Other imaging findings of potential clinical significance: Mild thyroid goiter. Aortic Atherosclerosis (ICD10-I70.0).  Mild-to-moderate cardiomegaly. Coronary atherosclerosis. Atrophic right infraspinatus muscle. Pleural calcifications, left greater than right. Prominent stool throughout the colon favors constipation. Lower lumbar spondylosis. Old granulomatous disease. Electronically Signed   By: Van Clines M.D.   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 Port-A-Cath needed for treatment.  EXAM: FLUOROSCOPIC AND ULTRASOUND GUIDED PLACEMENT OF A SUBCUTANEOUS PORT COMPARISON:  None. MEDICATIONS: Ancef 2 g; The antibiotic was administered within an appropriate time interval prior to skin puncture. ANESTHESIA/SEDATION: Versed 3.0 mg IV; Fentanyl 200 mcg IV; Moderate Sedation Time:  37 minutes The patient was continuously monitored during the procedure by the interventional radiology nurse under my direct supervision. FLUOROSCOPY TIME:  30 seconds, 8 mGy COMPLICATIONS: None immediate. PROCEDURE: The procedure, risks, benefits, and alternatives were explained to the patient. Questions regarding the procedure were encouraged and answered. The patient understands and consents to the procedure. Patient was placed supine on the interventional table. Ultrasound confirmed a patent right internal jugular vein. The right chest and neck were cleaned with a skin antiseptic and a sterile drape was placed. Maximal barrier sterile technique was utilized including caps, mask, sterile gowns, sterile gloves, sterile drape, hand hygiene and skin antiseptic. The right neck was anesthetized with 1% lidocaine. Small incision was made in the right neck with a blade. Micropuncture set was placed in the right internal jugular vein with ultrasound guidance. The micropuncture wire was used for measurement purposes. The right chest was anesthetized with 1% lidocaine with epinephrine. #15 blade was used to make an incision and a subcutaneous port pocket was  formed. [place] was assembled. Subcutaneous tunnel was formed with a stiff tunneling device. The port catheter was brought through the subcutaneous tunnel. The port was placed in the subcutaneous pocket. The micropuncture set was exchanged for a peel-away sheath. The catheter was placed through the peel-away sheath and the tip was positioned at the SVC and right atrium junction. Catheter placement was confirmed with fluoroscopy. The port was accessed and flushed with heparinized saline. The port pocket was closed using two layers of absorbable sutures and Dermabond. The vein skin site was closed using a single layer of absorbable suture and Dermabond. Sterile dressings were applied. Patient tolerated the procedure well without an immediate complication. Ultrasound and fluoroscopic images were taken and saved for this procedure. IMPRESSION: Placement of a subcutaneous CT injectable port device. Electronically Signed   By: Markus Daft M.D.   On: 05/15/2018 15:03

## 2018-06-12 NOTE — Patient Instructions (Addendum)
[facility] Discharge Instructions for Patients Receiving Chemotherapy  Today you received the following chemotherapy agents: Oxaliplatin (Eloxatin), Leucovorin, Fluorouracil (Adrucil, 5-FU)  To help prevent nausea and vomiting after your treatment, we encourage you to take your nausea medication as directed.    If you develop nausea and vomiting that is not controlled by your nausea medication, call the clinic.   BELOW ARE SYMPTOMS THAT SHOULD BE REPORTED IMMEDIATELY:  *FEVER GREATER THAN 100.5 F  *CHILLS WITH OR WITHOUT FEVER  NAUSEA AND VOMITING THAT IS NOT CONTROLLED WITH YOUR NAUSEA MEDICATION  *UNUSUAL SHORTNESS OF BREATH  *UNUSUAL BRUISING OR BLEEDING  TENDERNESS IN MOUTH AND THROAT WITH OR WITHOUT PRESENCE OF ULCERS  *URINARY PROBLEMS  *BOWEL PROBLEMS  UNUSUAL RASH Items with * indicate a potential emergency and should be followed up as soon as possible.  Feel free to call the clinic should you have any questions or concerns. The clinic phone number is [phone].  Please show the CHEMO ALERT CARD at check-in to the Emergency Department and triage nurse.  [facility] Discharge Instructions for Patients receiving Home Portable Chemo Pump   **The bag should finish at 46 hours, 96 hours or 7 days. For example, if your pump is scheduled for 46 hours and it was put on at 4pm, it should finish at 2 pm the day it is scheduled to come off regardless of your appointment time.    Estimated time to finish   _________________________ (Have your nurse fill in)     ** if the display on your pump reads "Low Volume" and it is beeping, take the batteries out of the pump and come to the cancer center for it to be taken off.   **If the pump alarms go off prior to the pump reading "Low Volume" then call the [phone] and someone can assist you.  **If the plunger comes out and the bag fluid is running out, please use your chemo spill kit to clean  up the spill. Do not use paper towels or other house hold products.  ** If you have problems or questions regarding your pump, please call either the [phone] or the cancer center Monday-Friday 8:00am-4:30pm at [phone] and we will assist you.  If you are unable to get assistance then go to [facility] Emergency Room, ask the staff to contact the IV team for assistance.    

## 2018-06-12 NOTE — Telephone Encounter (Signed)
Scheduled appt per 2/12 sch message - pt is aware of appt date and time   

## 2018-06-13 ENCOUNTER — Telehealth: Payer: Self-pay

## 2018-06-13 NOTE — Telephone Encounter (Signed)
No change in appts . Per 2/12 los

## 2018-06-14 ENCOUNTER — Inpatient Hospital Stay: Payer: Medicare Other

## 2018-06-14 VITALS — BP 145/65 | HR 58 | Temp 98.0°F | Resp 16

## 2018-06-14 DIAGNOSIS — C169 Malignant neoplasm of stomach, unspecified: Secondary | ICD-10-CM

## 2018-06-14 DIAGNOSIS — Z5111 Encounter for antineoplastic chemotherapy: Secondary | ICD-10-CM | POA: Diagnosis not present

## 2018-06-14 MED ORDER — HEPARIN SOD (PORK) LOCK FLUSH 100 UNIT/ML IV SOLN
500.0000 [IU] | Freq: Once | INTRAVENOUS | Status: AC | PRN
  Administered 2018-06-14: 500 [IU]
  Filled 2018-06-14: qty 5

## 2018-06-14 MED ORDER — SODIUM CHLORIDE 0.9% FLUSH
10.0000 mL | INTRAVENOUS | Status: DC | PRN
  Administered 2018-06-14: 10 mL
  Filled 2018-06-14: qty 10

## 2018-06-17 ENCOUNTER — Inpatient Hospital Stay: Payer: Medicare Other | Admitting: Nutrition

## 2018-06-17 NOTE — Progress Notes (Signed)
78 year old male diagnosed with gastric cancer he is a patient of Dr. Maylon Peppers.  Past medical history includes hypertension, hyperlipidemia, GERD, diabetes type 2, and CAD.  Medications include vitamin C, vitamin D, Lasix, Humalog, Lantus, Ativan, Glucophage, Crestor, and vitamin E.  Labs include glucose 242 and albumin 3.2 on February 12.  Height: 5 feet 9 inches. Weight: 202.5 pounds February 12. Usual body weight: 229.4 pounds December 2019. BMI: 29.9.  Patient reports he is struggling to eat food.  He has no motivation to increase solid food intake. He does experience cold sensitivity, especially in his fingertips. He reports he has constipation but has started taking medication. Reports he drinks 2-3 protein shakes a day made with a protein powder. I am unable to estimate patient's calorie intake based on dietary recall. He is quite fatigued and is not moving much. Patient does not want to complete nutrition focused physical exam. 12% weight loss is significant over 2 months.  Patient likely has some degree of malnutrition.  Nutrition diagnosis: Unintended weight loss related to poor oral intake as evidenced by 12% weight loss over 2 months.  Intervention: Patient was educated to increase oral nutrition supplements.  I recommended 6 Ensure Enlive daily, 1 every 3 hours or so.  Patient was able to sample this in my office and enjoyed it. Encouraged him to contact his physician to adjust insulin as needed to control blood sugars. Reviewed high-protein foods and ways to add calories. Fact sheet was provided.  Samples were given.  Contact information was provided.  Monitoring, evaluation, goals: Patient will work to increase overall intake to minimize further weight loss.  Next visit: Will schedule with upcoming treatment.  **Disclaimer: This note was dictated with voice recognition software. Similar sounding words can inadvertently be transcribed and this note may contain  transcription errors which may not have been corrected upon publication of note.**

## 2018-06-19 ENCOUNTER — Ambulatory Visit (INDEPENDENT_AMBULATORY_CARE_PROVIDER_SITE_OTHER): Payer: Medicare Other | Admitting: Podiatry

## 2018-06-19 ENCOUNTER — Encounter: Payer: Self-pay | Admitting: Podiatry

## 2018-06-19 ENCOUNTER — Other Ambulatory Visit: Payer: Self-pay | Admitting: Hematology

## 2018-06-19 ENCOUNTER — Telehealth: Payer: Self-pay | Admitting: *Deleted

## 2018-06-19 VITALS — BP 130/65 | HR 62

## 2018-06-19 DIAGNOSIS — M79675 Pain in left toe(s): Secondary | ICD-10-CM

## 2018-06-19 DIAGNOSIS — M79674 Pain in right toe(s): Secondary | ICD-10-CM | POA: Diagnosis not present

## 2018-06-19 DIAGNOSIS — B351 Tinea unguium: Secondary | ICD-10-CM

## 2018-06-19 DIAGNOSIS — L600 Ingrowing nail: Secondary | ICD-10-CM

## 2018-06-19 DIAGNOSIS — S91115A Laceration without foreign body of left lesser toe(s) without damage to nail, initial encounter: Secondary | ICD-10-CM

## 2018-06-19 DIAGNOSIS — R3911 Hesitancy of micturition: Secondary | ICD-10-CM

## 2018-06-19 DIAGNOSIS — E1142 Type 2 diabetes mellitus with diabetic polyneuropathy: Secondary | ICD-10-CM

## 2018-06-19 MED ORDER — TAMSULOSIN HCL 0.4 MG PO CAPS: 0.4000 mg | ORAL_CAPSULE | Freq: Every day | ORAL | 2 refills | Status: DC

## 2018-06-19 MED ORDER — MUPIROCIN 2 % EX OINT: 1.0000 "application " | TOPICAL_OINTMENT | Freq: Two times a day (BID) | CUTANEOUS | 0 refills | Status: DC

## 2018-06-19 NOTE — Patient Instructions (Signed)

## 2018-06-19 NOTE — Telephone Encounter (Signed)
TCT patient's home. Patient is sleeping , so I spoke with pt's wife, Tristan Hughes. Reviewed new medication with her (Flomax). Advised her to be aware of possibility for dizzyness and for pt to change positions slowly, keep drinking fluids and to follow up with his PCP.  Tristan Hughes voiced understanding.  No further questions or concerns.

## 2018-06-19 NOTE — Progress Notes (Unsigned)
floma

## 2018-06-19 NOTE — Progress Notes (Addendum)
Subjective: Tristan Hughes presents with history of diabetes greater than 10 years.  He is referred by Eulas Post, MD with cc of painful, discolored, thick toenails 1 through 5 bilaterally which interfere with daily activities.  Pain is aggravated when wearing enclosed shoe gear.  Dr. Carolann Littler is his primary care doctor.  Last visit was May 20, 2018.  Patient states he did attempt to trim his toenails which resulted in injury to his left second digit.  Patient relates he is currently undergoing chemotherapy for stomach cancer.  He has 1 chemotherapy treatment scheduled for next week and if his tumor has stopped growing he will have surgery to remove the tumor.  Post surgery he is scheduled for 3 additional chemotherapy treatments.  He relates his blood sugars have been uncontrolled since starting chemotherapy and he is taking insulin on sliding scale.  He relates no numbness, tingling, or burning in his feet.  He does have tingling in his hands.  He does relate fatigue and exhaustion due to taking chemotherapy.  Past Medical History:  Diagnosis Date  . CAD 07/09/2009   Cath 12-02-10 90% left main, 90%LAD, 99% circumflex, 80% RCA  . Cancer ([place])    Stomach and esophagus  . Cataract    basal cell CA- left ear  . COLONIC POLYPS, HX OF 07/09/2009   Qualifier: Diagnosis of  By: Valma Cava LPN, [name]    . DIAB W/O COMP TYPE II/UNS NOT STATED UNCNTRL 07/09/2009   Qualifier: Diagnosis of  By: Joyce Gross    . GERD (gastroesophageal reflux disease)   . History of chicken pox   . HYPERLIPIDEMIA 07/09/2009   Qualifier: Diagnosis of  By: Valma Cava LPN, [name]    . HYPERTENSION 07/09/2009   Qualifier: Diagnosis of  By: Valma Cava LPN, [name]    . OBSTRUCTIVE SLEEP APNEA 07/09/2009   Qualifier: Diagnosis of  By: Joyce Gross    . Sleep apnea    no CPAP     Patient Active Problem List   Diagnosis Date Noted  . Port-A-Cath in place 06/12/2018  . Leukopenia due to antineoplastic  chemotherapy ([place]) 06/12/2018  . Chemotherapy-induced thrombocytopenia 06/12/2018  . Protein malnutrition ([place]) 06/12/2018  . Drug rash 06/12/2018  . Chemotherapy-induced nausea 05/22/2018  . 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 Abdominal pain 05/08/2018  . Obesity (BMI 30-39.9) 10/16/2013  . Type 2 diabetes mellitus, controlled ([place]) 07/09/2009  . Hyperlipidemia 07/09/2009  . OBSTRUCTIVE SLEEP APNEA 07/09/2009  . Essential hypertension 07/09/2009  . Coronary atherosclerosis 07/09/2009  . COLONIC POLYPS, HX OF 07/09/2009     Past Surgical History:  Procedure Laterality Date  . BIOPSY BREAST    . CHOLECYSTECTOMY    . COLONOSCOPY    . CORONARY ANGIOPLASTY WITH STENT PLACEMENT    . CORONARY ARTERY BYPASS GRAFT  8/12   CABG X 4 LIMA-LAD, SVG-PDB, SVG-OM/RAMUS   /  . IR IMAGING GUIDED PORT INSERTION  05/15/2018  . TONSILLECTOMY      Medications    amLODipine (NORVASC) 5 MG tablet    Ascorbic Acid (VITAMIN C) 100 MG tablet    aspirin 81 MG tablet    calcium carbonate (TUMS - DOSED IN MG ELEMENTAL CALCIUM) 500 MG chewable tablet    Cholecalciferol (VITAMIN D) 2000 UNITS CAPS    dexamethasone (DECADRON) 4 MG tablet    ezetimibe (ZETIA) 10 MG tablet    furosemide (LASIX) 20 MG tablet    [place] 300 MG CAPS    HUMALOG 100 UNIT/ML  injection    Insulin Syringe-Needle U-100 (B-D INS SYR HALF-UNIT .3CC/31G) 31G X 5/16" 0.3 ML MISC    Insulin Syringe-Needle U-100 31G X 5/16" 1 ML MISC    LANTUS 100 UNIT/ML injection    lidocaine-prilocaine (EMLA) cream    lisinopril (PRINIVIL,ZESTRIL) 20 MG tablet    LORazepam (ATIVAN) 0.5 MG tablet    metFORMIN (GLUCOPHAGE) 1000 MG tablet    metoprolol tartrate (LOPRESSOR) 50 MG tablet     prochlorperazine (COMPAZINE) 10 MG tablet    rosuvastatin (CRESTOR) 20 MG tablet    traMADol (ULTRAM) 50 MG tablet    vitamin E (VITAMIN E) 400 UNIT capsule     Allergies  Allergen Reactions  . Naproxen Sodium     REACTION: swelling      Social History   Occupational History  . Not on file  Tobacco Use  . Smoking status: Never Smoker  . Smokeless tobacco: Never Used  Substance and Sexual Activity  . Alcohol use: No  . Drug use: No  . Sexual activity: Not on file     Family History  Problem Relation Age of Onset  . Hypertension Mother   . Heart disease Mother   . Stroke Mother   . Diabetes Mother   . Cancer Mother        pancreatic  . Pancreatic cancer Mother   . Heart disease Father   . Hypertension Father   . Colon cancer Maternal Uncle   . Esophageal cancer Neg Hx   . Stomach cancer Neg Hx   . Rectal cancer Neg Hx      Immunization History  Administered Date(s) Administered  . Hepatitis A 08/03/2009  . Hepatitis B 08/31/2009, 09/30/2009  . Influenza,inj,Quad PF,6+ Mos 05/08/2018  . Pneumococcal Conjugate-13 04/17/2013  . Pneumococcal Polysaccharide-23 04/14/2016  . Td 08/03/2009     Review of systems: Positive Findings in bold print.  Constitutional:  chills, fatigue, fever, sweats, weight change Communication: Optometrist, sign Ecologist, hand writing, iPad/Android device Head: headaches, head injury Eyes: changes in vision, eye pain, glaucoma, cataracts, macular degeneration, diplopia, glare,  light sensitivity, eyeglasses or contacts, blindness Ears nose mouth throat: Hard of hearing, ringing in ears, deaf, sign language,  vertigo,   nosebleeds,  rhinitis,  cold sores, snoring, swollen glands Cardiovascular: HTN, edema, arrhythmia, pacemaker in place, defibrillator in place,  chest pain/tightness, chronic anticoagulation, blood clot, heart failure Peripheral Vascular: leg cramps, varicose veins, blood clots, lymphedema Respiratory:  difficulty breathing, denies congestion, SOB, wheezing, cough, emphysema Gastrointestinal: change in appetite or weight, abdominal pain, constipation, diarrhea, nausea, vomiting, vomiting blood, change in bowel habits, abdominal pain, jaundice, rectal  bleeding, hemorrhoids, Genitourinary:  nocturia,  pain on urination,  blood in urine, Foley catheter, urinary urgency Musculoskeletal: uses mobility aid,  cramping, stiff joints, painful joints, decreased joint motion, fractures, OA, gout Skin: +changes in toenails, color change, dryness, itching, mole changes,  rash  Neurological: headaches, numbness in feet, paresthesias in feet, burning in feet, fainting,  seizures, change in speech. denies headaches, memory problems/poor historian, cerebral palsy, weakness, paralysis, numbness in hands, burning in hands, tingling in hands Endocrine: diabetes, hypothyroidism, hyperthyroidism,  goiter, dry mouth, flushing, heat intolerance,  cold intolerance,  excessive thirst, denies polyuria,  nocturia Hematological:  easy bleeding, excessive bleeding, easy bruising, enlarged lymph nodes, on long term blood thinner, history of past transusions Allergy/immunological:  hives, eczema, frequent infections, multiple drug allergies, seasonal allergies, transplant recipient, currently undergoing chemotherapy Psychiatric:  anxiety, depression, mood disorder, suicidal ideations, hallucinations  Objective: Vitals:   06/19/18 1014  BP: 130/65  Pulse: 62   Vascular Examination: Capillary refill time immediate x 10 digits  Dorsalis pedis 1/4 bilaterally   Posterior tibial pulses present b/l 2/4 bilaterally  No digital hair x 10 digits  Skin temperature gradient WNL b/l  Dermatological Examination: Skin with normal turgor, texture and tone b/l  Toenails 1-5 b/l discolored, thick, dystrophic with subungual debris and pain with palpation to nailbeds due to thickness of nails.  Bilateral great toes are noted to be incurvated right great toe lateral border and left great toe medial border.  There is tenderness to palpation of the right great toe with early nail border hypertrophy.  There is no erythema, no edema, no drainage, no flocculence noted.  There is  evidence of prior attempted cutting of the left second toenail.  There is a healing laceration noted near the proximal border.  There is no edema,  no erythema,  no purulence and no drainage noted.  Musculoskeletal: Muscle strength 5/5 to all LE muscle groups  Hallux abductovalgus with bunion deformity bilaterally  Hammertoe deformity digits 2 through 5 bilaterally  Neurological: Sensation intact with 10 gram monofilament bilaterally.  Vibratory sensation absent bilaterally when tested with tuning fork.  Assessment: 1. Painful onychomycosis toenails 1-5 b/l  2. Ingrown toenail bilateral great toes, noninfected 3. Laceration left second digit, noninfected 4. NIDDM with peripheral neuropathy 5. Hallux abductovalgus with bunion deformity bilaterally 6. Hammertoe deformity 2 through 5 bilaterally   Plan: 1. Discussed diabetic foot care principles.  Literature dispensed on today. 2. Toenails 1-5 b/l were debrided in length and girth without iatrogenic bleeding.  Offending nail borders to bilateral great toe toes were debrided and curettage without incident.  All borders were cleansed with alcohol.  His wife was instructed she can apply the mupirocin ointment to the bilateral great toes once daily for week. 3. For the healing laceration on the left second digit a prescription for mupirocin ointment was called into the pharmacy.  His wife is instructed to apply the ointment to the left second toe once daily until resolved.  She was educated on signs and symptoms of infection and she is to call the office should the total encounter any increased redness, pain, drainage, swelling. 4. Patient to continue soft, supportive shoe gear.  We did discuss the diabetic shoe program.  Patient last had diabetic shoes approximately 4 years ago.  We will initiate the process for him.  Qualifying diagnoses for diabetic shoes include NIDDM with neuropathy, hallux abductovalgus with bunion deformity bilaterally,  hammertoe deformity 2 through 5 bilaterally. 5. Patient or patient's wife to report any pedal injuries to medical professional immediately. 6. Follow up 9 weeks, with the understanding that his current cancer treatment supersedes any appointments.  They will reschedule when available. 7. Patient/POA to call should there be a concern in the interim.

## 2018-06-19 NOTE — Telephone Encounter (Signed)
If Mr. Sheahan is not having any symptoms of urinary tract infection, such as hematuria, dysuria, or increased urinary frequency, the dribbling is most likely due to BPH. I have sent a prescription for Flomax to the patient's pharmacy. He should be aware of light-headedness, which can be due to low blood pressure. He should also follow up with his primary care physician for further management if his dribbling does not improve with Flomax.  Thank you.  Dr. Maylon Peppers

## 2018-06-19 NOTE — Telephone Encounter (Signed)
"  Tristan Hughes [phone]).  Treatment last Wednesday.  Sunday or Monday I started having trouble urinating.  What do I need to do about this?     Feel urgent need to go.  Rushing to get there only dribbling a very small amount.    No burning, pain, odor, color change or fever.    Drinking two full 12 oz glasses of water daily.    Last BM this morning was soft without straining or pain.  Drinking a tea that's helping resolve constipation earlier this week.    My body tempertarure must have something to do with it.  While taking my shower I peed normally; a good amount freely.   First urination only dribbled urine.  After shower I wasn't able to get much out."

## 2018-06-25 ENCOUNTER — Other Ambulatory Visit: Payer: Self-pay | Admitting: General Surgery

## 2018-06-25 NOTE — Progress Notes (Signed)
[place] OFFICE PROGRESS NOTE  Patient Care Team: Tristan Post, MD as PCP - General  HEME/ONC OVERVIEW: 1. 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 -03/2018: presented to GI for evaluation of persistent epigastric pain; EGD showed an infiltrating and ulcerated, non-circumferential, friable mass involving the lesser curvature of the stomach and the cardia measuring approximately 4 x 2 cm, one superficial distal esophageal ulcer thought to be in the extension of the ulcerated gastric mass; no stigmata of bleeding; biopsy of GEJ ulcer showed poorly differentiated adenocarcinoma, favoring gastric primary; CT CAP showed known GEJ/gastric cardia adenocarcinoma w/ small adjacent LN's, concerning for nodal mets, no distant mets identified  -05/2018: PET showed FDG-avid gastric cardia mass extending slightly into the distal esophagus (SUV 11.2), two small adjacent R gastric LN's with borderline FDG avidity; no evidence of metastasis; LN not amendable for biopsy by EUS per GI  -Late 05/2018 - present: peri-operative FOLFOX   2. 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 Barry Hughes of surgical oncology, and will order CT CAP in 3-4 weeks after the 3rd cycle of chemotherapy (tentatively between 3/30 and 08/02/2018) to assess for any evidence of disease progression -If he is a candidate for surgical resection, we will plan for 3 additional cycles of FOLFOX to complete the Hughes-op portion of peri-operative chemo;  given his neutropenia with FOLFOX, I will plan to omit 5-FU bolus with his adjuvant regimen  -PRN medications: Zofran, Compazine, Ativan  Chemotherapy-associated leukopenia -Secondary to FOLFOX -WBC 1.9k, ANC 600 -Patient denies any symptoms of infection -Chemotherapy cancelled today and rescheduled to 07/03/2018  -As this is his last cycle of chemotherapy prior to surgery, I will not make any adjustment, but will plan to omit bolus 5-FU with his adjuvant regimen   Chemotherapy-associated thrombocytopenia -Secondary to FOLFOX -Plts 187k, improving from 132k the week prior  -Patient denies any symptoms of bleeding -We will monitor it for now; no dose adjustment indicated   Chemotherapy-associated nausea -Relatively well controlled  -Continue PRN anti-emetics  Hypokalemia -K 3.4 today, new -Patient denies any symptoms associated with hypokalemia -I have prescribed KCl 3mEq daily   Mild hand foot syndrome -Secondary to 5-FU -Grade 1; no skin desquamation or mucositis -He was unable to get urea cream from his pharmacy; I instructed the patient to inquire his urea cream with his pharmacist, and if still unable to find it, I will send a prescription  -Continue OTC topical lotion for now   Orders Placed This Encounter  Procedures  . CT CHEST W CONTRAST    Standing Status:   Future    Standing Expiration Date:   06/26/2019    Order Specific Question:   ** REASON FOR EXAM (FREE TEXT)    Answer:   Gastric cancer Hughes neoadjuvant chemotherapy    Order Specific Question:   If indicated for the ordered procedure, I authorize the administration of contrast media per Radiology protocol    Answer:   Yes    Order Specific Question:   Preferred imaging location?  Answer:   [facility]    Order Specific Question:   Radiology Contrast Protocol - do NOT remove file path    Answer:   \\[email or web address]\[email or web address]\CTProtocols.pdf  . CT ABDOMEN PELVIS W CONTRAST    Standing Status:    Future    Standing Expiration Date:   06/26/2019    Order Specific Question:   ** REASON FOR EXAM (FREE TEXT)    Answer:   Gastric cancer Hughes neoadjuvant chemotherapy    Order Specific Question:   If indicated for the ordered procedure, I authorize the administration of contrast media per Radiology protocol    Answer:   Yes    Order Specific Question:   Preferred imaging location?    Answer:   [facility]    Order Specific Question:   Is Oral Contrast requested for this exam?    Answer:   Yes, Per Radiology protocol    Order Specific Question:   Radiology Contrast Protocol - do NOT remove file path    Answer:   \\[email or web address]\[email or web address]\CTProtocols.pdf   All questions were answered. The patient knows to call the clinic with any problems, questions or concerns. No barriers to learning was detected.  Return in 1 week for labs, port flush and Cycle 3 of FOLFOX. Return in 3 weeks for labs, port flush and clinic appt.   Tristan Men, MD 06/26/2018 11:25 AM  CHIEF COMPLAINT: "I am doing okay"  INTERVAL HISTORY: Tristan Hughes returns to clinic for follow-up of gastric cancer on FOLFOX.  Patient reports that he has mild exertional dyspnea that started this since he began chemotherapy, but is overall stable.  He has occasional nausea, which is well controlled with PRN antiemetics.  He also has mild erythematous rash and dryness in his bilateral hands, for which he has been applying OTC lotion, but he was unable to find the topical urea cream at the [facility] store when he asked a store clerk about it.  However he did not speak with the pharmacist regarding the availability of the cream.  He otherwise denies any other new complaint today.  SUMMARY OF ONCOLOGIC HISTORY:   Gastric adenocarcinoma ([place])   04/22/2018 Procedure    EGD: - An infiltrative and ulcerated, non-circumferential, friable mass with no bleeding and no stigmata of recent bleeding involving the lesser curvature of the stomach  and the cardia measuring approximately 4 cm x 2 cm. Biopsies were taken with a cold forceps for histology. Findings: - The exam of the stomach was otherwise normal. - One superficial distal esophageal ulcer which was an extention of the ulcerated gastric mass with no bleeding and no stigmata of recent bleeding was found 41 cm from the incisors at the EGJ . - The exam of the esophagus was otherwise normal. - A small non-bleeding diverticulum was found in the second portion of the duodenum. - The exam of the duodenum was otherwise normal.    04/22/2018 Pathology Results    Accession: [id number]  Diagnosis Surgical [P], GE junction-ulcerated lesion - POORLY DIFFERENTIATED ADENOCARCINOMA. - SEE COMMENT. Microscopic Comment The features slightly favor a gastric primary rather than an esophageal primary    04/29/2018 Imaging    CT CAP: IMPRESSION: 1. Irregular wall thickening of the proximal stomach extending from the gastroesophageal junction into the gastric cardia suspicious for infiltrative neoplasm given provided clinical history. Adjacent prominent lymph nodes, possibly small nodal metastases. No distant metastases identified. 2. No suspicious hepatic findings. 3. Posttraumatic right rib findings with probable incidental lesion involving  the posterior aspect of the right 4th rib. No suspicious pulmonary nodules. 4. Evidence of prior granulomatous disease with calcified hilar and upper abdominal lymph nodes and calcified splenic granulomas. 5.  Aortic Atherosclerosis (ICD10-I70.0).    05/21/2018 Imaging    PET: IMPRESSION: 1. Hypermetabolic mass primarily in the gastric cardia region extending slightly into the distal esophagus, maximum SUV 11.2, compatible with malignancy. Two small adjacent right gastric lymph nodes are indistinct and not definitively hypermetabolic but could possibly be involved. No findings of metastatic disease to the liver or elsewhere. 2. Prominent  physiologic activity in small and large bowel, without CT correlate. 3. No hypermetabolic rib lesion. 4. Other imaging findings of potential clinical significance: Mild thyroid goiter. Aortic Atherosclerosis (ICD10-I70.0). Mild-to-moderate cardiomegaly. Coronary atherosclerosis. Atrophic right infraspinatus muscle. Pleural calcifications, left greater than right. Prominent stool throughout the colon favors constipation. Lower lumbar spondylosis. Old granulomatous disease    05/29/2018 -  Chemotherapy    The patient had palonosetron (ALOXI) injection 0.25 mg, 0.25 mg, Intravenous,  Once, 2 of 6 cycles Administration: 0.25 mg (05/29/2018), 0.25 mg (06/12/2018) leucovorin 868 mg in dextrose 5 % 250 mL infusion, 400 mg/m2 = 868 mg, Intravenous,  Once, 2 of 6 cycles Administration: 868 mg (05/29/2018), 868 mg (06/12/2018) oxaliplatin (ELOXATIN) 185 mg in dextrose 5 % 500 mL chemo infusion, 85 mg/m2 = 185 mg, Intravenous,  Once, 2 of 6 cycles Administration: 185 mg (05/29/2018), 185 mg (06/12/2018) fluorouracil (ADRUCIL) chemo injection 850 mg, 400 mg/m2 = 850 mg, Intravenous,  Once, 2 of 6 cycles Administration: 850 mg (05/29/2018), 850 mg (06/12/2018) fluorouracil (ADRUCIL) 5,200 mg in sodium chloride 0.9 % 146 mL chemo infusion, 2,400 mg/m2 = 5,200 mg, Intravenous, 1 Day/Dose, 2 of 6 cycles Administration: 5,200 mg (05/29/2018), 5,200 mg (06/12/2018)  for chemotherapy treatment.      REVIEW OF SYSTEMS:   Constitutional: ( - ) fevers, ( - )  chills , ( - ) night sweats Eyes: ( - ) blurriness of vision, ( - ) double vision, ( - ) watery eyes Ears, nose, mouth, throat, and face: ( - ) mucositis, ( - ) sore throat Respiratory: ( - ) cough, ( + ) dyspnea, ( - ) wheezes Cardiovascular: ( - ) palpitation, ( - ) chest discomfort, ( - ) lower extremity swelling Gastrointestinal:  ( + ) nausea, ( - ) heartburn, ( - ) change in bowel habits Skin: ( + ) abnormal skin rashes Lymphatics: ( - ) new  lymphadenopathy, ( - ) easy bruising Neurological: ( - ) numbness, ( - ) tingling, ( - ) new weaknesses Behavioral/Psych: ( - ) mood change, ( - ) new changes  All other systems were reviewed with the patient and are negative.  I have reviewed the past medical history, past surgical history, social history and family history with the patient and they are unchanged from previous note.  ALLERGIES:  is allergic to naproxen sodium.  MEDICATIONS:  Current Outpatient Medications  Medication Sig Dispense Refill  . Ascorbic Acid (VITAMIN C) 1000 MG tablet Take 1,000 mg by mouth daily.    Marland Kitchen b complex vitamins tablet Take 1 tablet by mouth daily.    Marland Kitchen dexamethasone (DECADRON) 4 MG tablet Take 2 tablets (8 mg total) by mouth daily. Start the day after chemotherapy for 2 days. Take with food. 30 tablet 1  . Multiple Vitamin (MULTIVITAMIN) tablet Take 1 tablet by mouth daily.    . Omega-3 Fatty Acids (FISH OIL) 1200 MG CAPS Take by  mouth 2 (two) times daily.    Marland Kitchen amLODipine (NORVASC) 5 MG tablet TAKE 1 TABLET DAILY (PLEASE SCHEDULE A PHYSICAL FOR MORE REFILLS) 90 tablet 1  . aspirin 81 MG tablet Take 81 mg by mouth daily.    . calcium carbonate (TUMS - DOSED IN MG ELEMENTAL CALCIUM) 500 MG chewable tablet Chew 1 tablet by mouth daily.    . Cholecalciferol (VITAMIN D) 2000 UNITS CAPS Take 5,000 Units by mouth daily.     Marland Kitchen ezetimibe (ZETIA) 10 MG tablet TAKE 1 TABLET DAILY 90 tablet 2  . furosemide (LASIX) 20 MG tablet TAKE 1 TABLET TWICE A DAY 180 tablet 3  . Horse Chestnut 300 MG CAPS Take 300 mg by mouth 2 (two) times daily.    Marland Kitchen HUMALOG 100 UNIT/ML injection INJECT 15 TO 18 UNITS UNDER THE SKIN THREE TIMES A DAY BEFORE MEALS 50 mL 2  . Insulin Syringe-Needle U-100 (B-D INS SYR HALF-UNIT .3CC/31G) 31G X 5/16" 0.3 ML MISC by Does not apply route.    . Insulin Syringe-Needle U-100 31G X 5/16" 1 ML MISC Use once daily 100 each 3  . LANTUS 100 UNIT/ML injection INJECT 46 UNITS UNDER THE SKIN AT BEDTIME 50  mL 5  . lidocaine-prilocaine (EMLA) cream Apply to affected area once 30 g 3  . lisinopril (PRINIVIL,ZESTRIL) 20 MG tablet Take 10 mg by mouth daily.    Marland Kitchen LORazepam (ATIVAN) 0.5 MG tablet Take 1 tablet (0.5 mg total) by mouth every 6 (six) hours as needed (Nausea or vomiting). 30 tablet 0  . metFORMIN (GLUCOPHAGE) 1000 MG tablet TAKE 1 TABLET TWICE A DAY WITH MEALS 180 tablet 4  . metoprolol tartrate (LOPRESSOR) 50 MG tablet TAKE ONE AND ONE-HALF TABLETS TWICE A DAY (SCHEDULE A PHYSICAL FOR MORE REFILLS) 270 tablet 4  . mupirocin ointment (BACTROBAN) 2 % Place 1 application into the nose 2 (two) times daily. Apply to left second toe once daily 22 g 0  . potassium chloride SA (K-DUR,KLOR-CON) 20 MEQ tablet Take 1 tablet (20 mEq total) by mouth daily for 30 days. 30 tablet 5  . prochlorperazine (COMPAZINE) 10 MG tablet Take 1 tablet (10 mg total) by mouth every 6 (six) hours as needed (Nausea or vomiting). 30 tablet 1  . rosuvastatin (CRESTOR) 20 MG tablet TAKE 1 TABLET DAILY (PLEASE SCHEDULE A PHYSICAL FOR MORE REFILLS) 90 tablet 3  . tamsulosin (FLOMAX) 0.4 MG CAPS capsule Take 1 capsule (0.4 mg total) by mouth daily. 30 capsule 2  . traMADol (ULTRAM) 50 MG tablet Take 50 mg by mouth 3 (three) times daily as needed.    . vitamin E (VITAMIN E) 400 UNIT capsule Takes one tablet by mouth daily. (Patient taking differently: Take 400 Units by mouth daily. Takes one tablet by mouth daily.)     No current facility-administered medications for this visit.     PHYSICAL EXAMINATION: ECOG PERFORMANCE STATUS: 1 - Symptomatic but completely ambulatory  Today's Vitals   06/26/18 1040 06/26/18 1052  BP: 106/66   Pulse: (!) 56   Resp: 18   Temp: 98 F (36.7 C)   TempSrc: Oral   SpO2: 100%   Weight: 198 lb 1.6 oz (89.9 kg)   Height: 5\' 9"  (1.753 m)   PainSc:  0-No pain   Body mass index is 29.25 kg/m.  Filed Weights   06/26/18 1040  Weight: 198 lb 1.6 oz (89.9 kg)    GENERAL: alert, no  distress and comfortable SKIN: mild erythematous papules over  the dorsal surfaces of bilateral hands, no other rash  EYES: conjunctiva are pink and non-injected, sclera clear OROPHARYNX: no exudate, no erythema; lips, buccal mucosa, and tongue normal  NECK: supple, non-tender LYMPH:  no palpable lymphadenopathy in the cervical LUNGS: clear to auscultation with normal breathing effort HEART: regular rate & rhythm and no murmurs and no lower extremity edema ABDOMEN: soft, non-tender, non-distended, normal bowel sounds Musculoskeletal: no cyanosis of digits and no clubbing  PSYCH: alert & oriented x 3, fluent speech NEURO: no focal motor/sensory deficits  LABORATORY DATA:  I have reviewed the data as listed    Component Value Date/Time   NA 138 06/26/2018 0959   K 3.4 (L) 06/26/2018 0959   CL 100 06/26/2018 0959   CO2 29 06/26/2018 0959   GLUCOSE 194 (H) 06/26/2018 0959   BUN 18 06/26/2018 0959   CREATININE 0.96 06/26/2018 0959   CALCIUM 8.5 (L) 06/26/2018 0959   PROT 6.1 (L) 06/26/2018 0959   ALBUMIN 2.9 (L) 06/26/2018 0959   AST 21 06/26/2018 0959   ALT 27 06/26/2018 0959   ALKPHOS 59 06/26/2018 0959   BILITOT 0.6 06/26/2018 0959   GFRNONAA >60 06/26/2018 0959   GFRAA >60 06/26/2018 0959    No results found for: SPEP, UPEP  Lab Results  Component Value Date   WBC 1.9 (L) 06/26/2018   NEUTROABS 0.6 (L) 06/26/2018   HGB 13.5 06/26/2018   HCT 39.3 06/26/2018   MCV 87.1 06/26/2018   PLT 187 06/26/2018      Chemistry      Component Value Date/Time   NA 138 06/26/2018 0959   K 3.4 (L) 06/26/2018 0959   CL 100 06/26/2018 0959   CO2 29 06/26/2018 0959   BUN 18 06/26/2018 0959   CREATININE 0.96 06/26/2018 0959      Component Value Date/Time   CALCIUM 8.5 (L) 06/26/2018 0959   ALKPHOS 59 06/26/2018 0959   AST 21 06/26/2018 0959   ALT 27 06/26/2018 0959   BILITOT 0.6 06/26/2018 0959

## 2018-06-26 ENCOUNTER — Inpatient Hospital Stay: Payer: Medicare Other

## 2018-06-26 ENCOUNTER — Other Ambulatory Visit: Payer: Self-pay

## 2018-06-26 ENCOUNTER — Encounter: Payer: Self-pay | Admitting: Hematology

## 2018-06-26 ENCOUNTER — Inpatient Hospital Stay (HOSPITAL_BASED_OUTPATIENT_CLINIC_OR_DEPARTMENT_OTHER): Payer: Medicare Other | Admitting: Hematology

## 2018-06-26 VITALS — BP 106/66 | HR 56 | Temp 98.0°F | Resp 18 | Ht 69.0 in | Wt 198.1 lb

## 2018-06-26 DIAGNOSIS — D701 Agranulocytosis secondary to cancer chemotherapy: Secondary | ICD-10-CM | POA: Diagnosis not present

## 2018-06-26 DIAGNOSIS — Z7982 Long term (current) use of aspirin: Secondary | ICD-10-CM

## 2018-06-26 DIAGNOSIS — Z5111 Encounter for antineoplastic chemotherapy: Secondary | ICD-10-CM | POA: Diagnosis not present

## 2018-06-26 DIAGNOSIS — L271 Localized skin eruption due to drugs and medicaments taken internally: Secondary | ICD-10-CM

## 2018-06-26 DIAGNOSIS — C169 Malignant neoplasm of stomach, unspecified: Secondary | ICD-10-CM

## 2018-06-26 DIAGNOSIS — C165 Malignant neoplasm of lesser curvature of stomach, unspecified: Secondary | ICD-10-CM

## 2018-06-26 DIAGNOSIS — T451X5A Adverse effect of antineoplastic and immunosuppressive drugs, initial encounter: Secondary | ICD-10-CM

## 2018-06-26 DIAGNOSIS — Z794 Long term (current) use of insulin: Secondary | ICD-10-CM

## 2018-06-26 DIAGNOSIS — Z95828 Presence of other vascular implants and grafts: Secondary | ICD-10-CM

## 2018-06-26 DIAGNOSIS — R110 Nausea: Secondary | ICD-10-CM | POA: Diagnosis not present

## 2018-06-26 DIAGNOSIS — D6959 Other secondary thrombocytopenia: Secondary | ICD-10-CM | POA: Diagnosis not present

## 2018-06-26 DIAGNOSIS — E876 Hypokalemia: Secondary | ICD-10-CM

## 2018-06-26 DIAGNOSIS — Z79899 Other long term (current) drug therapy: Secondary | ICD-10-CM

## 2018-06-26 DIAGNOSIS — E46 Unspecified protein-calorie malnutrition: Secondary | ICD-10-CM

## 2018-06-26 LAB — CBC WITH DIFFERENTIAL (CANCER CENTER ONLY)
Abs Immature Granulocytes: 0 10*3/uL (ref 0.00–0.07)
Basophils Absolute: 0 10*3/uL (ref 0.0–0.1)
Basophils Relative: 2 %
Eosinophils Absolute: 0 10*3/uL (ref 0.0–0.5)
Eosinophils Relative: 0 %
HCT: 39.3 % (ref 39.0–52.0)
Hemoglobin: 13.5 g/dL (ref 13.0–17.0)
Immature Granulocytes: 0 %
Lymphocytes Relative: 42 %
Lymphs Abs: 0.8 10*3/uL (ref 0.7–4.0)
MCH: 29.9 pg (ref 26.0–34.0)
MCHC: 34.4 g/dL (ref 30.0–36.0)
MCV: 87.1 fL (ref 80.0–100.0)
Monocytes Absolute: 0.5 10*3/uL (ref 0.1–1.0)
Monocytes Relative: 26 %
Neutro Abs: 0.6 10*3/uL — ABNORMAL LOW (ref 1.7–7.7)
Neutrophils Relative %: 30 %
Platelet Count: 187 10*3/uL (ref 150–400)
RBC: 4.51 MIL/uL (ref 4.22–5.81)
RDW: 13.9 % (ref 11.5–15.5)
WBC Count: 1.9 10*3/uL — ABNORMAL LOW (ref 4.0–10.5)
nRBC: 0 % (ref 0.0–0.2)

## 2018-06-26 LAB — CMP (CANCER CENTER ONLY)
ALT: 27 U/L (ref 0–44)
AST: 21 U/L (ref 15–41)
Albumin: 2.9 g/dL — ABNORMAL LOW (ref 3.5–5.0)
Alkaline Phosphatase: 59 U/L (ref 38–126)
Anion gap: 9 (ref 5–15)
BUN: 18 mg/dL (ref 8–23)
CO2: 29 mmol/L (ref 22–32)
Calcium: 8.5 mg/dL — ABNORMAL LOW (ref 8.9–10.3)
Chloride: 100 mmol/L (ref 98–111)
Creatinine: 0.96 mg/dL (ref 0.61–1.24)
GFR, Est AFR Am: >60 mL/min (ref >60)
GFR, Estimated: >60 mL/min (ref >60)
Glucose, Bld: 194 mg/dL — ABNORMAL HIGH (ref 70–99)
Potassium: 3.4 mmol/L — ABNORMAL LOW (ref 3.5–5.1)
Sodium: 138 mmol/L (ref 135–145)
Total Bilirubin: 0.6 mg/dL (ref 0.3–1.2)
Total Protein: 6.1 g/dL — ABNORMAL LOW (ref 6.5–8.1)

## 2018-06-26 MED ORDER — POTASSIUM CHLORIDE CRYS ER 20 MEQ PO TBCR: 20.0000 meq | EXTENDED_RELEASE_TABLET | Freq: Every day | ORAL | 5 refills | Status: DC

## 2018-06-26 MED ORDER — PALONOSETRON HCL INJECTION 0.25 MG/5ML
INTRAVENOUS | Status: AC
  Filled 2018-06-26: qty 5

## 2018-06-26 MED ORDER — SODIUM CHLORIDE 0.9% FLUSH
10.0000 mL | Freq: Once | INTRAVENOUS | Status: AC
  Administered 2018-06-26: 10 mL
  Filled 2018-06-26: qty 10

## 2018-06-26 MED ORDER — DEXAMETHASONE SODIUM PHOSPHATE 10 MG/ML IJ SOLN
INTRAMUSCULAR | Status: AC
  Filled 2018-06-26: qty 1

## 2018-06-27 ENCOUNTER — Inpatient Hospital Stay: Payer: Medicare Other

## 2018-06-27 ENCOUNTER — Telehealth: Payer: Self-pay | Admitting: *Deleted

## 2018-06-27 DIAGNOSIS — C169 Malignant neoplasm of stomach, unspecified: Secondary | ICD-10-CM

## 2018-06-27 DIAGNOSIS — Z95828 Presence of other vascular implants and grafts: Secondary | ICD-10-CM

## 2018-06-27 DIAGNOSIS — Z5111 Encounter for antineoplastic chemotherapy: Secondary | ICD-10-CM | POA: Diagnosis not present

## 2018-06-27 MED ORDER — HEPARIN SOD (PORK) LOCK FLUSH 100 UNIT/ML IV SOLN
500.0000 [IU] | Freq: Once | INTRAVENOUS | Status: AC
  Administered 2018-06-27: 500 [IU]
  Filled 2018-06-27: qty 5

## 2018-06-27 MED ORDER — SODIUM CHLORIDE 0.9% FLUSH
10.0000 mL | Freq: Once | INTRAVENOUS | Status: AC
  Administered 2018-06-27: 10 mL
  Filled 2018-06-27: qty 10

## 2018-06-27 NOTE — Telephone Encounter (Signed)
"  Tristan Hughes [phone])  in yesterday for chemotherapy.  Did not receive chemotherapy based on labs.  My port-a-cath is accessed.  Return next Friday, March 6th.  No problems with port-a-cath just don't want to leave this accessed."  Instructed to come in at 2:00 pm for registration for flush appointment.  Apologized for need to return today.  Thanked this nurse for help.

## 2018-07-01 ENCOUNTER — Telehealth: Payer: Self-pay | Admitting: Gastroenterology

## 2018-07-01 ENCOUNTER — Other Ambulatory Visit: Payer: Self-pay

## 2018-07-01 DIAGNOSIS — D490 Neoplasm of unspecified behavior of digestive system: Secondary | ICD-10-CM

## 2018-07-01 DIAGNOSIS — C169 Malignant neoplasm of stomach, unspecified: Secondary | ICD-10-CM

## 2018-07-01 NOTE — Telephone Encounter (Signed)
-----   Message from Stark Klein, MD sent at 07/01/2018 12:41 PM EST ----- Not sure if I answered this, but yes, that would be goal to see about whether it is involved, and if so, how far that involvement goes up.   Fb ----- Message ----- From: Milus Banister, MD Sent: 06/26/2018  11:32 AM EST To: Ladene Artist, MD, Stark Klein, MD  Dorris Fetch,  Just to make sure, is your question whether or not the esophagus is involved with cancer?   ----- Message ----- From: Stark Klein, MD Sent: 06/26/2018  10:23 AM EST To: Milus Banister, MD, Ladene Artist, MD  Dr. Fuller Plan and/or Dr Ardis Hughs.  Do you think we can get EUS to assess the esophagus a little better?  We are trying to assess the likelihood of needing thoracic involvement at surgery.  Tx FB ----- Message ----- From: Grace Isaac, MD Sent: 06/25/2018   6:01 PM EST To: Stark Klein, MD  I can see him during that 5-6 week period then make a plan. EUS not as reliable after radiation, but looks like he is not getting radiation, so re scope and EUS after treatment may help resolve issue. Dr Roxan Hockey not doing esophageal resections. EBG ----- Message ----- From: Stark Klein, MD Sent: 06/25/2018   5:43 PM EST To: Grace Isaac, MD  This is one of the folks I was talking about.  He has not seen you.  Is getting third cycle of folfox tomorrow.  Did not get EUS pre chemo.  Fuller Plan noted distal esophageal ulcer that was biopsied and was positive.  PET shows extension into distal esophagus.  I am referring to you.  Would probably do surgery in around 5-6 weeks. Do you think EUS at this point would help in terms of knowing if your presence is needed?  The problem is if we can't get negative margins from the abdomen, it's not like you or dr. Roxan Hockey are just sitting around doing nothing!  I would rather be prepared.    Thanks FB

## 2018-07-01 NOTE — Telephone Encounter (Signed)
Tristan Hughes, happy to help.  It may be difficult to know for certain how much of the esophagus is involved with cancer by EUS but we can get a look and let me know what we think.  Thanks   Patty, This patient needs upper endoscopic ultrasound, radial, plus minus linear, MAC sedation, first available appointment with myself or Dr. Rush Landmark for GE junction adenocarcinoma staging after treatment.  Thanks

## 2018-07-01 NOTE — Telephone Encounter (Signed)
EUS on 4/16 at 1030 am with Dr Ardis Hughs at [address] [facility]

## 2018-07-01 NOTE — Telephone Encounter (Signed)
Left message on machine to call back  

## 2018-07-02 NOTE — Telephone Encounter (Signed)
EUS scheduled, pt instructed and medications reviewed.  Patient instructions mailed to home.  Patient to call with any questions or concerns.  

## 2018-07-03 ENCOUNTER — Other Ambulatory Visit: Payer: Self-pay | Admitting: Hematology

## 2018-07-04 ENCOUNTER — Inpatient Hospital Stay: Payer: Medicare Other

## 2018-07-04 ENCOUNTER — Inpatient Hospital Stay: Payer: Medicare Other | Attending: Hematology

## 2018-07-04 VITALS — BP 96/56 | HR 54 | Temp 97.9°F | Resp 18 | Wt 200.5 lb

## 2018-07-04 DIAGNOSIS — Z7982 Long term (current) use of aspirin: Secondary | ICD-10-CM | POA: Diagnosis not present

## 2018-07-04 DIAGNOSIS — I700 Atherosclerosis of aorta: Secondary | ICD-10-CM | POA: Diagnosis not present

## 2018-07-04 DIAGNOSIS — D6959 Other secondary thrombocytopenia: Secondary | ICD-10-CM | POA: Diagnosis not present

## 2018-07-04 DIAGNOSIS — R5383 Other fatigue: Secondary | ICD-10-CM | POA: Insufficient documentation

## 2018-07-04 DIAGNOSIS — C169 Malignant neoplasm of stomach, unspecified: Secondary | ICD-10-CM

## 2018-07-04 DIAGNOSIS — Z5111 Encounter for antineoplastic chemotherapy: Secondary | ICD-10-CM | POA: Insufficient documentation

## 2018-07-04 DIAGNOSIS — Z794 Long term (current) use of insulin: Secondary | ICD-10-CM | POA: Insufficient documentation

## 2018-07-04 DIAGNOSIS — Z79899 Other long term (current) drug therapy: Secondary | ICD-10-CM | POA: Insufficient documentation

## 2018-07-04 DIAGNOSIS — C165 Malignant neoplasm of lesser curvature of stomach, unspecified: Secondary | ICD-10-CM | POA: Diagnosis present

## 2018-07-04 DIAGNOSIS — T451X5S Adverse effect of antineoplastic and immunosuppressive drugs, sequela: Secondary | ICD-10-CM | POA: Insufficient documentation

## 2018-07-04 DIAGNOSIS — Z95828 Presence of other vascular implants and grafts: Secondary | ICD-10-CM

## 2018-07-04 LAB — CBC WITH DIFFERENTIAL (CANCER CENTER ONLY)
ABS IMMATURE GRANULOCYTES: 0.05 10*3/uL (ref 0.00–0.07)
Basophils Absolute: 0 10*3/uL (ref 0.0–0.1)
Basophils Relative: 1 %
Eosinophils Absolute: 0 10*3/uL (ref 0.0–0.5)
Eosinophils Relative: 0 %
HCT: 39.9 % (ref 39.0–52.0)
Hemoglobin: 13.2 g/dL (ref 13.0–17.0)
Immature Granulocytes: 1 %
Lymphocytes Relative: 19 %
Lymphs Abs: 1 10*3/uL (ref 0.7–4.0)
MCH: 29.9 pg (ref 26.0–34.0)
MCHC: 33.1 g/dL (ref 30.0–36.0)
MCV: 90.3 fL (ref 80.0–100.0)
Monocytes Absolute: 0.9 10*3/uL (ref 0.1–1.0)
Monocytes Relative: 17 %
Neutro Abs: 3.1 10*3/uL (ref 1.7–7.7)
Neutrophils Relative %: 62 %
Platelet Count: 212 10*3/uL (ref 150–400)
RBC: 4.42 MIL/uL (ref 4.22–5.81)
RDW: 15.3 % (ref 11.5–15.5)
WBC Count: 5.1 10*3/uL (ref 4.0–10.5)
nRBC: 0 % (ref 0.0–0.2)

## 2018-07-04 LAB — COMPREHENSIVE METABOLIC PANEL
ALT: 36 U/L (ref 0–44)
AST: 39 U/L (ref 15–41)
Albumin: 3 g/dL — ABNORMAL LOW (ref 3.5–5.0)
Alkaline Phosphatase: 52 U/L (ref 38–126)
Anion gap: 10 (ref 5–15)
BUN: 17 mg/dL (ref 8–23)
CO2: 27 mmol/L (ref 22–32)
Calcium: 8.6 mg/dL — ABNORMAL LOW (ref 8.9–10.3)
Chloride: 100 mmol/L (ref 98–111)
Creatinine, Ser: 0.95 mg/dL (ref 0.61–1.24)
GFR calc Af Amer: >60 mL/min (ref >60)
GFR calc non Af Amer: >60 mL/min (ref >60)
Glucose, Bld: 228 mg/dL — ABNORMAL HIGH (ref 70–99)
Potassium: 3.8 mmol/L (ref 3.5–5.1)
Sodium: 137 mmol/L (ref 135–145)
Total Bilirubin: 0.6 mg/dL (ref 0.3–1.2)
Total Protein: 6 g/dL — ABNORMAL LOW (ref 6.5–8.1)

## 2018-07-04 MED ORDER — ALTEPLASE 2 MG IJ SOLR
2.0000 mg | Freq: Once | INTRAMUSCULAR | Status: DC | PRN
  Filled 2018-07-04: qty 2

## 2018-07-04 MED ORDER — PALONOSETRON HCL INJECTION 0.25 MG/5ML
INTRAVENOUS | Status: AC
  Filled 2018-07-04: qty 5

## 2018-07-04 MED ORDER — DEXTROSE 5 % IV SOLN
Freq: Once | INTRAVENOUS | Status: AC
  Administered 2018-07-04: 11:00:00 via INTRAVENOUS
  Filled 2018-07-04: qty 250

## 2018-07-04 MED ORDER — SODIUM CHLORIDE 0.9% FLUSH
3.0000 mL | INTRAVENOUS | Status: DC | PRN
  Filled 2018-07-04: qty 10

## 2018-07-04 MED ORDER — OXALIPLATIN CHEMO INJECTION 100 MG/20ML
85.0000 mg/m2 | Freq: Once | INTRAVENOUS | Status: AC
  Administered 2018-07-04: 185 mg via INTRAVENOUS
  Filled 2018-07-04: qty 37

## 2018-07-04 MED ORDER — SODIUM CHLORIDE 0.9% FLUSH
10.0000 mL | INTRAVENOUS | Status: DC | PRN
  Administered 2018-07-04: 10 mL
  Filled 2018-07-04: qty 10

## 2018-07-04 MED ORDER — SODIUM CHLORIDE 0.9% FLUSH
10.0000 mL | Freq: Once | INTRAVENOUS | Status: AC
  Administered 2018-07-04: 10 mL
  Filled 2018-07-04: qty 10

## 2018-07-04 MED ORDER — DEXAMETHASONE SODIUM PHOSPHATE 10 MG/ML IJ SOLN
INTRAMUSCULAR | Status: AC
  Filled 2018-07-04: qty 1

## 2018-07-04 MED ORDER — PALONOSETRON HCL INJECTION 0.25 MG/5ML
0.2500 mg | Freq: Once | INTRAVENOUS | Status: AC
  Administered 2018-07-04: 0.25 mg via INTRAVENOUS

## 2018-07-04 MED ORDER — SODIUM CHLORIDE 0.9 % IV SOLN
2400.0000 mg/m2 | INTRAVENOUS | Status: DC
  Administered 2018-07-04: 5200 mg via INTRAVENOUS
  Filled 2018-07-04: qty 104

## 2018-07-04 MED ORDER — HEPARIN SOD (PORK) LOCK FLUSH 100 UNIT/ML IV SOLN
500.0000 [IU] | Freq: Once | INTRAVENOUS | Status: DC | PRN
  Filled 2018-07-04: qty 5

## 2018-07-04 MED ORDER — HEPARIN SOD (PORK) LOCK FLUSH 100 UNIT/ML IV SOLN
250.0000 [IU] | Freq: Once | INTRAVENOUS | Status: DC | PRN
  Filled 2018-07-04: qty 5

## 2018-07-04 MED ORDER — DEXAMETHASONE SODIUM PHOSPHATE 10 MG/ML IJ SOLN
10.0000 mg | Freq: Once | INTRAMUSCULAR | Status: AC
  Administered 2018-07-04: 10 mg via INTRAVENOUS

## 2018-07-04 MED ORDER — LEUCOVORIN CALCIUM INJECTION 350 MG
400.0000 mg/m2 | Freq: Once | INTRAVENOUS | Status: AC
  Administered 2018-07-04: 868 mg via INTRAVENOUS
  Filled 2018-07-04: qty 43.4

## 2018-07-04 NOTE — Patient Instructions (Signed)
[facility] Discharge Instructions for Patients Receiving Chemotherapy  Today you received the following chemotherapy agents: Oxaliplatin (Eloxatin), Leucovorin, Fluorouracil (Adrucil, 5-FU)  To help prevent nausea and vomiting after your treatment, we encourage you to take your nausea medication as directed.    If you develop nausea and vomiting that is not controlled by your nausea medication, call the clinic.   BELOW ARE SYMPTOMS THAT SHOULD BE REPORTED IMMEDIATELY:  *FEVER GREATER THAN 100.5 F  *CHILLS WITH OR WITHOUT FEVER  NAUSEA AND VOMITING THAT IS NOT CONTROLLED WITH YOUR NAUSEA MEDICATION  *UNUSUAL SHORTNESS OF BREATH  *UNUSUAL BRUISING OR BLEEDING  TENDERNESS IN MOUTH AND THROAT WITH OR WITHOUT PRESENCE OF ULCERS  *URINARY PROBLEMS  *BOWEL PROBLEMS  UNUSUAL RASH Items with * indicate a potential emergency and should be followed up as soon as possible.  Feel free to call the clinic should you have any questions or concerns. The clinic phone number is [phone].  Please show the CHEMO ALERT CARD at check-in to the Emergency Department and triage nurse.  [facility] Discharge Instructions for Patients receiving Home Portable Chemo Pump   **The bag should finish at 46 hours, 96 hours or 7 days. For example, if your pump is scheduled for 46 hours and it was put on at 4pm, it should finish at 2 pm the day it is scheduled to come off regardless of your appointment time.    Estimated time to finish   _________________________ (Have your nurse fill in)     ** if the display on your pump reads "Low Volume" and it is beeping, take the batteries out of the pump and come to the cancer center for it to be taken off.   **If the pump alarms go off prior to the pump reading "Low Volume" then call the [phone] and someone can assist you.  **If the plunger comes out and the bag fluid is running out, please use your chemo spill kit to clean  up the spill. Do not use paper towels or other house hold products.  ** If you have problems or questions regarding your pump, please call either the [phone] or the cancer center Monday-Friday 8:00am-4:30pm at [phone] and we will assist you.  If you are unable to get assistance then go to [facility] Emergency Room, ask the staff to contact the IV team for assistance.    

## 2018-07-05 ENCOUNTER — Ambulatory Visit: Payer: Medicare Other

## 2018-07-05 ENCOUNTER — Inpatient Hospital Stay: Payer: Medicare Other

## 2018-07-05 ENCOUNTER — Other Ambulatory Visit: Payer: Medicare Other

## 2018-07-05 ENCOUNTER — Other Ambulatory Visit: Payer: Self-pay | Admitting: *Deleted

## 2018-07-06 ENCOUNTER — Inpatient Hospital Stay: Payer: Medicare Other

## 2018-07-06 VITALS — BP 119/71 | HR 59 | Temp 97.5°F | Resp 18

## 2018-07-06 DIAGNOSIS — C169 Malignant neoplasm of stomach, unspecified: Secondary | ICD-10-CM

## 2018-07-06 DIAGNOSIS — C165 Malignant neoplasm of lesser curvature of stomach, unspecified: Secondary | ICD-10-CM | POA: Diagnosis not present

## 2018-07-06 MED ORDER — SODIUM CHLORIDE 0.9% FLUSH
10.0000 mL | INTRAVENOUS | Status: DC | PRN
  Administered 2018-07-06: 10 mL
  Filled 2018-07-06: qty 10

## 2018-07-06 MED ORDER — HEPARIN SOD (PORK) LOCK FLUSH 100 UNIT/ML IV SOLN
500.0000 [IU] | Freq: Once | INTRAVENOUS | Status: AC | PRN
  Administered 2018-07-06: 500 [IU]
  Filled 2018-07-06: qty 5

## 2018-07-08 ENCOUNTER — Encounter: Payer: Self-pay | Admitting: *Deleted

## 2018-07-09 ENCOUNTER — Encounter: Payer: Medicare Other | Admitting: Cardiothoracic Surgery

## 2018-07-09 ENCOUNTER — Institutional Professional Consult (permissible substitution) (INDEPENDENT_AMBULATORY_CARE_PROVIDER_SITE_OTHER): Payer: Medicare Other | Admitting: Cardiothoracic Surgery

## 2018-07-09 ENCOUNTER — Telehealth: Payer: Self-pay | Admitting: Gastroenterology

## 2018-07-09 VITALS — BP 110/56 | HR 66 | Resp 20 | Ht 69.0 in | Wt 195.0 lb

## 2018-07-09 DIAGNOSIS — C169 Malignant neoplasm of stomach, unspecified: Secondary | ICD-10-CM

## 2018-07-09 NOTE — Telephone Encounter (Signed)
Tristan Hughes I need a phone number to return call.

## 2018-07-09 NOTE — Telephone Encounter (Signed)
The pt appt was moved to 07/22/18 at 1130 am at [facility] with Dr Rush Landmark. [name] will notify the pt and go over new instructions,.

## 2018-07-09 NOTE — Telephone Encounter (Signed)
[phone] is the number

## 2018-07-09 NOTE — Progress Notes (Signed)
[address]       [place] [address]             [phone]                    Euan N Ansley [place] Medical Record #[id number] Date of Birth: October 18, 1940  Referring: Stark Klein, MD Primary Care: Eulas Post, MD Primary Cardiologist: No primary care provider on file.  Chief Complaint:    Chief Complaint  Patient presents with  . Esophageal Cancer    Surgical eval, CTA C/A/P 04/29/18, PET Scan 05/21/18,Upper Endo 04/22/18    History of Present Illness:    Tristan Hughes 78 y.o. 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  The patient noted the onset of upper abdominal and gastric discomfort in October 2019.  Initially tried Tums and acid blockers without much effect.  Upper GI endoscopy was done with nonbleeding ulceration involving the lesser curvature of the stomach.  The patient has started on FOLFOX for which he has had 3 cycles.  He has not had radiation.  He has been referred by Dr. Barry Dienes, in case surgical resection would require an Ivor Bobby Rumpf, with anastomosis in the right chest, if an adequate gastric esophageal margin cannot be obtained from the abdomen.  Patient denies weight loss is able to take a p.o. diet relatively well without pain on eating or obstruction to swallowing.  Patient has a previous cardiac history having had coronary artery bypass grafting in 2012, while living in [place], he is currently followed from a cardiac standpoint by Dr. Bettina Gavia.  Patient is a lifelong non-smoker, does not drink alcohol, he retired from [employer].   Family history and assessment for his mother who died age 57 of pancreatic cancer   Diagnosis Surgical [P], GE junction-ulcerated lesion - POORLY DIFFERENTIATED ADENOCARCINOMA. - SEE COMMENT. Microscopic Comment The features slightly favor a gastric primary rather than an esophageal primary. Dr. Vicente Males  has reviewed the case and concurs with this interpretation. Dr. Fuller Plan was paged on 04/26/2018. (JBK:ah 04/26/18) Enid Cutter MD   Current Activity/ Functional Status:  Patient is independent with mobility/ambulation, transfers, ADL's, IADL's.   Zubrod Score: At the time of surgery this patient's most appropriate activity status/level should be described as: []     0    Normal activity, no symptoms [x]     1    Restricted in physical strenuous activity but ambulatory, able to do out light work []     2    Ambulatory and capable of self care, unable to do work activities, up and about               >50 % of waking hours                              []     3    Only limited self care, in bed greater than 50% of waking hours []     4    Completely disabled, no self care, confined to bed or chair []     5    Moribund   Past Medical History:  Diagnosis Date  . CAD 07/09/2009   Cath 12-02-10 90% left main, 90%LAD, 99% circumflex, 80% RCA  . Cancer ([place])    Stomach and esophagus  . Cataract  basal cell CA- left ear  . COLONIC POLYPS, HX OF 07/09/2009   Qualifier: Diagnosis of  By: Valma Cava LPN, [name]    . DIAB W/O COMP TYPE II/UNS NOT STATED UNCNTRL 07/09/2009   Qualifier: Diagnosis of  By: Joyce Gross    . GERD (gastroesophageal reflux disease)   . History of chicken pox   . HYPERLIPIDEMIA 07/09/2009   Qualifier: Diagnosis of  By: Valma Cava LPN, [name]    . HYPERTENSION 07/09/2009   Qualifier: Diagnosis of  By: Valma Cava LPN, [name]    . OBSTRUCTIVE SLEEP APNEA 07/09/2009   Qualifier: Diagnosis of  By: Joyce Gross    . Sleep apnea    no CPAP    Past Surgical History:  Procedure Laterality Date  . BIOPSY BREAST    . CHOLECYSTECTOMY    . COLONOSCOPY    . CORONARY ANGIOPLASTY WITH STENT PLACEMENT    . CORONARY ARTERY BYPASS GRAFT  8/12   CABG X 4 LIMA-LAD, SVG-PDB, SVG-OM/RAMUS   /  . IR IMAGING GUIDED PORT INSERTION  05/15/2018  . TONSILLECTOMY      Family History  Problem  Relation Age of Onset  . Hypertension Mother   . Heart disease Mother   . Stroke Mother   . Diabetes Mother   . Cancer Mother        pancreatic  . Pancreatic cancer Mother   . Heart disease Father   . Hypertension Father   . Colon cancer Maternal Uncle   . Esophageal cancer Neg Hx   . Stomach cancer Neg Hx   . Rectal cancer Neg Hx    Patient has 8 children, all healthy, one grandchild with Down syndrome and esophageal atresia, patient's mother died at age 65 with pancreatic cancer   Social History   Tobacco Use  Smoking Status Never Smoker  Smokeless Tobacco Never Used    Social History   Substance and Sexual Activity  Alcohol Use No     Allergies  Allergen Reactions  . Naproxen Sodium     REACTION: swelling    Current Outpatient Medications  Medication Sig Dispense Refill  . amLODipine (NORVASC) 5 MG tablet TAKE 1 TABLET DAILY (PLEASE SCHEDULE A PHYSICAL FOR MORE REFILLS) 90 tablet 1  . Ascorbic Acid (VITAMIN C) 1000 MG tablet Take 1,000 mg by mouth daily.    Marland Kitchen aspirin 81 MG tablet Take 81 mg by mouth daily.    Marland Kitchen b complex vitamins tablet Take 1 tablet by mouth daily.    . calcium carbonate (TUMS - DOSED IN MG ELEMENTAL CALCIUM) 500 MG chewable tablet Chew 1 tablet by mouth daily.    . Cholecalciferol (VITAMIN D) 2000 UNITS CAPS Take 5,000 Units by mouth daily.     Marland Kitchen dexamethasone (DECADRON) 4 MG tablet Take 2 tablets (8 mg total) by mouth daily. Start the day after chemotherapy for 2 days. Take with food. 30 tablet 1  . ezetimibe (ZETIA) 10 MG tablet TAKE 1 TABLET DAILY 90 tablet 2  . furosemide (LASIX) 20 MG tablet TAKE 1 TABLET TWICE A DAY 180 tablet 3  . Horse Chestnut 300 MG CAPS Take 300 mg by mouth 2 (two) times daily.    Marland Kitchen HUMALOG 100 UNIT/ML injection INJECT 15 TO 18 UNITS UNDER THE SKIN THREE TIMES A DAY BEFORE MEALS 50 mL 2  . Insulin Syringe-Needle U-100 (B-D INS SYR HALF-UNIT .3CC/31G) 31G X 5/16" 0.3 ML MISC by Does not apply route.    . Insulin  Syringe-Needle U-100 31G  X 5/16" 1 ML MISC Use once daily 100 each 3  . LANTUS 100 UNIT/ML injection INJECT 46 UNITS UNDER THE SKIN AT BEDTIME 50 mL 5  . lidocaine-prilocaine (EMLA) cream Apply to affected area once 30 g 3  . lisinopril (PRINIVIL,ZESTRIL) 20 MG tablet Take 10 mg by mouth daily.    Marland Kitchen LORazepam (ATIVAN) 0.5 MG tablet Take 1 tablet (0.5 mg total) by mouth every 6 (six) hours as needed (Nausea or vomiting). 30 tablet 0  . metFORMIN (GLUCOPHAGE) 1000 MG tablet TAKE 1 TABLET TWICE A DAY WITH MEALS 180 tablet 4  . metoprolol tartrate (LOPRESSOR) 50 MG tablet TAKE ONE AND ONE-HALF TABLETS TWICE A DAY (SCHEDULE A PHYSICAL FOR MORE REFILLS) 270 tablet 4  . Multiple Vitamin (MULTIVITAMIN) tablet Take 1 tablet by mouth daily.    . mupirocin ointment (BACTROBAN) 2 % Place 1 application into the nose 2 (two) times daily. Apply to left second toe once daily 22 g 0  . Omega-3 Fatty Acids (FISH OIL) 1200 MG CAPS Take by mouth 2 (two) times daily.    . potassium chloride SA (K-DUR,KLOR-CON) 20 MEQ tablet Take 1 tablet (20 mEq total) by mouth daily for 30 days. 30 tablet 5  . prochlorperazine (COMPAZINE) 10 MG tablet Take 1 tablet (10 mg total) by mouth every 6 (six) hours as needed (Nausea or vomiting). 30 tablet 1  . rosuvastatin (CRESTOR) 20 MG tablet TAKE 1 TABLET DAILY (PLEASE SCHEDULE A PHYSICAL FOR MORE REFILLS) 90 tablet 3  . tamsulosin (FLOMAX) 0.4 MG CAPS capsule Take 1 capsule (0.4 mg total) by mouth daily. 30 capsule 2  . traMADol (ULTRAM) 50 MG tablet Take 50 mg by mouth 3 (three) times daily as needed.    . vitamin E (VITAMIN E) 400 UNIT capsule Takes one tablet by mouth daily. (Patient taking differently: Take 400 Units by mouth daily. Takes one tablet by mouth daily.)     No current facility-administered medications for this visit.     Pertinent items are noted in HPI.   Review of Systems:     Cardiac Review of Systems: [Y] = yes  or   [ N ] = no   Chest Pain [  n  ]  Resting  SOB [  n ] Exertional SOB  [ y ]  Vertell Limber Florencio.Farrier  ]   Pedal Edema Florencio.Farrier   ]    Palpitations [ n ] Syncope  [ n ]   Presyncope [n   ]   General Review of Systems: [Y] = yes [  ]=no Constitional: recent weight change [  ];  Wt loss over the last 3 months [   ] anorexia [  ]; fatigue [  ]; nausea [  ]; night sweats [  ]; fever [  ]; or chills [  ];           Eye : blurred vision [  ]; diplopia [   ]; vision changes [  ];  Amaurosis fugax[  ]; Resp: cough [  ];  wheezing[  ];  hemoptysis[  ]; shortness of breath[  ]; paroxysmal nocturnal dyspnea[  ]; dyspnea on exertion[  ]; or orthopnea[  ];  GI:  gallstones[  ], vomiting[  ];  dysphagia[  ]; melena[  ];  hematochezia [  ]; heartburn[  ];   Hx of  Colonoscopy[  ]; GU: kidney stones [  ]; hematuria[  ];   dysuria [  ];  nocturia[  ];  history of     obstruction [  ]; urinary frequency [ y ]             Skin: rash, swelling[  ];, hair loss[  ];  peripheral edema[  ];  or itching[  ]; Musculosketetal: myalgias[  ];  joint swelling[  ];  joint erythema[  ];  joint pain[  ];  back pain[  ];  Heme/Lymph: bruising[  ];  bleeding[  ];  anemia[  ];  Neuro: TIA[  ];  headaches[  ];  stroke[  ];  vertigo[  ];  seizures[  ];   paresthesias[  ];  difficulty walking[  ];  Psych:depression[  ]; anxiety[  ];  Endocrine: diabetes[  ];  thyroid dysfunction[  ];  Immunizations: Flu up to date [  ]; Pneumococcal up to date [  ];  Other:     PHYSICAL EXAMINATION: BP (!) 110/56   Pulse 66   Resp 20   Ht 5\' 9"  (1.753 m)   Wt 195 lb (88.5 kg)   SpO2 96%   BMI 28.80 kg/m  General appearance: alert, cooperative and no distress Head: Normocephalic, without obvious abnormality, atraumatic Neck: no adenopathy, no carotid bruit, no JVD, supple, symmetrical, trachea midline and thyroid not enlarged, symmetric, no tenderness/mass/nodules Lymph nodes: Cervical, supraclavicular, and axillary nodes normal. Resp: clear to auscultation bilaterally Back: symmetric, no  curvature. ROM normal. No CVA tenderness. Cardio: regular rate and rhythm, S1, S2 normal, no murmur, click, rub or gallop GI: soft, non-tender; bowel sounds normal; no masses,  no organomegaly Extremities: extremities normal, atraumatic, no cyanosis or edema and Homans sign is negative, no sign of DVT Neurologic: Grossly normal  Patient has a well-healed sternotomy incision sternum is stable Right upper quadrant subcostal incision from excision of gangrenous gallbladder in 1997  Diagnostic Studies & Laboratory data:     Recent Radiology Findings:  CLINICAL DATA:  Initial treatment strategy for gastric cancer.  EXAM: NUCLEAR MEDICINE PET SKULL BASE TO THIGH  TECHNIQUE: 10.6 mCi F-18 FDG was injected intravenously. Full-ring PET imaging was performed from the skull base to thigh after the radiotracer. CT data was obtained and used for attenuation correction and anatomic localization.  Fasting blood glucose: 73 mg/dl  COMPARISON:  04/29/2018  FINDINGS: Mediastinal blood pool activity: SUV max 2.7  NECK: Enlarged left thyroid lobe with small calcifications and hypodensities but no accentuated metabolic activity to suggest a significant nodule.  Incidental CT findings: Left common carotid atherosclerotic calcification.  CHEST: The hypermetabolic activity in the stomach extends slightly into the distal esophagus, with esophageal portion having a maximum SUV of 7.6.  Incidental CT findings: Right Port-A-Cath tip: SVC. Coronary, aortic arch, and branch vessel atherosclerotic vascular disease. Mild-to-moderate cardiomegaly. Prior median sternotomy. Mild bilateral gynecomastia. Atrophic right infraspinatus muscle. Pleural thickening and pleural calcification on the left side. There also some calcified pleural plaques on the right side.  ABDOMEN/PELVIS: Wall thickening and accentuated activity in the gastric fundus extending into the esophagus, maximum SUV in  this vicinity 11.2.  There is unusually high activity in multiple loops of small and large bowel, without CT correlate, compatible with considerable accentuated physiologic activity. Scattered lymph nodes in the mesentery are not appreciably hypermetabolic.  8 mm right gastric node on image 104/4, questionable accentuated metabolic activity. Adjacent vascular structures noted.  Descending duodenal diverticulum.  Incidental CT findings: Prominent stool throughout the colon favors constipation. Redundant sigmoid colon extending into the right upper quadrant. Calcifications in the spleen compatible  with old granulomatous disease. Aortoiliac atherosclerotic vascular disease. Small pelvic lymph nodes are not pathologically enlarged by size criteria. Right scrotal hydrocele.  SKELETON: No significant abnormal hypermetabolic activity in this region.  Incidental CT findings: Nonunited right seventh and eighth rib fractures. Grade 1 degenerative anterolisthesis at L5-S1. Lower lumbar spondylosis.  IMPRESSION: 1. Hypermetabolic mass primarily in the gastric cardia region extending slightly into the distal esophagus, maximum SUV 11.2, compatible with malignancy. Two small adjacent right gastric lymph nodes are indistinct and not definitively hypermetabolic but could possibly be involved. No findings of metastatic disease to the liver or elsewhere. 2. Prominent physiologic activity in small and large bowel, without CT correlate. 3. No hypermetabolic rib lesion. 4. Other imaging findings of potential clinical significance: Mild thyroid goiter. Aortic Atherosclerosis (ICD10-I70.0). Mild-to-moderate cardiomegaly. Coronary atherosclerosis. Atrophic right infraspinatus muscle. Pleural calcifications, left greater than right. Prominent stool throughout the colon favors constipation. Lower lumbar spondylosis. Old granulomatous disease.   Electronically Signed   By: Van Clines M.D.   On: 05/21/2018 17:50 I have independently reviewed the above radiology studies  and reviewed the findings with the patient.   Recent Lab Findings: Lab Results  Component Value Date   WBC 5.1 07/04/2018   HGB 13.2 07/04/2018   HCT 39.9 07/04/2018   PLT 212 07/04/2018   GLUCOSE 228 (H) 07/04/2018   CHOL 104 11/16/2017   TRIG 98.0 11/16/2017   HDL 39.60 11/16/2017   LDLCALC 45 11/16/2017   ALT 36 07/04/2018   AST 39 07/04/2018   NA 137 07/04/2018   K 3.8 07/04/2018   CL 100 07/04/2018   CREATININE 0.95 07/04/2018   BUN 17 07/04/2018   CO2 27 07/04/2018   INR 0.90 05/15/2018   HGBA1C 5.9 (A) 05/20/2018   ENDOSCOPY: - An infiltrative and ulcerated, non-circumferential, friable mass with no bleeding and no stigmata of recent bleeding involving the lesser curvature of the stomach and the cardia measuring approximately 4 cm x 2 cm. Biopsies were taken with a cold forceps for histology. Findings: - The exam of the stomach was otherwise normal. - One superficial distal esophageal ulcer which was an extention of the ulcerated gastric mass with no bleeding and no stigmata of recent bleeding was found 41 cm from the incisors at the EGJ . - The exam of the esophagus was otherwise normal. - A small non-bleeding diverticulum was found in the second portion of the duodenum. - The exam of the duodenum was otherwise normal    Assessment / Plan:   Patient with gastric carcinoma involving the lesser curve of the stomach to the GE junction-currently under treatment with FOLFOX preoperatively.  Patient to have repeat CT scan chest and abdomen March 30.  Repeat endoscopy with EUS in early April.  If the scans show no contraindication to surgical resection will coordinate with Dr. Barry Dienes surgical resection with plans for potential right thoracotomy approach should obtaining a negative margin superiorly become an issue.   I discussed with the patient and his wife surgical  resection of the stomach and partial esophagectomy in general terms.  I will see them back just after the CT scan of the chest and abdomen is done March 30.     I  spent 60 minutes with  the patient face to face.   Grace Isaac MD      [address] [place] [address] Office [phone]   Beeper [phone]  07/09/2018 12:06 PM

## 2018-07-09 NOTE — Telephone Encounter (Signed)
Jen with [facility] from Dr. Servando Snare office called and would like to know if they can move the patient apt that is at [facility] up a week or so. The PT had an appt today with Dr. Servando Snare and and also said that there is notes for you to see.

## 2018-07-16 NOTE — Progress Notes (Signed)
[facility] OFFICE PROGRESS NOTE  Patient Care Team: Eulas Post, MD as PCP - General  HEME/ONC OVERVIEW: 1. 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 -03/2018: presented to GI for evaluation of persistent epigastric pain; EGD showed an infiltrating and ulcerated, non-circumferential, friable mass involving the lesser curvature of the stomach and the cardia measuring approximately 4 x 2 cm, one superficial distal esophageal ulcer thought to be in the extension of the ulcerated gastric mass; no stigmata of bleeding; biopsy of GEJ ulcer showed poorly differentiated adenocarcinoma, favoring gastric primary; CT CAP showed known GEJ/gastric cardia adenocarcinoma w/ small adjacent LN's, concerning for nodal mets, no distant mets identified  -05/2018: PET showed FDG-avid gastric cardia mass extending slightly into the distal esophagus (SUV 11.2), two small adjacent R gastric LN's with borderline FDG avidity; no evidence of metastasis; LN not amendable for biopsy by EUS per GI  -Late 05/2018 - present: peri-operative FOLFOX   2. 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, at least -S/p 3 cycles of pre-operative FOLFOX; Cycle 3 delayed due to neutropenia and bolus 5-FU was omitted -Interim EUS scheduled on 07/22/2018; CT CAP scheduled on 07/29/2018 -Post-operative FOLFOX TBD, pending surgical date; 5-FU omitted due to cytopenias  -In light of the ongoing viral infection, I will plan to see the patient back after surgery to reduce the risk of exposure; patient understands that he can contact us as needed prior to his surgery -Based on the pathology from the surgery, we will determine the timing and regimen of post-operative chemotherapy   -PRN anti-emetic medications: Zofran, Compazine and Ativan  Chemotherapy-associated thrombocytopenia -Secondary to chemotherapy -Plts 12.4, slightly lowe than prior -Patient denies any symptoms of bleeding or excess bruising, such as epistaxis, hematochezia, melena, or hematuria -We will monitor for now  Chemotherapy-associated thrombocytopenia -Secondary to chemotherapy -Plts 124k, lower than prior -Patient denies any symptoms of bleeding or excess bruising, such as epistaxis, hematochezia, melena, or hematuria -We will monitor for now  Port-a-cath in place -Plan for q6-8week flush, next due in early 07/2018   Orders Placed This Encounter  Procedures  . CBC with Differential (Cancer Center Only)    Standing Status:   Future    Standing Expiration Date:   08/21/2019  . CMP ([facility] only)    Standing Status:   Future    Standing Expiration Date:   08/21/2019   All questions were answered. The patient knows to call the clinic with any problems, questions or concerns. No barriers to learning was detected.  A total of more than 25 minutes were spent face-to-face with the patient during this encounter and over half of that time was spent on counseling and coordination of care as outlined above.   Return on 09/04/2018 for labs, port flush and clinic appt.  [name], MD 07/17/2018 4:13 PM  CHIEF COMPLAINT: "I am just a little tired* "  INTERVAL HISTORY: Tristan Hughes returns to clinic for follow-up of gastric cancer s/p preoperative portion of FOLFOX.  Patient reports that since completing the chemotherapy, he has felt a little more tired and has not been as active at home as he used to be.  However, he has not had any recurrent nausea or diarrhea since completing chemotherapy.  He met with Dr. Servando Snare  recently, and the plan is to repeat EUS on 07/22/2018, followed by CT CAP on 07/29/2018, and appointment with Dr. Servando Snare on 08/01/2018 to determine the plan for surgery.  He denies any  other complaint today.  SUMMARY OF ONCOLOGIC HISTORY:   Gastric adenocarcinoma ([place])   04/22/2018 Procedure    EGD: - An infiltrative and ulcerated, non-circumferential, friable mass with no bleeding and no stigmata of recent bleeding involving the lesser curvature of the stomach and the cardia measuring approximately 4 cm x 2 cm. Biopsies were taken with a cold forceps for histology. Findings: - The exam of the stomach was otherwise normal. - One superficial distal esophageal ulcer which was an extention of the ulcerated gastric mass with no bleeding and no stigmata of recent bleeding was found 41 cm from the incisors at the EGJ . - The exam of the esophagus was otherwise normal. - A small non-bleeding diverticulum was found in the second portion of the duodenum. - The exam of the duodenum was otherwise normal.    04/22/2018 Pathology Results    Accession: [id number]  Diagnosis Surgical [P], GE junction-ulcerated lesion - POORLY DIFFERENTIATED ADENOCARCINOMA. - SEE COMMENT. Microscopic Comment The features slightly favor a gastric primary rather than an esophageal primary    04/29/2018 Imaging    CT CAP: IMPRESSION: 1. Irregular wall thickening of the proximal stomach extending from the gastroesophageal junction into the gastric cardia suspicious for infiltrative neoplasm given provided clinical history. Adjacent prominent lymph nodes, possibly small nodal metastases. No distant metastases identified. 2. No suspicious hepatic findings. 3. Posttraumatic right rib findings with probable incidental lesion involving the posterior aspect of the right 4th rib. No suspicious pulmonary nodules. 4. Evidence of prior granulomatous disease with calcified hilar and upper abdominal lymph nodes and calcified splenic granulomas. 5.  Aortic Atherosclerosis (ICD10-I70.0).    05/21/2018 Imaging    PET: IMPRESSION: 1. Hypermetabolic mass primarily in the gastric cardia region extending  slightly into the distal esophagus, maximum SUV 11.2, compatible with malignancy. Two small adjacent right gastric lymph nodes are indistinct and not definitively hypermetabolic but could possibly be involved. No findings of metastatic disease to the liver or elsewhere. 2. Prominent physiologic activity in small and large bowel, without CT correlate. 3. No hypermetabolic rib lesion. 4. Other imaging findings of potential clinical significance: Mild thyroid goiter. Aortic Atherosclerosis (ICD10-I70.0). Mild-to-moderate cardiomegaly. Coronary atherosclerosis. Atrophic right infraspinatus muscle. Pleural calcifications, left greater than right. Prominent stool throughout the colon favors constipation. Lower lumbar spondylosis. Old granulomatous disease    05/29/2018 -  Chemotherapy    The patient had palonosetron (ALOXI) injection 0.25 mg, 0.25 mg, Intravenous,  Once, 3 of 6 cycles Administration: 0.25 mg (05/29/2018), 0.25 mg (06/12/2018), 0.25 mg (07/04/2018) leucovorin 868 mg in dextrose 5 % 250 mL infusion, 400 mg/m2 = 868 mg, Intravenous,  Once, 3 of 6 cycles Administration: 868 mg (05/29/2018), 868 mg (06/12/2018), 868 mg (07/04/2018) oxaliplatin (ELOXATIN) 185 mg in dextrose 5 % 500 mL chemo infusion, 85 mg/m2 = 185 mg, Intravenous,  Once, 3 of 6 cycles Administration: 185 mg (05/29/2018), 185 mg (06/12/2018), 185 mg (07/04/2018) fluorouracil (ADRUCIL) chemo injection 850 mg, 400 mg/m2 = 850 mg, Intravenous,  Once, 2 of 2 cycles Administration: 850 mg (05/29/2018), 850 mg (06/12/2018) fluorouracil (ADRUCIL) 5,200 mg in sodium chloride 0.9 % 146 mL chemo infusion, 2,400 mg/m2 = 5,200 mg, Intravenous, 1 Day/Dose, 3 of 6 cycles Administration: 5,200 mg (05/29/2018), 5,200 mg (06/12/2018), 5,200 mg (07/04/2018)  for chemotherapy treatment.  REVIEW OF SYSTEMS:   Constitutional: ( - ) fevers, ( - )  chills , ( - ) night sweats Eyes: ( - ) blurriness of vision, ( - ) double vision, ( - ) watery  eyes Ears, nose, mouth, throat, and face: ( - ) mucositis, ( - ) sore throat Respiratory: ( - ) cough, ( - ) dyspnea, ( - ) wheezes Cardiovascular: ( - ) palpitation, ( - ) chest discomfort, ( - ) lower extremity swelling Gastrointestinal:  ( - ) nausea, ( - ) heartburn, ( - ) change in bowel habits Skin: ( - ) abnormal skin rashes Lymphatics: ( - ) new lymphadenopathy, ( - ) easy bruising Neurological: ( - ) numbness, ( - ) tingling, ( - ) new weaknesses Behavioral/Psych: ( - ) mood change, ( - ) new changes  All other systems were reviewed with the patient and are negative.  I have reviewed the past medical history, past surgical history, social history and family history with the patient and they are unchanged from previous note.  ALLERGIES:  is allergic to naproxen sodium.  MEDICATIONS:  Current Outpatient Medications  Medication Sig Dispense Refill  . mupirocin ointment (BACTROBAN) 2 % Place 1 application into the nose 2 (two) times daily. Apply to left second toe once daily (Patient taking differently: Apply 1 application topically once. Apply to left second toe once daily) 22 g 0  . amLODipine (NORVASC) 5 MG tablet TAKE 1 TABLET DAILY (PLEASE SCHEDULE A PHYSICAL FOR MORE REFILLS) (Patient taking differently: Take 5 mg by mouth every evening. ) 90 tablet 1  . Ascorbic Acid (VITAMIN C) 1000 MG tablet Take 1,000 mg by mouth every evening.     Marland Kitchen aspirin EC 81 MG tablet Take 81 mg by mouth every evening.    Marland Kitchen b complex vitamins tablet Take 1 tablet by mouth every evening.     . calcium carbonate (TUMS - DOSED IN MG ELEMENTAL CALCIUM) 500 MG chewable tablet Chew 1-2 tablets by mouth 2 (two) times daily as needed (acid reflux/indigestion.).     Marland Kitchen Cholecalciferol (VITAMIN D-3) 125 MCG (5000 UT) TABS Take 5,000 Units by mouth daily.    Marland Kitchen dexamethasone (DECADRON) 4 MG tablet Take 2 tablets (8 mg total) by mouth daily. Start the day after chemotherapy for 2 days. Take with food. (Patient taking  differently: Take 8 mg by mouth See admin instructions. Start the day after chemotherapy for 2 days. Take with food.) 30 tablet 1  . ezetimibe (ZETIA) 10 MG tablet TAKE 1 TABLET DAILY (Patient taking differently: Take 10 mg by mouth daily. ) 90 tablet 2  . furosemide (LASIX) 20 MG tablet TAKE 1 TABLET TWICE A DAY (Patient taking differently: Take 20 mg by mouth 2 (two) times daily. ) 180 tablet 3  . Horse Chestnut 300 MG CAPS Take 300 mg by mouth 2 (two) times daily.    Marland Kitchen HUMALOG 100 UNIT/ML injection INJECT 15 TO 18 UNITS UNDER THE SKIN THREE TIMES A DAY BEFORE MEALS (Patient taking differently: Inject 15-18 Units into the skin 3 (three) times daily before meals. ) 50 mL 2  . Insulin Syringe-Needle U-100 (B-D INS SYR HALF-UNIT .3CC/31G) 31G X 5/16" 0.3 ML MISC by Does not apply route.    . Insulin Syringe-Needle U-100 31G X 5/16" 1 ML MISC Use once daily 100 each 3  . LANTUS 100 UNIT/ML injection INJECT 46 UNITS UNDER THE SKIN AT BEDTIME (Patient taking differently: Inject 46 Units into the skin  at bedtime. ) 50 mL 5  . lidocaine-prilocaine (EMLA) cream Apply to affected area once (Patient taking differently: Apply 1 application topically daily as needed (prior to port being accessed.). ) 30 g 3  . lisinopril (PRINIVIL,ZESTRIL) 20 MG tablet Take 10 mg by mouth daily.    Marland Kitchen LORazepam (ATIVAN) 0.5 MG tablet Take 1 tablet (0.5 mg total) by mouth every 6 (six) hours as needed (Nausea or vomiting). 30 tablet 0  . metFORMIN (GLUCOPHAGE) 1000 MG tablet TAKE 1 TABLET TWICE A DAY WITH MEALS (Patient taking differently: Take 1,000 mg by mouth 2 (two) times daily. ) 180 tablet 4  . metoprolol tartrate (LOPRESSOR) 50 MG tablet TAKE ONE AND ONE-HALF TABLETS TWICE A DAY (SCHEDULE A PHYSICAL FOR MORE REFILLS) (Patient taking differently: Take 75 mg by mouth 2 (two) times daily. ) 270 tablet 4  . Multiple Vitamin (MULTIVITAMIN WITH MINERALS) TABS tablet Take 1 tablet by mouth every evening.    . Omega-3 Fatty Acids  (FISH OIL) 1200 MG CAPS Take 1,200 mg by mouth 2 (two) times daily.     . prochlorperazine (COMPAZINE) 10 MG tablet Take 1 tablet (10 mg total) by mouth every 6 (six) hours as needed (Nausea or vomiting). 30 tablet 1  . rosuvastatin (CRESTOR) 20 MG tablet TAKE 1 TABLET DAILY (PLEASE SCHEDULE A PHYSICAL FOR MORE REFILLS) (Patient taking differently: Take 20 mg by mouth every evening. ) 90 tablet 3  . traMADol (ULTRAM) 50 MG tablet Take 50 mg by mouth 3 (three) times daily as needed (pain).     . vitamin E (VITAMIN E) 400 UNIT capsule Takes one tablet by mouth daily. (Patient taking differently: Take 400 Units by mouth daily. Takes one tablet by mouth daily.)     No current facility-administered medications for this visit.     PHYSICAL EXAMINATION: ECOG PERFORMANCE STATUS: 1 - Symptomatic but completely ambulatory  Today's Vitals   07/17/18 1530 07/17/18 1537  BP: (!) 110/59   Pulse: 62   Resp: 18   Temp: 97.7 F (36.5 C)   TempSrc: Oral   SpO2: 100%   Weight: 202 lb 14.4 oz (92 kg)   Height: '5\' 9"'$  (1.753 m)   PainSc:  0-No pain   Body mass index is 29.96 kg/m.  Filed Weights   07/17/18 1530  Weight: 202 lb 14.4 oz (92 kg)    GENERAL: alert, no distress and comfortable SKIN: skin color, texture, turgor are normal, no rashes or significant lesions EYES: conjunctiva are pink and non-injected, sclera clear OROPHARYNX: no exudate, no erythema; lips, buccal mucosa, and tongue normal  NECK: supple, non-tender LYMPH:  no palpable lymphadenopathy in the cervical LUNGS: clear to auscultation with normal breathing effort HEART: regular rate & rhythm and no murmurs and no lower extremity edema ABDOMEN: soft, non-tender, non-distended, normal bowel sounds Musculoskeletal: no cyanosis of digits and no clubbing  PSYCH: alert & oriented x 3, fluent speech NEURO: no focal motor/sensory deficits  LABORATORY DATA:  I have reviewed the data as listed    Component Value Date/Time   NA 139  07/17/2018 1520   K 4.0 07/17/2018 1520   CL 103 07/17/2018 1520   CO2 26 07/17/2018 1520   GLUCOSE 234 (H) 07/17/2018 1520   BUN 16 07/17/2018 1520   CREATININE 0.94 07/17/2018 1520   CALCIUM 8.4 (L) 07/17/2018 1520   PROT 6.2 (L) 07/17/2018 1520   ALBUMIN 3.1 (L) 07/17/2018 1520   AST 18 07/17/2018 1520   ALT 24 07/17/2018  1520   ALKPHOS 56 07/17/2018 1520   BILITOT 0.6 07/17/2018 1520   GFRNONAA >60 07/17/2018 1520   GFRAA >60 07/17/2018 1520    No results found for: SPEP, UPEP  Lab Results  Component Value Date   WBC 6.1 07/17/2018   NEUTROABS 4.2 07/17/2018   HGB 12.4 (L) 07/17/2018   HCT 37.2 (L) 07/17/2018   MCV 92.8 07/17/2018   PLT 124 (L) 07/17/2018      Chemistry      Component Value Date/Time   NA 139 07/17/2018 1520   K 4.0 07/17/2018 1520   CL 103 07/17/2018 1520   CO2 26 07/17/2018 1520   BUN 16 07/17/2018 1520   CREATININE 0.94 07/17/2018 1520      Component Value Date/Time   CALCIUM 8.4 (L) 07/17/2018 1520   ALKPHOS 56 07/17/2018 1520   AST 18 07/17/2018 1520   ALT 24 07/17/2018 1520   BILITOT 0.6 07/17/2018 1520

## 2018-07-17 ENCOUNTER — Inpatient Hospital Stay: Payer: Medicare Other

## 2018-07-17 ENCOUNTER — Inpatient Hospital Stay (HOSPITAL_BASED_OUTPATIENT_CLINIC_OR_DEPARTMENT_OTHER): Payer: Medicare Other | Admitting: Hematology

## 2018-07-17 ENCOUNTER — Telehealth: Payer: Self-pay

## 2018-07-17 ENCOUNTER — Encounter: Payer: Self-pay | Admitting: Hematology

## 2018-07-17 ENCOUNTER — Other Ambulatory Visit: Payer: Self-pay

## 2018-07-17 ENCOUNTER — Telehealth: Payer: Self-pay | Admitting: Hematology

## 2018-07-17 VITALS — BP 110/59 | HR 62 | Temp 97.7°F | Resp 18 | Ht 69.0 in | Wt 202.9 lb

## 2018-07-17 DIAGNOSIS — C169 Malignant neoplasm of stomach, unspecified: Secondary | ICD-10-CM

## 2018-07-17 DIAGNOSIS — T451X5S Adverse effect of antineoplastic and immunosuppressive drugs, sequela: Secondary | ICD-10-CM

## 2018-07-17 DIAGNOSIS — Z794 Long term (current) use of insulin: Secondary | ICD-10-CM

## 2018-07-17 DIAGNOSIS — Z79899 Other long term (current) drug therapy: Secondary | ICD-10-CM

## 2018-07-17 DIAGNOSIS — C165 Malignant neoplasm of lesser curvature of stomach, unspecified: Secondary | ICD-10-CM

## 2018-07-17 DIAGNOSIS — D6481 Anemia due to antineoplastic chemotherapy: Secondary | ICD-10-CM

## 2018-07-17 DIAGNOSIS — Z7982 Long term (current) use of aspirin: Secondary | ICD-10-CM

## 2018-07-17 DIAGNOSIS — R5383 Other fatigue: Secondary | ICD-10-CM | POA: Diagnosis not present

## 2018-07-17 DIAGNOSIS — I700 Atherosclerosis of aorta: Secondary | ICD-10-CM

## 2018-07-17 DIAGNOSIS — D6959 Other secondary thrombocytopenia: Secondary | ICD-10-CM

## 2018-07-17 DIAGNOSIS — Z95828 Presence of other vascular implants and grafts: Secondary | ICD-10-CM

## 2018-07-17 DIAGNOSIS — T451X5A Adverse effect of antineoplastic and immunosuppressive drugs, initial encounter: Secondary | ICD-10-CM

## 2018-07-17 LAB — CBC WITH DIFFERENTIAL (CANCER CENTER ONLY)
Abs Immature Granulocytes: 0.01 10*3/uL (ref 0.00–0.07)
Basophils Absolute: 0 10*3/uL (ref 0.0–0.1)
Basophils Relative: 1 %
Eosinophils Absolute: 0.1 10*3/uL (ref 0.0–0.5)
Eosinophils Relative: 2 %
HCT: 37.2 % — ABNORMAL LOW (ref 39.0–52.0)
Hemoglobin: 12.4 g/dL — ABNORMAL LOW (ref 13.0–17.0)
Immature Granulocytes: 0 %
LYMPHS ABS: 1.1 10*3/uL (ref 0.7–4.0)
Lymphocytes Relative: 18 %
MCH: 30.9 pg (ref 26.0–34.0)
MCHC: 33.3 g/dL (ref 30.0–36.0)
MCV: 92.8 fL (ref 80.0–100.0)
Monocytes Absolute: 0.6 10*3/uL (ref 0.1–1.0)
Monocytes Relative: 10 %
Neutro Abs: 4.2 10*3/uL (ref 1.7–7.7)
Neutrophils Relative %: 69 %
Platelet Count: 124 10*3/uL — ABNORMAL LOW (ref 150–400)
RBC: 4.01 MIL/uL — ABNORMAL LOW (ref 4.22–5.81)
RDW: 16 % — ABNORMAL HIGH (ref 11.5–15.5)
WBC Count: 6.1 10*3/uL (ref 4.0–10.5)
nRBC: 0 % (ref 0.0–0.2)

## 2018-07-17 LAB — CMP (CANCER CENTER ONLY)
ALT: 24 U/L (ref 0–44)
AST: 18 U/L (ref 15–41)
Albumin: 3.1 g/dL — ABNORMAL LOW (ref 3.5–5.0)
Alkaline Phosphatase: 56 U/L (ref 38–126)
Anion gap: 10 (ref 5–15)
BUN: 16 mg/dL (ref 8–23)
CO2: 26 mmol/L (ref 22–32)
Calcium: 8.4 mg/dL — ABNORMAL LOW (ref 8.9–10.3)
Chloride: 103 mmol/L (ref 98–111)
Creatinine: 0.94 mg/dL (ref 0.61–1.24)
GFR, Est AFR Am: >60 mL/min (ref >60)
GFR, Estimated: >60 mL/min (ref >60)
Glucose, Bld: 234 mg/dL — ABNORMAL HIGH (ref 70–99)
Potassium: 4 mmol/L (ref 3.5–5.1)
Sodium: 139 mmol/L (ref 135–145)
Total Bilirubin: 0.6 mg/dL (ref 0.3–1.2)
Total Protein: 6.2 g/dL — ABNORMAL LOW (ref 6.5–8.1)

## 2018-07-17 MED ORDER — SODIUM CHLORIDE 0.9% FLUSH
10.0000 mL | Freq: Once | INTRAVENOUS | Status: AC
  Administered 2018-07-17: 10 mL
  Filled 2018-07-17: qty 10

## 2018-07-17 MED ORDER — HEPARIN SOD (PORK) LOCK FLUSH 100 UNIT/ML IV SOLN
500.0000 [IU] | Freq: Once | INTRAVENOUS | Status: AC
  Administered 2018-07-17: 500 [IU]
  Filled 2018-07-17: qty 5

## 2018-07-17 NOTE — Telephone Encounter (Signed)
The pt has been advised and will keep appt for Monday.  He is aware to call the office if he develops any fever, SOB or other respiratory symptoms.

## 2018-07-17 NOTE — Telephone Encounter (Signed)
Gave avs and calendar ° °

## 2018-07-17 NOTE — Telephone Encounter (Signed)
-----   Message from Irving Copas., MD sent at 07/17/2018  1:34 PM EDT ----- Regarding: RE: Upcoming Patient Procedure We have him on for Monday. I think it is reasonable to proceed in this regard as long as he is OK with this. Thank you for letting me know. Layney Gillson, please reach out to patient and let him know that we would like him to keep his upcoming procedure on Monday if he agrees as his referring providers and I feel that it is indicated to keep his oncologic care moving forward. Thanks. Gabe ----- Message ----- From: Stark Klein, MD Sent: 07/17/2018  10:35 AM EDT To: Grace Isaac, MD, # Subject: RE: Upcoming Patient Procedure                 I think the closer the EUS is to now will be the most instructive as he is getting chemotherapy and we would want it as close as possible to assist with treatment decisions.  I would consider it non elective at this point.   Fb   ----- Message ----- From: Irving Copas., MD Sent: 07/15/2018   5:07 PM EDT To: Stark Klein, MD, Grace Isaac, MD Subject: Upcoming Patient Procedure                     Dear Dr. Barry Dienes and Dr. Aggie Hacker hope that you are both doing well.This patient was placed on my list for EGD with EUS next week on Monday.Although we have not finalized the details there is a chance that we will be moving elective procedures out a few weeks.I am not sure exactly what the approach from the GI group will be here at [facility].However based on what the GI societies are suggesting is that further care of patients who are undergoing oncologic evaluation should continue moving forward as an urgent procedure.I just wanted to get both of you guys sense for this patient should our ability to perform elective procedures change. I think we will plan on moving forward next week unless you all feel uncomfortable with the patient coming into the hospital.Thank you for your help.Chester Holstein

## 2018-07-18 ENCOUNTER — Encounter (HOSPITAL_COMMUNITY): Payer: Self-pay | Admitting: *Deleted

## 2018-07-18 ENCOUNTER — Other Ambulatory Visit: Payer: Self-pay

## 2018-07-22 ENCOUNTER — Ambulatory Visit (HOSPITAL_COMMUNITY): Payer: Medicare Other | Admitting: Anesthesiology

## 2018-07-22 ENCOUNTER — Encounter (HOSPITAL_COMMUNITY): Payer: Self-pay | Admitting: *Deleted

## 2018-07-22 ENCOUNTER — Telehealth: Payer: Self-pay | Admitting: *Deleted

## 2018-07-22 ENCOUNTER — Ambulatory Visit (HOSPITAL_COMMUNITY)
Admission: RE | Admit: 2018-07-22 | Discharge: 2018-07-22 | Disposition: A | Discharge status: Home / Self Care | Payer: Medicare Other | Attending: Gastroenterology | Admitting: Gastroenterology

## 2018-07-22 ENCOUNTER — Other Ambulatory Visit: Payer: Self-pay | Admitting: Family Medicine

## 2018-07-22 ENCOUNTER — Encounter (HOSPITAL_COMMUNITY)
Admission: RE | Disposition: A | Discharge status: Home / Self Care | Payer: Self-pay | Source: Home / Self Care | Attending: Gastroenterology

## 2018-07-22 ENCOUNTER — Other Ambulatory Visit: Payer: Self-pay

## 2018-07-22 DIAGNOSIS — G4730 Sleep apnea, unspecified: Secondary | ICD-10-CM | POA: Insufficient documentation

## 2018-07-22 DIAGNOSIS — Z955 Presence of coronary angioplasty implant and graft: Secondary | ICD-10-CM | POA: Diagnosis not present

## 2018-07-22 DIAGNOSIS — K219 Gastro-esophageal reflux disease without esophagitis: Secondary | ICD-10-CM | POA: Insufficient documentation

## 2018-07-22 DIAGNOSIS — C169 Malignant neoplasm of stomach, unspecified: Secondary | ICD-10-CM

## 2018-07-22 DIAGNOSIS — I10 Essential (primary) hypertension: Secondary | ICD-10-CM | POA: Diagnosis not present

## 2018-07-22 DIAGNOSIS — E118 Type 2 diabetes mellitus with unspecified complications: Secondary | ICD-10-CM | POA: Diagnosis not present

## 2018-07-22 DIAGNOSIS — C160 Malignant neoplasm of cardia: Secondary | ICD-10-CM | POA: Diagnosis not present

## 2018-07-22 DIAGNOSIS — Z79899 Other long term (current) drug therapy: Secondary | ICD-10-CM | POA: Insufficient documentation

## 2018-07-22 DIAGNOSIS — I2510 Atherosclerotic heart disease of native coronary artery without angina pectoris: Secondary | ICD-10-CM | POA: Insufficient documentation

## 2018-07-22 DIAGNOSIS — K2210 Ulcer of esophagus without bleeding: Secondary | ICD-10-CM | POA: Insufficient documentation

## 2018-07-22 DIAGNOSIS — K2970 Gastritis, unspecified, without bleeding: Secondary | ICD-10-CM | POA: Insufficient documentation

## 2018-07-22 DIAGNOSIS — K3189 Other diseases of stomach and duodenum: Secondary | ICD-10-CM

## 2018-07-22 DIAGNOSIS — K2980 Duodenitis without bleeding: Secondary | ICD-10-CM | POA: Insufficient documentation

## 2018-07-22 DIAGNOSIS — Z794 Long term (current) use of insulin: Secondary | ICD-10-CM | POA: Diagnosis not present

## 2018-07-22 DIAGNOSIS — D490 Neoplasm of unspecified behavior of digestive system: Secondary | ICD-10-CM

## 2018-07-22 DIAGNOSIS — Z85828 Personal history of other malignant neoplasm of skin: Secondary | ICD-10-CM | POA: Insufficient documentation

## 2018-07-22 DIAGNOSIS — Z951 Presence of aortocoronary bypass graft: Secondary | ICD-10-CM | POA: Diagnosis not present

## 2018-07-22 HISTORY — PX: ESOPHAGOGASTRODUODENOSCOPY: SHX5428

## 2018-07-22 HISTORY — PX: FINE NEEDLE ASPIRATION: SHX5430

## 2018-07-22 HISTORY — PX: BIOPSY: SHX5522

## 2018-07-22 HISTORY — PX: EUS: SHX5427

## 2018-07-22 LAB — GLUCOSE, CAPILLARY: Glucose-Capillary: 176 mg/dL — ABNORMAL HIGH (ref 70–99)

## 2018-07-22 SURGERY — UPPER ENDOSCOPIC ULTRASOUND (EUS) RADIAL
Anesthesia: General

## 2018-07-22 MED ORDER — PROPOFOL 10 MG/ML IV BOLUS
INTRAVENOUS | Status: DC | PRN
  Administered 2018-07-22: 50 mg via INTRAVENOUS
  Administered 2018-07-22: 150 mg via INTRAVENOUS

## 2018-07-22 MED ORDER — PHENYLEPHRINE 40 MCG/ML (10ML) SYRINGE FOR IV PUSH (FOR BLOOD PRESSURE SUPPORT)
PREFILLED_SYRINGE | INTRAVENOUS | Status: DC | PRN
  Administered 2018-07-22: 120 ug via INTRAVENOUS
  Administered 2018-07-22: 80 ug via INTRAVENOUS

## 2018-07-22 MED ORDER — ONDANSETRON HCL 4 MG/2ML IJ SOLN
INTRAMUSCULAR | Status: DC | PRN
  Administered 2018-07-22: 4 mg via INTRAVENOUS

## 2018-07-22 MED ORDER — SODIUM CHLORIDE 0.9 % IV SOLN
INTRAVENOUS | Status: DC | PRN
  Administered 2018-07-22: 40 ug/min via INTRAVENOUS

## 2018-07-22 MED ORDER — HEPARIN SOD (PORK) LOCK FLUSH 100 UNIT/ML IV SOLN
500.0000 [IU] | INTRAVENOUS | Status: AC | PRN
  Administered 2018-07-22: 500 [IU]

## 2018-07-22 MED ORDER — DEXAMETHASONE SODIUM PHOSPHATE 10 MG/ML IJ SOLN
INTRAMUSCULAR | Status: DC | PRN
  Administered 2018-07-22: 4 mg via INTRAVENOUS

## 2018-07-22 MED ORDER — LIDOCAINE 2% (20 MG/ML) 5 ML SYRINGE
INTRAMUSCULAR | Status: DC | PRN
  Administered 2018-07-22: 100 mg via INTRAVENOUS

## 2018-07-22 MED ORDER — SODIUM CHLORIDE 0.9% FLUSH: 10.0000 mL | INTRAVENOUS | Status: DC | PRN

## 2018-07-22 MED ORDER — EPHEDRINE SULFATE 50 MG/ML IJ SOLN
INTRAMUSCULAR | Status: DC | PRN
  Administered 2018-07-22 (×2): 10 mg via INTRAVENOUS
  Administered 2018-07-22: 15 mg via INTRAVENOUS

## 2018-07-22 MED ORDER — OMEPRAZOLE 40 MG PO CPDR: 40.0000 mg | DELAYED_RELEASE_CAPSULE | Freq: Two times a day (BID) | ORAL | 4 refills | Status: DC

## 2018-07-22 MED ORDER — LACTATED RINGERS IV SOLN
INTRAVENOUS | Status: DC
  Administered 2018-07-22: 10:00:00 via INTRAVENOUS

## 2018-07-22 MED ORDER — SUCCINYLCHOLINE CHLORIDE 200 MG/10ML IV SOSY
PREFILLED_SYRINGE | INTRAVENOUS | Status: DC | PRN
  Administered 2018-07-22: 100 mg via INTRAVENOUS

## 2018-07-22 MED ORDER — SODIUM CHLORIDE 0.9 % IV SOLN: INTRAVENOUS | Status: DC

## 2018-07-22 NOTE — Anesthesia Procedure Notes (Signed)
Procedure Name: Intubation Date/Time: 07/22/2018 10:11 AM Performed by: Leonor Liv, CRNA Pre-anesthesia Checklist: Patient identified, Emergency Drugs available, Suction available and Patient being monitored Patient Re-evaluated:Patient Re-evaluated prior to induction Oxygen Delivery Method: Circle System Utilized Preoxygenation: Pre-oxygenation with 100% oxygen Induction Type: IV induction and Rapid sequence Laryngoscope Size: Mac and 4 Grade View: Grade I Tube type: Oral Tube size: 8.0 mm Number of attempts: 1 Airway Equipment and Method: Stylet and Oral airway Placement Confirmation: ETT inserted through vocal cords under direct vision,  positive ETCO2 and breath sounds checked- equal and bilateral Secured at: 23 cm Tube secured with: Tape Dental Injury: Teeth and Oropharynx as per pre-operative assessment

## 2018-07-22 NOTE — Telephone Encounter (Signed)
-----   Message from [name], MD sent at 07/17/2018  4:07 PM EDT ----- Can you let Mr. Bertoli know that his potassium level is normal and he can stop the Kcl tab?   Thanks.  [place]  ----- Message ----- From: Buel Ream, Lab In [place] Sent: 07/17/2018   3:31 PM EDT To: [name], MD

## 2018-07-22 NOTE — H&P (Signed)
GASTROENTEROLOGY OUTPATIENT PROCEDURE H&P NOTE   Primary Care Physician: Eulas Post, MD  HPI: Tristan [facility] is a 78 y.o. male who presents for EUS.  Past Medical History:  Diagnosis Date  . CAD 07/09/2009   Cath 12-02-10 90% left main, 90%LAD, 99% circumflex, 80% RCA  . Cancer ([place])    Stomach and esophagus. 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 Cataract    basal cell CA- left ear  . COLONIC POLYPS, HX OF 07/09/2009   Qualifier: Diagnosis of  By: Valma Cava LPN, [name]    . DIAB W/O COMP TYPE II/UNS NOT STATED UNCNTRL 07/09/2009   Qualifier: Diagnosis of  By: Joyce Gross    . GERD (gastroesophageal reflux disease)   . History of chicken pox   . HYPERLIPIDEMIA 07/09/2009   Qualifier: Diagnosis of  By: Valma Cava LPN, [name]    . HYPERTENSION 07/09/2009   Qualifier: Diagnosis of  By: Valma Cava LPN, [name]    . OBSTRUCTIVE SLEEP APNEA 07/09/2009   Qualifier: Diagnosis of  By: Joyce Gross    . Sleep apnea    no CPAP   Past Surgical History:  Procedure Laterality Date  . BIOPSY BREAST Left   . CHOLECYSTECTOMY    . COLONOSCOPY    . CORONARY ANGIOPLASTY WITH STENT PLACEMENT    . CORONARY ARTERY BYPASS GRAFT  8/12   CABG X 4 LIMA-LAD, SVG-PDB, SVG-OM/RAMUS   /  . EYE SURGERY Bilateral    catarct  . IR IMAGING GUIDED PORT INSERTION  05/15/2018  . TONSILLECTOMY     Current Facility-Administered Medications  Medication Dose Route Frequency Provider Last Rate Last Dose  . lactated ringers infusion   Intravenous Continuous Mansouraty, Telford Nab., MD      . sodium chloride flush (NS) 0.9 % injection 10-40 mL  10-40 mL Intracatheter PRN Mansouraty, Telford Nab., MD       Allergies  Allergen Reactions  . Naproxen Sodium Swelling   Family History  Problem Relation Age of Onset  . Hypertension Mother   . Heart disease Mother   . Stroke Mother   . Diabetes Mother   . Cancer Mother        pancreatic  . Pancreatic cancer Mother   . Heart disease Father   . Hypertension Father    . Colon cancer Maternal Uncle   . Esophageal cancer Neg Hx   . Stomach cancer Neg Hx   . Rectal cancer Neg Hx    Social History   Socioeconomic History  . Marital status: Married    Spouse name: Not on file  . Number of children: Not on file  . Years of education: Not on file  . Highest education level: Not on file  Occupational History  . Not on file  Social Needs  . Financial resource strain: Not on file  . Food insecurity:    Worry: Not on file    Inability: Not on file  . Transportation needs:    Medical: Not on file    Non-medical: Not on file  Tobacco Use  . Smoking status: Never Smoker  . Smokeless tobacco: Never Used  Substance and Sexual Activity  . Alcohol use: No  . Drug use: No  . Sexual activity: Not on file  Lifestyle  . Physical activity:    Days per week: Not on file    Minutes per session: Not on file  . Stress: Not on file  Relationships  . Social connections:  Talks on phone: Not on file    Gets together: Not on file    Attends religious service: Not on file    Active member of club or organization: Not on file    Attends meetings of clubs or organizations: Not on file    Relationship status: Not on file  . Intimate partner violence:    Fear of current or ex partner: Not on file    Emotionally abused: Not on file    Physically abused: Not on file    Forced sexual activity: Not on file  Other Topics Concern  . Not on file  Social History Narrative  . Not on file    Physical Exam: Vital signs in last 24 hours: Temp:  [97.8 F (36.6 C)] 97.8 F (36.6 C) (03/23 0906) Pulse Rate:  [60] 60 (03/23 0906) Resp:  [17] 17 (03/23 0906) BP: (119)/(55) 119/55 (03/23 0906) SpO2:  [99 %] 99 % (03/23 0906) Weight:  [92 kg] 92 kg (03/23 0906)   GEN: NAD EYE: Sclerae anicteric ENT: MMM CV: RR without R/Gs  RESP: CTAB posteriorly GI: Soft, NT/ND NEURO:  Alert & Oriented x 3  Lab Results: No results for input(s): WBC, HGB, HCT, PLT in the  last 72 hours. BMET No results for input(s): NA, K, CL, CO2, GLUCOSE, BUN, CREATININE, CALCIUM in the last 72 hours. LFT No results for input(s): PROT, ALBUMIN, AST, ALT, ALKPHOS, BILITOT, BILIDIR, IBILI in the last 72 hours. PT/INR No results for input(s): LABPROT, INR in the last 72 hours.   Impression / Plan: This is a 78 y.o.male who presents for EUS.  The risks and benefits of endoscopic evaluation were discussed with the patient; these include but are not limited to the risk of perforation, infection, bleeding, missed lesions, lack of diagnosis, severe illness requiring hospitalization, as well as anesthesia and sedation related illnesses.  The patient is agreeable to proceed.    Justice Britain, MD [facility]oscopy Office # [phone]

## 2018-07-22 NOTE — Anesthesia Postprocedure Evaluation (Signed)
Anesthesia Post Note  Patient: BRIYAN KLEVEN  Procedure(s) Performed: UPPER ENDOSCOPIC ULTRASOUND (EUS) RADIAL (N/A ) BIOPSY ESOPHAGOGASTRODUODENOSCOPY (EGD) (N/A ) FINE NEEDLE ASPIRATION (FNA) LINEAR UPPER ENDOSCOPIC ULTRASOUND (EUS) LINEAR     Patient location during evaluation: PACU Anesthesia Type: General Level of consciousness: awake and alert Pain management: pain level controlled Vital Signs Assessment: post-procedure vital signs reviewed and stable Respiratory status: spontaneous breathing, nonlabored ventilation and respiratory function stable Cardiovascular status: blood pressure returned to baseline and stable Postop Assessment: no apparent nausea or vomiting Anesthetic complications: no    Last Vitals:  Vitals:   07/22/18 1213 07/22/18 1223  BP: (!) 110/46 (!) 115/48  Pulse: 66 67  Resp: 10 15  Temp:    SpO2: 99% 100%    Last Pain:  Vitals:   07/22/18 1143  TempSrc: Oral  PainSc:                  Lidia Collum

## 2018-07-22 NOTE — Anesthesia Preprocedure Evaluation (Signed)
Anesthesia Evaluation  Patient identified by MRN, date of birth, ID band Patient awake    Reviewed: Allergy & Precautions, NPO status , Patient's Chart, lab work & pertinent test results, reviewed documented beta blocker date and time   History of Anesthesia Complications Negative for: history of anesthetic complications  Airway Mallampati: III  TM Distance: >3 FB Neck ROM: Full    Dental no notable dental hx. (+) Teeth Intact   Pulmonary sleep apnea ,    Pulmonary exam normal        Cardiovascular hypertension, Pt. on medications and Pt. on home beta blockers + CAD, + Past MI, + Cardiac Stents and + CABG  Normal cardiovascular exam     Neuro/Psych negative neurological ROS  negative psych ROS   GI/Hepatic Neg liver ROS, GERD  ,  Endo/Other  diabetes, Type 2, Insulin Dependent  Renal/GU negative Renal ROS  negative genitourinary   Musculoskeletal negative musculoskeletal ROS (+)   Abdominal   Peds  Hematology negative hematology ROS (+)   Anesthesia Other Findings   Reproductive/Obstetrics                             Anesthesia Physical Anesthesia Plan  ASA: III  Anesthesia Plan: General   Post-op Pain Management:    Induction: Intravenous and Rapid sequence  PONV Risk Score and Plan: 2 and Ondansetron, Dexamethasone and Treatment may vary due to age or medical condition  Airway Management Planned: Oral ETT  Additional Equipment: None  Intra-op Plan:   Post-operative Plan: Extubation in OR  Informed Consent: I have reviewed the patients History and Physical, chart, labs and discussed the procedure including the risks, benefits and alternatives for the proposed anesthesia with the patient or authorized representative who has indicated his/her understanding and acceptance.     Dental advisory given  Plan Discussed with: CRNA and Anesthesiologist  Anesthesia Plan  Comments:         Anesthesia Quick Evaluation

## 2018-07-22 NOTE — Transfer of Care (Signed)
Immediate Anesthesia Transfer of Care Note  Patient: Tristan Hughes  Procedure(s) Performed: UPPER ENDOSCOPIC ULTRASOUND (EUS) RADIAL (N/A ) BIOPSY ESOPHAGOGASTRODUODENOSCOPY (EGD) (N/A ) FINE NEEDLE ASPIRATION (FNA) LINEAR UPPER ENDOSCOPIC ULTRASOUND (EUS) LINEAR  Patient Location: Endoscopy Unit  Anesthesia Type:General  Level of Consciousness: sedated  Airway & Oxygen Therapy: Patient Spontanous Breathing and Patient connected to nasal cannula oxygen  Post-op Assessment: Report given to RN and Post -op Vital signs reviewed and stable  Post vital signs: Reviewed and stable  Last Vitals:  Vitals Value Taken Time  BP    Temp    Pulse 67 07/22/2018 11:43 AM  Resp 16 07/22/2018 11:43 AM  SpO2 100 % 07/22/2018 11:43 AM  Vitals shown include unvalidated device data.  Last Pain:  Vitals:   07/22/18 0906  TempSrc: Oral  PainSc: 0-No pain         Complications: No apparent anesthesia complications

## 2018-07-22 NOTE — Op Note (Addendum)
[facility] Patient Name: Tristan Hughes Procedure Date : 07/22/2018 MRN: [id number] Attending MD: Justice Britain , MD Date of Birth: 1940/11/28 CSN: [id number] Age: 78 Admit Type: Inpatient Procedure:                Upper EUS Indications:              Staging of gastric adenocarcinoma Providers:                Justice Britain, MD, Glori Bickers, RN, Carlyn Reichert, RN, Charolette Child, Technician, Trixie Deis,                            CRNA Referring MD:             Stark Klein MD, Lilia Argue Servando Snare MD, [name] MD Medicines:                General Anesthesia Complications:            No immediate complications. Estimated Blood Loss:     Estimated blood loss was minimal. Procedure:                Pre-Anesthesia Assessment:                           - Prior to the procedure, a History and Physical                            was performed, and patient medications and                            allergies were reviewed. The patient's tolerance of                            previous anesthesia was also reviewed. The risks                            and benefits of the procedure and the sedation                            options and risks were discussed with the patient.                            All questions were answered, and informed consent                            was obtained. Prior Anticoagulants: The patient has                            taken aspirin. ASA Grade Assessment: III - A                            patient with severe systemic disease. After  reviewing the risks and benefits, the patient was                            deemed in satisfactory condition to undergo the                            procedure.                           After obtaining informed consent, the endoscope was                            passed under direct vision. Throughout the                            procedure, the  patient's blood pressure, pulse, and                            oxygen saturations were monitored continuously. The                            [id number]-H190 ([id number]) Olympus gastroscope was                            introduced through the mouth, and advanced to the                            second part of duodenum. The GF-UE160-AL5 ([id number])                            Olympus Radial EUS scope was introduced through the                            mouth, and advanced to the stomach for ultrasound                            examination from the esophagus and stomach. The                            GF-[id number] ([id number]) Olympus Linear EUS scope was                            introduced through the mouth, and advanced to the                            stomach for ultrasound examination. The upper EUS                            was accomplished without difficulty. The patient                            tolerated the procedure. Scope In: Scope Out: Findings:      ENDOSCOPIC FINDING: :      No gross lesions were noted in the  proximal esophagus and in the mid       esophagus. The Z-line was at 43.5 cm.      One linear esophageal ulcerated though healing region was found 41 to       42.5 cm from the incisors. After EUS had been completed, biopsies were       taken with a cold forceps for histology purposes.      A large, infiltrative and ulcerated, partially circumferential       (involving one-third of the lumen circumference) mass with no bleeding       and stigmata of recent bleeding was found at the cardia with extension       to the gastroesophageal junction and the above noted healing ulceration       in the very distal esophagus.      Patchy moderately erythematous mucosa was found in the gastric body, at       the incisura and in the gastric antrum. Biopsies were taken with a cold       forceps for histology and Helicobacter pylori testing.      One non-bleeding cratered gastric ulcer was  found at the pylorus. The       lesion was 13 mm in largest dimension. Biopsies were taken with a cold       forceps for histology.      Patchy moderately erythematous mucosa without active bleeding and with       no stigmata of bleeding was found in the duodenal bulb and in the D1/D2       sweep.      No gross lesions were noted in the second portion of the duodenum.      A large diverticulum was found in the area of the papilla.      ENDOSONOGRAPHIC FINDING: :      A hypoechoic non-circumferential mass was identified endosonographically       in the cardia of the stomach. The mass begins at the GE junction 42.5       cm. The Z-line is at 43.5 cm. The distal extent of the tumor is at 49       cm. The endosonographic borders were well-defined. There was sonographic       evidence suggesting invasion into the muscularis propria (Layer 4).      One malignant-appearing lymph node was visualized in the peripancreatic       region with the ultrasound probe located 50 cm from the incisors. It       measured 10.3 mm by 7.1 mm in maximal cross-sectional diameter. The node       was round, hypoechoic and had well defined margins. Fine needle biopsy       was performed. Color Doppler imaging was utilized prior to needle       puncture to confirm a lack of significant vascular structures within the       needle path. Six passes were made with the 25 gauge ultrasound core       biopsy needle using a transgastric approach. Visible cores of tissue       were obtained. Preliminary cytologic examination and touch preps were       performed. Final cytology results are pending.      Three enlarged lymph nodes were visualized in the perigastric region       with the ultrasound probe located at 41 cm and 45 cm from the incisors.  The lymph node at 45.5 cm measured 7.7 mm by 5.4 mm in maximal       cross-sectional diameter. The lymph nodes at 41 cm measured 1.8 mm by       3.0 mm and 3.4 mm by 3.0 mm.  The nodes were round, hypoechoic and had       well defined margins. Sampling was not performed as it would require       going through the tumor to attempt to obtain tissue.      Endosonographic imaging in the visualized portion of the liver showed no       mass.      The celiac region was visualized. Impression:               EGD Impression:                           - No gross lesions in proximal/middle esophagus.                           - Healing esophageal ulceration noted at very                            distal esophagus. Biopsied.                           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                           - Erythematous mucosa in the gastric body, incisura                            and antrum. Biopsied for HP.                           - Non-bleeding gastric ulcer. Biopsied.                           - Erythematous duodenopathy. .                           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                           EUS Impression:                           - A mass was found in the cardia with extension                            into the stomach. A tissue diagnosis was obtained                            prior to this exam. This is previously biopsied as  adenocarcinoma. This was staged at least T2 N1 Mx                            by endosonographic criteria and if the lymph node                            is returns positive otherwise would                           - A mass was found in the gastroesophageal                            junction. The diagnosis is adenocarcinoma.                           - One malignant-appearing lymph node was visualized                            in the peripancreatic region. Cytology results are                            pending. 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                            Fine needle biopsy performed.                           - Three enlarged lymph nodes were visualized in the                            perigastric region. Recommendation:           - The patient will be observed post-procedure,                            until all discharge criteria are met.                           - Discharge patient to home.                           - Patient has a contact number available for                            emergencies. The signs and symptoms of potential                            delayed complications were discussed with the                            patient. Return to normal activities tomorrow.  Written discharge instructions were provided to the                            patient.                           - Resume previous diet.                           - Observe patient's clinical course.                           - Await cytology results and await path results.                           - Start Omeprazole 40 mg BID.                           - Move forward with cross-sectional imaging that is                            already scheduled.                           - The findings and recommendations were discussed                            with the patient.                           - The findings and recommendations were discussed                            with the patient's family. Procedure Code(s):        --- Professional ---                           [phone], Esophagogastroduodenoscopy, flexible,                            transoral; with transendoscopic ultrasound-guided                            intramural or transmural fine needle                            aspiration/biopsy(s), (includes endoscopic                            ultrasound examination limited to the esophagus,                            stomach or duodenum, and adjacent  structures) Diagnosis Code(s):        --- Professional ---                           K22.10, Ulcer of esophagus without bleeding  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.0, Malignant neoplasm of cardia                           K31.89, Other diseases of stomach and duodenum                           K25.9, Gastric ulcer, unspecified as acute or                            chronic, without hemorrhage or perforation                           I89.9, Noninfective disorder of lymphatic vessels                            and lymph nodes, unspecified                           R59.0, Localized enlarged lymph nodes                           K57.10, Diverticulosis of small intestine without                            perforation or abscess without bleeding CPT copyright 2018 [employer]. All rights reserved. The codes documented in this report are preliminary and upon coder review may  be revised to meet current compliance requirements. Justice Britain, MD 07/22/2018 12:27:19 PM Number of Addenda: 0

## 2018-07-22 NOTE — Discharge Instructions (Signed)
YOU HAD AN ENDOSCOPIC PROCEDURE TODAY: Refer to the procedure report and other information in the discharge instructions given to you for any specific questions about what was found during the examination. If this information does not answer your questions, please call [place] office at [phone] to clarify.   YOU SHOULD EXPECT: Some feelings of bloating in the abdomen. Passage of more gas than usual. Walking can help get rid of the air that was put into your GI tract during the procedure and reduce the bloating. If you had a lower endoscopy (such as a colonoscopy or flexible sigmoidoscopy) you may notice spotting of blood in your stool or on the toilet paper. Some abdominal soreness may be present for a day or two, also.  DIET: Your first meal following the procedure should be a light meal and then it is ok to progress to your normal diet. A half-sandwich or bowl of soup is an example of a good first meal. Heavy or fried foods are harder to digest and may make you feel nauseous or bloated. Drink plenty of fluids but you should avoid alcoholic beverages for 24 hours. If you had a esophageal dilation, please see attached instructions for diet.    ACTIVITY: Your care partner should take you home directly after the procedure. You should plan to take it easy, moving slowly for the rest of the day. You can resume normal activity the day after the procedure however YOU SHOULD NOT DRIVE, use power tools, machinery or perform tasks that involve climbing or major physical exertion for 24 hours (because of the sedation medicines used during the test).   SYMPTOMS TO REPORT IMMEDIATELY: A gastroenterologist can be reached at any hour. Please call [phone]  for any of the following symptoms:   Following upper endoscopy (EGD, EUS, ERCP, esophageal dilation) Vomiting of blood or coffee ground material  New, significant abdominal pain  New, significant chest pain or pain under the shoulder blades  Painful or  persistently difficult swallowing  New shortness of breath  Black, tarry-looking or red, bloody stools  FOLLOW UP:  If any biopsies were taken you will be contacted by phone or by letter within the next 1-3 weeks. Call [phone]  if you have not heard about the biopsies in 3 weeks.  Please also call with any specific questions about appointments or follow up tests.  

## 2018-07-22 NOTE — Telephone Encounter (Signed)
TCT patient regarding lab results from last week. Spoke with pt's wife and advised her that her husband's K+ was normal last week and he can stop taking the KCL tablets. Wife voiced understanding.

## 2018-07-23 ENCOUNTER — Encounter (HOSPITAL_COMMUNITY): Payer: Self-pay | Admitting: Gastroenterology

## 2018-07-25 ENCOUNTER — Ambulatory Visit: Payer: Medicare Other | Admitting: Orthotics

## 2018-07-25 ENCOUNTER — Other Ambulatory Visit: Payer: Self-pay | Admitting: Hematology

## 2018-07-25 DIAGNOSIS — D3A8 Other benign neuroendocrine tumors: Secondary | ICD-10-CM

## 2018-07-29 ENCOUNTER — Other Ambulatory Visit: Payer: Self-pay

## 2018-07-29 ENCOUNTER — Ambulatory Visit (HOSPITAL_COMMUNITY)
Admission: RE | Admit: 2018-07-29 | Discharge: 2018-07-29 | Disposition: A | Discharge status: Home / Self Care | Payer: Medicare Other | Source: Ambulatory Visit | Attending: Hematology | Admitting: Hematology

## 2018-07-29 ENCOUNTER — Telehealth: Payer: Self-pay | Admitting: Gastroenterology

## 2018-07-29 ENCOUNTER — Encounter (HOSPITAL_COMMUNITY): Payer: Self-pay

## 2018-07-29 ENCOUNTER — Telehealth: Payer: Self-pay | Admitting: *Deleted

## 2018-07-29 ENCOUNTER — Encounter: Payer: Self-pay | Admitting: Gastroenterology

## 2018-07-29 DIAGNOSIS — C169 Malignant neoplasm of stomach, unspecified: Secondary | ICD-10-CM

## 2018-07-29 DIAGNOSIS — I2510 Atherosclerotic heart disease of native coronary artery without angina pectoris: Secondary | ICD-10-CM | POA: Diagnosis not present

## 2018-07-29 LAB — GLUCOSE, CAPILLARY
Glucose-Capillary: 25 mg/dL — CL (ref 70–99)
Glucose-Capillary: 36 mg/dL — CL (ref 70–99)
Glucose-Capillary: 49 mg/dL — ABNORMAL LOW (ref 70–99)
Glucose-Capillary: 68 mg/dL — ABNORMAL LOW (ref 70–99)
Glucose-Capillary: 107 mg/dL — ABNORMAL HIGH (ref 70–99)
Glucose-Capillary: 119 mg/dL — ABNORMAL HIGH (ref 70–99)

## 2018-07-29 MED ORDER — SODIUM CHLORIDE (PF) 0.9 % IJ SOLN
INTRAMUSCULAR | Status: AC
  Filled 2018-07-29: qty 50

## 2018-07-29 MED ORDER — IOHEXOL 300 MG/ML  SOLN
30.0000 mL | Freq: Once | INTRAMUSCULAR | Status: AC | PRN
  Administered 2018-07-29: 30 mL via ORAL

## 2018-07-29 MED ORDER — IOHEXOL 300 MG/ML  SOLN
100.0000 mL | Freq: Once | INTRAMUSCULAR | Status: AC | PRN
  Administered 2018-07-29: 100 mL via INTRAVENOUS

## 2018-07-29 MED ORDER — HEPARIN SOD (PORK) LOCK FLUSH 100 UNIT/ML IV SOLN
INTRAVENOUS | Status: AC
  Filled 2018-07-29: qty 5

## 2018-07-29 NOTE — Sedation Documentation (Signed)
CRITICAL VALUE ALERT  Critical Value:  25  Date & Time Notied:  07/29/2018 1005  Provider Notified: Dr Maylon Peppers  Orders Received/Actions taken: Continue standing orders for hypoglycemia.

## 2018-07-29 NOTE — Sedation Documentation (Signed)
Patient's CBG 25 at 0945. Patient reported "slightly shaky" and stated he was NPO this a.m. for CT and gave himself 15 units of short-acting insulin this morning. Also stated he took his Lantus last night.  8 oz apple juice given. CBG 36 at 1000. 8 oz apple juice given again. Dr Lorette Ang nurse, Stanton Kidney, notified of above via TC. CBG 49 at 1015. Patient reports "I'm feeling better." 7.5 oz can Sprite given to patient. CBG 68 at 1030. 8 oz apple juice given. CBG 107 at 1045. Patient with no complaints. CBG 119 at 1115 prior to patient leaving CT. RN Stanton Kidney notified via TC of above.

## 2018-07-29 NOTE — Telephone Encounter (Signed)
Pt at CT, BS 25 retaken BS 36. Pt being given apple juice, BS will be repeated after test. CT will call back with additional results for further instructions. Pt was not able to eat due to contrast and took insulin this am. MD aware.

## 2018-07-29 NOTE — Telephone Encounter (Signed)
This is documentation from 07/25/2018  I called and spoke with the patient and his wife about the results of the biopsies. I had discussed his case with his primary oncologist as well. 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 It is possible that this could have been an intraparenchymal pancreatic neuroendocrine that was sampled though I cannot recall of the pancreas looking abnormal in echotexture and this rather being a single lesion outside.  With that being said he has cross-sectional imaging that will be performed in the next week on 3/30. This will help dictate for [place] what we may be seeing in the region. After talking with oncology he may also require a dotatate scan. I will discuss his case at an upcoming [facility] for next steps in evaluation including dotatate scan if we are not able to further delineate things with his cross-sectional CT chest abdomen pelvis. Patient and wife appreciative for call back.  Justice Britain, MD [facility] Office # [phone]

## 2018-08-01 ENCOUNTER — Telehealth: Payer: Self-pay | Admitting: Cardiothoracic Surgery

## 2018-08-01 ENCOUNTER — Ambulatory Visit (INDEPENDENT_AMBULATORY_CARE_PROVIDER_SITE_OTHER): Payer: Medicare Other | Admitting: Cardiothoracic Surgery

## 2018-08-01 DIAGNOSIS — C169 Malignant neoplasm of stomach, unspecified: Secondary | ICD-10-CM | POA: Diagnosis not present

## 2018-08-01 NOTE — Progress Notes (Signed)
[address]       [place] [address]             [phone]                    Shloimy N Alemu Minden Medical Record #[id number] Date of Birth: [birth year]  Referring: Eulas Post, MD Primary Care: Eulas Post, MD Primary Cardiologist: No primary care provider on file.  Chief Complaint:    Gastric cancer   History of Present Illness:    Tristan Hughes 78 y.o. 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  The patient noted the onset of upper abdominal and gastric discomfort in October 2019.  Initially tried Tums and acid blockers without much effect.  Upper GI endoscopy was done with nonbleeding ulceration involving the lesser curvature of the stomach.  The patient has started on FOLFOX for which he has had 3 cycles.  He has not had radiation.  He has been referred by Dr. Barry Dienes, in case surgical resection would require an Ivor Bobby Rumpf, with anastomosis in the right chest, if an adequate gastric esophageal margin cannot be obtained from the abdomen.   Patient completed preoperative chemotherapy on March 6.  He notes since then he has been relatively asymptomatic.  Taking a p.o. diet without difficulty maintaining his weight at a stable 197 pounds.  He has had no swallowing difficulties   Patient has a previous cardiac history having had coronary artery bypass grafting in 2012, while living in [place], he is currently followed from a cardiac standpoint by Dr. Bettina Gavia.  Patient is a lifelong non-smoker, does not drink alcohol, he retired from [employer].   Family history and assessment for his mother who died age 48 of pancreatic cancer   Diagnosis initial biopsy of the stomach mass showed Surgical [P], GE junction-ulcerated lesion - POORLY DIFFERENTIATED ADENOCARCINOMA. - SEE COMMENT. Microscopic Comment The features slightly favor a gastric primary  rather than an esophageal primary. Dr. Vicente Males has reviewed the case and concurs with this interpretation. Dr. Fuller Plan was paged on 04/26/2018. (JBK:ah 04/26/18) Enid Cutter MD   Current Activity/ Functional Status:  Patient is independent with mobility/ambulation, transfers, ADL's, IADL's.   Zubrod Score: At the time of surgery this patients most appropriate activity status/level should be described as: []     0    Normal activity, no symptoms [x]     1    Restricted in physical strenuous activity but ambulatory, able to do out light work []     2    Ambulatory and capable of self care, unable to do work activities, up and about               >50 % of waking hours                              []     3    Only limited self care, in bed greater than 50% of waking hours []     4    Completely disabled, no self care, confined to bed or chair []     5    Moribund   Past Medical History:  Diagnosis Date   CAD 07/09/2009   Cath 12-02-10 90% left main, 90%LAD, 99% circumflex, 80% RCA   Cancer ([place])    Stomach and  esophagus. 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   Cataract    basal cell CA- left ear   COLONIC POLYPS, HX OF 07/09/2009   Qualifier: Diagnosis of  By: Valma Cava LPN, [name]     DIAB W/O COMP TYPE II/UNS NOT STATED UNCNTRL 07/09/2009   Qualifier: Diagnosis of  By: Joyce Gross     GERD (gastroesophageal reflux disease)    History of chicken pox    HYPERLIPIDEMIA 07/09/2009   Qualifier: Diagnosis of  By: Valma Cava LPN, [name]     HYPERTENSION 07/09/2009   Qualifier: Diagnosis of  By: Valma Cava LPN, Nancy     OBSTRUCTIVE SLEEP APNEA 07/09/2009   Qualifier: Diagnosis of  By: Joyce Gross     Sleep apnea    no CPAP    Past Surgical History:  Procedure Laterality Date   BIOPSY  07/22/2018   Procedure: BIOPSY;  Surgeon: Irving Copas., MD;  Location: [facility];  Service: Endoscopy;;   BIOPSY BREAST Left    CHOLECYSTECTOMY     COLONOSCOPY     CORONARY ANGIOPLASTY  WITH STENT PLACEMENT     CORONARY ARTERY BYPASS GRAFT  8/12   CABG X 4 LIMA-LAD, SVG-PDB, SVG-OM/RAMUS   /   ESOPHAGOGASTRODUODENOSCOPY N/A 07/22/2018   Procedure: ESOPHAGOGASTRODUODENOSCOPY (EGD);  Surgeon: Rush Landmark Telford Nab., MD;  Location: [facility];  Service: Endoscopy;  Laterality: N/A;   EUS N/A 07/22/2018   Procedure: UPPER ENDOSCOPIC ULTRASOUND (EUS) RADIAL;  Surgeon: Rush Landmark Telford Nab., MD;  Location: [facility];  Service: Endoscopy;  Laterality: N/A;   EUS  07/22/2018   Procedure: UPPER ENDOSCOPIC ULTRASOUND (EUS) LINEAR;  Surgeon: Irving Copas., MD;  Location: [facility];  Service: Endoscopy;;   EYE SURGERY Bilateral    catarct   FINE NEEDLE ASPIRATION  07/22/2018   Procedure: FINE NEEDLE ASPIRATION (FNA) LINEAR;  Surgeon: Irving Copas., MD;  Location: [facility];  Service: Endoscopy;;   IR IMAGING GUIDED PORT INSERTION  05/15/2018   TONSILLECTOMY      Family History  Problem Relation Age of Onset   Hypertension Mother    Heart disease Mother    Stroke Mother    Diabetes Mother    Cancer Mother        pancreatic   Pancreatic cancer Mother    Heart disease Father    Hypertension Father    Colon cancer Maternal Uncle    Esophageal cancer Neg Hx    Stomach cancer Neg Hx    Rectal cancer Neg Hx    Patient has 8 children, all healthy, one grandchild with Down syndrome and esophageal atresia, patient's mother died at age 30 with pancreatic cancer   Social History   Tobacco Use  Smoking Status Never Smoker  Smokeless Tobacco Never Used    Social History   Substance and Sexual Activity  Alcohol Use No     Allergies  Allergen Reactions   Naproxen Sodium Swelling    Current Outpatient Medications  Medication Sig Dispense Refill   amLODipine (NORVASC) 5 MG tablet TAKE 1 TABLET DAILY (PLEASE SCHEDULE A PHYSICAL FOR MORE REFILLS) (Patient taking differently: Take 5 mg by mouth every evening. ) 90 tablet 1     Ascorbic Acid (VITAMIN C) 1000 MG tablet Take 1,000 mg by mouth every evening.      aspirin EC 81 MG tablet Take 81 mg by mouth every evening.     b complex vitamins tablet Take 1 tablet by mouth every evening.      calcium carbonate (  TUMS - DOSED IN MG ELEMENTAL CALCIUM) 500 MG chewable tablet Chew 1-2 tablets by mouth 2 (two) times daily as needed (acid reflux/indigestion.).      Cholecalciferol (VITAMIN D-3) 125 MCG (5000 UT) TABS Take 5,000 Units by mouth daily.     dexamethasone (DECADRON) 4 MG tablet Take 2 tablets (8 mg total) by mouth daily. Start the day after chemotherapy for 2 days. Take with food. (Patient taking differently: Take 8 mg by mouth See admin instructions. Start the day after chemotherapy for 2 days. Take with food.) 30 tablet 1   ezetimibe (ZETIA) 10 MG tablet TAKE 1 TABLET DAILY (Patient taking differently: Take 10 mg by mouth daily. ) 90 tablet 2   furosemide (LASIX) 20 MG tablet TAKE 1 TABLET TWICE A DAY (Patient taking differently: Take 20 mg by mouth 2 (two) times daily. ) 180 tablet 3   [place] 300 MG CAPS Take 300 mg by mouth 2 (two) times daily.     insulin lispro (HUMALOG) 100 UNIT/ML injection Inject 0.15-0.18 mLs (15-18 Units total) into the skin 3 (three) times daily before meals. 50 mL 2   Insulin Syringe-Needle U-100 (B-D INS SYR HALF-UNIT .3CC/31G) 31G X 5/16" 0.3 ML MISC by Does not apply route.     Insulin Syringe-Needle U-100 31G X 5/16" 1 ML MISC Use once daily 100 each 3   LANTUS 100 UNIT/ML injection INJECT 46 UNITS UNDER THE SKIN AT BEDTIME (Patient taking differently: Inject 46 Units into the skin at bedtime. ) 50 mL 5   lidocaine-prilocaine (EMLA) cream Apply to affected area once (Patient taking differently: Apply 1 application topically daily as needed (prior to port being accessed.). ) 30 g 3   lisinopril (PRINIVIL,ZESTRIL) 20 MG tablet Take 10 mg by mouth daily.     LORazepam (ATIVAN) 0.5 MG tablet Take 1 tablet (0.5 mg  total) by mouth every 6 (six) hours as needed (Nausea or vomiting). 30 tablet 0   metFORMIN (GLUCOPHAGE) 1000 MG tablet TAKE 1 TABLET TWICE A DAY WITH MEALS (Patient taking differently: Take 1,000 mg by mouth 2 (two) times daily. ) 180 tablet 4   metoprolol tartrate (LOPRESSOR) 50 MG tablet TAKE ONE AND ONE-HALF TABLETS TWICE A DAY (SCHEDULE A PHYSICAL FOR MORE REFILLS) (Patient taking differently: Take 75 mg by mouth 2 (two) times daily. ) 270 tablet 4   Multiple Vitamin (MULTIVITAMIN WITH MINERALS) TABS tablet Take 1 tablet by mouth every evening.     mupirocin ointment (BACTROBAN) 2 % Place 1 application into the nose 2 (two) times daily. Apply to left second toe once daily (Patient taking differently: Apply 1 application topically once. Apply to left second toe once daily) 22 g 0   Omega-3 Fatty Acids (FISH OIL) 1200 MG CAPS Take 1,200 mg by mouth 2 (two) times daily.      omeprazole (PRILOSEC) 40 MG capsule Take 1 capsule (40 mg total) by mouth 2 (two) times daily. 60 capsule 4   prochlorperazine (COMPAZINE) 10 MG tablet Take 1 tablet (10 mg total) by mouth every 6 (six) hours as needed (Nausea or vomiting). 30 tablet 1   rosuvastatin (CRESTOR) 20 MG tablet TAKE 1 TABLET DAILY (PLEASE SCHEDULE A PHYSICAL FOR MORE REFILLS) (Patient taking differently: Take 20 mg by mouth every evening. ) 90 tablet 3   traMADol (ULTRAM) 50 MG tablet Take 50 mg by mouth 3 (three) times daily as needed (pain).      vitamin E (VITAMIN E) 400 UNIT capsule Takes one  tablet by mouth daily. (Patient taking differently: Take 400 Units by mouth daily. Takes one tablet by mouth daily.)     No current facility-administered medications for this visit.     Pertinent items are noted in HPI.   Review of Systems:     Cardiac Review of Systems: [Y] = yes  or   [ N ] = no   Chest Pain [  N ]  Resting SOB [  N] Exertional SOB  [ Y ]  Orthopnea Aqua.Slicker  ]   Pedal Edema Aqua.Slicker  ]    Palpitations Aqua.Slicker ] Syncope  [ N ]   Presyncope  Aqua.Slicker  ]   General Review of Systems: [Y] = yes [  ]=no Constitional: recent weight change [  ];  Wt loss over the last 3 months [   ] anorexia [  ]; fatigue [  ]; nausea [  ]; night sweats [  ]; fever [  ]; or chills [  ];           Eye : blurred vision [  ]; diplopia [   ]; vision changes [  ];  Amaurosis fugax[  ]; Resp: cough [  ];  wheezing[  ];  hemoptysis[  ]; shortness of breath[  ]; paroxysmal nocturnal dyspnea[  ]; dyspnea on exertion[  ]; or orthopnea[  ];  GI:  gallstones[  ], vomiting[  ];  dysphagia[  ]; melena[  ];  hematochezia [  ]; heartburn[  ];   Hx of  Colonoscopy[  ]; GU: kidney stones [  ]; hematuria[  ];   dysuria [  ];  nocturia[  ];  history of     obstruction [  ]; urinary frequency [ y ]             Skin: rash, swelling[  ];, hair loss[  ];  peripheral edema[  ];  or itching[  ]; Musculosketetal: myalgias[  ];  joint swelling[  ];  joint erythema[  ];  joint pain[  ];  back pain[  ];  Heme/Lymph: bruising[  ];  bleeding[  ];  anemia[  ];  Neuro: TIA[  ];  headaches[  ];  stroke[  ];  vertigo[  ];  seizures[  ];   paresthesias[  ];  difficulty walking[  ];  Psych:depression[  ]; anxiety[  ];  Endocrine: diabetes[  ];  thyroid dysfunction[  ];  Immunizations: Flu up to date [  ]; Pneumococcal up to date [  ];  Other:     PHYSICAL EXAMINATION: None patient notes his weight has been stable at 197 pounds   Diagnostic Studies & Laboratory data:     Recent Radiology Findings:  CLINICAL DATA:  Initial treatment strategy for gastric cancer.  EXAM: NUCLEAR MEDICINE PET SKULL BASE TO THIGH  TECHNIQUE: 10.6 mCi F-18 FDG was injected intravenously. Full-ring PET imaging was performed from the skull base to thigh after the radiotracer. CT data was obtained and used for attenuation correction and anatomic localization.  Fasting blood glucose: 73 mg/dl  COMPARISON:  04/29/2018  FINDINGS: Mediastinal blood pool activity: SUV max 2.7  NECK: Enlarged left  thyroid lobe with small calcifications and hypodensities but no accentuated metabolic activity to suggest a significant nodule.  Incidental CT findings: Left common carotid atherosclerotic calcification.  CHEST: The hypermetabolic activity in the stomach extends slightly into the distal esophagus, with esophageal portion having a maximum SUV of 7.6.  Incidental CT findings: Right  Port-A-Cath tip: SVC. Coronary, aortic arch, and branch vessel atherosclerotic vascular disease. Mild-to-moderate cardiomegaly. Prior median sternotomy. Mild bilateral gynecomastia. Atrophic right infraspinatus muscle. Pleural thickening and pleural calcification on the left side. There also some calcified pleural plaques on the right side.  ABDOMEN/PELVIS: Wall thickening and accentuated activity in the gastric fundus extending into the esophagus, maximum SUV in this vicinity 11.2.  There is unusually high activity in multiple loops of small and large bowel, without CT correlate, compatible with considerable accentuated physiologic activity. Scattered lymph nodes in the mesentery are not appreciably hypermetabolic.  8 mm right gastric node on image 104/4, questionable accentuated metabolic activity. Adjacent vascular structures noted.  Descending duodenal diverticulum.  Incidental CT findings: Prominent stool throughout the colon favors constipation. Redundant sigmoid colon extending into the right upper quadrant. Calcifications in the spleen compatible with old granulomatous disease. Aortoiliac atherosclerotic vascular disease. Small pelvic lymph nodes are not pathologically enlarged by size criteria. Right scrotal hydrocele.  SKELETON: No significant abnormal hypermetabolic activity in this region.  Incidental CT findings: Nonunited right seventh and eighth rib fractures. Grade 1 degenerative anterolisthesis at L5-S1. Lower lumbar spondylosis.  IMPRESSION: 1. Hypermetabolic mass  primarily in the gastric cardia region extending slightly into the distal esophagus, maximum SUV 11.2, compatible with malignancy. Two small adjacent right gastric lymph nodes are indistinct and not definitively hypermetabolic but could possibly be involved. No findings of metastatic disease to the liver or elsewhere. 2. Prominent physiologic activity in small and large bowel, without CT correlate. 3. No hypermetabolic rib lesion. 4. Other imaging findings of potential clinical significance: Mild thyroid goiter. Aortic Atherosclerosis (ICD10-I70.0). Mild-to-moderate cardiomegaly. Coronary atherosclerosis. Atrophic right infraspinatus muscle. Pleural calcifications, left greater than right. Prominent stool throughout the colon favors constipation. Lower lumbar spondylosis. Old granulomatous disease.   Electronically Signed   By: Van Clines M.D.   On: 05/21/2018 17:50  Ct Chest W Contrast  Result Date: 07/29/2018 CLINICAL DATA:  Gastric cancer, status post neoadjuvant chemotherapy EXAM: CT CHEST, ABDOMEN, AND PELVIS WITH CONTRAST TECHNIQUE: Multidetector CT imaging of the chest, abdomen and pelvis was performed following the standard protocol during bolus administration of intravenous contrast. CONTRAST:  118mL OMNIPAQUE IOHEXOL 300 MG/ML  SOLN COMPARISON:  PET-CT dated 05/21/2018 FINDINGS: CT CHEST FINDINGS Cardiovascular: The heart is normal in size. No pericardial effusion. No evidence thoracic aortic aneurysm. Atherosclerotic calcifications of the aortic arch. Three vessel coronary atherosclerosis. Right chest port terminates in the lower SVC. Mediastinum/Nodes: No suspicious mediastinal lymphadenopathy. Calcified right perihilar nodes. Visualized left thyroid is enlarged/nodular, with a dominant 2.9 cm nodule (series 2/image 11). Lungs/Pleura: Calcified subpleural nodularity in the posterior right upper lobe (series 6/image 47). Additional calcified granuloma in the right lower  lobe (series 6/image 87). No suspicious pulmonary nodules. Pleural-based calcifications in the left hemithorax. Associated pleural thickening. Subpleural reticulation the bilateral lung bases. No focal consolidation. No pleural effusion or pneumothorax. Musculoskeletal: Degenerative changes of the visualized thoracolumbar spine. Right rib defects. Median sternotomy. CT ABDOMEN PELVIS FINDINGS Hepatobiliary: Liver is within normal limits. Status post cholecystectomy. No intrahepatic ductal dilatation. Common duct is mildly prominent, measuring 9 mm, likely postoperative. Pancreas: Within normal limits. Spleen: Calcified splenic granulomata. Adrenals/Urinary Tract: Adrenal glands are within normal limits. Kidneys are within normal limits.  No hydronephrosis. Bladder is within normal limits. Stomach/Bowel: Very mild wall thickening at the GE junction (series 2/image 54). Prior mass at the gastric cardia has essentially resolved. No evidence of bowel obstruction. Appendix is not discretely visualized. Vascular/Lymphatic: No evidence of abdominal  aortic aneurysm. Atherosclerotic calcifications of the abdominal aorta and branch vessels. No suspicious abdominopelvic lymphadenopathy. Reproductive: Enlargement the central gland of the prostate, suggesting BPH. Other: No abdominopelvic ascites. Musculoskeletal: Degenerative changes of the lumbar spine. IMPRESSION: Very mild wall thickening at the GE junction. Prior mass at the gastric cardia has essentially resolved. No evidence of metastatic disease. Sequela of prior granulomatous disease, as above. Electronically Signed   By: Julian Hy M.D.   On: 07/29/2018 15:09     I have independently reviewed the above radiology studies  and reviewed the findings with the patient.   Recent Lab Findings: Lab Results  Component Value Date   WBC 6.1 07/17/2018   HGB 12.4 (L) 07/17/2018   HCT 37.2 (L) 07/17/2018   PLT 124 (L) 07/17/2018   GLUCOSE 234 (H) 07/17/2018    CHOL 104 11/16/2017   TRIG 98.0 11/16/2017   HDL 39.60 11/16/2017   LDLCALC 45 11/16/2017   ALT 24 07/17/2018   AST 18 07/17/2018   NA 139 07/17/2018   K 4.0 07/17/2018   CL 103 07/17/2018   CREATININE 0.94 07/17/2018   BUN 16 07/17/2018   CO2 26 07/17/2018   INR 0.90 05/15/2018   HGBA1C 5.9 (A) 05/20/2018   ENDOSCOPY: - An infiltrative and ulcerated, non-circumferential, friable mass with no bleeding and no stigmata of recent bleeding involving the lesser curvature of the stomach and the cardia measuring approximately 4 cm x 2 cm. Biopsies were taken with a cold forceps for histology. Findings: - The exam of the stomach was otherwise normal. - One superficial distal esophageal ulcer which was an extention of the ulcerated gastric mass with no bleeding and no stigmata of recent bleeding was found 41 cm from the incisors at the EGJ . - The exam of the esophagus was otherwise normal. - A small non-bleeding diverticulum was found in the second portion of the duodenum. - The exam of the duodenum was otherwise normal    EUS: ENDOSCOPIC FINDING: Findings: No gross lesions were noted in the proximal esophagus and in the mid esophagus. The Z-line was at 43.5 cm. One linear esophageal ulcerated though healing region was found 41 to 42.5 cm from the incisors. After EUS had been completed, biopsies were taken with a cold forceps for histology purposes. A large, infiltrative and ulcerated, partially circumferential (involving one-third of the lumen circumference) mass with no bleeding and stigmata of recent bleeding was found at the cardia with extension to the gastroesophageal junction and the above noted healing ulceration in the very distal esophagus. Patchy moderately erythematous mucosa was found in the gastric body, at the incisura and in the gastric antrum. Biopsies were taken with a cold forceps for histology and Helicobacter pylori testing. One non-bleeding cratered gastric  ulcer was found at the pylorus. The lesion was 13 mm in largest dimension. Biopsies were taken with a cold forceps for histology. Patchy moderately erythematous mucosa without active bleeding and with no stigmata of bleeding was found in the duodenal bulb and in the D1/D2 sweep. No gross lesions were noted in the second portion of the duodenum. A large diverticulum was found in the area of the papilla. ENDOSONOGRAPHIC FINDING: A hypoechoic non-circumferential mass was identified endosonographically in the cardia of the stomach. The mass begins at the GE junction 42.5 cm. The Z-line is at 43.5 cm. The distal extent of the tumor is at 49 cm. The endosonographic borders were well-defined. There was sonographic evidence suggesting invasion into the muscularis propria (Layer 4).  One malignant-appearing lymph node was visualized in the peripancreatic region with the ultrasound probe located 50 cm from the incisors. It measured 10.3 mm by 7.1 mm in maximal cross-sectional diameter. The node was round, hypoechoic and had well defined margins. Fine needle biopsy was performed. Color Doppler imaging was utilized prior to needle puncture to confirm a lack of significant vascular structures within the needle path. Six passes were made with the 25 gauge ultrasound core biopsy needle using a transgastric approach. Visible cores of tissue were obtained. Preliminary cytologic examination and touch preps were performed. Final cytology results are pending. Three enlarged lymph nodes were visualized in the perigastric region with the ultrasound probe located at 41 cm and 45 cm from the incisors. The lymph node at 45.5 cm measured 7.7 mm by 5.4 mm in maximal cross-sectional diameter. The lymph nodes at 41 cm measured 1.8 mm by 3.0 mm and 3.4 mm by 3.0 mm. The nodes were round, hypoechoic and had well defined margins. Sampling was not performed as it would require going through the tumor to attempt to obtain  tissue. Endosonographic imaging in the visualized portion of the liver showed no mass. The celiac region was visualized. EGD Impression: - No gross lesions in proximal/middle esophagus. - Healing esophageal ulceration noted at very distal esophagus. Biopsied. - Malignant gastric tumor at the cardia extending into the gastroesophageal junction. - Erythematous mucosa in the gastric body, incisura and antrum. Biopsied for HP. - Non-bleeding gastric ulcer. Biopsied. - Erythematous duodenopathy. . 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 EUS Impression: - A mass was found in the cardia with extension into the stomach. A tissue diagnosis was obtained prior to this exam. This is previously biopsied as adenocarcinoma. This was staged at least T2 N1 Mx by endosonographic criteria and if the lymph node is returns positive otherwise would - A mass was found in the gastroesophageal junction. The diagnosis is adenocarcinoma. - One malignant-appearing lymph node was visualized in the peripancreatic region. Cytology results are pending. However, the endosonographic appearance is suspicious for metastatic gastric adenocarcinoma. Fine needle biopsy performed. - Three enlarged lymph nodes were visualized in the perigastric region   Biopsy from  04/22/2018 Diagnosis initial biopsy of the stomach mass showed Surgical [P], GE junction-ulcerated lesion - POORLY DIFFERENTIATED ADENOCARCINOMA. - SEE COMMENT. Microscopic Comment The features slightly favor a gastric primary rather than an esophageal primary. Dr. Vicente Males has reviewed the case and concurs with this interpretation. Dr. Fuller Plan was paged on 04/26/2018. (JBK:ah 04/26/18) Enid Cutter MD     BX from 3/23: Diagnosis 1. Stomach, biopsy, Gastric - REACTIVE GASTROPATHY - NO H. PYLORI OR INTESTINAL METAPLASIA IDENTIFIED - SEE COMMENT 2. Stomach, biopsy, Gastric - REACTIVE GASTROPATHY WITH CHRONIC INFLAMMATION - NO H. PYLORI OR  INTESTINAL METAPLASIA IDENTIFIED - SEE COMMENT 3. Esophagus, biopsy, Distal - REACTIVE SQUAMOUS MUCOSA WITH INCREASED INTRAEPITHELIAL LYMPHOCYTES - SEE COMMENT  Diagnosis FINE NEEDLE ASPIRATION, ENDOSCOPIC (A) PERIPANCREATIC LYMPH NODE (SPECIMEN 1 OF 1 COLLECTED 07/22/2018) - NEOPLASTIC CELLS PRESENT - SEE COMMENT There is a monotonous cell population present on the smears and within the cells block. There is no definitive evidence of lymphoid population. By immunohistochemistry, the neoplastic cells are postive for cytokeratin AE1/3, chromogranin and synapotphysin but negative for CD56. CD45 highlights scattered cells. 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 Assessment / Plan:   Patient with gastric carcinoma involving the lesser curve of the stomach to the GE junction-currently under treatment with FOLFOX preoperatively.  Patient had repeat CT  scan chest and abdomen on March 30 and   Repeat endoscopy with EUS.  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  Follow-up scan is scheduled for April 16.   Will  coordinate with Dr. Barry Dienes planned surgical resection with  potential right thoracotomy approach should obtaining a negative margin superiorly become an issue.  It is likely that resection could be performed solely through the abdomen.    I  spent 30 minutes with telephone conference with the patient his wife and son.   Grace Isaac MD      [address] [place] [address] Office [phone]   Beeper [phone]  08/01/2018 11:23 AM

## 2018-08-01 NOTE — Telephone Encounter (Signed)
I was in the office and called the patient at his home his wife and son were on the call

## 2018-08-15 ENCOUNTER — Emergency Department (HOSPITAL_COMMUNITY)
Admission: EM | Admit: 2018-08-15 | Discharge: 2018-08-15 | Disposition: A | Discharge status: Home / Self Care | Payer: Medicare Other | Attending: Emergency Medicine | Admitting: Emergency Medicine

## 2018-08-15 ENCOUNTER — Encounter (HOSPITAL_COMMUNITY): Payer: Self-pay

## 2018-08-15 ENCOUNTER — Other Ambulatory Visit: Payer: Self-pay

## 2018-08-15 ENCOUNTER — Ambulatory Visit (HOSPITAL_COMMUNITY)
Admission: RE | Admit: 2018-08-15 | Discharge: 2018-08-15 | Disposition: A | Discharge status: Home / Self Care | Payer: Medicare Other | Source: Ambulatory Visit | Attending: Hematology | Admitting: Hematology

## 2018-08-15 ENCOUNTER — Telehealth: Payer: Self-pay | Admitting: *Deleted

## 2018-08-15 DIAGNOSIS — Z85028 Personal history of other malignant neoplasm of stomach: Secondary | ICD-10-CM | POA: Insufficient documentation

## 2018-08-15 DIAGNOSIS — Z7982 Long term (current) use of aspirin: Secondary | ICD-10-CM | POA: Insufficient documentation

## 2018-08-15 DIAGNOSIS — Z79899 Other long term (current) drug therapy: Secondary | ICD-10-CM | POA: Insufficient documentation

## 2018-08-15 DIAGNOSIS — E11649 Type 2 diabetes mellitus with hypoglycemia without coma: Secondary | ICD-10-CM | POA: Diagnosis not present

## 2018-08-15 DIAGNOSIS — E162 Hypoglycemia, unspecified: Secondary | ICD-10-CM

## 2018-08-15 DIAGNOSIS — Z8501 Personal history of malignant neoplasm of esophagus: Secondary | ICD-10-CM | POA: Diagnosis not present

## 2018-08-15 DIAGNOSIS — Z951 Presence of aortocoronary bypass graft: Secondary | ICD-10-CM | POA: Diagnosis not present

## 2018-08-15 DIAGNOSIS — D3A8 Other benign neuroendocrine tumors: Secondary | ICD-10-CM | POA: Insufficient documentation

## 2018-08-15 DIAGNOSIS — Z85828 Personal history of other malignant neoplasm of skin: Secondary | ICD-10-CM | POA: Insufficient documentation

## 2018-08-15 DIAGNOSIS — Z794 Long term (current) use of insulin: Secondary | ICD-10-CM | POA: Diagnosis not present

## 2018-08-15 DIAGNOSIS — I2510 Atherosclerotic heart disease of native coronary artery without angina pectoris: Secondary | ICD-10-CM | POA: Insufficient documentation

## 2018-08-15 DIAGNOSIS — R4182 Altered mental status, unspecified: Secondary | ICD-10-CM | POA: Diagnosis present

## 2018-08-15 LAB — GLUCOSE, CAPILLARY: Glucose-Capillary: 17 mg/dL — CL (ref 70–99)

## 2018-08-15 LAB — BASIC METABOLIC PANEL
Anion gap: 10 (ref 5–15)
BUN: 20 mg/dL (ref 8–23)
CO2: 26 mmol/L (ref 22–32)
Calcium: 8.5 mg/dL — ABNORMAL LOW (ref 8.9–10.3)
Chloride: 104 mmol/L (ref 98–111)
Creatinine, Ser: 0.87 mg/dL (ref 0.61–1.24)
GFR calc Af Amer: >60 mL/min (ref >60)
GFR calc non Af Amer: >60 mL/min (ref >60)
Glucose, Bld: 238 mg/dL — ABNORMAL HIGH (ref 70–99)
Potassium: 3 mmol/L — ABNORMAL LOW (ref 3.5–5.1)
Sodium: 140 mmol/L (ref 135–145)

## 2018-08-15 LAB — CBG MONITORING, ED
Glucose-Capillary: 136 mg/dL — ABNORMAL HIGH (ref 70–99)
Glucose-Capillary: 148 mg/dL — ABNORMAL HIGH (ref 70–99)

## 2018-08-15 LAB — CBC
HCT: 41.1 % (ref 39.0–52.0)
Hemoglobin: 13.5 g/dL (ref 13.0–17.0)
MCH: 31.4 pg (ref 26.0–34.0)
MCHC: 32.8 g/dL (ref 30.0–36.0)
MCV: 95.6 fL (ref 80.0–100.0)
Platelets: 213 10*3/uL (ref 150–400)
RBC: 4.3 MIL/uL (ref 4.22–5.81)
RDW: 15.4 % (ref 11.5–15.5)
WBC: 8.8 10*3/uL (ref 4.0–10.5)
nRBC: 0 % (ref 0.0–0.2)

## 2018-08-15 MED ORDER — GALLIUM GA 68 DOTATATE IV KIT
5.0000 | PACK | Freq: Once | INTRAVENOUS | Status: AC | PRN
  Administered 2018-08-15: 5 via INTRAVENOUS

## 2018-08-15 MED ORDER — DEXTROSE 50 % IV SOLN
1.0000 | Freq: Once | INTRAVENOUS | Status: AC
  Administered 2018-08-15: 1 via INTRAVENOUS

## 2018-08-15 MED ORDER — DEXTROSE 50 % IV SOLN
INTRAVENOUS | Status: AC
  Administered 2018-08-15: 1 via INTRAVENOUS
  Filled 2018-08-15: qty 50

## 2018-08-15 NOTE — ED Notes (Signed)
Bed: QS12 Expected date:  Expected time:  Means of arrival:  Comments: Possible rapid response

## 2018-08-15 NOTE — ED Notes (Addendum)
Patient discharged from ED in stretcher, transported by IR staff for PET scan. Patient VSS. Patient FSBG stable. Patient states he will tell IR staff if he starts to feel his blood sugar dropping again, and IR staff state they will provide patient with [place] upon completion of his scan. This RN updated patient wife, Vaughan Basta, via the phone. Patient transferred with PIV to left wrist and implanted port accessed, for PET scan use. IR nurse to d/c PIV and deaccess port upon patient discharge.

## 2018-08-15 NOTE — ED Notes (Signed)
Patient awake and alert. Patient drinking orange juice and eating granola bar.

## 2018-08-15 NOTE — ED Notes (Signed)
Patient brought over as Rapid Response from PET scan. Patient has history of cancer and was getting a PET scan today when he became altered. Patient eyes open, but patient not responding upon arrival to ED. Patient skin cold and diaphoretic. MD Pickering at bedside. IV placed. D50 ampule administered per MD Pickering order. Patient port accessed- labs drawn.

## 2018-08-15 NOTE — Telephone Encounter (Signed)
Labs received, pt BS 17, call to Imaging, pt treated at ED and will return to Imaging for scan.

## 2018-08-15 NOTE — ED Provider Notes (Signed)
[facility] DEPT Provider Note   CSN: [id number] Arrival date & time: 08/15/18  0818    History   Chief Complaint Chief Complaint  Patient presents with  . Altered Mental Status  . Hypoglycemia    HPI Tristan Hughes is a 78 y.o. male.     HPI Patient presents with altered mental status.  Was at nuclear medicine for a PET scan.  Has had the nuclear medicine and became more confused.  Decreased mental status.  Rapid response did a CBG and found to be 17.  Decreased responsiveness upon arrival to ER.  Diaphoretic.  Will minimally respond to pain but nonverbal.  D50 given with improvement of symptoms.  Patient had his normal Lantus and and only a slight decrease in Humalog this morning.  States he talked about whether he needed decrease his insulin he was told no.  Did not eat breakfast because of the procedure. Past Medical History:  Diagnosis Date  . CAD 07/09/2009   Cath 12-02-10 90% left main, 90%LAD, 99% circumflex, 80% RCA  . Cancer ([place])    Stomach and esophagus. 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 Cataract    basal cell CA- left ear  . COLONIC POLYPS, HX OF 07/09/2009   Qualifier: Diagnosis of  By: Valma Cava LPN, [name]    . DIAB W/O COMP TYPE II/UNS NOT STATED UNCNTRL 07/09/2009   Qualifier: Diagnosis of  By: Joyce Gross    . GERD (gastroesophageal reflux disease)   . History of chicken pox   . HYPERLIPIDEMIA 07/09/2009   Qualifier: Diagnosis of  By: Valma Cava LPN, [name]    . HYPERTENSION 07/09/2009   Qualifier: Diagnosis of  By: Valma Cava LPN, [name]    . OBSTRUCTIVE SLEEP APNEA 07/09/2009   Qualifier: Diagnosis of  By: Joyce Gross    . Sleep apnea    no CPAP    Patient Active Problem List   Diagnosis Date Noted  . Anemia due to antineoplastic chemotherapy 07/17/2018  . Port-A-Cath in place 06/12/2018  . Leukopenia due to antineoplastic chemotherapy ([place]) 06/12/2018  . Chemotherapy-induced thrombocytopenia 06/12/2018  . Protein  malnutrition ([place]) 06/12/2018  . Drug rash 06/12/2018  . Chemotherapy-induced nausea 05/22/2018  . 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 Abdominal pain 05/08/2018  . Obesity (BMI 30-39.9) 10/16/2013  . Type 2 diabetes mellitus, controlled ([place]) 07/09/2009  . Hyperlipidemia 07/09/2009  . OBSTRUCTIVE SLEEP APNEA 07/09/2009  . Essential hypertension 07/09/2009  . Coronary atherosclerosis 07/09/2009  . COLONIC POLYPS, HX OF 07/09/2009    Past Surgical History:  Procedure Laterality Date  . BIOPSY  07/22/2018   Procedure: BIOPSY;  Surgeon: Mansouraty, Telford Nab., MD;  Location: [facility];  Service: Endoscopy;;  . BIOPSY BREAST Left   . CHOLECYSTECTOMY    . COLONOSCOPY    . CORONARY ANGIOPLASTY WITH STENT PLACEMENT    . CORONARY ARTERY BYPASS GRAFT  8/12   CABG X 4 LIMA-LAD, SVG-PDB, SVG-OM/RAMUS   /  . ESOPHAGOGASTRODUODENOSCOPY N/A 07/22/2018   Procedure: ESOPHAGOGASTRODUODENOSCOPY (EGD);  Surgeon: Rush Landmark Telford Nab., MD;  Location: [facility];  Service: Endoscopy;  Laterality: N/A;  . EUS N/A 07/22/2018   Procedure: UPPER ENDOSCOPIC ULTRASOUND (EUS) RADIAL;  Surgeon: Rush Landmark Telford Nab., MD;  Location: [facility];  Service: Endoscopy;  Laterality: N/A;  . EUS  07/22/2018   Procedure: UPPER ENDOSCOPIC ULTRASOUND (EUS) LINEAR;  Surgeon: Rush Landmark Telford Nab., MD;  Location: [facility];  Service: Endoscopy;;  . EYE SURGERY Bilateral    catarct  .  FINE NEEDLE ASPIRATION  07/22/2018   Procedure: FINE NEEDLE ASPIRATION (FNA) LINEAR;  Surgeon: Irving Copas., MD;  Location: [facility];  Service: Endoscopy;;  . IR IMAGING GUIDED PORT INSERTION  05/15/2018  . TONSILLECTOMY          Home Medications    Prior to Admission medications   Medication Sig Start Date End Date Taking? Authorizing Provider  amLODipine (NORVASC) 5 MG tablet TAKE 1 TABLET DAILY (PLEASE SCHEDULE A PHYSICAL FOR MORE REFILLS) Patient taking differently: Take 5 mg by mouth every  evening.  05/27/18   Burchette, Alinda Sierras, MD  Ascorbic Acid (VITAMIN C) 1000 MG tablet Take 1,000 mg by mouth every evening.     [provider]  aspirin EC 81 MG tablet Take 81 mg by mouth every evening.    [provider]  b complex vitamins tablet Take 1 tablet by mouth every evening.     [provider]  calcium carbonate (TUMS - DOSED IN MG ELEMENTAL CALCIUM) 500 MG chewable tablet Chew 1-2 tablets by mouth 2 (two) times daily as needed (acid reflux/indigestion.).     [provider]  Cholecalciferol (VITAMIN D-3) 125 MCG (5000 UT) TABS Take 5,000 Units by mouth daily.    [provider]  dexamethasone (DECADRON) 4 MG tablet Take 2 tablets (8 mg total) by mouth daily. Start the day after chemotherapy for 2 days. Take with food. Patient taking differently: Take 8 mg by mouth See admin instructions. Start the day after chemotherapy for 2 days. Take with food. 05/22/18   [name], MD  ezetimibe (ZETIA) 10 MG tablet TAKE 1 TABLET DAILY Patient taking differently: Take 10 mg by mouth daily.  01/01/18   Burchette, Alinda Sierras, MD  furosemide (LASIX) 20 MG tablet TAKE 1 TABLET TWICE A DAY Patient taking differently: Take 20 mg by mouth 2 (two) times daily.  01/01/18   Burchette, Alinda Sierras, MD  [place] 300 MG CAPS Take 300 mg by mouth 2 (two) times daily.    [provider]  insulin lispro (HUMALOG) 100 UNIT/ML injection Inject 0.15-0.18 mLs (15-18 Units total) into the skin 3 (three) times daily before meals. 07/23/18   Burchette, Alinda Sierras, MD  Insulin Syringe-Needle U-100 (B-D INS SYR HALF-UNIT .3CC/31G) 31G X 5/16" 0.3 ML MISC by Does not apply route.    [provider]  Insulin Syringe-Needle U-100 31G X 5/16" 1 ML MISC Use once daily 12/19/16   Burchette, Alinda Sierras, MD  LANTUS 100 UNIT/ML injection INJECT 46 UNITS UNDER THE SKIN AT BEDTIME Patient taking differently: Inject 46 Units into the skin at bedtime.  10/22/17   Burchette, Alinda Sierras, MD   lidocaine-prilocaine (EMLA) cream Apply to affected area once Patient taking differently: Apply 1 application topically daily as needed (prior to port being accessed.).  05/22/18   [name], MD  lisinopril (PRINIVIL,ZESTRIL) 20 MG tablet Take 10 mg by mouth daily.    [provider]  LORazepam (ATIVAN) 0.5 MG tablet Take 1 tablet (0.5 mg total) by mouth every 6 (six) hours as needed (Nausea or vomiting). 05/22/18   [name], MD  metFORMIN (GLUCOPHAGE) 1000 MG tablet TAKE 1 TABLET TWICE A DAY WITH MEALS Patient taking differently: Take 1,000 mg by mouth 2 (two) times daily.  01/22/18   Burchette, Alinda Sierras, MD  metoprolol tartrate (LOPRESSOR) 50 MG tablet TAKE ONE AND ONE-HALF TABLETS TWICE A DAY (SCHEDULE A PHYSICAL FOR MORE REFILLS) Patient taking differently: Take 75 mg by  mouth 2 (two) times daily.  01/22/18   Burchette, Alinda Sierras, MD  Multiple Vitamin (MULTIVITAMIN WITH MINERALS) TABS tablet Take 1 tablet by mouth every evening.    [provider]  mupirocin ointment (BACTROBAN) 2 % Place 1 application into the nose 2 (two) times daily. Apply to left second toe once daily Patient taking differently: Apply 1 application topically once. Apply to left second toe once daily 06/19/18   Marzetta Board, DPM  Omega-3 Fatty Acids (FISH OIL) 1200 MG CAPS Take 1,200 mg by mouth 2 (two) times daily.     [provider]  omeprazole (PRILOSEC) 40 MG capsule Take 1 capsule (40 mg total) by mouth 2 (two) times daily. 07/22/18 07/22/19  Mansouraty, Telford Nab., MD  prochlorperazine (COMPAZINE) 10 MG tablet Take 1 tablet (10 mg total) by mouth every 6 (six) hours as needed (Nausea or vomiting). 05/22/18   [name], MD  rosuvastatin (CRESTOR) 20 MG tablet TAKE 1 TABLET DAILY (PLEASE SCHEDULE A PHYSICAL FOR MORE REFILLS) Patient taking differently: Take 20 mg by mouth every evening.  01/01/18   Burchette, Alinda Sierras, MD  traMADol (ULTRAM) 50 MG tablet Take 50 mg by mouth 3 (three) times daily as  needed (pain).  06/08/18   [provider]  vitamin E (VITAMIN E) 400 UNIT capsule Takes one tablet by mouth daily. Patient taking differently: Take 400 Units by mouth daily. Takes one tablet by mouth daily. 10/25/15   Burchette, Alinda Sierras, MD    Family History Family History  Problem Relation Age of Onset  . Hypertension Mother   . Heart disease Mother   . Stroke Mother   . Diabetes Mother   . Cancer Mother        pancreatic  . Pancreatic cancer Mother   . Heart disease Father   . Hypertension Father   . Colon cancer Maternal Uncle   . Esophageal cancer Neg Hx   . Stomach cancer Neg Hx   . Rectal cancer Neg Hx     Social History Social History   Tobacco Use  . Smoking status: Never Smoker  . Smokeless tobacco: Never Used  Substance Use Topics  . Alcohol use: No  . Drug use: No     Allergies   Naproxen sodium   Review of Systems Review of Systems  Constitutional: Positive for diaphoresis. Negative for appetite change.  HENT: Negative for congestion.   Respiratory: Negative for shortness of breath.   Cardiovascular: Negative for chest pain.  Gastrointestinal: Negative for abdominal pain.  Musculoskeletal: Negative for back pain.  Neurological: Negative for numbness.  Psychiatric/Behavioral: Positive for confusion.     Physical Exam Updated Vital Signs BP 127/63   Pulse 63   Temp 97.7 F (36.5 C) (Oral)   Resp 20   SpO2 99%   Physical Exam Vitals signs and nursing note reviewed.  Constitutional:      Appearance: He is diaphoretic.  Eyes:     Pupils: Pupils are equal, round, and reactive to light.  Cardiovascular:     Rate and Rhythm: Normal rate.  Pulmonary:     Breath sounds: No wheezing or rhonchi.  Abdominal:     Tenderness: There is no abdominal tenderness.  Musculoskeletal:     Right lower leg: No edema.  Neurological:     Mental Status: He is alert.     Comments: Initially minimally responsive.  Will look to voice with stimulation  but nonverbal.  After D50 patient much more awake  and able to converse normally.  Moving all extremities.      ED Treatments / Results  Labs (all labs ordered are listed, but only abnormal results are displayed) Labs Reviewed  BASIC METABOLIC PANEL - Abnormal; Notable for the following components:      Result Value   Potassium 3.0 (*)    Glucose, Bld 238 (*)    Calcium 8.5 (*)    All other components within normal limits  CBG MONITORING, ED - Abnormal; Notable for the following components:   Glucose-Capillary 148 (*)    All other components within normal limits  CBC    EKG None  Radiology No results found.  Procedures Procedures (including critical care time)  Medications Ordered in ED Medications  dextrose 50 % solution 50 mL (1 ampule Intravenous Given 08/15/18 0824)     Initial Impression / Assessment and Plan / ED Course  I have reviewed the triage vital signs and the nursing notes.  Pertinent labs & imaging results that were available during my care of the patient were reviewed by me and considered in my medical decision making (see chart for details).        Patient with hyperglycemia.  Likely related to not eating is still taking his insulin.  Much improved after D50 and has been drinking orange juice.  Awake and appropriate.  Patient still can get his PET scan done if he is transferred back to nuclear medicine now.  I think this is safe for him to do.  He can check the sugar there if he begins to feel bad.  Discharged.  Final Clinical Impressions(s) / ED Diagnoses   Final diagnoses:  Hypoglycemia    ED Discharge Orders    None       Davonna Belling, MD 08/15/18 [phone]

## 2018-08-21 ENCOUNTER — Ambulatory Visit (INDEPENDENT_AMBULATORY_CARE_PROVIDER_SITE_OTHER): Payer: Medicare Other | Admitting: Podiatry

## 2018-08-21 ENCOUNTER — Other Ambulatory Visit: Payer: Self-pay

## 2018-08-21 DIAGNOSIS — B351 Tinea unguium: Secondary | ICD-10-CM

## 2018-08-21 DIAGNOSIS — E1142 Type 2 diabetes mellitus with diabetic polyneuropathy: Secondary | ICD-10-CM

## 2018-08-21 DIAGNOSIS — M79675 Pain in left toe(s): Secondary | ICD-10-CM

## 2018-08-21 DIAGNOSIS — M79674 Pain in right toe(s): Secondary | ICD-10-CM | POA: Diagnosis not present

## 2018-08-21 NOTE — Patient Instructions (Signed)

## 2018-08-23 ENCOUNTER — Encounter: Payer: Self-pay | Admitting: Family Medicine

## 2018-08-23 ENCOUNTER — Other Ambulatory Visit: Payer: Self-pay

## 2018-08-23 MED ORDER — OMEPRAZOLE 40 MG PO CPDR: 40.0000 mg | DELAYED_RELEASE_CAPSULE | Freq: Two times a day (BID) | ORAL | 1 refills | Status: DC

## 2018-08-24 ENCOUNTER — Other Ambulatory Visit: Payer: Self-pay | Admitting: Family Medicine

## 2018-08-25 ENCOUNTER — Encounter: Payer: Self-pay | Admitting: Family Medicine

## 2018-08-27 ENCOUNTER — Other Ambulatory Visit: Payer: Self-pay | Admitting: *Deleted

## 2018-08-27 ENCOUNTER — Telehealth: Payer: Self-pay | Admitting: Genetic Counselor

## 2018-08-27 ENCOUNTER — Encounter: Payer: Self-pay | Admitting: Podiatry

## 2018-08-27 DIAGNOSIS — C160 Malignant neoplasm of cardia: Secondary | ICD-10-CM

## 2018-08-27 NOTE — Progress Notes (Signed)
Subjective: Tristan Hughes presents today for preventative diabetic foot care with painful, thick toenails 1-5 b/l that he cannot cut and which interfere with daily activities.  Pain is aggravated when wearing enclosed shoe gear and relieved with periodic professional debridement.  Eulas Post, MD is his PCP and last visit was 05/20/2018.  He voices no new pedal concerns on today's visit.   Allergies  Allergen Reactions  . Naproxen Sodium Swelling    Objective:  Vascular Examination: Capillary refill time immediate x 10 digits.  Dorsalis pedis 1/4 b/l.   Posterior tibial pulses palpable b/l.  Digital hair absent  x 10 digits.  Skin temperature gradient WNL b/l.  Dermatological Examination: Skin with normal turgor, texture and tone b/l.  Toenails 1-5 b/l discolored, thick, dystrophic with subungual debris and pain with palpation to nailbeds due to thickness of nails.  Area of prior laceration left 2nd digit is healed.  Musculoskeletal: Muscle strength 5/5 to all LE muscle groups  HAV with bunion deformity b/l.  Hammertoes 2-5 b/l.  No pain, crepitus or joint limitation noted with ROM.   Neurological: Sensation intact with 10 gram monofilament.  Vibratory sensation absent b/l.  Assessment: Painful onychomycosis toenails 1-5 b/l  NIDDM with neuropathy  Plan: 1. Toenails 1-5 b/l were debrided in length and girth without iatrogenic bleeding. 2. Patient to continue soft, supportive shoe gear daily. 3. Patient to report any pedal injuries to medical professional immediately. 4. Follow up 9 weeks.  5. Patient/POA to call should there be a concern in the interim.

## 2018-08-27 NOTE — Telephone Encounter (Signed)
Added genetics appointment 5/6 after f/u with YZ. Date/time ok per KP due to patient surgery June per wife. Per 3/24 schedule message referral from Dr. Barry Dienes at [facility]. Spoke with wife re appointment.

## 2018-08-29 ENCOUNTER — Encounter: Payer: Self-pay | Admitting: Family Medicine

## 2018-09-04 ENCOUNTER — Inpatient Hospital Stay: Payer: Medicare Other

## 2018-09-04 ENCOUNTER — Telehealth: Payer: Self-pay | Admitting: Hematology

## 2018-09-04 ENCOUNTER — Other Ambulatory Visit: Payer: Medicare Other

## 2018-09-04 ENCOUNTER — Other Ambulatory Visit: Payer: Self-pay

## 2018-09-04 ENCOUNTER — Other Ambulatory Visit: Payer: Self-pay | Admitting: Genetic Counselor

## 2018-09-04 ENCOUNTER — Inpatient Hospital Stay (HOSPITAL_BASED_OUTPATIENT_CLINIC_OR_DEPARTMENT_OTHER): Payer: Medicare Other | Admitting: Genetic Counselor

## 2018-09-04 ENCOUNTER — Inpatient Hospital Stay: Payer: Medicare Other | Attending: Hematology | Admitting: Hematology

## 2018-09-04 ENCOUNTER — Encounter: Payer: Self-pay | Admitting: Genetic Counselor

## 2018-09-04 ENCOUNTER — Encounter: Payer: Self-pay | Admitting: Hematology

## 2018-09-04 VITALS — BP 117/66 | HR 58 | Temp 97.8°F | Resp 18 | Ht 69.0 in | Wt 212.4 lb

## 2018-09-04 DIAGNOSIS — Z9221 Personal history of antineoplastic chemotherapy: Secondary | ICD-10-CM | POA: Insufficient documentation

## 2018-09-04 DIAGNOSIS — Z7982 Long term (current) use of aspirin: Secondary | ICD-10-CM

## 2018-09-04 DIAGNOSIS — C169 Malignant neoplasm of stomach, unspecified: Secondary | ICD-10-CM

## 2018-09-04 DIAGNOSIS — Z452 Encounter for adjustment and management of vascular access device: Secondary | ICD-10-CM | POA: Insufficient documentation

## 2018-09-04 DIAGNOSIS — Z794 Long term (current) use of insulin: Secondary | ICD-10-CM | POA: Diagnosis not present

## 2018-09-04 DIAGNOSIS — C165 Malignant neoplasm of lesser curvature of stomach, unspecified: Secondary | ICD-10-CM | POA: Insufficient documentation

## 2018-09-04 DIAGNOSIS — I700 Atherosclerosis of aorta: Secondary | ICD-10-CM | POA: Insufficient documentation

## 2018-09-04 DIAGNOSIS — Z86010 Personal history of colonic polyps: Secondary | ICD-10-CM | POA: Diagnosis not present

## 2018-09-04 DIAGNOSIS — Z800 Family history of malignant neoplasm of digestive organs: Secondary | ICD-10-CM | POA: Diagnosis not present

## 2018-09-04 DIAGNOSIS — Z79899 Other long term (current) drug therapy: Secondary | ICD-10-CM | POA: Diagnosis not present

## 2018-09-04 DIAGNOSIS — Z95828 Presence of other vascular implants and grafts: Secondary | ICD-10-CM

## 2018-09-04 DIAGNOSIS — D3A8 Other benign neuroendocrine tumors: Secondary | ICD-10-CM | POA: Insufficient documentation

## 2018-09-04 DIAGNOSIS — Z1379 Encounter for other screening for genetic and chromosomal anomalies: Secondary | ICD-10-CM

## 2018-09-04 DIAGNOSIS — I2510 Atherosclerotic heart disease of native coronary artery without angina pectoris: Secondary | ICD-10-CM | POA: Insufficient documentation

## 2018-09-04 LAB — CBC WITH DIFFERENTIAL (CANCER CENTER ONLY)
Abs Immature Granulocytes: 0.01 10*3/uL (ref 0.00–0.07)
Basophils Absolute: 0 10*3/uL (ref 0.0–0.1)
Basophils Relative: 1 %
Eosinophils Absolute: 0.1 10*3/uL (ref 0.0–0.5)
Eosinophils Relative: 2 %
HCT: 42.3 % (ref 39.0–52.0)
Hemoglobin: 13.5 g/dL (ref 13.0–17.0)
Immature Granulocytes: 0 %
Lymphocytes Relative: 14 %
Lymphs Abs: 1 10*3/uL (ref 0.7–4.0)
MCH: 30 pg (ref 26.0–34.0)
MCHC: 31.9 g/dL (ref 30.0–36.0)
MCV: 94 fL (ref 80.0–100.0)
Monocytes Absolute: 0.7 10*3/uL (ref 0.1–1.0)
Monocytes Relative: 11 %
Neutro Abs: 5 10*3/uL (ref 1.7–7.7)
Neutrophils Relative %: 72 %
Platelet Count: 172 10*3/uL (ref 150–400)
RBC: 4.5 MIL/uL (ref 4.22–5.81)
RDW: 13.6 % (ref 11.5–15.5)
WBC Count: 6.9 10*3/uL (ref 4.0–10.5)
nRBC: 0 % (ref 0.0–0.2)

## 2018-09-04 LAB — CMP (CANCER CENTER ONLY)
ALT: 18 U/L (ref 0–44)
AST: 21 U/L (ref 15–41)
Albumin: 3.6 g/dL (ref 3.5–5.0)
Alkaline Phosphatase: 57 U/L (ref 38–126)
Anion gap: 9 (ref 5–15)
BUN: 20 mg/dL (ref 8–23)
CO2: 28 mmol/L (ref 22–32)
Calcium: 8.7 mg/dL — ABNORMAL LOW (ref 8.9–10.3)
Chloride: 106 mmol/L (ref 98–111)
Creatinine: 0.87 mg/dL (ref 0.61–1.24)
GFR, Est AFR Am: >60 mL/min (ref >60)
GFR, Estimated: >60 mL/min (ref >60)
Glucose, Bld: 49 mg/dL — ABNORMAL LOW (ref 70–99)
Potassium: 3.6 mmol/L (ref 3.5–5.1)
Sodium: 143 mmol/L (ref 135–145)
Total Bilirubin: 0.7 mg/dL (ref 0.3–1.2)
Total Protein: 6.7 g/dL (ref 6.5–8.1)

## 2018-09-04 MED ORDER — SODIUM CHLORIDE 0.9% FLUSH
10.0000 mL | Freq: Once | INTRAVENOUS | Status: AC
  Administered 2018-09-04: 10 mL
  Filled 2018-09-04: qty 10

## 2018-09-04 MED ORDER — HEPARIN SOD (PORK) LOCK FLUSH 100 UNIT/ML IV SOLN
500.0000 [IU] | Freq: Once | INTRAVENOUS | Status: AC
  Administered 2018-09-04: 500 [IU]
  Filled 2018-09-04: qty 5

## 2018-09-04 NOTE — Progress Notes (Signed)
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 Family history of colon cancer   3. Family history of pancreatic cancer   4. COLONIC POLYPS, HX OF      HISTORY OF PRESENT ILLNESS:   Tristan Hughes, a 78 y.o. male, was seen for a [place] cancer genetics consultation at the request of Dr. Maylon Peppers due to a personal and family history of cancer and a personal history of .  Tristan Hughes presents to clinic today to discuss the possibility of a hereditary predisposition to cancer, genetic testing, and to further clarify his future cancer risks, as well as potential cancer risks for family members.   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 The treatment plan is for surgery the beginning of June. He has a history of colon polyps, one of which was hyperplastic, the other two pathology is unknown.     CANCER HISTORY:    Gastric adenocarcinoma ([place])   04/22/2018 Procedure    EGD: - An infiltrative and ulcerated, non-circumferential, friable mass with no bleeding and no stigmata of recent bleeding involving the lesser curvature of the stomach and the cardia measuring approximately 4 cm x 2 cm. Biopsies were taken with a cold forceps for histology. Findings: - The exam of the stomach was otherwise normal. - One superficial distal esophageal ulcer which was an extention of the ulcerated gastric mass with no bleeding and no stigmata of recent bleeding was found 41 cm from the incisors at the EGJ . - The exam of the esophagus was otherwise normal. - A small non-bleeding diverticulum was found in the second portion of the duodenum. - The exam of the duodenum was otherwise normal.    04/22/2018 Pathology Results    Accession: [id number]  Diagnosis Surgical [P], GE junction-ulcerated lesion - POORLY  DIFFERENTIATED ADENOCARCINOMA. - SEE COMMENT. Microscopic Comment The features slightly favor a gastric primary rather than an esophageal primary    04/29/2018 Imaging    CT CAP: IMPRESSION: 1. Irregular wall thickening of the proximal stomach extending from the gastroesophageal junction into the gastric cardia suspicious for infiltrative neoplasm given provided clinical history. Adjacent prominent lymph nodes, possibly small nodal metastases. No distant metastases identified. 2. No suspicious hepatic findings. 3. Posttraumatic right rib findings with probable incidental lesion involving the posterior aspect of the right 4th rib. No suspicious pulmonary nodules. 4. Evidence of prior granulomatous disease with calcified hilar and upper abdominal lymph nodes and calcified splenic granulomas. 5.  Aortic Atherosclerosis (ICD10-I70.0).    05/21/2018 Imaging    PET: IMPRESSION: 1. Hypermetabolic mass primarily in the gastric cardia region extending slightly into the distal esophagus, maximum SUV 11.2, compatible with malignancy. Two small adjacent right gastric lymph nodes are indistinct and not definitively hypermetabolic but could possibly be involved. No findings of metastatic disease to the liver or elsewhere. 2. Prominent physiologic activity in small and large bowel, without CT correlate. 3. No hypermetabolic rib lesion. 4. Other imaging findings of potential clinical significance: Mild thyroid goiter. Aortic Atherosclerosis (ICD10-I70.0). Mild-to-moderate cardiomegaly. Coronary atherosclerosis. Atrophic right infraspinatus muscle. Pleural calcifications, left greater than right. Prominent stool throughout the colon favors constipation. Lower lumbar spondylosis. Old granulomatous disease    05/29/2018 -  Chemotherapy    The patient had palonosetron (ALOXI) injection 0.25 mg, 0.25 mg, Intravenous,  Once, 3 of 6 cycles Administration: 0.25 mg (05/29/2018), 0.25 mg (06/12/2018),  0.25 mg (07/04/2018) leucovorin 868 mg in dextrose 5 % 250 mL infusion, 400 mg/m2 = 868 mg, Intravenous,  Once, 3 of 6 cycles Administration: 868 mg (05/29/2018), 868 mg (06/12/2018), 868 mg (07/04/2018) oxaliplatin (ELOXATIN) 185 mg in dextrose 5 % 500 mL chemo infusion, 85 mg/m2 = 185 mg, Intravenous,  Once, 3 of 6 cycles Administration: 185 mg (05/29/2018), 185 mg (06/12/2018), 185 mg (07/04/2018) fluorouracil (ADRUCIL) chemo injection 850 mg, 400 mg/m2 = 850 mg, Intravenous,  Once, 2 of 2 cycles Administration: 850 mg (05/29/2018), 850 mg (06/12/2018) fluorouracil (ADRUCIL) 5,200 mg in sodium chloride 0.9 % 146 mL chemo infusion, 2,400 mg/m2 = 5,200 mg, Intravenous, 1 Day/Dose, 3 of 6 cycles Administration: 5,200 mg (05/29/2018), 5,200 mg (06/12/2018), 5,200 mg (07/04/2018)  for chemotherapy treatment.       Past Medical History:  Diagnosis Date  . CAD 07/09/2009   Cath 12-02-10 90% left main, 90%LAD, 99% circumflex, 80% RCA  . Cancer ([place])    Stomach and esophagus. 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 Cataract    basal cell CA- left ear  . COLONIC POLYPS, HX OF 07/09/2009   Qualifier: Diagnosis of  By: Valma Cava LPN, [name]    . DIAB W/O COMP TYPE II/UNS NOT STATED UNCNTRL 07/09/2009   Qualifier: Diagnosis of  By: Joyce Gross    . Family history of colon cancer   . Family history of pancreatic cancer   . GERD (gastroesophageal reflux disease)   . History of chicken pox   . HYPERLIPIDEMIA 07/09/2009   Qualifier: Diagnosis of  By: Valma Cava LPN, [name]    . HYPERTENSION 07/09/2009   Qualifier: Diagnosis of  By: Valma Cava LPN, [name]    . OBSTRUCTIVE SLEEP APNEA 07/09/2009   Qualifier: Diagnosis of  By: Joyce Gross    . Sleep apnea    no CPAP    Past Surgical History:  Procedure Laterality Date  . BIOPSY  07/22/2018   Procedure: BIOPSY;  Surgeon: Mansouraty, Telford Nab., MD;  Location: [facility];  Service: Endoscopy;;  . BIOPSY BREAST Left   . CHOLECYSTECTOMY    . COLONOSCOPY    . CORONARY  ANGIOPLASTY WITH STENT PLACEMENT    . CORONARY ARTERY BYPASS GRAFT  8/12   CABG X 4 LIMA-LAD, SVG-PDB, SVG-OM/RAMUS   /  . ESOPHAGOGASTRODUODENOSCOPY N/A 07/22/2018   Procedure: ESOPHAGOGASTRODUODENOSCOPY (EGD);  Surgeon: Rush Landmark Telford Nab., MD;  Location: [facility];  Service: Endoscopy;  Laterality: N/A;  . EUS N/A 07/22/2018   Procedure: UPPER ENDOSCOPIC ULTRASOUND (EUS) RADIAL;  Surgeon: Rush Landmark Telford Nab., MD;  Location: [facility];  Service: Endoscopy;  Laterality: N/A;  . EUS  07/22/2018   Procedure: UPPER ENDOSCOPIC ULTRASOUND (EUS) LINEAR;  Surgeon: Rush Landmark Telford Nab., MD;  Location: [facility];  Service: Endoscopy;;  . EYE SURGERY Bilateral    catarct  . FINE NEEDLE ASPIRATION  07/22/2018   Procedure: FINE NEEDLE ASPIRATION (FNA) LINEAR;  Surgeon: Irving Copas., MD;  Location: [facility];  Service: Endoscopy;;  . IR IMAGING GUIDED PORT INSERTION  05/15/2018  . TONSILLECTOMY      Social History   Socioeconomic History  . Marital status: Married    Spouse name: Not on file  . Number of children: Not on file  . Years of education: Not on file  . Highest education level: Not on file  Occupational History  . Not on file  Social Needs  . Emergency planning/management officer  strain: Not on file  . Food insecurity:    Worry: Not on file    Inability: Not on file  . Transportation needs:    Medical: Not on file    Non-medical: Not on file  Tobacco Use  . Smoking status: Never Smoker  . Smokeless tobacco: Never Used  Substance and Sexual Activity  . Alcohol use: No  . Drug use: No  . Sexual activity: Not on file  Lifestyle  . Physical activity:    Days per week: Not on file    Minutes per session: Not on file  . Stress: Not on file  Relationships  . Social connections:    Talks on phone: Not on file    Gets together: Not on file    Attends religious service: Not on file    Active member of club or organization: Not on file    Attends meetings of clubs or  organizations: Not on file    Relationship status: Not on file  Other Topics Concern  . Not on file  Social History Narrative  . Not on file     FAMILY HISTORY:  We obtained a detailed, 4-generation family history.  Significant diagnoses are listed below: Family History  Problem Relation Age of Onset  . Hypertension Mother   . Heart disease Mother   . Stroke Mother   . Diabetes Mother   . Pancreatic cancer Mother   . Heart disease Father   . Hypertension Father   . Colon cancer Maternal Uncle        dx >50; d. 96  . Parkinson's disease Sister   . Other Maternal Aunt        infant death  . Other Paternal Aunt        infant death  . Heart disease Maternal Grandfather   . Heart disease Paternal Grandfather   . Esophageal cancer Neg Hx   . Stomach cancer Neg Hx   . Rectal cancer Neg Hx     The patient has 8 children, five boys and three girls.  All are cancer free.  He has a sister and brother who are cancer free.  His sister had Parkinson's Disease.  Both parents are deceased.  The patient's mother had pancreatic cancer diagnosed at 44.  She had a full sister who died soon after birth, and two full brothers and a maternal half brother.  One full brother was diagnosed with colon cancer.  Both maternal grandparents are deceased.  The patient's father died at 69 from heart problems. He had two sisters, one who died shortly after birth and the other who died of heart problems in her 57's.  The paternal grandparents are deceased from non cancer related issues.  Tristan Hughes is unaware of previous family history of genetic testing for hereditary cancer risks. Patient's maternal ancestors are of [place] and [place] descent, and paternal ancestors are of [place], [place] and [place] descent. There is no reported Ashkenazi Jewish ancestry. There is no known consanguinity.  GENETIC COUNSELING ASSESSMENT: Tristan Hughes is a 78 y.o. male with a personal and family history of cancer which is  somewhat suggestive of a hereditary cancer syndrome, such as Lynch syndrome, and predisposition to cancer. We, therefore, discussed and recommended the following at today's visit.   DISCUSSION: We discussed that 3 - 5% of gastric cancer is hereditary, with most cases associated with Lynch syndrome.  There are other genes that can be associated with hereditary gastric cancer syndromes.  These  include CDH1 and the SDHx genes.  We discussed that the risk for identifying a hereditary mutation is low, based on the ages of onset  We discussed that testing is beneficial for several reasons including knowing how to follow individuals after completing their treatment, identifying whether potential treatment options such as immunotherapy or other systemic therapy would be beneficial, and understand if other family members could be at risk for cancer and allow them to undergo genetic testing.   We reviewed the characteristics, features and inheritance patterns of hereditary cancer syndromes. We also discussed genetic testing, including the appropriate family members to test, the process of testing, insurance coverage and turn-around-time for results. We discussed the implications of a negative, positive and/or variant of uncertain significant result. We recommended Tristan Hughes pursue genetic testing for the common hereditary cancer gene panel. The Common Hereditary Gene Panel offered by [facility] includes sequencing and/or deletion duplication testing of the following 48 genes: APC, ATM, AXIN2, BARD1, BMPR1A, BRCA1, BRCA2, BRIP1, CDH1, CDK4, CDKN2A (p14ARF), CDKN2A (p16INK4a), CHEK2, CTNNA1, DICER1, EPCAM (Deletion/duplication testing only), GREM1 (promoter region deletion/duplication testing only), KIT, MEN1, MLH1, MSH2, MSH3, MSH6, MUTYH, NBN, NF1, NHTL1, PALB2, PDGFRA, PMS2, POLD1, POLE, PTEN, RAD50, RAD51C, RAD51D, RNF43, SDHB, SDHC, SDHD, SMAD4, SMARCA4. STK11, TP53, TSC1, TSC2, and VHL.  The following genes were  evaluated for sequence changes only: SDHA and HOXB13 c.251G>A variant only.   Based on Tristan Hughes personal and family history of cancer, he meets medical criteria for genetic testing. Despite that he meets criteria, he may still have an out of pocket cost. We discussed that if his out of pocket cost for testing is over $100, the laboratory will call and confirm whether he wants to proceed with testing.  If the out of pocket cost of testing is less than $100 he will be billed by the genetic testing laboratory.    PLAN: After considering the risks, benefits, and limitations, Tristan Hughes provided informed consent to pursue genetic testing and the blood sample was sent to [facility] for analysis of the common hereditary cancer panel. Results should be available within approximately 2-3 weeks' time, at which point they will be disclosed by telephone to Tristan Hughes, as will any additional recommendations warranted by these results. Tristan Hughes will receive a summary of his genetic counseling visit and a copy of his results once available. This information will also be available in Epic.   Lastly, we encouraged Tristan Hughes to remain in contact with cancer genetics annually so that we can continuously update the family history and inform him of any changes in cancer genetics and testing that may be of benefit for this family.   Tristan Hughes questions were answered to his satisfaction today. Our contact information was provided should additional questions or concerns arise. Thank you for the referral and allowing [place] to share in the care of your patient.   Karen P. Florene Glen, [place], [facility] Certified Genetic Counselor [email or web address] phone: [phone]  The patient was seen for a total of 35 minutes in face-to-face genetic counseling.  This patient was discussed with Drs. Magrinat, Lindi Adie and/or Burr Medico who agrees with the above.     _______________________________________________________________________ For Office Staff:  Number of people involved in session: 1 Was an Intern/ student involved with case: no

## 2018-09-04 NOTE — Progress Notes (Signed)
[facility] OFFICE PROGRESS NOTE  Patient Care Team: Tristan Post, MD as PCP - Tristan Nakayama, MD as Consulting Physician (Hematology) Tristan Klein, MD as Consulting Physician (General Surgery)  HEME/ONC OVERVIEW: 1. 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 -03/2018: presented to GI for evaluation of persistent epigastric pain; EGD showed an infiltrating and ulcerated, non-circumferential, friable mass involving the lesser curvature of the stomach and the cardia measuring approximately 4 x 2 cm, one superficial distal esophageal ulcer thought to be in the extension of the ulcerated gastric mass; no stigmata of bleeding; biopsy of GEJ ulcer showed poorly differentiated adenocarcinoma, favoring gastric primary; CT CAP showed known GEJ/gastric cardia adenocarcinoma w/ small adjacent LN's, concerning for nodal mets, no distant mets identified  -05/2018: PET showed FDG-avid gastric cardia mass extending slightly into the distal esophagus (SUV 11.2), two small adjacent R gastric LN's with borderline FDG avidity; no evidence of metastasis; LN not amendable for biopsy by EUS per GI  -Late 05/2018 - 06/2018: peri-operative FOLFOX (pre-operative portion) -09/2018: gastrectomy tentatively scheduled by Tristan [facility]  2. 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 -S/p pre-operative FOLFOX x 3 cycles; patient tolerated chemotherapy relatively well except neutropenia (bolus 5-FU omitted)  -Interim CT Hughes pre-operative chemo showed goog resposne -Patient is scheduled for surgery tentatively on 10/01/2018  -Pending the recovery after the surgery, we will determine the date of Hughes-operative chemotherapy; we will plan to omit bolus 5-FU in light of chemotherapy-associated neutropenia   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 -I independently reviewed the radiologic images of the recent dotatate PET, and agree with findings as documented -In summary, dotatate PET showed an intense focus of radiotracer activity within the body of the pancreas, spleen with a small well-differentiated neuroendocrine tumor; there was no evidence of metastatic disease -I discussed the case at length with Tristan Hughes, who was going to consider whether to remove the pancreatic lesion during the surgery for gastric cancer or monitor it clinically -I also discussed with patient about other management options, including close observation vs. SBRT if the lesion is not removed surgically due to the risk of complications  Port-a-cath in place -Plan for q6-8week for now, next due in late 09/2018  No orders of the defined types were placed in this encounter.  All questions were answered. The patient knows to call the clinic with any problems, questions or concerns. No barriers to learning was detected.  A total of more than 25 minutes were spent face-to-face with the patient during this encounter and over half of that time was spent on counseling and coordination of care as outlined above.   Return in late 09/2018 for labs, port flush, pathology results and clinic appointment. Tristan Men, MD 09/04/2018 10:54 AM  CHIEF COMPLAINT: "My beard is growing back"  INTERVAL HISTORY: *[facility] Hughes  returns to clinic for follow-up  of adenocarcinoma of the lesser curvature of the stomach.  Patient reports that since completing chemotherapy, his energy level has returned, and his hair is falling back.  He denies any fever, chill, weight loss, night sweats, chest pain, dyspnea, abdominal pain, nausea, vomiting, diarrhea is tentatively scheduled for 10/01/2018.  SUMMARY OF ONCOLOGIC HISTORY:   Gastric adenocarcinoma ([facility])   04/22/2018 Procedure    EGD: - An infiltrative and ulcerated, non-circumferential, friable mass with no bleeding and no stigmata of recent bleeding involving the lesser curvature of the stomach and the cardia measuring approximately 4 cm x 2 cm. Biopsies were taken with a cold forceps for histology. Findings: - The exam of the stomach was otherwise normal. - One superficial distal esophageal ulcer which was an extention of the ulcerated gastric mass with no bleeding and no stigmata of recent bleeding was found 41 cm from the incisors at the EGJ . - The exam of the esophagus was otherwise normal. - A small non-bleeding diverticulum was found in the second portion of the duodenum. - The exam of the duodenum was otherwise normal.    04/22/2018 Pathology Results    Accession: [id number]  Diagnosis Surgical [P], GE junction-ulcerated lesion - POORLY DIFFERENTIATED ADENOCARCINOMA. - SEE COMMENT. Microscopic Comment The features slightly favor a gastric primary rather than an esophageal primary    04/29/2018 Imaging    CT CAP: IMPRESSION: 1. Irregular wall thickening of the proximal stomach extending from the gastroesophageal junction into the gastric cardia suspicious for infiltrative neoplasm given provided clinical history. Adjacent prominent lymph nodes, possibly small nodal metastases. No distant metastases identified. 2. No suspicious hepatic findings. 3. Posttraumatic right rib findings with probable incidental lesion involving the posterior aspect of the  right 4th rib. No suspicious pulmonary nodules. 4. Evidence of prior granulomatous disease with calcified hilar and upper abdominal lymph nodes and calcified splenic granulomas. 5.  Aortic Atherosclerosis (ICD10-I70.0).    05/21/2018 Imaging    PET: IMPRESSION: 1. Hypermetabolic mass primarily in the gastric cardia region extending slightly into the distal esophagus, maximum SUV 11.2, compatible with malignancy. Two small adjacent right gastric lymph nodes are indistinct and not definitively hypermetabolic but could possibly be involved. No findings of metastatic disease to the liver or elsewhere. 2. Prominent physiologic activity in small and large bowel, without CT correlate. 3. No hypermetabolic rib lesion. 4. Other imaging findings of potential clinical significance: Mild thyroid goiter. Aortic Atherosclerosis (ICD10-I70.0). Mild-to-moderate cardiomegaly. Coronary atherosclerosis. Atrophic right infraspinatus muscle. Pleural calcifications, left greater than right. Prominent stool throughout the colon favors constipation. Lower lumbar spondylosis. Old granulomatous disease    05/29/2018 -  Chemotherapy    The patient had palonosetron (ALOXI) injection 0.25 mg, 0.25 mg, Intravenous,  Once, 3 of 6 cycles Administration: 0.25 mg (05/29/2018), 0.25 mg (06/12/2018), 0.25 mg (07/04/2018) leucovorin 868 mg in dextrose 5 % 250 mL infusion, 400 mg/m2 = 868 mg, Intravenous,  Once, 3 of 6 cycles Administration: 868 mg (05/29/2018), 868 mg (06/12/2018), 868 mg (07/04/2018) oxaliplatin (ELOXATIN) 185 mg in dextrose 5 % 500 mL chemo infusion, 85 mg/m2 = 185 mg, Intravenous,  Once, 3 of 6 cycles Administration: 185 mg (05/29/2018), 185 mg (06/12/2018), 185 mg (07/04/2018) fluorouracil (ADRUCIL) chemo injection 850 mg, 400 mg/m2 = 850 mg, Intravenous,  Once, 2 of 2 cycles Administration: 850 mg (05/29/2018), 850 mg (06/12/2018) fluorouracil (ADRUCIL) 5,200 mg in sodium chloride 0.9 % 146 mL chemo infusion,  2,400 mg/m2 = 5,200 mg, Intravenous, 1 Day/Dose, 3 of 6 cycles Administration: 5,200 mg (  05/29/2018), 5,200 mg (06/12/2018), 5,200 mg (07/04/2018)  for chemotherapy treatment.      REVIEW OF SYSTEMS:   Constitutional: ( - ) fevers, ( - )  chills , ( - ) night sweats Eyes: ( - ) blurriness of vision, ( - ) double vision, ( - ) watery eyes Ears, nose, mouth, throat, and face: ( - ) mucositis, ( - ) sore throat Respiratory: ( - ) cough, ( - ) dyspnea, ( - ) wheezes Cardiovascular: ( - ) palpitation, ( - ) chest discomfort, ( - ) lower extremity swelling Gastrointestinal:  ( - ) nausea, ( - ) heartburn, ( - ) change in bowel habits Skin: ( - ) abnormal skin rashes Lymphatics: ( - ) new lymphadenopathy, ( - ) easy bruising Neurological: ( - ) numbness, ( - ) tingling, ( - ) new weaknesses Behavioral/Psych: ( - ) mood change, ( - ) new changes  All other systems were reviewed with the patient and are negative.  I have reviewed the past medical history, past surgical history, social history and family history with the patient and they are unchanged from previous note.  ALLERGIES:  is allergic to naproxen sodium.  MEDICATIONS:  Current Outpatient Medications  Medication Sig Dispense Refill  . amLODipine (NORVASC) 5 MG tablet TAKE 1 TABLET DAILY (PLEASE SCHEDULE A PHYSICAL FOR MORE REFILLS) (Patient taking differently: Take 5 mg by mouth every evening. ) 90 tablet 1  . Ascorbic Acid (VITAMIN C) 1000 MG tablet Take 1,000 mg by mouth every evening.     Marland Kitchen aspirin EC 81 MG tablet Take 81 mg by mouth every evening.    Marland Kitchen b complex vitamins tablet Take 1 tablet by mouth every evening.     . calcium carbonate (TUMS - DOSED IN MG ELEMENTAL CALCIUM) 500 MG chewable tablet Chew 1-2 tablets by mouth 2 (two) times daily as needed (acid reflux/indigestion.).     Marland Kitchen Cholecalciferol (VITAMIN D-3) 125 MCG (5000 UT) TABS Take 5,000 Units by mouth daily.    Marland Kitchen dexamethasone (DECADRON) 4 MG tablet Take 2 tablets (8 mg  total) by mouth daily. Start the day after chemotherapy for 2 days. Take with food. (Patient taking differently: Take 8 mg by mouth See admin instructions. Start the day after chemotherapy for 2 days. Take with food.) 30 tablet 1  . ezetimibe (ZETIA) 10 MG tablet TAKE 1 TABLET DAILY 90 tablet 1  . furosemide (LASIX) 20 MG tablet TAKE 1 TABLET TWICE A DAY (Patient taking differently: Take 20 mg by mouth 2 (two) times daily. ) 180 tablet 3  . Horse Chestnut 300 MG CAPS Take 300 mg by mouth 2 (two) times daily.    . insulin lispro (HUMALOG) 100 UNIT/ML injection Inject 0.15-0.18 mLs (15-18 Units total) into the skin 3 (three) times daily before meals. 50 mL 2  . Insulin Syringe-Needle U-100 (B-D INS SYR HALF-UNIT .3CC/31G) 31G X 5/16" 0.3 ML MISC by Does not apply route.    . Insulin Syringe-Needle U-100 31G X 5/16" 1 ML MISC Use once daily 100 each 3  . LANTUS 100 UNIT/ML injection INJECT 46 UNITS UNDER THE SKIN AT BEDTIME (Patient taking differently: Inject 46 Units into the skin at bedtime. ) 50 mL 5  . lidocaine-prilocaine (EMLA) cream Apply to affected area once (Patient taking differently: Apply 1 application topically daily as needed (prior to port being accessed.). ) 30 g 3  . lisinopril (PRINIVIL,ZESTRIL) 20 MG tablet Take 10 mg by mouth daily.    Marland Kitchen  LORazepam (ATIVAN) 0.5 MG tablet Take 1 tablet (0.5 mg total) by mouth every 6 (six) hours as needed (Nausea or vomiting). 30 tablet 0  . metFORMIN (GLUCOPHAGE) 1000 MG tablet TAKE 1 TABLET TWICE A DAY WITH MEALS (Patient taking differently: Take 1,000 mg by mouth 2 (two) times daily. ) 180 tablet 4  . metoprolol tartrate (LOPRESSOR) 50 MG tablet TAKE ONE AND ONE-HALF TABLETS TWICE A DAY (SCHEDULE A PHYSICAL FOR MORE REFILLS) (Patient taking differently: Take 75 mg by mouth 2 (two) times daily. ) 270 tablet 4  . Multiple Vitamin (MULTIVITAMIN WITH MINERALS) TABS tablet Take 1 tablet by mouth every evening.    . mupirocin ointment (BACTROBAN) 2 % Place  1 application into the nose 2 (two) times daily. Apply to left second toe once daily (Patient taking differently: Apply 1 application topically once. Apply to left second toe once daily) 22 g 0  . Omega-3 Fatty Acids (FISH OIL) 1200 MG CAPS Take 1,200 mg by mouth 2 (two) times daily.     Marland Kitchen omeprazole (PRILOSEC) 40 MG capsule Take 1 capsule (40 mg total) by mouth 2 (two) times daily. 180 capsule 1  . prochlorperazine (COMPAZINE) 10 MG tablet Take 1 tablet (10 mg total) by mouth every 6 (six) hours as needed (Nausea or vomiting). 30 tablet 1  . rosuvastatin (CRESTOR) 20 MG tablet TAKE 1 TABLET DAILY (PLEASE SCHEDULE A PHYSICAL FOR MORE REFILLS) (Patient taking differently: Take 20 mg by mouth every evening. ) 90 tablet 3  . traMADol (ULTRAM) 50 MG tablet Take 50 mg by mouth 3 (three) times daily as needed (pain).     . vitamin E (VITAMIN E) 400 UNIT capsule Takes one tablet by mouth daily. (Patient taking differently: Take 400 Units by mouth daily. Takes one tablet by mouth daily.)     No current facility-administered medications for this visit.     PHYSICAL EXAMINATION: ECOG PERFORMANCE STATUS: 1 - Symptomatic but completely ambulatory  Today's Vitals   09/04/18 0950 09/04/18 0958  BP: 117/66   Pulse: (!) 58   Resp: 18   Temp: 97.8 F (36.6 C)   TempSrc: Oral   SpO2: 97%   Weight: 212 lb 6.4 oz (96.3 kg)   Height: 5\' 9"  (1.753 m)   PainSc:  0-No pain   Body mass index is 31.37 kg/m.  Filed Weights   09/04/18 0950  Weight: 212 lb 6.4 oz (96.3 kg)    GENERAL: alert, no distress and comfortable SKIN: skin color, texture, turgor are normal, no rashes or significant lesions EYES: conjunctiva are pink and non-injected, sclera clear OROPHARYNX: no exudate, no erythema; lips, buccal mucosa, and tongue normal  NECK: supple, non-tender LYMPH:  no palpable lymphadenopathy in the cervical LUNGS: clear to auscultation with normal breathing effort HEART: regular rate & rhythm and no murmurs  and no lower extremity edema ABDOMEN: soft, non-tender, non-distended, normal bowel sounds Musculoskeletal: no cyanosis of digits and no clubbing  PSYCH: alert & oriented x 3, fluent speech NEURO: no focal motor/sensory deficits  LABORATORY DATA:  I have reviewed the data as listed    Component Value Date/Time   NA 143 09/04/2018 0926   K 3.6 09/04/2018 0926   CL 106 09/04/2018 0926   CO2 28 09/04/2018 0926   GLUCOSE 49 (L) 09/04/2018 0926   BUN 20 09/04/2018 0926   CREATININE 0.87 09/04/2018 0926   CALCIUM 8.7 (L) 09/04/2018 0926   PROT 6.7 09/04/2018 0926   ALBUMIN 3.6 09/04/2018 0926  AST 21 09/04/2018 0926   ALT 18 09/04/2018 0926   ALKPHOS 57 09/04/2018 0926   BILITOT 0.7 09/04/2018 0926   GFRNONAA >60 09/04/2018 0926   GFRAA >60 09/04/2018 0926    No results found for: SPEP, UPEP  Lab Results  Component Value Date   WBC 6.9 09/04/2018   NEUTROABS 5.0 09/04/2018   HGB 13.5 09/04/2018   HCT 42.3 09/04/2018   MCV 94.0 09/04/2018   PLT 172 09/04/2018      Chemistry      Component Value Date/Time   NA 143 09/04/2018 0926   K 3.6 09/04/2018 0926   CL 106 09/04/2018 0926   CO2 28 09/04/2018 0926   BUN 20 09/04/2018 0926   CREATININE 0.87 09/04/2018 0926      Component Value Date/Time   CALCIUM 8.7 (L) 09/04/2018 0926   ALKPHOS 57 09/04/2018 0926   AST 21 09/04/2018 0926   ALT 18 09/04/2018 0926   BILITOT 0.7 09/04/2018 0926       RADIOGRAPHIC STUDIES: I have personally reviewed the radiological images as listed below and agreed with the findings in the report. Nm Pet (netspot Ga 68 Dotatate) Skull Base To Mid Thigh  Result Date: 08/17/2018 CLINICAL DATA:  Neuroendocrine tumor of unknown primary. EXAM: NUCLEAR MEDICINE PET SKULL BASE TO THIGH TECHNIQUE: 5.0 mCi Ga 39 DOTATATE was injected intravenously. Full-ring PET imaging was performed from the skull base to thigh after the radiotracer. CT data was obtained and used for attenuation correction and  anatomic localization. COMPARISON:  PET-CT 05/21/2018, CT CAP 04/29/2018 FINDINGS: NECK No radiotracer activity in neck lymph nodes. Incidental CT findings: None CHEST No radiotracer accumulation within mediastinal or hilar lymph nodes. No suspicious pulmonary nodules on the CT scan. Incidental CT finding:Port in the anterior chest wall with tip in distal SVC. Coronary artery calcification and aortic atherosclerotic calcification. ABDOMEN/PELVIS There is a small focus of intense radiotracer activity within the mid pancreas with SUV max equal 75. This corresponds to a small nodular lesion measuring approximately 1 cm (image 116/4). This lesion is not readily apparent on the noncontrast CT or the comparison contrast enhanced CT. In retrospect there is potentially a nodule on coronal image 84/4 of the contrast enhanced CT which corresponds to lesion. No abnormal radiotracer associated with the stomach. No abnormal activity in the liver. No additional lesions within the bowel. No radiotracer activity associated with abdominopelvic lymph nodes. Physiologic activity noted in the liver, spleen, adrenal glands and kidneys. Incidental CT findings:None SKELETON No focal activity to suggest skeletal metastasis. Incidental CT findings:Thoracotomy defects along the RIGHT lateral chest wall. IMPRESSION: 1. Intense focus of radiotracer activity within the body the pancreas consistent with a small well differentiated neuroendocrine tumor. 2. No additional evidence of neuroendocrine tumor on skull base to thigh PET scan. Electronically Signed   By: Suzy Bouchard M.D.   On: 08/17/2018 04:57

## 2018-09-04 NOTE — Telephone Encounter (Signed)
Called and scheduled appt per 5/6 los.  Patient spouse aware of the appt date and time.

## 2018-09-09 ENCOUNTER — Ambulatory Visit: Payer: Medicare Other | Admitting: Orthotics

## 2018-09-09 ENCOUNTER — Other Ambulatory Visit: Payer: Self-pay

## 2018-09-09 DIAGNOSIS — E1142 Type 2 diabetes mellitus with diabetic polyneuropathy: Secondary | ICD-10-CM

## 2018-09-09 DIAGNOSIS — B351 Tinea unguium: Secondary | ICD-10-CM

## 2018-09-09 DIAGNOSIS — S91115A Laceration without foreign body of left lesser toe(s) without damage to nail, initial encounter: Secondary | ICD-10-CM

## 2018-09-09 DIAGNOSIS — Z800 Family history of malignant neoplasm of digestive organs: Secondary | ICD-10-CM

## 2018-09-09 DIAGNOSIS — M79675 Pain in left toe(s): Secondary | ICD-10-CM

## 2018-09-09 NOTE — Progress Notes (Signed)
Due to finacial considerations, patient decided not to pursue diabetic shoes at this time.

## 2018-09-11 ENCOUNTER — Encounter: Payer: Self-pay | Admitting: Hematology

## 2018-09-12 ENCOUNTER — Encounter: Payer: Self-pay | Admitting: *Deleted

## 2018-09-12 ENCOUNTER — Telehealth: Payer: Self-pay

## 2018-09-12 NOTE — Telephone Encounter (Signed)
Called and spoke to patient to let him know that a message was sent to [facility] Administrator to see if she can figure out why patient was marked a no show for this lab appointment on 09/04/18.

## 2018-09-13 ENCOUNTER — Telehealth: Payer: Self-pay | Admitting: Genetic Counselor

## 2018-09-13 ENCOUNTER — Encounter: Payer: Self-pay | Admitting: Genetic Counselor

## 2018-09-13 ENCOUNTER — Ambulatory Visit: Payer: Self-pay | Admitting: Genetic Counselor

## 2018-09-13 DIAGNOSIS — Z1379 Encounter for other screening for genetic and chromosomal anomalies: Secondary | ICD-10-CM

## 2018-09-13 NOTE — Progress Notes (Signed)
HPI:  Mr. Pattillo was previously seen in the [facility] due to a personal and family history of cancer and concerns regarding a hereditary predisposition to cancer. Please refer to our prior cancer genetics clinic note for more information regarding our discussion, assessment and recommendations, at the time. Mr. Dewald recent genetic test results were disclosed to him, as were recommendations warranted by these results. These results and recommendations are discussed in more detail below.  CANCER HISTORY:    Gastric adenocarcinoma ([place])   04/22/2018 Procedure    EGD: - An infiltrative and ulcerated, non-circumferential, friable mass with no bleeding and no stigmata of recent bleeding involving the lesser curvature of the stomach and the cardia measuring approximately 4 cm x 2 cm. Biopsies were taken with a cold forceps for histology. Findings: - The exam of the stomach was otherwise normal. - One superficial distal esophageal ulcer which was an extention of the ulcerated gastric mass with no bleeding and no stigmata of recent bleeding was found 41 cm from the incisors at the EGJ . - The exam of the esophagus was otherwise normal. - A small non-bleeding diverticulum was found in the second portion of the duodenum. - The exam of the duodenum was otherwise normal.    04/22/2018 Pathology Results    Accession: [id number]  Diagnosis Surgical [P], GE junction-ulcerated lesion - POORLY DIFFERENTIATED ADENOCARCINOMA. - SEE COMMENT. Microscopic Comment The features slightly favor a gastric primary rather than an esophageal primary    04/29/2018 Imaging    CT CAP: IMPRESSION: 1. Irregular wall thickening of the proximal stomach extending from the gastroesophageal junction into the gastric cardia suspicious for infiltrative neoplasm given provided clinical history. Adjacent prominent lymph nodes, possibly small nodal metastases. No distant metastases  identified. 2. No suspicious hepatic findings. 3. Posttraumatic right rib findings with probable incidental lesion involving the posterior aspect of the right 4th rib. No suspicious pulmonary nodules. 4. Evidence of prior granulomatous disease with calcified hilar and upper abdominal lymph nodes and calcified splenic granulomas. 5.  Aortic Atherosclerosis (ICD10-I70.0).    05/21/2018 Imaging    PET: IMPRESSION: 1. Hypermetabolic mass primarily in the gastric cardia region extending slightly into the distal esophagus, maximum SUV 11.2, compatible with malignancy. Two small adjacent right gastric lymph nodes are indistinct and not definitively hypermetabolic but could possibly be involved. No findings of metastatic disease to the liver or elsewhere. 2. Prominent physiologic activity in small and large bowel, without CT correlate. 3. No hypermetabolic rib lesion. 4. Other imaging findings of potential clinical significance: Mild thyroid goiter. Aortic Atherosclerosis (ICD10-I70.0). Mild-to-moderate cardiomegaly. Coronary atherosclerosis. Atrophic right infraspinatus muscle. Pleural calcifications, left greater than right. Prominent stool throughout the colon favors constipation. Lower lumbar spondylosis. Old granulomatous disease    05/29/2018 -  Chemotherapy    The patient had palonosetron (ALOXI) injection 0.25 mg, 0.25 mg, Intravenous,  Once, 3 of 6 cycles Administration: 0.25 mg (05/29/2018), 0.25 mg (06/12/2018), 0.25 mg (07/04/2018) leucovorin 868 mg in dextrose 5 % 250 mL infusion, 400 mg/m2 = 868 mg, Intravenous,  Once, 3 of 6 cycles Administration: 868 mg (05/29/2018), 868 mg (06/12/2018), 868 mg (07/04/2018) oxaliplatin (ELOXATIN) 185 mg in dextrose 5 % 500 mL chemo infusion, 85 mg/m2 = 185 mg, Intravenous,  Once, 3 of 6 cycles Administration: 185 mg (05/29/2018), 185 mg (06/12/2018), 185 mg (07/04/2018) fluorouracil (ADRUCIL) chemo injection 850 mg, 400 mg/m2 = 850 mg, Intravenous,   Once, 2 of 2 cycles Administration: 850 mg (05/29/2018), 850 mg (06/12/2018)  fluorouracil (ADRUCIL) 5,200 mg in sodium chloride 0.9 % 146 mL chemo infusion, 2,400 mg/m2 = 5,200 mg, Intravenous, 1 Day/Dose, 3 of 6 cycles Administration: 5,200 mg (05/29/2018), 5,200 mg (06/12/2018), 5,200 mg (07/04/2018)  for chemotherapy treatment.     09/12/2018 Genetic Testing    Negative genetic testing on the common hereditary cancer panel.  The Common Hereditary Gene Panel offered by [facility] includes sequencing and/or deletion duplication testing of the following 48 genes: APC, ATM, AXIN2, BARD1, BMPR1A, BRCA1, BRCA2, BRIP1, CDH1, CDK4, CDKN2A (p14ARF), CDKN2A (p16INK4a), CHEK2, CTNNA1, DICER1, EPCAM (Deletion/duplication testing only), GREM1 (promoter region deletion/duplication testing only), KIT, MEN1, MLH1, MSH2, MSH3, MSH6, MUTYH, NBN, NF1, NHTL1, PALB2, PDGFRA, PMS2, POLD1, POLE, PTEN, RAD50, RAD51C, RAD51D, RNF43, SDHB, SDHC, SDHD, SMAD4, SMARCA4. STK11, TP53, TSC1, TSC2, and VHL.  The following genes were evaluated for sequence changes only: SDHA and HOXB13 c.251G>A variant only. The report date is Sep 12, 2018.     FAMILY HISTORY:  We obtained a detailed, 4-generation family history.  Significant diagnoses are listed below: Family History  Problem Relation Age of Onset   Hypertension Mother    Heart disease Mother    Stroke Mother    Diabetes Mother    Pancreatic cancer Mother    Heart disease Father    Hypertension Father    Colon cancer Maternal Uncle        dx >50; d. 58   Parkinson's disease Sister    Other Maternal Aunt        infant death   Other Paternal Aunt        infant death   Heart disease Maternal Grandfather    Heart disease Paternal Grandfather    Esophageal cancer Neg Hx    Stomach cancer Neg Hx    Rectal cancer Neg Hx     The patient has 8 children, five boys and three girls.  All are cancer free.  He has a sister and brother who are cancer free.  His sister  had Parkinson's Disease.  Both parents are deceased.  The patient's mother had pancreatic cancer diagnosed at 56.  She had a full sister who died soon after birth, and two full brothers and a maternal half brother.  One full brother was diagnosed with colon cancer.  Both maternal grandparents are deceased.  The patient's father died at 56 from heart problems. He had two sisters, one who died shortly after birth and the other who died of heart problems in her 59's.  The paternal grandparents are deceased from non cancer related issues.  Mr. Renteria is unaware of previous family history of genetic testing for hereditary cancer risks. Patient's maternal ancestors are of [place] and [place] descent, and paternal ancestors are of [place], [place] and [place] descent. There is no reported Ashkenazi Jewish ancestry. There is no known consanguinity.   GENETIC TEST RESULTS: Genetic testing reported out on Sep 13, 2018 through the common hereditary cancer panel cancer panel found no pathogenic mutations. The Common Hereditary Gene Panel offered by [facility] includes sequencing and/or deletion duplication testing of the following 48 genes: APC, ATM, AXIN2, BARD1, BMPR1A, BRCA1, BRCA2, BRIP1, CDH1, CDK4, CDKN2A (p14ARF), CDKN2A (p16INK4a), CHEK2, CTNNA1, DICER1, EPCAM (Deletion/duplication testing only), GREM1 (promoter region deletion/duplication testing only), KIT, MEN1, MLH1, MSH2, MSH3, MSH6, MUTYH, NBN, NF1, NHTL1, PALB2, PDGFRA, PMS2, POLD1, POLE, PTEN, RAD50, RAD51C, RAD51D, RNF43, SDHB, SDHC, SDHD, SMAD4, SMARCA4. STK11, TP53, TSC1, TSC2, and VHL.  The following genes were evaluated for sequence changes only: SDHA and  HOXB13 c.251G>A variant only. The test report has been scanned into EPIC and is located under the Molecular Pathology section of the Results Review tab.  A portion of the result report is included below for reference.     We discussed with Mr. Whittenburg that because current genetic testing is not  perfect, it is possible there may be a gene mutation in one of these genes that current testing cannot detect, but that chance is small.  We also discussed, that there could be another gene that has not yet been discovered, or that we have not yet tested, that is responsible for the cancer diagnoses in the family. It is also possible there is a hereditary cause for the cancer in the family that Mr. Mestre did not inherit and therefore was not identified in his testing.  Therefore, it is important to remain in touch with cancer genetics in the future so that we can continue to offer Mr. Rzepka the most up to date genetic testing.   ADDITIONAL GENETIC TESTING: We discussed with Mr. Cordts that there are other genes that are associated with increased cancer risk that can be analyzed. Should Mr. Whitenight wish to pursue additional genetic testing, we are happy to discuss and coordinate this testing, at any time.    CANCER SCREENING RECOMMENDATIONS: Mr. Liwanag test result is considered negative (normal).  This means that we have not identified a hereditary cause for his personal and family history of cancer at this time. Most cancers happen by chance and this negative test suggests that his cancer may fall into this category.    While reassuring, this does not definitively rule out a hereditary predisposition to cancer. It is still possible that there could be genetic mutations that are undetectable by current technology. There could be genetic mutations in genes that have not been tested or identified to increase cancer risk.  Therefore, it is recommended he continue to follow the cancer management and screening guidelines provided by his oncology and primary healthcare provider.   An individual's cancer risk and medical management are not determined by genetic test results alone. Overall cancer risk assessment incorporates additional factors, including personal medical history, family history, and any  available genetic information that may result in a personalized plan for cancer prevention and surveillance  RECOMMENDATIONS FOR FAMILY MEMBERS:  Individuals in this family might be at some increased risk of developing cancer, over the general population risk, simply due to the family history of cancer.  We recommended women in this family have a yearly mammogram beginning at age 29, or 59 years younger than the earliest onset of cancer, an annual clinical breast exam, and perform monthly breast self-exams. Women in this family should also have a gynecological exam as recommended by their primary provider. All family members should have a colonoscopy by age 62.  FOLLOW-UP: Lastly, we discussed with Mr. Theard that cancer genetics is a rapidly advancing field and it is possible that new genetic tests will be appropriate for him and/or his family members in the future. We encouraged him to remain in contact with cancer genetics on an annual basis so we can update his personal and family histories and let him know of advances in cancer genetics that may benefit this family.   Our contact number was provided. Mr. Piacentini questions were answered to his satisfaction, and he knows he is welcome to call us at anytime with additional questions or concerns.   Roma Kayser, MS, [facility] Certified Genetic Counselor  [email or web address]

## 2018-09-13 NOTE — Telephone Encounter (Signed)
Revealed negative genetic testing.  Discussed that we do not know why he has breast cancer or why there is cancer in the family. It could be due to a different gene that we are not testing, or maybe our current technology may not be able to pick something up.  It will be important for him to keep in contact with genetics to keep up with whether additional testing may be needed.  

## 2018-09-16 ENCOUNTER — Other Ambulatory Visit: Payer: Self-pay

## 2018-09-16 ENCOUNTER — Ambulatory Visit (INDEPENDENT_AMBULATORY_CARE_PROVIDER_SITE_OTHER): Payer: Medicare Other | Admitting: Family Medicine

## 2018-09-16 DIAGNOSIS — E119 Type 2 diabetes mellitus without complications: Secondary | ICD-10-CM | POA: Diagnosis not present

## 2018-09-16 DIAGNOSIS — I25810 Atherosclerosis of coronary artery bypass graft(s) without angina pectoris: Secondary | ICD-10-CM

## 2018-09-16 DIAGNOSIS — C169 Malignant neoplasm of stomach, unspecified: Secondary | ICD-10-CM | POA: Diagnosis not present

## 2018-09-16 DIAGNOSIS — Z794 Long term (current) use of insulin: Secondary | ICD-10-CM

## 2018-09-16 DIAGNOSIS — I10 Essential (primary) hypertension: Secondary | ICD-10-CM | POA: Diagnosis not present

## 2018-09-16 NOTE — Progress Notes (Signed)
Patient ID: Tristan Hughes, male   DOB: [birth year], 78 y.o.   MRN: [id number]  This visit type was conducted due to national recommendations for restrictions regarding the COVID-19 pandemic in an effort to limit this patient's exposure and mitigate transmission in our community.   Virtual Visit via Video Note  I connected with Owenn "Norm" Genis on 09/16/18 at  4:00 PM EDT by a video enabled telemedicine application and verified that I am speaking with the correct person using two identifiers.  Location patient: home Location provider:work or home office Persons participating in the virtual visit: patient's wife, provider, and patient's daughter Lou Miner)  I discussed the limitations of evaluation and management by telemedicine and the availability of in person appointments. The patient expressed understanding and agreed to proceed.   HPI: This consultation was at request of patient's daughter and wife.  He has chronic problems including history of hypertension, CAD, obstructive sleep apnea, gastric adenocarcinoma, type 2 diabetes, hyperlipidemia, obesity.  Patient is scheduled for subtotal versus total gastrectomy June 2 with questionable distal pancreatectomy.  Family had the following concerns  Type 2 diabetes.  History of good control.  Last A1c 5.9%.  This was back in January.  He takes metformin, Lantus 46 units once daily, and Humalog 15 to 18 units 3 times daily with meals.  Family has concerns because he has been very "up and down "with his blood sugars and very erratic with his eating.  He tends to binge at times.  They give some blood sugar readings and these vary considerably from a couple of lows in the 40s the highs in the 150-160 range. They have some valid concerns because of concerns regulating his blood sugars with upcoming partial versus total gastrectomy.  He has not seen endocrinology.  He will have a feeding tube placed temporarily post surgery  Family specifically would  like to see if he can qualify to get a freestyle libre monitor  Daughter also has concerns that he has been battling some depression for years and they specifically have concerns about how he would do following surgery.  Both wife and daughter have concerns for some cognitive deficits as well.   ROS: See pertinent positives and negatives per HPI.  Past Medical History:  Diagnosis Date  . CAD 07/09/2009   Cath 12-02-10 90% left main, 90%LAD, 99% circumflex, 80% RCA  . Cancer ([place])    Stomach and esophagus. 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 Cataract    basal cell CA- left ear  . COLONIC POLYPS, HX OF 07/09/2009   Qualifier: Diagnosis of  By: Valma Cava LPN, [name]    . DIAB W/O COMP TYPE II/UNS NOT STATED UNCNTRL 07/09/2009   Qualifier: Diagnosis of  By: Joyce Gross    . Family history of colon cancer   . Family history of pancreatic cancer   . GERD (gastroesophageal reflux disease)   . History of chicken pox   . HYPERLIPIDEMIA 07/09/2009   Qualifier: Diagnosis of  By: Valma Cava LPN, [name]    . HYPERTENSION 07/09/2009   Qualifier: Diagnosis of  By: Valma Cava LPN, [name]    . OBSTRUCTIVE SLEEP APNEA 07/09/2009   Qualifier: Diagnosis of  By: Joyce Gross    . Sleep apnea    no CPAP    Past Surgical History:  Procedure Laterality Date  . BIOPSY  07/22/2018   Procedure: BIOPSY;  Surgeon: Mansouraty, Telford Nab., MD;  Location: [facility];  Service: Endoscopy;;  . BIOPSY BREAST Left   .  CHOLECYSTECTOMY    . COLONOSCOPY    . CORONARY ANGIOPLASTY WITH STENT PLACEMENT    . CORONARY ARTERY BYPASS GRAFT  8/12   CABG X 4 LIMA-LAD, SVG-PDB, SVG-OM/RAMUS   /  . ESOPHAGOGASTRODUODENOSCOPY N/A 07/22/2018   Procedure: ESOPHAGOGASTRODUODENOSCOPY (EGD);  Surgeon: Rush Landmark Telford Nab., MD;  Location: [facility];  Service: Endoscopy;  Laterality: N/A;  . EUS N/A 07/22/2018   Procedure: UPPER ENDOSCOPIC ULTRASOUND (EUS) RADIAL;  Surgeon: Rush Landmark Telford Nab., MD;  Location: [facility];  Service:  Endoscopy;  Laterality: N/A;  . EUS  07/22/2018   Procedure: UPPER ENDOSCOPIC ULTRASOUND (EUS) LINEAR;  Surgeon: Rush Landmark Telford Nab., MD;  Location: [place];  Service: Endoscopy;;  . EYE SURGERY Bilateral    catarct  . FINE NEEDLE ASPIRATION  07/22/2018   Procedure: FINE NEEDLE ASPIRATION (FNA) LINEAR;  Surgeon: Irving Copas., MD;  Location: [facility];  Service: Endoscopy;;  . IR IMAGING GUIDED PORT INSERTION  05/15/2018  . TONSILLECTOMY      Family History  Problem Relation Age of Onset  . Hypertension Mother   . Heart disease Mother   . Stroke Mother   . Diabetes Mother   . Pancreatic cancer Mother   . Heart disease Father   . Hypertension Father   . Colon cancer Maternal Uncle        dx >50; d. 34  . Parkinson's disease Sister   . Other Maternal Aunt        infant death  . Other Paternal Aunt        infant death  . Heart disease Maternal Grandfather   . Heart disease Paternal Grandfather   . Esophageal cancer Neg Hx   . Stomach cancer Neg Hx   . Rectal cancer Neg Hx     SOCIAL HX: Non-smoker   Current Outpatient Medications:  .  amLODipine (NORVASC) 5 MG tablet, TAKE 1 TABLET DAILY (PLEASE SCHEDULE A PHYSICAL FOR MORE REFILLS) (Patient taking differently: Take 5 mg by mouth every evening. ), Disp: 90 tablet, Rfl: 1 .  Ascorbic Acid (VITAMIN C) 1000 MG tablet, Take 1,000 mg by mouth every evening. , Disp: , Rfl:  .  aspirin EC 81 MG tablet, Take 81 mg by mouth every evening., Disp: , Rfl:  .  b complex vitamins tablet, Take 1 tablet by mouth every evening. , Disp: , Rfl:  .  calcium carbonate (TUMS - DOSED IN MG ELEMENTAL CALCIUM) 500 MG chewable tablet, Chew 1-2 tablets by mouth 2 (two) times daily as needed (acid reflux/indigestion.). , Disp: , Rfl:  .  Cholecalciferol (VITAMIN D-3) 125 MCG (5000 UT) TABS, Take 5,000 Units by mouth daily., Disp: , Rfl:  .  dexamethasone (DECADRON) 4 MG tablet, Take 2 tablets (8 mg total) by mouth daily. Start the day  after chemotherapy for 2 days. Take with food. (Patient taking differently: Take 8 mg by mouth See admin instructions. Start the day after chemotherapy for 2 days. Take with food.), Disp: 30 tablet, Rfl: 1 .  ezetimibe (ZETIA) 10 MG tablet, TAKE 1 TABLET DAILY, Disp: 90 tablet, Rfl: 1 .  furosemide (LASIX) 20 MG tablet, TAKE 1 TABLET TWICE A DAY (Patient taking differently: Take 20 mg by mouth 2 (two) times daily. ), Disp: 180 tablet, Rfl: 3 .  [place] 300 MG CAPS, Take 300 mg by mouth 2 (two) times daily., Disp: , Rfl:  .  insulin lispro (HUMALOG) 100 UNIT/ML injection, Inject 0.15-0.18 mLs (15-18 Units total) into the skin 3 (  three) times daily before meals., Disp: 50 mL, Rfl: 2 .  Insulin Syringe-Needle U-100 (B-D INS SYR HALF-UNIT .3CC/31G) 31G X 5/16" 0.3 ML MISC, by Does not apply route., Disp: , Rfl:  .  Insulin Syringe-Needle U-100 31G X 5/16" 1 ML MISC, Use once daily, Disp: 100 each, Rfl: 3 .  LANTUS 100 UNIT/ML injection, INJECT 46 UNITS UNDER THE SKIN AT BEDTIME (Patient taking differently: Inject 46 Units into the skin at bedtime. ), Disp: 50 mL, Rfl: 5 .  lidocaine-prilocaine (EMLA) cream, Apply to affected area once (Patient taking differently: Apply 1 application topically daily as needed (prior to port being accessed.). ), Disp: 30 g, Rfl: 3 .  lisinopril (PRINIVIL,ZESTRIL) 20 MG tablet, Take 10 mg by mouth daily., Disp: , Rfl:  .  LORazepam (ATIVAN) 0.5 MG tablet, Take 1 tablet (0.5 mg total) by mouth every 6 (six) hours as needed (Nausea or vomiting)., Disp: 30 tablet, Rfl: 0 .  metFORMIN (GLUCOPHAGE) 1000 MG tablet, TAKE 1 TABLET TWICE A DAY WITH MEALS (Patient taking differently: Take 1,000 mg by mouth 2 (two) times daily. ), Disp: 180 tablet, Rfl: 4 .  metoprolol tartrate (LOPRESSOR) 50 MG tablet, TAKE ONE AND ONE-HALF TABLETS TWICE A DAY (SCHEDULE A PHYSICAL FOR MORE REFILLS) (Patient taking differently: Take 75 mg by mouth 2 (two) times daily. ), Disp: 270 tablet, Rfl: 4 .   Multiple Vitamin (MULTIVITAMIN WITH MINERALS) TABS tablet, Take 1 tablet by mouth every evening., Disp: , Rfl:  .  mupirocin ointment (BACTROBAN) 2 %, Place 1 application into the nose 2 (two) times daily. Apply to left second toe once daily (Patient taking differently: Apply 1 application topically once. Apply to left second toe once daily), Disp: 22 g, Rfl: 0 .  Omega-3 Fatty Acids (FISH OIL) 1200 MG CAPS, Take 1,200 mg by mouth 2 (two) times daily. , Disp: , Rfl:  .  omeprazole (PRILOSEC) 40 MG capsule, Take 1 capsule (40 mg total) by mouth 2 (two) times daily., Disp: 180 capsule, Rfl: 1 .  prochlorperazine (COMPAZINE) 10 MG tablet, Take 1 tablet (10 mg total) by mouth every 6 (six) hours as needed (Nausea or vomiting)., Disp: 30 tablet, Rfl: 1 .  rosuvastatin (CRESTOR) 20 MG tablet, TAKE 1 TABLET DAILY (PLEASE SCHEDULE A PHYSICAL FOR MORE REFILLS) (Patient taking differently: Take 20 mg by mouth every evening. ), Disp: 90 tablet, Rfl: 3 .  traMADol (ULTRAM) 50 MG tablet, Take 50 mg by mouth 3 (three) times daily as needed (pain). , Disp: , Rfl:  .  vitamin E (VITAMIN E) 400 UNIT capsule, Takes one tablet by mouth daily. (Patient taking differently: Take 400 Units by mouth daily. Takes one tablet by mouth daily.), Disp: , Rfl:   EXAM:  VITALS per patient if applicable:  GENERAL: alert, oriented, appears well and in no acute distress  HEENT: atraumatic, conjunttiva clear, no obvious abnormalities on inspection of external nose and ears  NECK: normal movements of the head and neck  LUNGS: on inspection no signs of respiratory distress, breathing rate appears normal, no obvious gross SOB, gasping or wheezing  CV: no obvious cyanosis  MS: moves all visible extremities without noticeable abnormality  PSYCH/NEURO: pleasant and cooperative, no obvious depression or anxiety, speech and thought processing grossly intact  ASSESSMENT AND PLAN:  Discussed the following assessment and plan:  #1  gastric adenocarcinoma with upcoming surgery June 2 for subtotal versus total gastrectomy  #2 type 2 diabetes.  Reported recent erratic eating habits and  somewhat labile blood sugars -They are very concerned about regulating his blood sugars especially postoperatively. -We suggested endocrinology consult to help guide his insulin therapy especially in view of upcoming surgery as above -We discussed possible continuous glucose monitoring system if he can get coverage  #3 history of CAD status post CABG     I discussed the assessment and treatment plan with the patient. The patient was provided an opportunity to ask questions and all were answered. The patient agreed with the plan and demonstrated an understanding of the instructions.   The patient was advised to call back or seek an in-person evaluation if the symptoms worsen or if the condition fails to improve as anticipated.     Carolann Littler, MD

## 2018-09-17 ENCOUNTER — Encounter: Payer: Self-pay | Admitting: Family Medicine

## 2018-09-17 ENCOUNTER — Other Ambulatory Visit: Payer: Self-pay

## 2018-09-17 MED ORDER — FREESTYLE LIBRE 14 DAY READER DEVI: 1.0000 | Freq: Every day | 1 refills | Status: DC

## 2018-09-17 MED ORDER — FREESTYLE LIBRE 14 DAY SENSOR MISC: 1.0000 | 3 refills | Status: DC

## 2018-09-24 NOTE — Pre-Procedure Instructions (Signed)
Tristan Hughes  09/24/2018      [facility] # [place], [place] [address] Phone: [phone] Fax: [phone]  [facility], [place] [address] [place] [address] Phone: [phone] Fax: [phone]    Your procedure is scheduled on October 01, 2018.  Report to [facility] Entrance "A" at 530 AM.  Call this number if you have problems the morning of surgery:  [phone]   Remember:  Do not eat after midnight.  You may drink clear liquids until 430 AM .  Clear liquids allowed are:                    Water, Juice (non-citric and without pulp), Carbonated beverages, Clear Tea, Black Coffee only and Gatorade  Please Finish your G2 (Gatorade) by 430 AM the morning of surgery.     Take these medicines the morning of surgery with A SIP OF WATER  dexamethasone (DECADRON)  metoprolol tartrate (LOPRESSOR) omeprazole (PRILOSEC)  LORazepam (ATIVAN)-if needed traMADol (ULTRAM)-if needed prochlorperazine (COMPAZINE) -if needed  Follow your surgeon's instructions on when to hold/resume aspirin.  If no instructions were given call the office to determine how they would like to you take aspirin.  7 days prior to surgery STOP taking any  Aleve, Naproxen, Ibuprofen, Motrin, Advil, Goody's, BC's, all herbal medications, fish oil, and all vitamins   WHAT DO I DO ABOUT MY DIABETES MEDICATION?   Marland Kitchen Do not take oral diabetes medicines (pills) the morning of surgery-metformin (glucophage).  . THE NIGHT BEFORE SURGERY, take 5-11 units of Humalog insulin with dinner (1/2 of your normal sliding scale dose) And NO LANTUS insulin.   . THE MORNING OF SURGERY, take NO Humalog insulin unless.your CBG is greater than 220 mg/dL, then  you may take  of your sliding scale (correction) dose of  Humalog Insulin.   . If your CBG is greater than 220 mg/dL, you may take  of your  sliding scale (correction) dose of insulin.  Reviewed and Endorsed by [facility] Patient Education Committee, August 2015   How to Manage Your Diabetes Before and After Surgery  Why is it important to control my blood sugar before and after surgery? . Improving blood sugar levels before and after surgery helps healing and can limit problems. . A way of improving blood sugar control is eating a healthy diet by: o  Eating less sugar and carbohydrates o  Increasing activity/exercise o  Talking with your doctor about reaching your blood sugar goals . High blood sugars (greater than 180 mg/dL) can raise your risk of infections and slow your recovery, so you will need to focus on controlling your diabetes during the weeks before surgery. . Make sure that the doctor who takes care of your diabetes knows about your planned surgery including the date and location.  How do I manage my blood sugar before surgery? . Check your blood sugar at least 4 times a day, starting 2 days before surgery, to make sure that the level is not too high or low. o Check your blood sugar the morning of your surgery when you wake up and every 2 hours until you get to the Short Stay unit. . If your blood sugar is less than 70 mg/dL, you will need to treat for low blood sugar: o Do not take insulin. o Treat a  low blood sugar (less than 70 mg/dL) with  cup of clear juice (cranberry or apple), 4 glucose tablets, OR glucose gel. Recheck blood sugar in 15 minutes after treatment (to make sure it is greater than 70 mg/dL). If your blood sugar is not greater than 70 mg/dL on recheck, call [phone] o  for further instructions. . Report your blood sugar to the short stay nurse when you get to Short Stay.  . If you are admitted to the hospital after surgery: o Your blood sugar will be checked by the staff and you will probably be given insulin after surgery (instead of oral diabetes medicines) to make sure you have good  blood sugar levels. o The goal for blood sugar control after surgery is 80-180 mg/dL.  [facility]- Preparing For Surgery  Before surgery, you can play an important role. Because skin is not sterile, your skin needs to be as free of germs as possible. You can reduce the number of germs on your skin by washing with CHG (chlorahexidine gluconate) Soap before surgery.  CHG is an antiseptic cleaner which kills germs and bonds with the skin to continue killing germs even after washing.    Oral Hygiene is also important to reduce your risk of infection.  Remember - BRUSH YOUR TEETH THE MORNING OF SURGERY WITH YOUR REGULAR TOOTHPASTE  Please do not use if you have an allergy to CHG or antibacterial soaps. If your skin becomes reddened/irritated stop using the CHG.  Do not shave (including legs and underarms) for at least 48 hours prior to first CHG shower. It is OK to shave your face.  Please follow these instructions carefully.   1. Shower the NIGHT BEFORE SURGERY and the MORNING OF SURGERY with CHG.   2. If you chose to wash your hair, wash your hair first as usual with your normal shampoo.  3. After you shampoo, rinse your hair and body thoroughly to remove the shampoo.  4. Use CHG as you would any other liquid soap. You can apply CHG directly to the skin and wash gently with a scrungie or a clean washcloth.   5. Apply the CHG Soap to your body ONLY FROM THE NECK DOWN.  Do not use on open wounds or open sores. Avoid contact with your eyes, ears, mouth and genitals (private parts). Wash Face and genitals (private parts)  with your normal soap.  6. Wash thoroughly, paying special attention to the area where your surgery will be performed.  7. Thoroughly rinse your body with warm water from the neck down.  8. DO NOT shower/wash with your normal soap after using and rinsing off the CHG Soap.  9. Pat yourself dry with a CLEAN TOWEL.  10. Wear CLEAN PAJAMAS to bed the night before surgery, wear  comfortable clothes the morning of surgery  11. Place CLEAN SHEETS on your bed the night of your first shower and DO NOT SLEEP WITH PETS.  Day of Surgery:  Do not apply any deodorants/lotions.  Please wear clean clothes to the hospital/surgery center.   Remember to brush your teeth WITH YOUR REGULAR TOOTHPASTE.    Do not wear jewelry  Do not wear lotions, powders, or colognes, or deodorant.  Men may shave face and neck.  Do not bring valuables to the hospital.  [facility] is not responsible for any belongings or valuables.  Contacts, dentures or bridgework may not be worn into surgery.  Leave your suitcase in the car.  After surgery it  may be brought to your room.  For patients admitted to the hospital, discharge time will be determined by your treatment team.  Patients discharged the day of surgery will not be allowed to drive home.   Please read over the following fact sheets that you were given.

## 2018-09-25 ENCOUNTER — Encounter (HOSPITAL_COMMUNITY)
Admission: RE | Admit: 2018-09-25 | Discharge: 2018-09-25 | Disposition: A | Discharge status: Home / Self Care | Payer: Medicare Other | Source: Ambulatory Visit | Attending: General Surgery | Admitting: General Surgery

## 2018-09-25 ENCOUNTER — Other Ambulatory Visit: Payer: Self-pay

## 2018-09-25 ENCOUNTER — Encounter (HOSPITAL_COMMUNITY): Payer: Self-pay

## 2018-09-25 ENCOUNTER — Telehealth: Payer: Self-pay | Admitting: *Deleted

## 2018-09-25 DIAGNOSIS — Z01810 Encounter for preprocedural cardiovascular examination: Secondary | ICD-10-CM | POA: Diagnosis present

## 2018-09-25 DIAGNOSIS — Z1159 Encounter for screening for other viral diseases: Secondary | ICD-10-CM | POA: Diagnosis not present

## 2018-09-25 DIAGNOSIS — I25810 Atherosclerosis of coronary artery bypass graft(s) without angina pectoris: Secondary | ICD-10-CM

## 2018-09-25 DIAGNOSIS — Z01812 Encounter for preprocedural laboratory examination: Secondary | ICD-10-CM | POA: Insufficient documentation

## 2018-09-25 HISTORY — DX: Depression, unspecified: F32.A

## 2018-09-25 HISTORY — DX: Anxiety disorder, unspecified: F41.9

## 2018-09-25 LAB — COMPREHENSIVE METABOLIC PANEL
ALT: 21 U/L (ref 0–44)
AST: 24 U/L (ref 15–41)
Albumin: 3.9 g/dL (ref 3.5–5.0)
Alkaline Phosphatase: 57 U/L (ref 38–126)
Anion gap: 11 (ref 5–15)
BUN: 23 mg/dL (ref 8–23)
CO2: 27 mmol/L (ref 22–32)
Calcium: 9.3 mg/dL (ref 8.9–10.3)
Chloride: 101 mmol/L (ref 98–111)
Creatinine, Ser: 1.11 mg/dL (ref 0.61–1.24)
GFR calc Af Amer: >60 mL/min (ref >60)
GFR calc non Af Amer: >60 mL/min (ref >60)
Glucose, Bld: 164 mg/dL — ABNORMAL HIGH (ref 70–99)
Potassium: 4.6 mmol/L (ref 3.5–5.1)
Sodium: 139 mmol/L (ref 135–145)
Total Bilirubin: 0.9 mg/dL (ref 0.3–1.2)
Total Protein: 6.6 g/dL (ref 6.5–8.1)

## 2018-09-25 LAB — SURGICAL PCR SCREEN
MRSA, PCR: NEGATIVE
Staphylococcus aureus: NEGATIVE

## 2018-09-25 LAB — CBC WITH DIFFERENTIAL/PLATELET
Abs Immature Granulocytes: 0.03 10*3/uL (ref 0.00–0.07)
Basophils Absolute: 0 10*3/uL (ref 0.0–0.1)
Basophils Relative: 0 %
Eosinophils Absolute: 0.1 10*3/uL (ref 0.0–0.5)
Eosinophils Relative: 2 %
HCT: 45.9 % (ref 39.0–52.0)
Hemoglobin: 14.8 g/dL (ref 13.0–17.0)
Immature Granulocytes: 0 %
Lymphocytes Relative: 14 %
Lymphs Abs: 1 10*3/uL (ref 0.7–4.0)
MCH: 30 pg (ref 26.0–34.0)
MCHC: 32.2 g/dL (ref 30.0–36.0)
MCV: 92.9 fL (ref 80.0–100.0)
Monocytes Absolute: 0.8 10*3/uL (ref 0.1–1.0)
Monocytes Relative: 11 %
Neutro Abs: 5.3 10*3/uL (ref 1.7–7.7)
Neutrophils Relative %: 73 %
Platelets: 191 10*3/uL (ref 150–400)
RBC: 4.94 MIL/uL (ref 4.22–5.81)
RDW: 12.3 % (ref 11.5–15.5)
WBC: 7.2 10*3/uL (ref 4.0–10.5)
nRBC: 0 % (ref 0.0–0.2)

## 2018-09-25 LAB — GLUCOSE, CAPILLARY: Glucose-Capillary: 89 mg/dL (ref 70–99)

## 2018-09-25 LAB — HEMOGLOBIN A1C
Hgb A1c MFr Bld: 5.8 % — ABNORMAL HIGH (ref 4.8–5.6)
Mean Plasma Glucose: 119.76 mg/dL

## 2018-09-25 NOTE — Progress Notes (Addendum)
Called Dr Marlowe Aschoff office and spoke with Mardene Celeste, RN to verify the ERAS orders for surgery.  Dr. Barry Dienes is not available.  Discussed the ERAS orders with Mardene Celeste in Epic  1.  5 days prior to surgery (09/25/18) drink 257ml twice daily between meals Ensure Pre-Surgery drink for 10 doses. 2. Drink 517ml the night prior to surgery (09/30/18) by 1800 Ensure Pre-Surgery drink. 3. Drink 294ml on DOS (10/01/18) Ensure Pre-Surgery drink by 4 am.  Per Mardene Celeste, Dr Barry Dienes thought we [facility]) would be providing these drinks.  Informed Mardene Celeste that we do not provide these drinks, normally they would be provided by the office.  Mardene Celeste sent message to Dr Barry Dienes to clarify ERAS orders and stated that a return a call to me with clarification.  I advised Mardene Celeste that I would give patient the G2 low sugar ERAS drink at PAT appointment  because patient is a diabetic.  Our Anesthesia protocol was for drink to be in by 4:30am on DOS and the G2 low sugar drinks are given to all diabetic patients.  Mardene Celeste verbalized understanding.  Awaiting follow up from MD/Office.  I instructed patient that if he didn't hear back from me, the only ERAS drink he had to drink was the one I provided him at the PAT appointment.  Patient and wife (on patient's cell phone speaker during PAT appt.) verbalized understanding.

## 2018-09-25 NOTE — Progress Notes (Signed)
Patient informed of the [place] that is now in effect.  Verbalizes understanding.    PCP - Dr Sheldon Silvan Cardiologist - Dr Tilley/Dr Munley(Cardiac Clearance on chart)  Chest x-ray - 12/10/2016 EKG - 04/11/18 Stress Test - ? 2011 per patient/wife(on pt's cell speaker phone) but does not know locationor name of MD. ECHO - 09/25/18 Cardiac Cath - yes  Sleep Study - yes CPAP - Does not use CPAP  Fasting Blood Sugar - 80-120 Checks Blood Sugar ____3_ times a day  Aspirin Instructions: Patient to contact MD for instructions on when to hold/resume aspirin prior to surgery.  Anesthesia review: Yes Consult with Jeneen Rinks, PA after PAT appt.  Coronavirus Screening Have you or your wife Vaughan Basta experienced the following symptoms:  Cough yes/no: No Fever (>100.80F)  yes/no: No Runny nose yes/no: No Sore throat yes/no: No Difficulty breathing/shortness of breath  yes/no: No  Have you or a family member traveled in the last 14 days and where? yes/no: No  Patient verbalized understanding of instructions that were given to them at the PAT appointment. Patient was also instructed that they will need to review over the PAT instructions again at home before surgery.

## 2018-09-25 NOTE — Telephone Encounter (Signed)
Yes Class 1 prop CAD at [facility]

## 2018-09-25 NOTE — Telephone Encounter (Signed)
Pt had a cabg in 2012. Anisthesiologist would like echo before surgery. Can we order?

## 2018-09-25 NOTE — Telephone Encounter (Signed)
Called pt to let him know he is scheduled for Echo at [facility] on 5/28 at 11:00 am. Pt was agreeable to this and thanked me.

## 2018-09-25 NOTE — Progress Notes (Signed)
Caryn Section, RN at Dr Marlowe Aschoff office.  I have not received clarification on ERAS orders.  Dr Barry Dienes not available and will return a call with clarification when available.

## 2018-09-25 NOTE — Progress Notes (Signed)
Anesthesia Chart Review:  Case:  [id number] Date/Time:  10/01/18 0715   Procedures:      LAPAROSCOPY DIAGNOSTIC (N/A ) - AND EPIDURAL ANESTHESIA     SUBTOTAL VS TOTAL GASTRECTOMY, FEEDING TUBE PLACEMENT (N/A ) - AND EPIDURAL ANESTHESIA     POSSIBLE DISTAL PANCREATECTOMY (N/A ) - AND EPIDURAL ANESTHESIA     Possible Right THORACOTOMY MAJOR (Right Chest)   Anesthesia type:  General   Pre-op diagnosis:  gastroesophageal junction carcinoma   [facility]  [facility] ROOM 02 / [facility] OR   Surgeon:  Stark Klein, MD; Grace Isaac, MD      DISCUSSION: 78 yo male never smoker. Pertinent hx includes hypertension, CAD s/p CABG 2012, OSA, GERD, gastric adenocarcinoma, IDDMII, hyperlipidemia.  Pt follows with Dr. Bettina Gavia for hx of CAD s/p CABG in 2012 while living in [place]. He has not had any recent cardiac testing. He was cleared for surgery 06/25/18 in Epic message stating "I reviewed his office note he is chronic stable CAD asymptomatic New York Heart Association class I and I think he is a good candidate for the planned surgical procedure.  At this time I would not do any further preoperative testing.  If it would make you or the patient feel comfortable I be happy to see him back in the office as is been 2 months since his last evaluation or proceed with his planned surgery."  Dr. Barry Dienes also requested anesthesia consult. I interviewed the pt and he denied any CV symptoms, says he is able to walk up a flight of stairs without angina or dyspnea. He has no CV complaints. We also discussed use of epidural anesthesia and he had no further questions. I discussed the pt with Dr. Kalman Shan due to no recent CV testing. He recommended pt have echo prior to surgery. Dr. Joya Gaskins office notified and will arrange..   Echo done 09/26/18, Dr. Bettina Gavia commented on result stating "Normal or stable result. Mild AR and mildly dilated aorta, I see no issues for anesthesia and forwarded to them."  Anticipate he can proceed as planned  barring acute status change.   VS: BP 119/63   Pulse 60   Temp 36.7 C   Resp 18   Ht 5\' 9"  (1.753 m)   Wt 94.8 kg   SpO2 99%   BMI 30.85 kg/m   PROVIDERS: Eulas Post, MD is PCP  Shirlee More, MD is Cardiologist  [name], is Oncologist  LABS: Labs reviewed: Acceptable for surgery. (all labs ordered are listed, but only abnormal results are displayed)  Labs Reviewed  COMPREHENSIVE METABOLIC PANEL - Abnormal; Notable for the following components:      Result Value   Glucose, Bld 164 (*)    All other components within normal limits  HEMOGLOBIN A1C - Abnormal; Notable for the following components:   Hgb A1c MFr Bld 5.8 (*)    All other components within normal limits  GLUCOSE, CAPILLARY  CBC WITH DIFFERENTIAL/PLATELET  TYPE AND SCREEN  ABO/RH     IMAGES: CT Chest/Abd 07/29/2018: IMPRESSION: Very mild wall thickening at the GE junction. Prior mass at the gastric cardia has essentially resolved.  No evidence of metastatic disease.  Sequela of prior granulomatous disease, as above.   EKG: 04/11/2018: Sinus bradycardia. Rate 59.   CV: TTE 09/26/18: 1. The left ventricle has normal systolic function with an ejection fraction of 60-65%. The cavity size was normal. Left ventricular diastolic Doppler parameters are consistent with impaired relaxation.  2.  The right ventricle has normal systolic function. The cavity was normal. There is no increase in right ventricular wall thickness.  3. No evidence of mitral valve stenosis.  4. The aortic valve is tricuspid. Mild thickening of the aortic valve. Aortic valve regurgitation is mild by color flow Doppler. No stenosis of the aortic valve.  5. 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   Past Medical History:  Diagnosis Date  . Anxiety   . CAD 07/09/2009   Cath 12-02-10 90% left main, 90%LAD, 99% circumflex, 80% RCA  . Cancer ([place])    Stomach and esophagus. 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 Cataract    basal cell CA- left ear  . COLONIC POLYPS, HX OF 07/09/2009   Qualifier: Diagnosis of  By: Valma Cava LPN, [name]    . Depression   . DIAB W/O COMP TYPE II/UNS NOT STATED UNCNTRL 07/09/2009   Qualifier: Diagnosis of  By: Joyce Gross    . Family history of colon cancer   . Family history of pancreatic cancer   . GERD (gastroesophageal reflux disease)   . History of chicken pox   . HYPERLIPIDEMIA 07/09/2009   Qualifier: Diagnosis of  By: Valma Cava LPN, [name]    . HYPERTENSION 07/09/2009   Qualifier: Diagnosis of  By: Valma Cava LPN, [name]    . OBSTRUCTIVE SLEEP APNEA 07/09/2009   Qualifier: Diagnosis of  By: Joyce Gross    . Sleep apnea    does not uses CPAP    Past Surgical History:  Procedure Laterality Date  . BIOPSY  07/22/2018   Procedure: BIOPSY;  Surgeon: Mansouraty, Telford Nab., MD;  Location: [place];  Service: Endoscopy;;  . BIOPSY BREAST Left   . CHOLECYSTECTOMY    . COLONOSCOPY    . CORONARY ANGIOPLASTY WITH STENT PLACEMENT    . CORONARY ARTERY BYPASS GRAFT  8/12   CABG X 4 LIMA-LAD, SVG-PDB, SVG-OM/RAMUS   /  . ESOPHAGOGASTRODUODENOSCOPY N/A 07/22/2018   Procedure: ESOPHAGOGASTRODUODENOSCOPY (EGD);  Surgeon: Rush Landmark Telford Nab., MD;  Location: [place];  Service: Endoscopy;  Laterality: N/A;  . EUS N/A 07/22/2018   Procedure: UPPER ENDOSCOPIC ULTRASOUND (EUS) RADIAL;  Surgeon: Rush Landmark Telford Nab., MD;  Location: [place];  Service: Endoscopy;  Laterality: N/A;  . EUS  07/22/2018   Procedure: UPPER ENDOSCOPIC ULTRASOUND (EUS) LINEAR;  Surgeon: Rush Landmark Telford Nab., MD;  Location: [place];  Service: Endoscopy;;  . EYE SURGERY Bilateral    catarct  . FINE NEEDLE ASPIRATION  07/22/2018   Procedure: FINE NEEDLE ASPIRATION (FNA) LINEAR;  Surgeon: Irving Copas., MD;  Location: [place];  Service: Endoscopy;;  . IR IMAGING GUIDED PORT INSERTION  05/15/2018  . TONSILLECTOMY      MEDICATIONS: . amLODipine (NORVASC) 5 MG  tablet  . Ascorbic Acid (VITAMIN C) 1000 MG tablet  . aspirin EC 81 MG tablet  . b complex vitamins tablet  . calcium carbonate (TUMS - DOSED IN MG ELEMENTAL CALCIUM) 500 MG chewable tablet  . Cholecalciferol (VITAMIN D-3) 125 MCG (5000 UT) TABS  . Continuous Blood Gluc Receiver (FREESTYLE LIBRE 14 DAY READER) DEVI  . Continuous Blood Gluc Sensor (FREESTYLE LIBRE 14 DAY SENSOR) MISC  . dexamethasone (DECADRON) 4 MG tablet  . ezetimibe (ZETIA) 10 MG tablet  . furosemide (LASIX) 20 MG tablet  . Horse Chestnut 300 MG CAPS  . insulin lispro (HUMALOG) 100 UNIT/ML injection  . Insulin Syringe-Needle U-100 (B-D INS SYR HALF-UNIT .3CC/31G) 31G X 5/16" 0.3  ML MISC  . Insulin Syringe-Needle U-100 31G X 5/16" 1 ML MISC  . LANTUS 100 UNIT/ML injection  . lidocaine-prilocaine (EMLA) cream  . lisinopril (PRINIVIL,ZESTRIL) 20 MG tablet  . LORazepam (ATIVAN) 0.5 MG tablet  . metFORMIN (GLUCOPHAGE) 1000 MG tablet  . metoprolol tartrate (LOPRESSOR) 50 MG tablet  . Multiple Vitamin (MULTIVITAMIN WITH MINERALS) TABS tablet  . mupirocin ointment (BACTROBAN) 2 %  . Omega-3 Fatty Acids (FISH OIL) 1200 MG CAPS  . omeprazole (PRILOSEC) 40 MG capsule  . prochlorperazine (COMPAZINE) 10 MG tablet  . rosuvastatin (CRESTOR) 20 MG tablet  . traMADol (ULTRAM) 50 MG tablet  . vitamin E (VITAMIN E) 400 UNIT capsule   No current facility-administered medications for this encounter.     Wynonia Musty [facility] Short Stay Center/Anesthesiology Phone [phone] 09/30/2018 8:25 AM

## 2018-09-26 ENCOUNTER — Ambulatory Visit (HOSPITAL_COMMUNITY)
Admission: RE | Admit: 2018-09-26 | Discharge: 2018-09-26 | Disposition: A | Discharge status: Home / Self Care | Payer: Medicare Other | Source: Ambulatory Visit | Attending: Cardiology | Admitting: Cardiology

## 2018-09-26 DIAGNOSIS — Z1159 Encounter for screening for other viral diseases: Secondary | ICD-10-CM | POA: Insufficient documentation

## 2018-09-26 DIAGNOSIS — Z01810 Encounter for preprocedural cardiovascular examination: Secondary | ICD-10-CM | POA: Insufficient documentation

## 2018-09-26 DIAGNOSIS — I25810 Atherosclerosis of coronary artery bypass graft(s) without angina pectoris: Secondary | ICD-10-CM

## 2018-09-26 LAB — HM DIABETES EYE EXAM

## 2018-09-26 LAB — ABO/RH: ABO/RH(D): AB POS

## 2018-09-26 NOTE — Progress Notes (Signed)
  Echocardiogram 2D Echocardiogram has been performed.  Tristan Hughes 09/26/2018, 11:52 AM

## 2018-09-26 NOTE — Progress Notes (Signed)
Spoke with Mardene Celeste, RN at Dr. Marlowe Aschoff office yesterday requesting clarification on ERAS orders.  Mardene Celeste stated Dr. Barry Dienes will call [phone]) with clarification. No call received today.  PAT nurse will continue to follow up.  DOS 10/01/18.

## 2018-09-27 ENCOUNTER — Other Ambulatory Visit (HOSPITAL_COMMUNITY)
Admission: RE | Admit: 2018-09-27 | Discharge: 2018-09-27 | Disposition: A | Discharge status: Home / Self Care | Payer: Medicare Other | Source: Ambulatory Visit | Attending: General Surgery | Admitting: General Surgery

## 2018-09-27 DIAGNOSIS — Z01810 Encounter for preprocedural cardiovascular examination: Secondary | ICD-10-CM | POA: Diagnosis not present

## 2018-09-27 NOTE — Progress Notes (Signed)
Dr. Barry Dienes advised that pt be given 2 bottles of pre-surgery carbohydrate drink to consume ( 1 early in the day and the other at 6:00 PM) the day prior to surgery. Pt stated that the PAT nurse provided the G2 drink for the morning of the procedure. Drinks left at the front desk for pt. Pt verbalized understanding of pre-op instructions.

## 2018-09-27 NOTE — Progress Notes (Signed)
Left voice message with Cipriano Mile, Operations Manager, ( to f/u as requested by previous nurse)  to clarify ERAS order; awaiting a return call.

## 2018-09-28 LAB — NOVEL CORONAVIRUS, NAA (HOSP ORDER, SEND-OUT TO REF LAB; TAT 18-24 HRS): SARS-CoV-2, NAA: NOT DETECTED

## 2018-09-30 NOTE — Anesthesia Preprocedure Evaluation (Addendum)
Anesthesia Evaluation  Patient identified by MRN, date of birth, ID band Patient awake    Reviewed: Allergy & Precautions, NPO status , Patient's Chart, lab work & pertinent test results  History of Anesthesia Complications Negative for: history of anesthetic complications  Airway Mallampati: II  TM Distance: >3 FB Neck ROM: Full    Dental no notable dental hx. (+) Teeth Intact   Pulmonary sleep apnea ,    Pulmonary exam normal        Cardiovascular hypertension, + CAD, + Past MI and + CABG  Normal cardiovascular exam     Neuro/Psych PSYCHIATRIC DISORDERS Anxiety Depression negative neurological ROS     GI/Hepatic Neg liver ROS, GERD  ,Gastric adenocarcinoma   Endo/Other  diabetes, Type 2, Insulin Dependent, Oral Hypoglycemic Agents  Renal/GU negative Renal ROS  negative genitourinary   Musculoskeletal negative musculoskeletal ROS (+)   Abdominal   Peds  Hematology negative hematology ROS (+)   Anesthesia Other Findings 78 yo male never smoker. Pertinent hx includes hypertension, CAD s/p CABG 2012, OSA, GERD, gastric adenocarcinoma, IDDMII, hyperlipidemia.  Reproductive/Obstetrics                           Anesthesia Physical Anesthesia Plan  ASA: III  Anesthesia Plan: General and Epidural   Post-op Pain Management: GA combined w/ Regional for post-op pain   Induction: Intravenous and Rapid sequence  PONV Risk Score and Plan: 2 and Ondansetron, Dexamethasone, Midazolam and Treatment may vary due to age or medical condition  Airway Management Planned: Oral ETT  Additional Equipment: Arterial line  Intra-op Plan:   Post-operative Plan: Extubation in OR and Possible Post-op intubation/ventilation  Informed Consent: I have reviewed the patients History and Physical, chart, labs and discussed the procedure including the risks, benefits and alternatives for the proposed anesthesia  with the patient or authorized representative who has indicated his/her understanding and acceptance.     Dental advisory given  Plan Discussed with:   Anesthesia Plan Comments: (Large bore PIV x2 Arterial line Epidural)      Anesthesia Quick Evaluation

## 2018-10-01 ENCOUNTER — Inpatient Hospital Stay (HOSPITAL_COMMUNITY)
Admission: AD | Admit: 2018-10-01 | Discharge: 2018-10-26 | DRG: 326 | Disposition: A | Discharge status: Hospice | Payer: Medicare Other | Attending: General Surgery | Admitting: General Surgery

## 2018-10-01 ENCOUNTER — Encounter (HOSPITAL_COMMUNITY): Payer: Self-pay | Admitting: *Deleted

## 2018-10-01 ENCOUNTER — Inpatient Hospital Stay (HOSPITAL_COMMUNITY): Payer: Medicare Other | Admitting: Physician Assistant

## 2018-10-01 ENCOUNTER — Encounter (HOSPITAL_COMMUNITY)
Admission: AD | Disposition: A | Discharge status: Hospice | Payer: Self-pay | Source: Home / Self Care | Attending: General Surgery

## 2018-10-01 ENCOUNTER — Other Ambulatory Visit: Payer: Self-pay

## 2018-10-01 ENCOUNTER — Inpatient Hospital Stay (HOSPITAL_COMMUNITY): Payer: Medicare Other | Admitting: Certified Registered Nurse Anesthetist

## 2018-10-01 DIAGNOSIS — R6521 Severe sepsis with septic shock: Secondary | ICD-10-CM | POA: Diagnosis not present

## 2018-10-01 DIAGNOSIS — D6489 Other specified anemias: Secondary | ICD-10-CM | POA: Diagnosis not present

## 2018-10-01 DIAGNOSIS — K3189 Other diseases of stomach and duodenum: Secondary | ICD-10-CM | POA: Diagnosis not present

## 2018-10-01 DIAGNOSIS — Z85828 Personal history of other malignant neoplasm of skin: Secondary | ICD-10-CM

## 2018-10-01 DIAGNOSIS — R0902 Hypoxemia: Secondary | ICD-10-CM

## 2018-10-01 DIAGNOSIS — R509 Fever, unspecified: Secondary | ICD-10-CM

## 2018-10-01 DIAGNOSIS — Z86010 Personal history of colonic polyps: Secondary | ICD-10-CM

## 2018-10-01 DIAGNOSIS — C160 Malignant neoplasm of cardia: Secondary | ICD-10-CM | POA: Diagnosis present

## 2018-10-01 DIAGNOSIS — R188 Other ascites: Secondary | ICD-10-CM

## 2018-10-01 DIAGNOSIS — I110 Hypertensive heart disease with heart failure: Secondary | ICD-10-CM | POA: Diagnosis not present

## 2018-10-01 DIAGNOSIS — J80 Acute respiratory distress syndrome: Secondary | ICD-10-CM | POA: Diagnosis not present

## 2018-10-01 DIAGNOSIS — Z833 Family history of diabetes mellitus: Secondary | ICD-10-CM

## 2018-10-01 DIAGNOSIS — I509 Heart failure, unspecified: Secondary | ICD-10-CM | POA: Diagnosis not present

## 2018-10-01 DIAGNOSIS — Z66 Do not resuscitate: Secondary | ICD-10-CM | POA: Diagnosis not present

## 2018-10-01 DIAGNOSIS — E785 Hyperlipidemia, unspecified: Secondary | ICD-10-CM | POA: Diagnosis present

## 2018-10-01 DIAGNOSIS — Z9089 Acquired absence of other organs: Secondary | ICD-10-CM

## 2018-10-01 DIAGNOSIS — E876 Hypokalemia: Secondary | ICD-10-CM | POA: Diagnosis not present

## 2018-10-01 DIAGNOSIS — Z515 Encounter for palliative care: Secondary | ICD-10-CM | POA: Diagnosis not present

## 2018-10-01 DIAGNOSIS — D7389 Other diseases of spleen: Secondary | ICD-10-CM | POA: Diagnosis present

## 2018-10-01 DIAGNOSIS — Z452 Encounter for adjustment and management of vascular access device: Secondary | ICD-10-CM

## 2018-10-01 DIAGNOSIS — G9341 Metabolic encephalopathy: Secondary | ICD-10-CM | POA: Diagnosis not present

## 2018-10-01 DIAGNOSIS — G4733 Obstructive sleep apnea (adult) (pediatric): Secondary | ICD-10-CM | POA: Diagnosis present

## 2018-10-01 DIAGNOSIS — K219 Gastro-esophageal reflux disease without esophagitis: Secondary | ICD-10-CM | POA: Diagnosis present

## 2018-10-01 DIAGNOSIS — E872 Acidosis: Secondary | ICD-10-CM | POA: Diagnosis not present

## 2018-10-01 DIAGNOSIS — D473 Essential (hemorrhagic) thrombocythemia: Secondary | ICD-10-CM | POA: Diagnosis not present

## 2018-10-01 DIAGNOSIS — R578 Other shock: Secondary | ICD-10-CM | POA: Diagnosis not present

## 2018-10-01 DIAGNOSIS — J690 Pneumonitis due to inhalation of food and vomit: Secondary | ICD-10-CM | POA: Diagnosis not present

## 2018-10-01 DIAGNOSIS — A4151 Sepsis due to Escherichia coli [E. coli]: Secondary | ICD-10-CM | POA: Diagnosis not present

## 2018-10-01 DIAGNOSIS — J9601 Acute respiratory failure with hypoxia: Secondary | ICD-10-CM | POA: Diagnosis not present

## 2018-10-01 DIAGNOSIS — Z9289 Personal history of other medical treatment: Secondary | ICD-10-CM

## 2018-10-01 DIAGNOSIS — J9690 Respiratory failure, unspecified, unspecified whether with hypoxia or hypercapnia: Secondary | ICD-10-CM

## 2018-10-01 DIAGNOSIS — E870 Hyperosmolality and hypernatremia: Secondary | ICD-10-CM | POA: Diagnosis not present

## 2018-10-01 DIAGNOSIS — Z8249 Family history of ischemic heart disease and other diseases of the circulatory system: Secondary | ICD-10-CM

## 2018-10-01 DIAGNOSIS — F419 Anxiety disorder, unspecified: Secondary | ICD-10-CM | POA: Diagnosis present

## 2018-10-01 DIAGNOSIS — E669 Obesity, unspecified: Secondary | ICD-10-CM | POA: Diagnosis present

## 2018-10-01 DIAGNOSIS — I469 Cardiac arrest, cause unspecified: Secondary | ICD-10-CM | POA: Diagnosis not present

## 2018-10-01 DIAGNOSIS — D638 Anemia in other chronic diseases classified elsewhere: Secondary | ICD-10-CM | POA: Diagnosis present

## 2018-10-01 DIAGNOSIS — K658 Other peritonitis: Secondary | ICD-10-CM | POA: Diagnosis not present

## 2018-10-01 DIAGNOSIS — Z955 Presence of coronary angioplasty implant and graft: Secondary | ICD-10-CM

## 2018-10-01 DIAGNOSIS — Z8279 Family history of other congenital malformations, deformations and chromosomal abnormalities: Secondary | ICD-10-CM

## 2018-10-01 DIAGNOSIS — D62 Acute posthemorrhagic anemia: Secondary | ICD-10-CM | POA: Diagnosis not present

## 2018-10-01 DIAGNOSIS — I959 Hypotension, unspecified: Secondary | ICD-10-CM | POA: Diagnosis not present

## 2018-10-01 DIAGNOSIS — I4891 Unspecified atrial fibrillation: Secondary | ICD-10-CM | POA: Diagnosis not present

## 2018-10-01 DIAGNOSIS — R64 Cachexia: Secondary | ICD-10-CM | POA: Diagnosis present

## 2018-10-01 DIAGNOSIS — G931 Anoxic brain damage, not elsewhere classified: Secondary | ICD-10-CM | POA: Diagnosis not present

## 2018-10-01 DIAGNOSIS — Z6832 Body mass index (BMI) 32.0-32.9, adult: Secondary | ICD-10-CM

## 2018-10-01 DIAGNOSIS — E440 Moderate protein-calorie malnutrition: Secondary | ICD-10-CM | POA: Diagnosis present

## 2018-10-01 DIAGNOSIS — Z79899 Other long term (current) drug therapy: Secondary | ICD-10-CM

## 2018-10-01 DIAGNOSIS — Z9221 Personal history of antineoplastic chemotherapy: Secondary | ICD-10-CM

## 2018-10-01 DIAGNOSIS — K660 Peritoneal adhesions (postprocedural) (postinfection): Secondary | ICD-10-CM | POA: Diagnosis present

## 2018-10-01 DIAGNOSIS — A419 Sepsis, unspecified organism: Secondary | ICD-10-CM | POA: Diagnosis not present

## 2018-10-01 DIAGNOSIS — N179 Acute kidney failure, unspecified: Secondary | ICD-10-CM | POA: Diagnosis not present

## 2018-10-01 DIAGNOSIS — K5710 Diverticulosis of small intestine without perforation or abscess without bleeding: Secondary | ICD-10-CM | POA: Diagnosis present

## 2018-10-01 DIAGNOSIS — K229 Disease of esophagus, unspecified: Secondary | ICD-10-CM

## 2018-10-01 DIAGNOSIS — F329 Major depressive disorder, single episode, unspecified: Secondary | ICD-10-CM | POA: Diagnosis present

## 2018-10-01 DIAGNOSIS — Z800 Family history of malignant neoplasm of digestive organs: Secondary | ICD-10-CM

## 2018-10-01 DIAGNOSIS — I2510 Atherosclerotic heart disease of native coronary artery without angina pectoris: Secondary | ICD-10-CM | POA: Diagnosis present

## 2018-10-01 DIAGNOSIS — C169 Malignant neoplasm of stomach, unspecified: Secondary | ICD-10-CM

## 2018-10-01 DIAGNOSIS — Z419 Encounter for procedure for purposes other than remedying health state, unspecified: Secondary | ICD-10-CM

## 2018-10-01 DIAGNOSIS — D3A8 Other benign neuroendocrine tumors: Secondary | ICD-10-CM | POA: Diagnosis present

## 2018-10-01 DIAGNOSIS — Z823 Family history of stroke: Secondary | ICD-10-CM

## 2018-10-01 DIAGNOSIS — Z886 Allergy status to analgesic agent status: Secondary | ICD-10-CM

## 2018-10-01 DIAGNOSIS — Z951 Presence of aortocoronary bypass graft: Secondary | ICD-10-CM

## 2018-10-01 DIAGNOSIS — E1165 Type 2 diabetes mellitus with hyperglycemia: Secondary | ICD-10-CM | POA: Diagnosis present

## 2018-10-01 HISTORY — PX: PYLOROPLASTY: SHX418

## 2018-10-01 HISTORY — PX: OPEN PROXIMAL GASTRECTOMY: SHX5988

## 2018-10-01 HISTORY — PX: JEJUNOSTOMY: SHX313

## 2018-10-01 HISTORY — PX: LAPAROSCOPY: SHX197

## 2018-10-01 HISTORY — PX: OPERATIVE ULTRASOUND: SHX5996

## 2018-10-01 LAB — POCT I-STAT 7, (LYTES, BLD GAS, ICA,H+H)
Acid-Base Excess: 1 mmol/L (ref 0.0–2.0)
Bicarbonate: 25.9 mmol/L (ref 20.0–28.0)
Calcium, Ion: 1.13 mmol/L — ABNORMAL LOW (ref 1.15–1.40)
HCT: 29 % — ABNORMAL LOW (ref 39.0–52.0)
Hemoglobin: 9.9 g/dL — ABNORMAL LOW (ref 13.0–17.0)
O2 Saturation: 100 %
Patient temperature: 35.6
Potassium: 3.9 mmol/L (ref 3.5–5.1)
Sodium: 138 mmol/L (ref 135–145)
TCO2: 27 mmol/L (ref 22–32)
pCO2 arterial: 39.4 mmHg (ref 32.0–48.0)
pH, Arterial: 7.419 (ref 7.350–7.450)
pO2, Arterial: 227 mmHg — ABNORMAL HIGH (ref 83.0–108.0)

## 2018-10-01 LAB — HM DIABETES EYE EXAM

## 2018-10-01 LAB — POCT I-STAT 4, (NA,K, GLUC, HGB,HCT)
Glucose, Bld: 206 mg/dL — ABNORMAL HIGH (ref 70–99)
HCT: 29 % — ABNORMAL LOW (ref 39.0–52.0)
Hemoglobin: 9.9 g/dL — ABNORMAL LOW (ref 13.0–17.0)
Potassium: 4 mmol/L (ref 3.5–5.1)
Sodium: 138 mmol/L (ref 135–145)

## 2018-10-01 LAB — GLUCOSE, CAPILLARY
Glucose-Capillary: 148 mg/dL — ABNORMAL HIGH (ref 70–99)
Glucose-Capillary: 178 mg/dL — ABNORMAL HIGH (ref 70–99)
Glucose-Capillary: 185 mg/dL — ABNORMAL HIGH (ref 70–99)
Glucose-Capillary: 229 mg/dL — ABNORMAL HIGH (ref 70–99)
Glucose-Capillary: 246 mg/dL — ABNORMAL HIGH (ref 70–99)

## 2018-10-01 SURGERY — LAPAROSCOPY, DIAGNOSTIC
Anesthesia: Epidural | Site: Chest | Laterality: Right

## 2018-10-01 MED ORDER — ROPIVACAINE HCL 2 MG/ML IJ SOLN
8.0000 mL/h | INTRAMUSCULAR | Status: DC
  Administered 2018-10-01 – 2018-10-07 (×6): 8 mL/h via EPIDURAL
  Filled 2018-10-01 (×14): qty 200

## 2018-10-01 MED ORDER — NALOXONE HCL 0.4 MG/ML IJ SOLN: 0.4000 mg | INTRAMUSCULAR | Status: DC | PRN

## 2018-10-01 MED ORDER — CHLORHEXIDINE GLUCONATE CLOTH 2 % EX PADS
6.0000 | MEDICATED_PAD | Freq: Every day | CUTANEOUS | Status: DC
  Administered 2018-10-01 – 2018-10-05 (×5): 6 via TOPICAL

## 2018-10-01 MED ORDER — KCL IN DEXTROSE-NACL 20-5-0.45 MEQ/L-%-% IV SOLN
INTRAVENOUS | Status: DC
  Administered 2018-10-01 – 2018-10-16 (×13): via INTRAVENOUS
  Filled 2018-10-01 (×15): qty 1000

## 2018-10-01 MED ORDER — ONDANSETRON HCL 4 MG/2ML IJ SOLN: 4.0000 mg | Freq: Once | INTRAMUSCULAR | Status: DC | PRN

## 2018-10-01 MED ORDER — CEFAZOLIN SODIUM-DEXTROSE 2-4 GM/100ML-% IV SOLN
2.0000 g | Freq: Three times a day (TID) | INTRAVENOUS | Status: AC
  Administered 2018-10-01: 17:00:00 2 g via INTRAVENOUS
  Filled 2018-10-01: qty 100

## 2018-10-01 MED ORDER — CHLORHEXIDINE GLUCONATE CLOTH 2 % EX PADS: 6.0000 | MEDICATED_PAD | Freq: Once | CUTANEOUS | Status: DC

## 2018-10-01 MED ORDER — ALBUMIN HUMAN 5 % IV SOLN
INTRAVENOUS | Status: AC
  Administered 2018-10-01: 16:00:00
  Filled 2018-10-01: qty 250

## 2018-10-01 MED ORDER — HYDRALAZINE HCL 20 MG/ML IJ SOLN: 10.0000 mg | INTRAMUSCULAR | Status: DC | PRN

## 2018-10-01 MED ORDER — MIDAZOLAM HCL 2 MG/2ML IJ SOLN
INTRAMUSCULAR | Status: AC
  Filled 2018-10-01: qty 2

## 2018-10-01 MED ORDER — SCOPOLAMINE 1 MG/3DAYS TD PT72
1.0000 | MEDICATED_PATCH | TRANSDERMAL | Status: DC
  Administered 2018-10-01: 07:00:00 1.5 mg via TRANSDERMAL

## 2018-10-01 MED ORDER — CEFAZOLIN SODIUM-DEXTROSE 2-4 GM/100ML-% IV SOLN
INTRAVENOUS | Status: AC
  Filled 2018-10-01: qty 100

## 2018-10-01 MED ORDER — SODIUM CHLORIDE 0.9% FLUSH: 9.0000 mL | INTRAVENOUS | Status: DC | PRN

## 2018-10-01 MED ORDER — FENTANYL CITRATE (PF) 100 MCG/2ML IJ SOLN: 25.0000 ug | INTRAMUSCULAR | Status: DC | PRN

## 2018-10-01 MED ORDER — ONDANSETRON HCL 4 MG/2ML IJ SOLN
INTRAMUSCULAR | Status: DC | PRN
  Administered 2018-10-01: 4 mg via INTRAVENOUS

## 2018-10-01 MED ORDER — BUPIVACAINE-EPINEPHRINE (PF) 0.25% -1:200000 IJ SOLN
INTRAMUSCULAR | Status: AC
  Filled 2018-10-01: qty 30

## 2018-10-01 MED ORDER — INSULIN ASPART 100 UNIT/ML ~~LOC~~ SOLN
0.0000 [IU] | SUBCUTANEOUS | Status: DC
  Administered 2018-10-01: 4 [IU] via SUBCUTANEOUS
  Administered 2018-10-01 – 2018-10-02 (×2): 7 [IU] via SUBCUTANEOUS
  Administered 2018-10-02: 4 [IU] via SUBCUTANEOUS
  Administered 2018-10-02 (×2): 7 [IU] via SUBCUTANEOUS
  Administered 2018-10-02: 11 [IU] via SUBCUTANEOUS
  Administered 2018-10-02 – 2018-10-03 (×4): 4 [IU] via SUBCUTANEOUS
  Administered 2018-10-03 (×3): 7 [IU] via SUBCUTANEOUS
  Administered 2018-10-04 (×3): 4 [IU] via SUBCUTANEOUS
  Administered 2018-10-04 (×2): 7 [IU] via SUBCUTANEOUS
  Administered 2018-10-04: 08:00:00 4 [IU] via SUBCUTANEOUS
  Administered 2018-10-05 (×4): 7 [IU] via SUBCUTANEOUS
  Administered 2018-10-05: 4 [IU] via SUBCUTANEOUS
  Administered 2018-10-06 (×2): 7 [IU] via SUBCUTANEOUS
  Administered 2018-10-06 (×3): 4 [IU] via SUBCUTANEOUS
  Administered 2018-10-06: 7 [IU] via SUBCUTANEOUS
  Administered 2018-10-06 – 2018-10-07 (×3): 4 [IU] via SUBCUTANEOUS
  Administered 2018-10-07 (×3): 7 [IU] via SUBCUTANEOUS
  Administered 2018-10-08: 11 [IU] via SUBCUTANEOUS
  Administered 2018-10-08: 4 [IU] via SUBCUTANEOUS
  Administered 2018-10-08 (×4): 7 [IU] via SUBCUTANEOUS
  Administered 2018-10-08: 11 [IU] via SUBCUTANEOUS
  Administered 2018-10-09 (×4): 7 [IU] via SUBCUTANEOUS
  Administered 2018-10-09: 11 [IU] via SUBCUTANEOUS
  Administered 2018-10-10 (×3): 7 [IU] via SUBCUTANEOUS
  Administered 2018-10-10 (×3): 11 [IU] via SUBCUTANEOUS
  Administered 2018-10-11: 04:00:00 7 [IU] via SUBCUTANEOUS
  Administered 2018-10-11 (×2): 3 [IU] via SUBCUTANEOUS
  Administered 2018-10-11: 7 [IU] via SUBCUTANEOUS
  Administered 2018-10-11: 4 [IU] via SUBCUTANEOUS
  Administered 2018-10-11: 09:00:00 7 [IU] via SUBCUTANEOUS
  Administered 2018-10-12 (×2): 4 [IU] via SUBCUTANEOUS
  Administered 2018-10-12: 7 [IU] via SUBCUTANEOUS
  Administered 2018-10-12: 21:00:00 4 [IU] via SUBCUTANEOUS
  Administered 2018-10-12: 18:00:00 15 [IU] via SUBCUTANEOUS
  Administered 2018-10-12: 09:00:00 7 [IU] via SUBCUTANEOUS
  Administered 2018-10-13: 4 [IU] via SUBCUTANEOUS
  Administered 2018-10-13 – 2018-10-14 (×3): 7 [IU] via SUBCUTANEOUS
  Administered 2018-10-14: 4 [IU] via SUBCUTANEOUS
  Administered 2018-10-14: 7 [IU] via SUBCUTANEOUS
  Administered 2018-10-14: 09:00:00 4 [IU] via SUBCUTANEOUS
  Administered 2018-10-14: 3 [IU] via SUBCUTANEOUS
  Administered 2018-10-14 – 2018-10-15 (×4): 7 [IU] via SUBCUTANEOUS
  Administered 2018-10-15: 4 [IU] via SUBCUTANEOUS
  Administered 2018-10-15: 3 [IU] via SUBCUTANEOUS
  Administered 2018-10-15: 7 [IU] via SUBCUTANEOUS
  Administered 2018-10-16 (×2): 4 [IU] via SUBCUTANEOUS
  Administered 2018-10-16: 3 [IU] via SUBCUTANEOUS
  Administered 2018-10-17: 7 [IU] via SUBCUTANEOUS
  Administered 2018-10-17: 4 [IU] via SUBCUTANEOUS
  Administered 2018-10-17: 17:00:00 7 [IU] via SUBCUTANEOUS
  Administered 2018-10-17: 04:00:00 3 [IU] via SUBCUTANEOUS
  Administered 2018-10-17: 20 [IU] via SUBCUTANEOUS
  Administered 2018-10-17: 21:00:00 11 [IU] via SUBCUTANEOUS
  Administered 2018-10-18: 20:00:00 15 [IU] via SUBCUTANEOUS
  Administered 2018-10-18 (×2): 20 [IU] via SUBCUTANEOUS
  Administered 2018-10-18: 11 [IU] via SUBCUTANEOUS
  Administered 2018-10-18: 20 [IU] via SUBCUTANEOUS
  Administered 2018-10-18: 15 [IU] via SUBCUTANEOUS
  Administered 2018-10-19: 12:00:00 7 [IU] via SUBCUTANEOUS
  Administered 2018-10-19: 04:00:00 11 [IU] via SUBCUTANEOUS
  Administered 2018-10-19 (×4): 7 [IU] via SUBCUTANEOUS
  Administered 2018-10-20: 11:00:00 4 [IU] via SUBCUTANEOUS
  Administered 2018-10-20: 7 [IU] via SUBCUTANEOUS
  Administered 2018-10-20: 16:00:00 4 [IU] via SUBCUTANEOUS
  Administered 2018-10-20: 04:00:00 11 [IU] via SUBCUTANEOUS
  Administered 2018-10-20 – 2018-10-21 (×2): 7 [IU] via SUBCUTANEOUS
  Administered 2018-10-21 (×2): 11 [IU] via SUBCUTANEOUS
  Administered 2018-10-21: 20:00:00 15 [IU] via SUBCUTANEOUS
  Administered 2018-10-21: 08:00:00 11 [IU] via SUBCUTANEOUS
  Administered 2018-10-21: 20 [IU] via SUBCUTANEOUS
  Administered 2018-10-21 – 2018-10-22 (×2): 11 [IU] via SUBCUTANEOUS
  Administered 2018-10-22: 7 [IU] via SUBCUTANEOUS
  Administered 2018-10-22: 11 [IU] via SUBCUTANEOUS
  Administered 2018-10-22: 7 [IU] via SUBCUTANEOUS
  Administered 2018-10-22: 4 [IU] via SUBCUTANEOUS
  Administered 2018-10-22: 16:00:00 11 [IU] via SUBCUTANEOUS
  Administered 2018-10-23 (×5): 4 [IU] via SUBCUTANEOUS
  Administered 2018-10-24: 7 [IU] via SUBCUTANEOUS
  Administered 2018-10-24: 3 [IU] via SUBCUTANEOUS
  Administered 2018-10-24: 08:00:00 4 [IU] via SUBCUTANEOUS
  Administered 2018-10-24 (×3): 7 [IU] via SUBCUTANEOUS
  Administered 2018-10-25 (×5): 4 [IU] via SUBCUTANEOUS
  Administered 2018-10-25: 7 [IU] via SUBCUTANEOUS
  Administered 2018-10-25: 4 [IU] via SUBCUTANEOUS
  Administered 2018-10-26: 3 [IU] via SUBCUTANEOUS
  Administered 2018-10-26: 4 [IU] via SUBCUTANEOUS

## 2018-10-01 MED ORDER — DIPHENHYDRAMINE HCL 50 MG/ML IJ SOLN: 12.5000 mg | Freq: Four times a day (QID) | INTRAMUSCULAR | Status: DC | PRN

## 2018-10-01 MED ORDER — OXYCODONE HCL 5 MG PO TABS: 5.0000 mg | ORAL_TABLET | Freq: Once | ORAL | Status: DC | PRN

## 2018-10-01 MED ORDER — SUGAMMADEX SODIUM 200 MG/2ML IV SOLN
INTRAVENOUS | Status: DC | PRN
  Administered 2018-10-01: 200 mg via INTRAVENOUS

## 2018-10-01 MED ORDER — PROPOFOL 10 MG/ML IV BOLUS
INTRAVENOUS | Status: DC | PRN
  Administered 2018-10-01: 120 mg via INTRAVENOUS

## 2018-10-01 MED ORDER — SCOPOLAMINE 1 MG/3DAYS TD PT72
MEDICATED_PATCH | TRANSDERMAL | Status: AC
  Administered 2018-10-01: 1.5 mg via TRANSDERMAL
  Filled 2018-10-01: qty 1

## 2018-10-01 MED ORDER — PHENYLEPHRINE HCL (PRESSORS) 10 MG/ML IV SOLN
INTRAVENOUS | Status: AC
  Filled 2018-10-01: qty 1

## 2018-10-01 MED ORDER — MIDAZOLAM HCL 5 MG/5ML IJ SOLN
INTRAMUSCULAR | Status: DC | PRN
  Administered 2018-10-01: 1 mg via INTRAVENOUS

## 2018-10-01 MED ORDER — ENSURE PRE-SURGERY PO LIQD
296.0000 mL | Freq: Once | ORAL | Status: DC
  Filled 2018-10-01: qty 296

## 2018-10-01 MED ORDER — ALBUMIN HUMAN 5 % IV SOLN
INTRAVENOUS | Status: DC | PRN
  Administered 2018-10-01 (×2): via INTRAVENOUS

## 2018-10-01 MED ORDER — ONDANSETRON 4 MG PO TBDP: 4.0000 mg | ORAL_TABLET | Freq: Four times a day (QID) | ORAL | Status: DC | PRN

## 2018-10-01 MED ORDER — ENSURE SURGERY PO LIQD
237.0000 mL | Freq: Two times a day (BID) | ORAL | Status: DC
  Filled 2018-10-01: qty 237

## 2018-10-01 MED ORDER — PHENYLEPHRINE 40 MCG/ML (10ML) SYRINGE FOR IV PUSH (FOR BLOOD PRESSURE SUPPORT)
PREFILLED_SYRINGE | INTRAVENOUS | Status: DC | PRN
  Administered 2018-10-01: 40 ug via INTRAVENOUS
  Administered 2018-10-01: 120 ug via INTRAVENOUS
  Administered 2018-10-01 (×2): 40 ug via INTRAVENOUS

## 2018-10-01 MED ORDER — ACETAMINOPHEN 500 MG PO TABS
1000.0000 mg | ORAL_TABLET | ORAL | Status: AC
  Administered 2018-10-01: 1000 mg via ORAL

## 2018-10-01 MED ORDER — LIDOCAINE HCL (PF) 1 % IJ SOLN
INTRAMUSCULAR | Status: AC
  Filled 2018-10-01: qty 30

## 2018-10-01 MED ORDER — ROCURONIUM BROMIDE 10 MG/ML (PF) SYRINGE
PREFILLED_SYRINGE | INTRAVENOUS | Status: DC | PRN
  Administered 2018-10-01 (×3): 20 mg via INTRAVENOUS
  Administered 2018-10-01: 10 mg via INTRAVENOUS
  Administered 2018-10-01: 70 mg via INTRAVENOUS
  Administered 2018-10-01: 20 mg via INTRAVENOUS

## 2018-10-01 MED ORDER — ROCURONIUM BROMIDE 10 MG/ML (PF) SYRINGE
PREFILLED_SYRINGE | INTRAVENOUS | Status: AC
  Filled 2018-10-01: qty 10

## 2018-10-01 MED ORDER — OXYCODONE HCL 5 MG/5ML PO SOLN: 5.0000 mg | Freq: Once | ORAL | Status: DC | PRN

## 2018-10-01 MED ORDER — ONDANSETRON HCL 4 MG/2ML IJ SOLN
INTRAMUSCULAR | Status: AC
  Filled 2018-10-01: qty 2

## 2018-10-01 MED ORDER — HYDROMORPHONE 1 MG/ML IV SOLN
INTRAVENOUS | Status: DC
  Administered 2018-10-01: 2.4 mg via INTRAVENOUS
  Administered 2018-10-01: 25 mg via INTRAVENOUS
  Administered 2018-10-02: 0 mg via INTRAVENOUS
  Administered 2018-10-02: 0.8 mg via INTRAVENOUS
  Administered 2018-10-02: 0.3 mg via INTRAVENOUS
  Administered 2018-10-02 (×2): 0.4 mg via INTRAVENOUS
  Administered 2018-10-02: 0.8 mg via INTRAVENOUS
  Administered 2018-10-02: 3 mg via INTRAVENOUS
  Administered 2018-10-03: 0.6 mg via INTRAVENOUS
  Administered 2018-10-03: 0.2 mg via INTRAVENOUS
  Administered 2018-10-03: 0.8 mg via INTRAVENOUS
  Administered 2018-10-03 (×2): 0.4 mg via INTRAVENOUS
  Administered 2018-10-03: 5.7 mg via INTRAVENOUS
  Administered 2018-10-04 (×2): 0.6 mg via INTRAVENOUS
  Administered 2018-10-04: 0.4 mg via INTRAVENOUS
  Administered 2018-10-04: 0.6 mg via INTRAVENOUS
  Administered 2018-10-04: 0.8 mg via INTRAVENOUS
  Administered 2018-10-04: 0.6 mg via INTRAVENOUS
  Administered 2018-10-05: 0.4 mg via INTRAVENOUS
  Administered 2018-10-05: 0.6 mg via INTRAVENOUS
  Administered 2018-10-05: 1 mg via INTRAVENOUS
  Administered 2018-10-05: 0.4 mg via INTRAVENOUS
  Administered 2018-10-05: 1 mg via INTRAVENOUS
  Administered 2018-10-06: 0.4 mg via INTRAVENOUS
  Administered 2018-10-06: 0.8 mg via INTRAVENOUS
  Administered 2018-10-06: 0.4 mg via INTRAVENOUS
  Administered 2018-10-06 (×2): 0.6 mg via INTRAVENOUS
  Administered 2018-10-06: 0.8 mg via INTRAVENOUS
  Administered 2018-10-06: 1.6 mg via INTRAVENOUS
  Administered 2018-10-07: 1.8 mg via INTRAVENOUS
  Administered 2018-10-07: 0.8 mg via INTRAVENOUS
  Administered 2018-10-07: 0 mg via INTRAVENOUS
  Administered 2018-10-07: 12:00:00 via INTRAVENOUS
  Administered 2018-10-08: 0 mg via INTRAVENOUS
  Administered 2018-10-08 (×4): 0.8 mg via INTRAVENOUS
  Administered 2018-10-08: 0.2 mg via INTRAVENOUS
  Administered 2018-10-09: 1.2 mg via INTRAVENOUS
  Administered 2018-10-09: 1 mg via INTRAVENOUS
  Administered 2018-10-09: 0.8 mg via INTRAVENOUS
  Administered 2018-10-09: 0.6 mg via INTRAVENOUS
  Administered 2018-10-09 (×2): 0.4 mg via INTRAVENOUS
  Administered 2018-10-10: 0.6 mg via INTRAVENOUS
  Administered 2018-10-10: 1 mg via INTRAVENOUS
  Administered 2018-10-10: 0.4 mg via INTRAVENOUS
  Filled 2018-10-01 (×2): qty 30

## 2018-10-01 MED ORDER — CEFAZOLIN SODIUM-DEXTROSE 2-4 GM/100ML-% IV SOLN
2.0000 g | INTRAVENOUS | Status: AC
  Administered 2018-10-01: 2 g via INTRAVENOUS

## 2018-10-01 MED ORDER — METOPROLOL TARTRATE 5 MG/5ML IV SOLN
5.0000 mg | Freq: Four times a day (QID) | INTRAVENOUS | Status: AC
  Administered 2018-10-01 – 2018-10-02 (×4): 5 mg via INTRAVENOUS
  Filled 2018-10-01 (×4): qty 5

## 2018-10-01 MED ORDER — ONDANSETRON HCL 4 MG/2ML IJ SOLN: 4.0000 mg | Freq: Four times a day (QID) | INTRAMUSCULAR | Status: DC | PRN

## 2018-10-01 MED ORDER — FENTANYL CITRATE (PF) 250 MCG/5ML IJ SOLN
INTRAMUSCULAR | Status: AC
  Filled 2018-10-01: qty 5

## 2018-10-01 MED ORDER — LIDOCAINE 2% (20 MG/ML) 5 ML SYRINGE
INTRAMUSCULAR | Status: AC
  Filled 2018-10-01: qty 5

## 2018-10-01 MED ORDER — PANTOPRAZOLE SODIUM 40 MG IV SOLR
40.0000 mg | Freq: Every day | INTRAVENOUS | Status: DC
  Administered 2018-10-01 – 2018-10-04 (×4): 40 mg via INTRAVENOUS
  Filled 2018-10-01 (×4): qty 40

## 2018-10-01 MED ORDER — PROCHLORPERAZINE EDISYLATE 10 MG/2ML IJ SOLN: 5.0000 mg | Freq: Four times a day (QID) | INTRAMUSCULAR | Status: DC | PRN

## 2018-10-01 MED ORDER — PROCHLORPERAZINE MALEATE 10 MG PO TABS
10.0000 mg | ORAL_TABLET | Freq: Four times a day (QID) | ORAL | Status: DC | PRN
  Filled 2018-10-01: qty 1

## 2018-10-01 MED ORDER — ACETAMINOPHEN 10 MG/ML IV SOLN
1000.0000 mg | Freq: Four times a day (QID) | INTRAVENOUS | Status: AC
  Administered 2018-10-01 – 2018-10-02 (×4): 1000 mg via INTRAVENOUS
  Filled 2018-10-01 (×4): qty 100

## 2018-10-01 MED ORDER — 0.9 % SODIUM CHLORIDE (POUR BTL) OPTIME
TOPICAL | Status: DC | PRN
  Administered 2018-10-01: 10:00:00 1000 mL

## 2018-10-01 MED ORDER — DIPHENHYDRAMINE HCL 12.5 MG/5ML PO ELIX: 12.5000 mg | ORAL_SOLUTION | Freq: Four times a day (QID) | ORAL | Status: DC | PRN

## 2018-10-01 MED ORDER — DEXAMETHASONE SODIUM PHOSPHATE 10 MG/ML IJ SOLN
INTRAMUSCULAR | Status: DC | PRN
  Administered 2018-10-01: 10 mg via INTRAVENOUS

## 2018-10-01 MED ORDER — EPHEDRINE SULFATE 50 MG/ML IJ SOLN
INTRAMUSCULAR | Status: DC | PRN
  Administered 2018-10-01 (×3): 10 mg via INTRAVENOUS

## 2018-10-01 MED ORDER — LIDOCAINE 2% (20 MG/ML) 5 ML SYRINGE
INTRAMUSCULAR | Status: DC | PRN
  Administered 2018-10-01: 100 mg via INTRAVENOUS

## 2018-10-01 MED ORDER — SODIUM CHLORIDE 0.9 % IR SOLN
Status: DC | PRN
  Administered 2018-10-01: 1000 mL
  Administered 2018-10-01: 2000 mL

## 2018-10-01 MED ORDER — ACETAMINOPHEN 500 MG PO TABS
ORAL_TABLET | ORAL | Status: AC
  Administered 2018-10-01: 07:00:00 1000 mg via ORAL
  Filled 2018-10-01: qty 2

## 2018-10-01 MED ORDER — SODIUM CHLORIDE 0.9 % IV SOLN
INTRAVENOUS | Status: DC | PRN
  Administered 2018-10-05: 22:00:00 500 mL via INTRAVENOUS
  Administered 2018-10-08 – 2018-10-18 (×3): 250 mL via INTRAVENOUS
  Administered 2018-10-20 – 2018-10-21 (×2): 500 mL via INTRAVENOUS

## 2018-10-01 MED ORDER — LIDOCAINE-EPINEPHRINE (PF) 1.5 %-1:200000 IJ SOLN
INTRAMUSCULAR | Status: DC | PRN
  Administered 2018-10-01: 3 mL via EPIDURAL

## 2018-10-01 MED ORDER — ENSURE PRE-SURGERY PO LIQD
592.0000 mL | Freq: Once | ORAL | Status: DC
  Filled 2018-10-01: qty 592

## 2018-10-01 MED ORDER — METHOCARBAMOL 1000 MG/10ML IJ SOLN
500.0000 mg | Freq: Four times a day (QID) | INTRAVENOUS | Status: DC | PRN
  Administered 2018-10-02 – 2018-10-07 (×8): 500 mg via INTRAVENOUS
  Filled 2018-10-01 (×4): qty 5
  Filled 2018-10-01: qty 500
  Filled 2018-10-01 (×3): qty 5

## 2018-10-01 MED ORDER — DEXAMETHASONE SODIUM PHOSPHATE 10 MG/ML IJ SOLN
INTRAMUSCULAR | Status: AC
  Filled 2018-10-01: qty 1

## 2018-10-01 MED ORDER — PROPOFOL 10 MG/ML IV BOLUS
INTRAVENOUS | Status: AC
  Filled 2018-10-01: qty 20

## 2018-10-01 MED ORDER — LACTATED RINGERS IV SOLN
INTRAVENOUS | Status: DC | PRN
  Administered 2018-10-01 (×4): via INTRAVENOUS

## 2018-10-01 MED ORDER — FENTANYL CITRATE (PF) 100 MCG/2ML IJ SOLN
INTRAMUSCULAR | Status: DC | PRN
  Administered 2018-10-01 (×2): 50 ug via INTRAVENOUS
  Administered 2018-10-01: 100 ug via INTRAVENOUS

## 2018-10-01 MED ORDER — SODIUM CHLORIDE 0.9 % IV SOLN
INTRAVENOUS | Status: DC | PRN
  Administered 2018-10-01: 25 ug/min via INTRAVENOUS

## 2018-10-01 MED ORDER — LIDOCAINE HCL 1 % IJ SOLN
INTRAMUSCULAR | Status: DC | PRN
  Administered 2018-10-01: 5 mL

## 2018-10-01 SURGICAL SUPPLY — 138 items
BIOPATCH RED 1 DISK 7.0 (GAUZE/BANDAGES/DRESSINGS) IMPLANT
BIOPATCH RED 1IN DISK 7.0MM (GAUZE/BANDAGES/DRESSINGS)
BLADE CLIPPER SURG (BLADE) IMPLANT
CANISTER SUCT 3000ML PPV (MISCELLANEOUS) IMPLANT
CATH KIT ON-Q SILVERSOAK 7.5IN (CATHETERS) IMPLANT
CATH ROBINSON RED A/P 22FR (CATHETERS) IMPLANT
CATH THORACIC 28FR (CATHETERS) IMPLANT
CATH THORACIC 36FR (CATHETERS) IMPLANT
CATH THORACIC 36FR RT ANG (CATHETERS) IMPLANT
CHLORAPREP W/TINT 26ML (MISCELLANEOUS) ×6 IMPLANT
CLIP VESOCCLUDE LG 6/CT (CLIP) IMPLANT
CLIP VESOCCLUDE MED 24/CT (CLIP) IMPLANT
CLIP VESOLOCK LG 6/CT PURPLE (CLIP) IMPLANT
CLIP VESOLOCK MED 6/CT (CLIP) IMPLANT
CLIP VESOLOCK MED LG 6/CT (CLIP) IMPLANT
CONT SPEC 4OZ CLIKSEAL STRL BL (MISCELLANEOUS) ×6 IMPLANT
COVER SURGICAL LIGHT HANDLE (MISCELLANEOUS) ×6 IMPLANT
COVER WAND RF STERILE (DRAPES) ×12 IMPLANT
CUTTER FLEX LINEAR 45M (STAPLE) ×6 IMPLANT
DECANTER SPIKE VIAL GLASS SM (MISCELLANEOUS) ×12 IMPLANT
DERMABOND ADVANCED (GAUZE/BANDAGES/DRESSINGS)
DERMABOND ADVANCED .7 DNX12 (GAUZE/BANDAGES/DRESSINGS) IMPLANT
DRAIN CHANNEL 19F RND (DRAIN) ×6 IMPLANT
DRAIN CHANNEL 28F RND 3/8 FF (WOUND CARE) IMPLANT
DRAIN PENROSE 1/2X36 STERILE (WOUND CARE) IMPLANT
DRAIN PENROSE 18X1/2 LTX STRL (DRAIN) ×6 IMPLANT
DRAPE LAPAROSCOPIC ABDOMINAL (DRAPES) ×6 IMPLANT
DRAPE SLUSH/WARMER DISC (DRAPES) IMPLANT
DRAPE UTILITY XL STRL (DRAPES) ×6 IMPLANT
DRAPE WARM FLUID 44X44 (DRAPES) ×6 IMPLANT
DRILL BIT 7/64X5 (BIT) IMPLANT
DRSG COVADERM 4X10 (GAUZE/BANDAGES/DRESSINGS) IMPLANT
DRSG COVADERM 4X14 (GAUZE/BANDAGES/DRESSINGS) ×6 IMPLANT
DRSG COVADERM 4X8 (GAUZE/BANDAGES/DRESSINGS) IMPLANT
DRSG TEGADERM 4X4.75 (GAUZE/BANDAGES/DRESSINGS) IMPLANT
ELECT BLADE 4.0 EZ CLEAN MEGAD (MISCELLANEOUS) ×6
ELECT BLADE 6.5 EXT (BLADE) ×12 IMPLANT
ELECT REM PT RETURN 9FT ADLT (ELECTROSURGICAL) ×6
ELECTRODE BLDE 4.0 EZ CLN MEGD (MISCELLANEOUS) ×4 IMPLANT
ELECTRODE REM PT RTRN 9FT ADLT (ELECTROSURGICAL) ×4 IMPLANT
EVACUATOR SILICONE 100CC (DRAIN) ×6 IMPLANT
GAUZE SPONGE 4X4 12PLY STRL (GAUZE/BANDAGES/DRESSINGS) ×12 IMPLANT
GAUZE SPONGE 4X4 12PLY STRL LF (GAUZE/BANDAGES/DRESSINGS) ×6 IMPLANT
GLOVE BIO SURGEON STRL SZ 6 (GLOVE) ×12 IMPLANT
GLOVE BIO SURGEON STRL SZ 6.5 (GLOVE) ×10 IMPLANT
GLOVE BIO SURGEON STRL SZ7.5 (GLOVE) ×6 IMPLANT
GLOVE BIO SURGEONS STRL SZ 6.5 (GLOVE) ×2
GLOVE INDICATOR 6.5 STRL GRN (GLOVE) ×6 IMPLANT
GLOVE SURG ORTHO 8.0 STRL STRW (GLOVE) ×18 IMPLANT
GOWN STRL REUS W/ TWL LRG LVL3 (GOWN DISPOSABLE) ×20 IMPLANT
GOWN STRL REUS W/TWL 2XL LVL3 (GOWN DISPOSABLE) ×6 IMPLANT
GOWN STRL REUS W/TWL LRG LVL3 (GOWN DISPOSABLE) ×10
GOWN STRL REUS W/TWL XL LVL3 (GOWN DISPOSABLE) ×12 IMPLANT
HEMOSTAT ARISTA ABSORB 3G PWDR (HEMOSTASIS) ×6 IMPLANT
KIT BASIN OR (CUSTOM PROCEDURE TRAY) ×6 IMPLANT
KIT TUBE JEJUNAL 16FR (CATHETERS) ×6 IMPLANT
KIT TURNOVER KIT B (KITS) ×12 IMPLANT
L-HOOK LAP DISP 36CM (ELECTROSURGICAL) ×6
LHOOK LAP DISP 36CM (ELECTROSURGICAL) ×4 IMPLANT
NEEDLE SPNL 18GX3.5 QUINCKE PK (NEEDLE) IMPLANT
NS IRRIG 1000ML POUR BTL (IV SOLUTION) ×12 IMPLANT
PACK CHEST (CUSTOM PROCEDURE TRAY) IMPLANT
PACK GENERAL/GYN (CUSTOM PROCEDURE TRAY) ×6 IMPLANT
PAD ARMBOARD 7.5X6 YLW CONV (MISCELLANEOUS) ×12 IMPLANT
PASSER SUT SWANSON 36MM LOOP (INSTRUMENTS) IMPLANT
PENCIL BUTTON HOLSTER BLD 10FT (ELECTRODE) IMPLANT
PENCIL SMOKE EVACUATOR (MISCELLANEOUS) ×6 IMPLANT
RELOAD 45 VASCULAR/THIN (ENDOMECHANICALS) ×12 IMPLANT
RELOAD LINEAR CUT PROX 55 BLUE (ENDOMECHANICALS) ×6 IMPLANT
RELOAD PROXIMATE 30MM BLUE (ENDOMECHANICALS) ×6 IMPLANT
RELOAD STAPLE TA45 3.5 REG BLU (ENDOMECHANICALS) ×6 IMPLANT
RELOAD STAPLER LINEAR PROX 30 (STAPLE) ×4 IMPLANT
SCISSORS LAP 5X35 DISP (ENDOMECHANICALS) IMPLANT
SET IRRIG TUBING LAPAROSCOPIC (IRRIGATION / IRRIGATOR) ×6 IMPLANT
SET TUBE SMOKE EVAC HIGH FLOW (TUBING) ×6 IMPLANT
SHEARS FOC LG CVD HARMONIC 17C (MISCELLANEOUS) ×6 IMPLANT
SLEEVE ENDOPATH XCEL 5M (ENDOMECHANICALS) ×6 IMPLANT
SLEEVE SUCTION 125 (MISCELLANEOUS) ×6 IMPLANT
SLEEVE SURGEON STRL (DRAPES) ×6 IMPLANT
SOLUTION ANTI FOG 6CC (MISCELLANEOUS) IMPLANT
SPECIMEN JAR LARGE (MISCELLANEOUS) ×6 IMPLANT
SPECIMEN JAR LG PLASTIC EMPTY (MISCELLANEOUS) IMPLANT
SPECIMEN JAR MEDIUM (MISCELLANEOUS) IMPLANT
SPONGE LAP 18X18 RF (DISPOSABLE) IMPLANT
SPONGE LAP 18X18 X RAY DECT (DISPOSABLE) ×96 IMPLANT
STAPLE ECHEON FLEX 60 POW ENDO (STAPLE) IMPLANT
STAPLER PROXIMATE 55 BLUE (STAPLE) ×30 IMPLANT
STAPLER RELOAD LINEAR PROX 30 (STAPLE) ×6
STAPLER VISISTAT 35W (STAPLE) ×6 IMPLANT
STOPCOCK 4 WAY LG BORE MALE ST (IV SETS) IMPLANT
SUT ETHILON 2 0 FS 18 (SUTURE) ×18 IMPLANT
SUT MNCRL AB 4-0 PS2 18 (SUTURE) ×6 IMPLANT
SUT PDS AB 1 TP1 96 (SUTURE) ×12 IMPLANT
SUT PDS AB 3-0 SH 27 (SUTURE) ×18 IMPLANT
SUT PROLENE 2 0 SH DA (SUTURE) ×48 IMPLANT
SUT PROLENE 3 0 SH DA (SUTURE) IMPLANT
SUT PROLENE 4 0 RB 1 (SUTURE)
SUT PROLENE 4-0 RB1 .5 CRCL 36 (SUTURE) IMPLANT
SUT SILK  1 MH (SUTURE)
SUT SILK 1 MH (SUTURE) IMPLANT
SUT SILK 1 TIES 10X30 (SUTURE) IMPLANT
SUT SILK 2 0 SH CR/8 (SUTURE) ×12 IMPLANT
SUT SILK 2 0 TIES 10X30 (SUTURE) ×6 IMPLANT
SUT SILK 2 0SH CR/8 30 (SUTURE) ×6 IMPLANT
SUT SILK 3 0 SH CR/8 (SUTURE) ×6 IMPLANT
SUT SILK 3 0 TIES 10X30 (SUTURE) ×6 IMPLANT
SUT SILK 3 0SH CR/8 30 (SUTURE) IMPLANT
SUT VIC AB 1 CTX 18 (SUTURE) IMPLANT
SUT VIC AB 1 CTX 36 (SUTURE)
SUT VIC AB 1 CTX36XBRD ANBCTR (SUTURE) IMPLANT
SUT VIC AB 2-0 CTX 36 (SUTURE) IMPLANT
SUT VIC AB 3-0 X1 27 (SUTURE) IMPLANT
SUT VICRYL 2 TP 1 (SUTURE) IMPLANT
SYR 10ML LL (SYRINGE) ×6 IMPLANT
SYR 5ML LL (SYRINGE) IMPLANT
SYR BULB IRRIGATION 50ML (SYRINGE) ×6 IMPLANT
SYRINGE 60CC LL (MISCELLANEOUS) IMPLANT
SYSTEM SAHARA CHEST DRAIN ATS (WOUND CARE) IMPLANT
TAPE CLOTH SURG 4X10 WHT LF (GAUZE/BANDAGES/DRESSINGS) ×6 IMPLANT
TAPE UMBILICAL COTTON 1/8X30 (MISCELLANEOUS) IMPLANT
TOWEL GREEN STERILE (TOWEL DISPOSABLE) IMPLANT
TOWEL GREEN STERILE FF (TOWEL DISPOSABLE) ×6 IMPLANT
TOWEL OR 17X24 6PK STRL BLUE (TOWEL DISPOSABLE) ×6 IMPLANT
TOWEL OR 17X26 10 PK STRL BLUE (TOWEL DISPOSABLE) ×6 IMPLANT
TRAP SPECIMEN MUCOUS 40CC (MISCELLANEOUS) IMPLANT
TRAY FOLEY CATH 16FRSI W/METER (SET/KITS/TRAYS/PACK) ×6 IMPLANT
TRAY FOLEY MTR SLVR 16FR STAT (SET/KITS/TRAYS/PACK) ×6 IMPLANT
TRAY LAPAROSCOPIC MC (CUSTOM PROCEDURE TRAY) ×6 IMPLANT
TROCAR BLADELESS 12MM (ENDOMECHANICALS) IMPLANT
TROCAR XCEL BLUNT TIP 100MML (ENDOMECHANICALS) IMPLANT
TROCAR XCEL NON-BLD 11X100MML (ENDOMECHANICALS) IMPLANT
TROCAR XCEL NON-BLD 5MMX100MML (ENDOMECHANICALS) ×6 IMPLANT
TUBE CONNECTING 12'X1/4 (SUCTIONS) ×1
TUBE CONNECTING 12X1/4 (SUCTIONS) ×5 IMPLANT
TUBING EXTENTION W/L.L. (IV SETS) ×6 IMPLANT
TUNNELER SHEATH ON-Q 16GX12 DP (PAIN MANAGEMENT) IMPLANT
WATER STERILE IRR 1000ML POUR (IV SOLUTION) IMPLANT
YANKAUER SUCT BULB TIP NO VENT (SUCTIONS) ×12 IMPLANT

## 2018-10-01 NOTE — Anesthesia Procedure Notes (Signed)
Arterial Line Insertion Start/End6/06/2018 7:25 AM, 10/01/2018 7:35 AM Performed by: Moshe Salisbury, CRNA, CRNA  Patient location: Pre-op. Preanesthetic checklist: patient identified, IV checked, site marked, risks and benefits discussed, surgical consent, monitors and equipment checked, pre-op evaluation, timeout performed and anesthesia consent Lidocaine 1% used for infiltration Right, radial was placed Catheter size: 20 G Hand hygiene performed , maximum sterile barriers used  and Seldinger technique used Allen's test indicative of satisfactory collateral circulation Attempts: 1 Procedure performed without using ultrasound guided technique. Following insertion, dressing applied and Biopatch. Post procedure assessment: normal  Patient tolerated the procedure well with no immediate complications.

## 2018-10-01 NOTE — Interval H&P Note (Signed)
History and Physical Interval Note:  10/01/2018 7:28 AM  Tristan Hughes  has presented today for surgery, with the diagnosis of gastroesophageal junction carcinoma.  The various methods of treatment have been discussed with the patient and family. After consideration of risks, benefits and other options for treatment, the patient has consented to  Procedure(s) with comments: LAPAROSCOPY DIAGNOSTIC (N/A) - AND EPIDURAL ANESTHESIA SUBTOTAL VS TOTAL GASTRECTOMY, FEEDING TUBE PLACEMENT (N/A) - AND EPIDURAL ANESTHESIA POSSIBLE DISTAL PANCREATECTOMY (N/A) - AND EPIDURAL ANESTHESIA Possible Right THORACOTOMY MAJOR (Right) as a surgical intervention.  The patient's history has been reviewed, patient examined, no change in status, stable for surgery.  I have reviewed the patient's chart and labs.  Questions were answered to the patient's satisfaction.     Stark Klein

## 2018-10-01 NOTE — Progress Notes (Signed)
Will start dilaudid drip once PCA pump arrives.  Donnetta Simpers, RN

## 2018-10-01 NOTE — Anesthesia Procedure Notes (Signed)
Epidural Patient location during procedure: OB Start time: 10/01/2018 7:10 AM End time: 10/01/2018 7:25 AM  Staffing Anesthesiologist: Lidia Collum, MD Performed: anesthesiologist   Preanesthetic Checklist Completed: patient identified, surgical consent, pre-op evaluation, timeout performed, IV checked, risks and benefits discussed and monitors and equipment checked  Epidural Patient position: sitting Prep: DuraPrep Patient monitoring: heart rate, continuous pulse ox, blood pressure and cardiac monitor Approach: left paramedian Location: thoracic (1-12) Injection technique: LOR air  Needle:  Needle type: Tuohy  Needle gauge: 17 G Needle length: 9 cm Needle insertion depth: 6 cm Catheter type: closed end flexible Catheter size: 19 Gauge Catheter at skin depth: 11 cm Test dose: negative and 1.5% lidocaine with Epi 1:200 K  Assessment Events: blood not aspirated, injection not painful, no injection resistance, negative IV test and no paresthesia  Additional Notes Reason for block:procedure for pain

## 2018-10-01 NOTE — H&P (Addendum)
[address]       [place] [address]             [phone]                    Referring: Eulas Post, MD Primary Care: Eulas Post, MD Primary Cardiologist: No primary care provider on file.  Chief Complaint:    Gastric cancer    History of Present Illness:    Tristan Hughes 78 y.o. 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  The patient noted the onset of upper abdominal and gastric discomfort in October 2019.  Initially tried Tums and acid blockers without much effect.  Upper GI endoscopy was done with nonbleeding ulceration involving the lesser curvature of the stomach.  The patient has started on FOLFOX for which he has had 3 cycles.  He has not had radiation.  He has been referred by Dr. Barry Dienes, in case surgical resection would require an Ivor Bobby Rumpf, with anastomosis in the right chest, if an adequate gastric esophageal margin cannot be obtained from the abdomen.   Patient completed preoperative chemotherapy on March 6.  He notes since then he has been relatively asymptomatic.  Taking a p.o. diet without difficulty maintaining his weight at a stable 197 pounds.  He has had no swallowing difficulties   Patient has a previous cardiac history having had coronary artery bypass grafting in 2012, while living in [place], he is currently followed from a cardiac standpoint by Dr. Bettina Gavia.  Patient is a lifelong non-smoker, does not drink alcohol, he retired from [employer].   Family history and assessment for his mother who died age 96 of pancreatic cancer   Diagnosis initial biopsy of the stomach mass showed Surgical [P], GE junction-ulcerated lesion - POORLY DIFFERENTIATED ADENOCARCINOMA. - SEE COMMENT. Microscopic Comment The features slightly favor a gastric primary rather than an esophageal primary. Dr. Vicente Males has reviewed the case and concurs  with this interpretation. Dr. Fuller Plan was paged on 04/26/2018. (JBK:ah 04/26/18) Enid Cutter MD   Current Activity/ Functional Status:  Patient is independent with mobility/ambulation, transfers, ADL's, IADL's.   Zubrod Score: At the time of surgery this patient's most appropriate activity status/level should be described as: []     0    Normal activity, no symptoms [x]     1    Restricted in physical strenuous activity but ambulatory, able to do out light work []     2    Ambulatory and capable of self care, unable to do work activities, up and about               >50 % of waking hours                              []     3    Only limited self care, in bed greater than 50% of waking hours []     4    Completely disabled, no self care, confined to bed or chair []     5    Moribund   Past Medical History:  Diagnosis Date  . Anxiety   . CAD 07/09/2009   Cath 12-02-10 90% left main, 90%LAD, 99% circumflex, 80% RCA  . Cancer ([facility])    Stomach and esophagus. 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  Cataract    basal cell CA- left ear  . COLONIC POLYPS, HX OF 07/09/2009   Qualifier: Diagnosis of  By: Valma Cava LPN, [name]    . Depression   . DIAB W/O COMP TYPE II/UNS NOT STATED UNCNTRL 07/09/2009   Qualifier: Diagnosis of  By: Joyce Gross    . Family history of colon cancer   . Family history of pancreatic cancer   . GERD (gastroesophageal reflux disease)   . History of chicken pox   . HYPERLIPIDEMIA 07/09/2009   Qualifier: Diagnosis of  By: Valma Cava LPN, [name]    . HYPERTENSION 07/09/2009   Qualifier: Diagnosis of  By: Valma Cava LPN, [name]    . OBSTRUCTIVE SLEEP APNEA 07/09/2009   Qualifier: Diagnosis of  By: Joyce Gross    . Sleep apnea    does not uses CPAP    Past Surgical History:  Procedure Laterality Date  . BIOPSY  07/22/2018   Procedure: BIOPSY;  Surgeon: Mansouraty, Telford Nab., MD;  Location: [facility];  Service: Endoscopy;;  . BIOPSY BREAST Left   . CHOLECYSTECTOMY    .  COLONOSCOPY    . CORONARY ANGIOPLASTY WITH STENT PLACEMENT    . CORONARY ARTERY BYPASS GRAFT  8/12   CABG X 4 LIMA-LAD, SVG-PDB, SVG-OM/RAMUS   /  . ESOPHAGOGASTRODUODENOSCOPY N/A 07/22/2018   Procedure: ESOPHAGOGASTRODUODENOSCOPY (EGD);  Surgeon: Rush Landmark Telford Nab., MD;  Location: [facility];  Service: Endoscopy;  Laterality: N/A;  . EUS N/A 07/22/2018   Procedure: UPPER ENDOSCOPIC ULTRASOUND (EUS) RADIAL;  Surgeon: Rush Landmark Telford Nab., MD;  Location: [facility];  Service: Endoscopy;  Laterality: N/A;  . EUS  07/22/2018   Procedure: UPPER ENDOSCOPIC ULTRASOUND (EUS) LINEAR;  Surgeon: Rush Landmark Telford Nab., MD;  Location: [facility];  Service: Endoscopy;;  . EYE SURGERY Bilateral    catarct  . FINE NEEDLE ASPIRATION  07/22/2018   Procedure: FINE NEEDLE ASPIRATION (FNA) LINEAR;  Surgeon: Irving Copas., MD;  Location: [facility];  Service: Endoscopy;;  . IR IMAGING GUIDED PORT INSERTION  05/15/2018  . TONSILLECTOMY      Family History  Problem Relation Age of Onset  . Hypertension Mother   . Heart disease Mother   . Stroke Mother   . Diabetes Mother   . Pancreatic cancer Mother   . Heart disease Father   . Hypertension Father   . Colon cancer Maternal Uncle        dx >50; d. 82  . Parkinson's disease Sister   . Other Maternal Aunt        infant death  . Other Paternal Aunt        infant death  . Heart disease Maternal Grandfather   . Heart disease Paternal Grandfather   . Esophageal cancer Neg Hx   . Stomach cancer Neg Hx   . Rectal cancer Neg Hx    Patient has 8 children, all healthy, one grandchild with Down syndrome and esophageal atresia, patient's mother died at age 54 with pancreatic cancer   Social History   Tobacco Use  Smoking Status Never Smoker  Smokeless Tobacco Never Used    Social History   Substance and Sexual Activity  Alcohol Use No     Allergies  Allergen Reactions  . Naproxen Sodium Swelling    SWELLING REACTION  UNSPECIFIED     Current Facility-Administered Medications  Medication Dose Route Frequency Provider Last Rate Last Dose  . ceFAZolin (ANCEF) 2-4 GM/100ML-% IVPB           .  ceFAZolin (ANCEF) IVPB 2g/100 mL premix  2 g Intravenous On Call to [place], MD      . Chlorhexidine Gluconate Cloth 2 % PADS 6 each  6 each Topical Once Stark Klein, MD       And  . Chlorhexidine Gluconate Cloth 2 % PADS 6 each  6 each Topical Once Stark Klein, MD      . Derrill Memo ON 10/02/2018] feeding supplement (ENSURE PRE-SURGERY) liquid 296 mL  296 mL Oral Once Stark Klein, MD      . feeding supplement (ENSURE PRE-SURGERY) liquid 592 mL  592 mL Oral Once Stark Klein, MD      . feeding supplement (ENSURE SURGERY) liquid 237 mL  237 mL Oral BID BM Stark Klein, MD      . scopolamine (TRANSDERM-SCOP) 1 MG/3DAYS 1.5 mg  1 patch Transdermal On Call to [place], MD   1.5 mg at 10/01/18 5102    Pertinent items are noted in HPI.   Review of Systems:     Cardiac Review of Systems: [Y] = yes  or   [ N ] = no   Chest Pain [  N ]  Resting SOB [  N] Exertional SOB  [ Y ]  Orthopnea Aqua.Slicker  ]   Pedal Edema Aqua.Slicker  ]    Palpitations Aqua.Slicker ] Syncope  [ N ]   Presyncope Aqua.Slicker  ]   General Review of Systems: [Y] = yes [  ]=no Constitional: recent weight change [  ];  Wt loss over the last 3 months [   ] anorexia [  ]; fatigue [  ]; nausea [  ]; night sweats [  ]; fever [  ]; or chills [  ];           Eye : blurred vision [  ]; diplopia [   ]; vision changes [  ];  Amaurosis fugax[  ]; Resp: cough [  ];  wheezing[  ];  hemoptysis[  ]; shortness of breath[  ]; paroxysmal nocturnal dyspnea[  ]; dyspnea on exertion[  ]; or orthopnea[  ];  GI:  gallstones[  ], vomiting[  ];  dysphagia[  ]; melena[  ];  hematochezia [  ]; heartburn[  ];   Hx of  Colonoscopy[  ]; GU: kidney stones [  ]; hematuria[  ];   dysuria [  ];  nocturia[  ];  history of     obstruction [  ]; urinary frequency [ y ]             Skin: rash, swelling[  ];,  hair loss[  ];  peripheral edema[  ];  or itching[  ]; Musculosketetal: myalgias[  ];  joint swelling[  ];  joint erythema[  ];  joint pain[  ];  back pain[  ];  Heme/Lymph: bruising[  ];  bleeding[  ];  anemia[  ];  Neuro: TIA[  ];  headaches[  ];  stroke[  ];  vertigo[  ];  seizures[  ];   paresthesias[  ];  difficulty walking[  ];  Psych:depression[  ]; anxiety[  ];  Endocrine: diabetes[  ];  thyroid dysfunction[  ];  Immunizations: Flu up to date [  ]; Pneumococcal up to date [  ];  Other:  Wt Readings from Last 3 Encounters:  10/01/18 94.8 kg  09/25/18 94.8 kg  09/04/18 96.3 kg     PHYSICAL EXAMINATION:  General appearance: alert, cooperative and no distress  Head: Normocephalic, without obvious abnormality, atraumatic Neck: no adenopathy, no carotid bruit, no JVD, supple, symmetrical, trachea midline and thyroid not enlarged, symmetric, no tenderness/mass/nodules Lymph nodes: Cervical, supraclavicular, and axillary nodes normal. Resp: clear to auscultation bilaterally Cardio: regular rate and rhythm, S1, S2 normal, no murmur, click, rub or gallop GI: soft, non-tender; bowel sounds normal; no masses,  no organomegaly Extremities: extremities normal, atraumatic, no cyanosis or edema and Homans sign is negative, no sign of DVT Neurologic: Grossly normal  Diagnostic Studies & Laboratory data:     Recent Radiology Findings:   CLINICAL DATA:  Initial treatment strategy for gastric cancer.  EXAM: NUCLEAR MEDICINE PET SKULL BASE TO THIGH  TECHNIQUE: 10.6 mCi F-18 FDG was injected intravenously. Full-ring PET imaging was performed from the skull base to thigh after the radiotracer. CT data was obtained and used for attenuation correction and anatomic localization.  Fasting blood glucose: 73 mg/dl  COMPARISON:  04/29/2018  FINDINGS: Mediastinal blood pool activity: SUV max 2.7  NECK: Enlarged left thyroid lobe with small calcifications and hypodensities but no  accentuated metabolic activity to suggest a significant nodule.  Incidental CT findings: Left common carotid atherosclerotic calcification.  CHEST: The hypermetabolic activity in the stomach extends slightly into the distal esophagus, with esophageal portion having a maximum SUV of 7.6.  Incidental CT findings: Right Port-A-Cath tip: SVC. Coronary, aortic arch, and branch vessel atherosclerotic vascular disease. Mild-to-moderate cardiomegaly. Prior median sternotomy. Mild bilateral gynecomastia. Atrophic right infraspinatus muscle. Pleural thickening and pleural calcification on the left side. There also some calcified pleural plaques on the right side.  ABDOMEN/PELVIS: Wall thickening and accentuated activity in the gastric fundus extending into the esophagus, maximum SUV in this vicinity 11.2.  There is unusually high activity in multiple loops of small and large bowel, without CT correlate, compatible with considerable accentuated physiologic activity. Scattered lymph nodes in the mesentery are not appreciably hypermetabolic.  8 mm right gastric node on image 104/4, questionable accentuated metabolic activity. Adjacent vascular structures noted.  Descending duodenal diverticulum.  Incidental CT findings: Prominent stool throughout the colon favors constipation. Redundant sigmoid colon extending into the right upper quadrant. Calcifications in the spleen compatible with old granulomatous disease. Aortoiliac atherosclerotic vascular disease. Small pelvic lymph nodes are not pathologically enlarged by size criteria. Right scrotal hydrocele.  SKELETON: No significant abnormal hypermetabolic activity in this region.  Incidental CT findings: Nonunited right seventh and eighth rib fractures. Grade 1 degenerative anterolisthesis at L5-S1. Lower lumbar spondylosis.  IMPRESSION: 1. Hypermetabolic mass primarily in the gastric cardia region extending slightly into  the distal esophagus, maximum SUV 11.2, compatible with malignancy. Two small adjacent right gastric lymph nodes are indistinct and not definitively hypermetabolic but could possibly be involved. No findings of metastatic disease to the liver or elsewhere. 2. Prominent physiologic activity in small and large bowel, without CT correlate. 3. No hypermetabolic rib lesion. 4. Other imaging findings of potential clinical significance: Mild thyroid goiter. Aortic Atherosclerosis (ICD10-I70.0). Mild-to-moderate cardiomegaly. Coronary atherosclerosis. Atrophic right infraspinatus muscle. Pleural calcifications, left greater than right. Prominent stool throughout the colon favors constipation. Lower lumbar spondylosis. Old granulomatous disease.   Electronically Signed   By: Van Clines M.D.   On: 05/21/2018 17:50  Ct Chest W Contrast  Result Date: 07/29/2018 CLINICAL DATA:  Gastric cancer, status post neoadjuvant chemotherapy EXAM: CT CHEST, ABDOMEN, AND PELVIS WITH CONTRAST TECHNIQUE: Multidetector CT imaging of the chest, abdomen and pelvis was performed following the standard protocol during bolus administration of intravenous contrast. CONTRAST:  154mL OMNIPAQUE IOHEXOL 300 MG/ML  SOLN COMPARISON:  PET-CT dated 05/21/2018 FINDINGS: CT CHEST FINDINGS Cardiovascular: The heart is normal in size. No pericardial effusion. No evidence thoracic aortic aneurysm. Atherosclerotic calcifications of the aortic arch. Three vessel coronary atherosclerosis. Right chest port terminates in the lower SVC. Mediastinum/Nodes: No suspicious mediastinal lymphadenopathy. Calcified right perihilar nodes. Visualized left thyroid is enlarged/nodular, with a dominant 2.9 cm nodule (series 2/image 11). Lungs/Pleura: Calcified subpleural nodularity in the posterior right upper lobe (series 6/image 47). Additional calcified granuloma in the right lower lobe (series 6/image 87). No suspicious pulmonary nodules.  Pleural-based calcifications in the left hemithorax. Associated pleural thickening. Subpleural reticulation the bilateral lung bases. No focal consolidation. No pleural effusion or pneumothorax. Musculoskeletal: Degenerative changes of the visualized thoracolumbar spine. Right rib defects. Median sternotomy. CT ABDOMEN PELVIS FINDINGS Hepatobiliary: Liver is within normal limits. Status post cholecystectomy. No intrahepatic ductal dilatation. Common duct is mildly prominent, measuring 9 mm, likely postoperative. Pancreas: Within normal limits. Spleen: Calcified splenic granulomata. Adrenals/Urinary Tract: Adrenal glands are within normal limits. Kidneys are within normal limits.  No hydronephrosis. Bladder is within normal limits. Stomach/Bowel: Very mild wall thickening at the GE junction (series 2/image 54). Prior mass at the gastric cardia has essentially resolved. No evidence of bowel obstruction. Appendix is not discretely visualized. Vascular/Lymphatic: No evidence of abdominal aortic aneurysm. Atherosclerotic calcifications of the abdominal aorta and branch vessels. No suspicious abdominopelvic lymphadenopathy. Reproductive: Enlargement the central gland of the prostate, suggesting BPH. Other: No abdominopelvic ascites. Musculoskeletal: Degenerative changes of the lumbar spine. IMPRESSION: Very mild wall thickening at the GE junction. Prior mass at the gastric cardia has essentially resolved. No evidence of metastatic disease. Sequela of prior granulomatous disease, as above. Electronically Signed   By: Julian Hy M.D.   On: 07/29/2018 15:09     I have independently reviewed the above radiology studies  and reviewed the findings with the patient.   Recent Lab Findings: Lab Results  Component Value Date   WBC 7.2 09/25/2018   HGB 14.8 09/25/2018   HCT 45.9 09/25/2018   PLT 191 09/25/2018   GLUCOSE 164 (H) 09/25/2018   CHOL 104 11/16/2017   TRIG 98.0 11/16/2017   HDL 39.60 11/16/2017    LDLCALC 45 11/16/2017   ALT 21 09/25/2018   AST 24 09/25/2018   NA 139 09/25/2018   K 4.6 09/25/2018   CL 101 09/25/2018   CREATININE 1.11 09/25/2018   BUN 23 09/25/2018   CO2 27 09/25/2018   INR 0.90 05/15/2018   HGBA1C 5.8 (H) 09/25/2018   ENDOSCOPY: - An infiltrative and ulcerated, non-circumferential, friable mass with no bleeding and no stigmata of recent bleeding involving the lesser curvature of the stomach and the cardia measuring approximately 4 cm x 2 cm. Biopsies were taken with a cold forceps for histology. Findings: - The exam of the stomach was otherwise normal. - One superficial distal esophageal ulcer which was an extention of the ulcerated gastric mass with no bleeding and no stigmata of recent bleeding was found 41 cm from the incisors at the EGJ . - The exam of the esophagus was otherwise normal. - A small non-bleeding diverticulum was found in the second portion of the duodenum. - The exam of the duodenum was otherwise normal    EUS: ENDOSCOPIC FINDING: Findings: No gross lesions were noted in the proximal esophagus and in the mid esophagus. The Z-line was at 43.5 cm. One linear esophageal ulcerated though healing region was found 41 to 42.5  cm from the incisors. After EUS had been completed, biopsies were taken with a cold forceps for histology purposes. A large, infiltrative and ulcerated, partially circumferential (involving one-third of the lumen circumference) mass with no bleeding and stigmata of recent bleeding was found at the cardia with extension to the gastroesophageal junction and the above noted healing ulceration in the very distal esophagus. Patchy moderately erythematous mucosa was found in the gastric body, at the incisura and in the gastric antrum. Biopsies were taken with a cold forceps for histology and Helicobacter pylori testing. One non-bleeding cratered gastric ulcer was found at the pylorus. The lesion was 13 mm in largest  dimension. Biopsies were taken with a cold forceps for histology. Patchy moderately erythematous mucosa without active bleeding and with no stigmata of bleeding was found in the duodenal bulb and in the D1/D2 sweep. No gross lesions were noted in the second portion of the duodenum. A large diverticulum was found in the area of the papilla. ENDOSONOGRAPHIC FINDING: A hypoechoic non-circumferential mass was identified endosonographically in the cardia of the stomach. The mass begins at the GE junction 42.5 cm. The Z-line is at 43.5 cm. The distal extent of the tumor is at 49 cm. The endosonographic borders were well-defined. There was sonographic evidence suggesting invasion into the muscularis propria (Layer 4). One malignant-appearing lymph node was visualized in the peripancreatic region with the ultrasound probe located 50 cm from the incisors. It measured 10.3 mm by 7.1 mm in maximal cross-sectional diameter. The node was round, hypoechoic and had well defined margins. Fine needle biopsy was performed. Color Doppler imaging was utilized prior to needle puncture to confirm a lack of significant vascular structures within the needle path. Six passes were made with the 25 gauge ultrasound core biopsy needle using a transgastric approach. Visible cores of tissue were obtained. Preliminary cytologic examination and touch preps were performed. Final cytology results are pending. Three enlarged lymph nodes were visualized in the perigastric region with the ultrasound probe located at 41 cm and 45 cm from the incisors. The lymph node at 45.5 cm measured 7.7 mm by 5.4 mm in maximal cross-sectional diameter. The lymph nodes at 41 cm measured 1.8 mm by 3.0 mm and 3.4 mm by 3.0 mm. The nodes were round, hypoechoic and had well defined margins. Sampling was not performed as it would require going through the tumor to attempt to obtain tissue. Endosonographic imaging in the visualized portion of the  liver showed no mass. The celiac region was visualized. EGD Impression: - No gross lesions in proximal/middle esophagus. - Healing esophageal ulceration noted at very distal esophagus. Biopsied. - Malignant gastric tumor at the cardia extending into the gastroesophageal junction. - Erythematous mucosa in the gastric body, incisura and antrum. Biopsied for HP. - Non-bleeding gastric ulcer. Biopsied. - Erythematous duodenopathy. . 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 EUS Impression: - A mass was found in the cardia with extension into the stomach. A tissue diagnosis was obtained prior to this exam. This is previously biopsied as adenocarcinoma. This was staged at least T2 N1 Mx by endosonographic criteria and if the lymph node is returns positive otherwise would - A mass was found in the gastroesophageal junction. The diagnosis is adenocarcinoma. - One malignant-appearing lymph node was visualized in the peripancreatic region. Cytology results are pending. However, the endosonographic appearance is suspicious for metastatic gastric adenocarcinoma. Fine needle biopsy performed. - Three enlarged lymph nodes were visualized in the perigastric region   Biopsy from  04/22/2018  Diagnosis initial biopsy of the stomach mass showed Surgical [P], GE junction-ulcerated lesion - POORLY DIFFERENTIATED ADENOCARCINOMA. - SEE COMMENT. Microscopic Comment The features slightly favor a gastric primary rather than an esophageal primary. Dr. Vicente Males has reviewed the case and concurs with this interpretation. Dr. Fuller Plan was paged on 04/26/2018. (JBK:ah 04/26/18) Enid Cutter MD     BX from 3/23: Diagnosis 1. Stomach, biopsy, Gastric - REACTIVE GASTROPATHY - NO H. PYLORI OR INTESTINAL METAPLASIA IDENTIFIED - SEE COMMENT 2. Stomach, biopsy, Gastric - REACTIVE GASTROPATHY WITH CHRONIC INFLAMMATION - NO H. PYLORI OR INTESTINAL METAPLASIA IDENTIFIED - SEE COMMENT 3. Esophagus, biopsy,  Distal - REACTIVE SQUAMOUS MUCOSA WITH INCREASED INTRAEPITHELIAL LYMPHOCYTES - SEE COMMENT  Diagnosis FINE NEEDLE ASPIRATION, ENDOSCOPIC (A) PERIPANCREATIC LYMPH NODE (SPECIMEN 1 OF 1 COLLECTED 07/22/2018) - NEOPLASTIC CELLS PRESENT - SEE COMMENT There is a monotonous cell population present on the smears and within the cells block. There is no definitive evidence of lymphoid population. By immunohistochemistry, the neoplastic cells are postive for cytokeratin AE1/3, chromogranin and synapotphysin but negative for CD56. CD45 highlights scattered cells. 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  Assessment / Plan:   Patient with gastric carcinoma involving the lesser curve of the stomach to the GE junction-currently under treatment with FOLFOX preoperatively.  Patient had repeat CT scan chest and abdomen on March 30 and   Repeat endoscopy with EUS.  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    Plan to proceed with abdominal exploration by Dr Barry Dienes and possible resection. If needed  for adequate margin will be ready to proceed with Ivor Lewis/right thoracotomy.  The goals risks and alternatives of the planned surgical procedure Procedure(s) with comments: LAPAROSCOPY DIAGNOSTIC (N/A) - AND EPIDURAL ANESTHESIA SUBTOTAL VS TOTAL GASTRECTOMY, FEEDING TUBE PLACEMENT (N/A) - AND EPIDURAL ANESTHESIA POSSIBLE DISTAL PANCREATECTOMY (N/A) - AND EPIDURAL ANESTHESIA Possible Right THORACOTOMY MAJOR (Right)  have been discussed with the patient in detail. The risks of the procedure including death, infection, stroke, myocardial infarction, bleeding, blood transfusion, anastomotic leak  have all been discussed specifically.  I have quoted Tristan Hughes a 5 % of perioperative  mortality and a complication rate as high as 50  %. The patient's questions have been answered.Tristan Hughes is willing  to proceed with the planned procedure.     Grace Isaac MD      [address] [place] [address] Office [phone]   Beeper [phone]  10/01/2018 7:22 AM

## 2018-10-01 NOTE — H&P (Signed)
Tristan Hughes Location: [facility] Surgery Patient #: [id number] DOB: 1940/12/08 Married / Language: English / Race: White Male   History of Present Illness The patient is a 78 year old male who presents with gastric cancer. Pt is a 78 yo M who is referred for Dr. 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 He presented with epigastric pain and around a 20 pound weight loss. He underwent EGD by Dr. Fuller Plan and GE junction/gastric cardia ulcerated mass was found. biopsies were positive for adenocarcinoma, favoring gastric primary. The patient is doing a bit better. he has had 2 cycles of FOLFOX and the third is set up for tomorrow. He is having fatigue and weight loss with chemo, but wasn't having fatigue pre chemo. He has a slight bit of shortness of breath with the anemia related to chemo, but wasn't before.   His mother had pancreatic cancer and uncle had colon cancer.   PET 05/21/2018 IMPRESSION: 1. Hypermetabolic mass primarily in the gastric cardia region extending slightly into the distal esophagus, maximum SUV 11.2, compatible with malignancy. Two small adjacent right gastric lymph nodes are indistinct and not definitively hypermetabolic but could possibly be involved. No findings of metastatic disease to the liver or elsewhere. 2. Prominent physiologic activity in small and large bowel, without CT correlate. 3. No hypermetabolic rib lesion. 4. Other imaging findings of potential clinical significance: Mild thyroid goiter. Aortic Atherosclerosis (ICD10-I70.0). Mild-to-moderate cardiomegaly. Coronary atherosclerosis. Atrophic right infraspinatus muscle. Pleural calcifications, left greater than right. Prominent stool throughout the colon favors constipation. Lower lumbar spondylosis. Old granulomatous disease.  CT chest/abd/pelvis 04/29/2018 IMPRESSION: 1. Irregular wall thickening of the proximal stomach extending from the gastroesophageal junction  into the gastric cardia suspicious for infiltrative neoplasm given provided clinical history. Adjacent prominent lymph nodes, possibly small nodal metastases. No distant metastases identified. 2. No suspicious hepatic findings. 3. Posttraumatic right rib findings with probable incidental lesion involving the posterior aspect of the right 4th rib. No suspicious pulmonary nodules. 4. Evidence of prior granulomatous disease with calcified hilar and upper abdominal lymph nodes and calcified splenic granulomas. 5. Aortic Atherosclerosis (ICD10-I70.0).  EGD 04/22/2018 An infiltrative and ulcerated, non-circumferential, friable mass with no bleeding and no stigmata of recent bleeding involving the lesser curvature of the stomach and the cardia measuring approximately 4 cm x 2 cm. Biopsies were taken with a cold forceps for histology. Findings: - The exam of the stomach was otherwise normal. - One superficial distal esophageal ulcer which was an extention of the ulcerated gastric mass with no bleeding and no stigmata of recent bleeding was found 41 cm from the incisors at the EGJ . - The exam of the esophagus was otherwise normal. - A small non-bleeding diverticulum was found in the second portion of the duodenum. - The exam of the duodenum was otherwise normal.  pathology EGD 12/23/20149 Diagnosis Surgical [P], GE junction-ulcerated lesion - POORLY DIFFERENTIATED ADENOCARCINOMA. - SEE COMMENT. Microscopic Comment The features slightly favor a gastric primary rather than an esophageal primary.   Past Surgical History  Colon Polyp Removal - Colonoscopy  Coronary Artery Bypass Graft  Gallbladder Surgery - Open  Oral Surgery  Tonsillectomy   Diagnostic Studies History Colonoscopy  within last year  Allergies Aleve *ANALGESICS - ANTI-INFLAMMATORY*  Allergies Reconciled   Medication History amLODIPine Besylate (5MG  Tablet, Oral) Active. Aspirin (81MG  Tablet, Oral)  Active. Calcium Carbonate (500MG  Tablet Chewable, Oral) Active. Cholecalciferol (2000UNIT Capsule, Oral) Active. Ezetimibe (10MG  Tablet, Oral) Active. Furosemide (20MG  Tablet,  Oral) Active. HumaLOG (100UNIT/ML Solution, Subcutaneous) Active. Lantus (100UNIT/ML Solution, Subcutaneous) Active. metFORMIN HCl (1000MG  Tablet, Oral) Active. Metoprolol Tartrate (50MG  Tablet, Oral) Active. Rosuvastatin Calcium (20MG  Tablet, Oral) Active. traMADol HCl (50MG  Tablet, Oral) Active. Ondansetron HCl (8MG  Tablet, Oral) Active. Prochlorperazine Maleate (10MG  Tablet, Oral) Active. Tamsulosin HCl (0.4MG  Capsule, Oral) Active. Medications Reconciled  Social History No alcohol use  No caffeine use  No drug use  Tobacco use  Never smoker.  Family History Cerebrovascular Accident  Father, Mother. Diabetes Mellitus  Family Members In General, Mother. Heart Disease  Father. Malignant Neoplasm Of Pancreas  Mother.  Other Problems Cancer  Diabetes Mellitus  Gastric Ulcer  Hemorrhoids  High blood pressure  Hypercholesterolemia  Melanoma  Sleep Apnea     Review of Systems  General Present- Appetite Loss, Fatigue and Weight Loss. Not Present- Chills, Fever, Night Sweats and Weight Gain. Skin Not Present- Change in Wart/Mole, Dryness, Hives, Jaundice, New Lesions, Non-Healing Wounds, Rash and Ulcer. HEENT Present- Wears glasses/contact lenses. Not Present- Earache, Hearing Loss, Hoarseness, Nose Bleed, Oral Ulcers, Ringing in the Ears, Seasonal Allergies, Sinus Pain, Sore Throat, Visual Disturbances and Yellow Eyes. Respiratory Not Present- Bloody sputum, Chronic Cough, Difficulty Breathing, Snoring and Wheezing. Breast Not Present- Breast Mass, Breast Pain, Nipple Discharge and Skin Changes. Cardiovascular Present- Shortness of Breath. Not Present- Chest Pain, Difficulty Breathing Lying Down, Leg Cramps, Palpitations, Rapid Heart Rate and Swelling of  Extremities. Gastrointestinal Present- Abdominal Pain, Excessive gas and Hemorrhoids. Not Present- Bloating, Bloody Stool, Change in Bowel Habits, Chronic diarrhea, Constipation, Difficulty Swallowing, Gets full quickly at meals, Indigestion, Nausea, Rectal Pain and Vomiting. Male Genitourinary Present- Change in Urinary Stream. Not Present- Blood in Urine, Frequency, Impotence, Nocturia, Painful Urination, Urgency and Urine Leakage. Musculoskeletal Not Present- Back Pain, Joint Pain, Joint Stiffness, Muscle Pain, Muscle Weakness and Swelling of Extremities. Neurological Present- Weakness. Not Present- Decreased Memory, Fainting, Headaches, Numbness, Seizures, Tingling, Tremor and Trouble walking. Psychiatric Not Present- Anxiety, Bipolar, Change in Sleep Pattern, Depression, Fearful and Frequent crying. Endocrine Present- Cold Intolerance and Hair Changes. Not Present- Excessive Hunger, Heat Intolerance, Hot flashes and New Diabetes. Hematology Not Present- Blood Thinners, Easy Bruising, Excessive bleeding, Gland problems, HIV and Persistent Infections.  Vitals  Weight: 199.4 lb Height: 70in Body Surface Area: 2.08 m Body Mass Index: 28.61 kg/m  Temp.: 97.80F  Pulse: 75 (Regular)  BP: 148/74 (Sitting, Left Arm, Standard)       Physical Exam General Mental Status-Alert. General Appearance-Consistent with stated age. Hydration-Well hydrated. Voice-Normal.  Head and Neck Head-normocephalic, atraumatic with no lesions or palpable masses. Trachea-midline. Thyroid Gland Characteristics - normal size and consistency.  Eye Eyeball - Bilateral-Extraocular movements intact. Sclera/Conjunctiva - Bilateral-No scleral icterus.  Chest and Lung Exam Chest and lung exam reveals -quiet, even and easy respiratory effort with no use of accessory muscles and on auscultation, normal breath sounds, no adventitious sounds and normal vocal resonance. Inspection Chest  Wall - Normal. Back - normal.  Cardiovascular Cardiovascular examination reveals -normal heart sounds, regular rate and rhythm with no murmurs and normal pedal pulses bilaterally.  Abdomen Inspection Inspection of the abdomen reveals - No Hernias. Palpation/Percussion Palpation and Percussion of the abdomen reveal - Soft, Non Tender, No Rebound tenderness, No Rigidity (guarding) and No hepatosplenomegaly. Auscultation Auscultation of the abdomen reveals - Bowel sounds normal. Note: Right subcostal incision from cholecystectomy   Neurologic Neurologic evaluation reveals -alert and oriented x 3 with no impairment of recent or remote memory. Mental Status-Normal.  Musculoskeletal Global Assessment -Note:  no gross deformities.  Normal Exam - Left-Upper Extremity Strength Normal and Lower Extremity Strength Normal. Normal Exam - Right-Upper Extremity Strength Normal and Lower Extremity Strength Normal.  Lymphatic Head & Neck  General Head & Neck Lymphatics: Bilateral - Description - Normal. Axillary  General Axillary Region: Bilateral - Description - Normal. Tenderness - Non Tender. Femoral & Inguinal  Generalized Femoral & Inguinal Lymphatics: Bilateral - Description - No Generalized lymphadenopathy.    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 Since we have gotten chemo, endoscopic ultrasound may be less useful at this point. Will discuss with Dr. Servando Snare.  Would plan scans around 3-4 weeks after chemo (last chemo tomorrow) then surgery several weeks after that. Discussed dx laparoscopy followed by subtotal vs total gastrectomy with anastmosis to esophagus. Advised patient that feeding tube was absolutely necessary.  Reviewed risks of srugery including bleeding, infection, damage to adjacent structures, heart/lung  complications, wound issues, leak of surgical connection, possible severe reflux, possible recurrent cancer, possible positive margins, possible blood clot, prolonged hospitalization, possibility of never having normal meal size again, and more.  He agrees to proceed. Current Plans Pt Education - [facility] Open Abdominal Surgery [facility] Referred to Thoracic Surgeon (Thoracic Surgeon). Routine. MODERATE PROTEIN MALNUTRITION (E44.0) Impression: Work on nutrition .  discussed protein intake strategies. FAMILY HISTORY OF CANCER (Z80.9) Impression: mother with pancreatic cancer and maternal uncle with colon cancer. Will refer to genetics. Current Plans Referred to Genetic Counseling, for evaluation and follow up (Medical Genetics). Routine.

## 2018-10-01 NOTE — Op Note (Signed)
PRE-OPERATIVE DIAGNOSIS: adenocarcinoma of the gastric cardia, pancreatic neuroendocrine tumor  POST-OPERATIVE DIAGNOSIS: Same  PROCEDURE: Procedure(s): Diagnostic laparoscopy, proximal gastrectomy with esophagogastrostomy, pyloroplasty, jejunostomy tube placement, intraoperative ultrasound, distal pancreatectomy, splenectomy  SURGEON: Surgeon(s): Stark Klein, MD  Assistant:  Armandina Gemma, MD Ceasar Mons, MD  ANESTHESIA: general and  epidural  DRAINS: jejunostomy tube 16 Fr  LOCAL MEDICATIONS USED: LIDOCAINE   SPECIMEN: Source of Specimen:splenic nodule, omentum, proximal stomach/distal esophagus, distal pancreas/spleen  DISPOSITION OF SPECIMEN: PATHOLOGY  COUNTS: YES  DICTATION: .Dragon Dictation  PLAN OF CARE: admit to ICU  PATIENT DISPOSITION: PACU - hemodynamically stable.  FINDINGS: splenic nodules.  Proximal gastric mass, pancreatic tail mass, no evidence of carcinomatosis  EBL: 500 mL  PROCEDURE:  Patient was identified in the holding area and taken to the operating room where (s)he was placed supine on operating room table.  An epidural was placed preoperatively. A Foley catheter was placed. General anesthesia was induced. The abdomen was prepped and draped in sterile fashion. A timeout was performed according to the surgical safety checklist. When all was correct, we continued.  The patient was placed into reverse trendeleberg position and rotated to the right.  A 5 mm optiview trocar was placed under direct visualization.  A second 5 mm trocar was placed in the midline.  No evidence of carcinomatosis was seen.    An upper midline incision was then made with the #10 blade.  The subcutaneous tissues were divided with the cautery and the fascia entered in the midline.  There were adhesions to the abdominal wall from prior open cholecystectomy.  Once these were freed up, splenic nodules were seen.  Frozen section was sent on one of these.   The peritoneum and liver surfaces were palpated and no evidence of metastases was found.    The omentum was freed up from the colon and the colon was tucked out of the way.  The short gastrics were taken down with the harmonic scalpel.  A penrose was placed around the GE junction.  The gastric mass was identified and the omentum freed up in order to create a site for resecting the stomach.  Blue loads of the 55  mm GIA were used to tubularize the stomach.  The TA 30 was used to divide the esophagus.  The specimen was sent for frozen section of the margins.    The pancreatic mass was tentatively identified.  The ultrasound was used to confirm.  The distal pancreas was isolated off the splenic artery and vein.  The TA 30 was used to divide this.  Unfortunately, branches of the vein were bleeding requiring division of the splenic vein.  Therefore, the spleen was included int he resection specimen.  This was also sent for frozen.    Margins were negative.  The proximal tip of the stomach was placed behind the esophageal stump.  Stay sutures were placed on either side of the esophagus. Also, stay sutures were placed on the gastric stump to the posterior esophagus.  The esophagus and stomach were opened.  The GIA was used to create a side to side functional end to end anastamosis.  The NGT was passed across the defect.  3-0 PDS sutures were used to close the defect.    A pyloroplasty was created by opening across the pylorus and closing transversely with interrupted 2-0 silk.     The small bowel was run to identify an appropriate place for gastrojejunostomy.    This was placed in the antecolic  location.  A 3-0 silk was placed proximally and distally at the site of the gastrojejunostomy. The jejunum and the stomach were opened. A 75 mm GIA stapler was used to create a side-to-side functional end-to-end anastomosis. The NGT tube was then advanced through the stomach and placed into the efferent limb of the  jejunum. The pursestring suture was tied down.  Two 3-0 PDS were used to close the anastomotic defect in a running Nickerson fashion.     A site for the jejunostomy tube was identified.  A 2-0 silk pursestring was placed.  The jejunostomy tube was advanced through the abdominal wall and inserted into the jejunum.  The pursestring was tied down.  The tube was witzeled with 3-0 silk sutures.  The tube was then pexed to the abdominal wall with 2-0 silks.  The balloon of the tube was inflated with 1 mL saline to avoid obstructing the bowel.  The flange of the jejunostomy tube was secured with interrupted 2-0 nylon sutures.   Vistaseal was placed on the distal pancreas, pyloroplasty, and esophagogastrostomy.    The abdomen was irrigated and the abdomen was examined to make sure all the laps were removed.  A lap count was performed and was correct as well.  The fascia was then closed using running #1 looped PDS suture. The skin was then irrigated and closed with skin staples. Soft sterile dressings were applied. Needle, sponge, and instrument count were correct 2. The patient was allowed to emerge from anesthesia and taken to the PACU in stable condition

## 2018-10-01 NOTE — Transfer of Care (Signed)
Immediate Anesthesia Transfer of Care Note  Patient: Tristan Hughes  Procedure(s) Performed: LAPAROSCOPY DIAGNOSTIC (N/A Abdomen) JEJUNOSTOMY TUBE PLACEMENT (N/A Abdomen) PYLOROPLASTY  (N/A Abdomen) OPEN PROXIMAL GASTRECTOMY  (Abdomen) OPERATIVE ULTRASOUND (Abdomen)  Patient Location: PACU  Anesthesia Type:GA combined with regional for post-op pain  Level of Consciousness: awake and patient cooperative  Airway & Oxygen Therapy: Patient Spontanous Breathing and Patient connected to nasal cannula oxygen  Post-op Assessment: Report given to RN, Post -op Vital signs reviewed and stable and Patient moving all extremities  Post vital signs: Reviewed and stable  Last Vitals:  Vitals Value Taken Time  BP    Temp    Pulse    Resp    SpO2      Last Pain:  Vitals:   10/01/18 0650  TempSrc:   PainSc: 0-No pain      Patients Stated Pain Goal: 3 (75/79/72 [id number])  Complications: No apparent anesthesia complications

## 2018-10-01 NOTE — Anesthesia Procedure Notes (Signed)
Procedure Name: Intubation Date/Time: 10/01/2018 7:53 AM Performed by: Moshe Salisbury, CRNA Pre-anesthesia Checklist: Patient identified, Emergency Drugs available, Suction available and Patient being monitored Patient Re-evaluated:Patient Re-evaluated prior to induction Oxygen Delivery Method: Circle System Utilized Preoxygenation: Pre-oxygenation with 100% oxygen Induction Type: IV induction Ventilation: Mask ventilation without difficulty and Oral airway inserted - appropriate to patient size Laryngoscope Size: Mac and 4 Grade View: Grade I Tube type: Oral Tube size: 8.0 mm Number of attempts: 1 Airway Equipment and Method: Stylet Placement Confirmation: ETT inserted through vocal cords under direct vision,  positive ETCO2 and breath sounds checked- equal and bilateral Secured at: 22 cm Tube secured with: Tape Dental Injury: Teeth and Oropharynx as per pre-operative assessment

## 2018-10-01 NOTE — Progress Notes (Signed)
Portable and pharmacy have been called and notified that patient needs the PCA/syringe. Will continue to monitor.

## 2018-10-02 ENCOUNTER — Encounter (HOSPITAL_COMMUNITY): Payer: Self-pay | Admitting: General Surgery

## 2018-10-02 LAB — BASIC METABOLIC PANEL
Anion gap: 10 (ref 5–15)
BUN: 16 mg/dL (ref 8–23)
CO2: 24 mmol/L (ref 22–32)
Calcium: 8.2 mg/dL — ABNORMAL LOW (ref 8.9–10.3)
Chloride: 101 mmol/L (ref 98–111)
Creatinine, Ser: 0.99 mg/dL (ref 0.61–1.24)
GFR calc Af Amer: >60 mL/min (ref >60)
GFR calc non Af Amer: >60 mL/min (ref >60)
Glucose, Bld: 216 mg/dL — ABNORMAL HIGH (ref 70–99)
Potassium: 4.5 mmol/L (ref 3.5–5.1)
Sodium: 135 mmol/L (ref 135–145)

## 2018-10-02 LAB — GLUCOSE, CAPILLARY
Glucose-Capillary: 176 mg/dL — ABNORMAL HIGH (ref 70–99)
Glucose-Capillary: 181 mg/dL — ABNORMAL HIGH (ref 70–99)
Glucose-Capillary: 192 mg/dL — ABNORMAL HIGH (ref 70–99)
Glucose-Capillary: 202 mg/dL — ABNORMAL HIGH (ref 70–99)
Glucose-Capillary: 212 mg/dL — ABNORMAL HIGH (ref 70–99)
Glucose-Capillary: 260 mg/dL — ABNORMAL HIGH (ref 70–99)

## 2018-10-02 LAB — CBC
HCT: 26.9 % — ABNORMAL LOW (ref 39.0–52.0)
Hemoglobin: 9.1 g/dL — ABNORMAL LOW (ref 13.0–17.0)
MCH: 30.6 pg (ref 26.0–34.0)
MCHC: 33.8 g/dL (ref 30.0–36.0)
MCV: 90.6 fL (ref 80.0–100.0)
Platelets: 138 10*3/uL — ABNORMAL LOW (ref 150–400)
RBC: 2.97 MIL/uL — ABNORMAL LOW (ref 4.22–5.81)
RDW: 12.2 % (ref 11.5–15.5)
WBC: 19.3 10*3/uL — ABNORMAL HIGH (ref 4.0–10.5)
nRBC: 0 % (ref 0.0–0.2)

## 2018-10-02 MED ORDER — INSULIN GLARGINE 100 UNIT/ML ~~LOC~~ SOLN
10.0000 [IU] | Freq: Every day | SUBCUTANEOUS | Status: DC
  Administered 2018-10-02: 14:00:00 10 [IU] via SUBCUTANEOUS
  Filled 2018-10-02 (×2): qty 0.1

## 2018-10-02 NOTE — Anesthesia Post-op Follow-up Note (Signed)
  Anesthesia Pain Follow-up Note  Patient: Tristan Hughes  Day #: 1  Date of Follow-up: 10/02/2018 Time: 10:26 AM  Last Vitals:  Vitals:   10/02/18 0822 10/02/18 0900  BP:  (!) 127/103  Pulse:  81  Resp: 18 (!) 21  Temp:    SpO2: 96% 94%    Level of Consciousness: alert  Pain: 4 /10   Side Effects:None  Catheter Site Exam:clean, dry, no drainage  Epidural / Intrathecal (From admission, onward)   Start     Dose/Rate Route Frequency Ordered Stop   10/01/18 0900  ropivacaine (PF) 2 mg/mL (0.2%) (NAROPIN) injection     8 mL/hr 8 mL/hr  Epidural Continuous 10/01/18 0859         Plan: Continue current therapy of postop epidural at surgeon's request  Barnet Glasgow

## 2018-10-02 NOTE — OR Nursing (Signed)
Dr. Barry Dienes used Vistaseal 23ml during the procedure on 10/01/2018 for the anastomosis. It was charged to the patient by pharmacy.

## 2018-10-02 NOTE — Progress Notes (Signed)
Called down to Anesthesiology twice due to alarm ringing on epidural pump due to epidural not infusing.  Dr Valma Cava came to bedside to stop alarm and restart epidural as bedside nurse is not able to program epidural pump.  Hiram Gash RN

## 2018-10-02 NOTE — Progress Notes (Signed)
1 Day Post-Op   Subjective/Chief Complaint: No significant complaints.     Objective: Vital signs in last 24 hours: Temp:  [97 F (36.1 C)-98.8 F (37.1 C)] 98.2 F (36.8 C) (06/03 0800) Pulse Rate:  [57-103] 103 (06/03 1100) Resp:  [0-24] 22 (06/03 1000) BP: (81-148)/(47-103) 108/49 (06/03 1100) SpO2:  [94 %-100 %] 94 % (06/03 1100) Arterial Line BP: (97-194)/(37-65) 161/51 (06/03 1100) Last BM Date: (pta)  Intake/Output from previous day: 06/02 0701 - 06/03 0700 In: 4647.6 [I.V.:3899.8; IV Piggyback:574.9] Out: 5643 [Urine:1055; Emesis/NG output:100; Drains:230; Blood:300] Intake/Output this shift: Total I/O In: 147.5 [I.V.:131.5; Other:16] Out: -   General appearance: alert, cooperative and no distress Resp: breathing comfortably Cardio: regular rate and rhythm GI: soft, non distended, approp tender.  drain serosang.   Extremities: extremities normal, atraumatic, no cyanosis or edema  Lab Results:  Recent Labs    10/01/18 1247 10/02/18 0653  WBC  --  19.3*  HGB 9.9* 9.1*  HCT 29.0* 26.9*  PLT  --  138*   BMET Recent Labs    10/01/18 1247 10/02/18 0653  NA 138 135  K 4.0 4.5  CL  --  101  CO2  --  24  GLUCOSE 206* 216*  BUN  --  16  CREATININE  --  0.99  CALCIUM  --  8.2*   PT/INR No results for input(s): LABPROT, INR in the last 72 hours. ABG Recent Labs    10/01/18 1126  PHART 7.419  HCO3 25.9    Studies/Results: No results found.  Anti-infectives: Anti-infectives (From admission, onward)   Start     Dose/Rate Route Frequency Ordered Stop   10/01/18 1600  ceFAZolin (ANCEF) IVPB 2g/100 mL premix     2 g 200 mL/hr over 30 Minutes Intravenous Every 8 hours 10/01/18 1539 10/01/18 1759   10/01/18 0631  ceFAZolin (ANCEF) 2-4 GM/100ML-% IVPB    Note to Pharmacy:  Marga Melnick   : cabinet override      10/01/18 0631 10/01/18 0814   10/01/18 0630  ceFAZolin (ANCEF) IVPB 2g/100 mL premix     2 g 200 mL/hr over 30 Minutes Intravenous On call  to O.R. 10/01/18 3295 10/01/18 1884      Assessment/Plan: s/p Procedure(s): LAPAROSCOPY DIAGNOSTIC (N/A) JEJUNOSTOMY TUBE PLACEMENT (N/A) PYLOROPLASTY  (N/A) OPEN PROXIMAL GASTRECTOMY  OPERATIVE ULTRASOUND Continue foley due to urinary output monitoring and epidural in place.  nutrition consult - plan to start trophic feeds tomorrow.    Repeat another 24 hours of tylenol.  Add lantus for DM. Plan upper GI on POD 5 to assess for leak before removal of NGT or any po meds.  Ok for LIQUID MEDS ONLY via J tube.  No crushed meds.   Metoprolol for HTN.   Moderate protein calorie malnutrition.   LOS: 1 day    Stark Klein 10/02/2018

## 2018-10-03 LAB — BASIC METABOLIC PANEL
Anion gap: 9 (ref 5–15)
BUN: 15 mg/dL (ref 8–23)
CO2: 23 mmol/L (ref 22–32)
Calcium: 8.1 mg/dL — ABNORMAL LOW (ref 8.9–10.3)
Chloride: 104 mmol/L (ref 98–111)
Creatinine, Ser: 1.25 mg/dL — ABNORMAL HIGH (ref 0.61–1.24)
GFR calc Af Amer: >60 mL/min (ref >60)
GFR calc non Af Amer: 55 mL/min — ABNORMAL LOW (ref >60)
Glucose, Bld: 266 mg/dL — ABNORMAL HIGH (ref 70–99)
Potassium: 4.8 mmol/L (ref 3.5–5.1)
Sodium: 136 mmol/L (ref 135–145)

## 2018-10-03 LAB — CBC
HCT: 24.4 % — ABNORMAL LOW (ref 39.0–52.0)
Hemoglobin: 8.1 g/dL — ABNORMAL LOW (ref 13.0–17.0)
MCH: 30.9 pg (ref 26.0–34.0)
MCHC: 33.2 g/dL (ref 30.0–36.0)
MCV: 93.1 fL (ref 80.0–100.0)
Platelets: 136 10*3/uL — ABNORMAL LOW (ref 150–400)
RBC: 2.62 MIL/uL — ABNORMAL LOW (ref 4.22–5.81)
RDW: 12.6 % (ref 11.5–15.5)
WBC: 22.6 10*3/uL — ABNORMAL HIGH (ref 4.0–10.5)
nRBC: 0 % (ref 0.0–0.2)

## 2018-10-03 LAB — GLUCOSE, CAPILLARY
Glucose-Capillary: 218 mg/dL — ABNORMAL HIGH (ref 70–99)
Glucose-Capillary: 230 mg/dL — ABNORMAL HIGH (ref 70–99)
Glucose-Capillary: 201 mg/dL — ABNORMAL HIGH (ref 70–99)
Glucose-Capillary: 178 mg/dL — ABNORMAL HIGH (ref 70–99)
Glucose-Capillary: 172 mg/dL — ABNORMAL HIGH (ref 70–99)
Glucose-Capillary: 176 mg/dL — ABNORMAL HIGH (ref 70–99)

## 2018-10-03 LAB — PHOSPHORUS: Phosphorus: 1.2 mg/dL — ABNORMAL LOW (ref 2.5–4.6)

## 2018-10-03 LAB — MAGNESIUM: Magnesium: 1.6 mg/dL — ABNORMAL LOW (ref 1.7–2.4)

## 2018-10-03 MED ORDER — ALBUMIN HUMAN 25 % IV SOLN
12.5000 g | Freq: Four times a day (QID) | INTRAVENOUS | Status: AC
  Administered 2018-10-03 – 2018-10-05 (×8): 12.5 g via INTRAVENOUS
  Filled 2018-10-03 (×8): qty 50

## 2018-10-03 MED ORDER — PIVOT 1.5 CAL PO LIQD
1000.0000 mL | ORAL | Status: DC
  Administered 2018-10-03: 14:00:00 1000 mL

## 2018-10-03 MED ORDER — INSULIN GLARGINE 100 UNIT/ML ~~LOC~~ SOLN
16.0000 [IU] | Freq: Every day | SUBCUTANEOUS | Status: DC
  Administered 2018-10-03: 11:00:00 16 [IU] via SUBCUTANEOUS
  Filled 2018-10-03 (×2): qty 0.16

## 2018-10-03 NOTE — Addendum Note (Signed)
Addendum  created 10/03/18 1618 by Audry Pili, MD   Clinical Note Signed

## 2018-10-03 NOTE — Anesthesia Postprocedure Evaluation (Signed)
Anesthesia Post Note  Patient: Tristan Hughes  Procedure(s) Performed: LAPAROSCOPY DIAGNOSTIC (N/A Abdomen) JEJUNOSTOMY TUBE PLACEMENT (N/A Abdomen) PYLOROPLASTY  (N/A Abdomen) OPEN PROXIMAL GASTRECTOMY  (Abdomen) OPERATIVE ULTRASOUND (Abdomen)     Patient location during evaluation: PACU Anesthesia Type: Epidural Level of consciousness: awake and alert Pain management: pain level controlled Vital Signs Assessment: post-procedure vital signs reviewed and stable Respiratory status: spontaneous breathing, nonlabored ventilation, respiratory function stable and patient connected to nasal cannula oxygen Cardiovascular status: blood pressure returned to baseline and stable Postop Assessment: no apparent nausea or vomiting Anesthetic complications: no    Last Vitals:  Vitals:   10/03/18 0716 10/03/18 0720  BP:    Pulse:    Resp: 17 17  Temp:    SpO2: 93% 93%    Last Pain:  Vitals:   10/03/18 0720  TempSrc:   PainSc: 4                  Zakirah Weingart

## 2018-10-03 NOTE — Progress Notes (Signed)
2 Days Post-Op   Subjective/Chief Complaint: Had brief episode of hypotension and tachycardia around midnight. He was unaware of this.  Pain tolerable.  Biggest complaint is being woken up.     Objective: Vital signs in last 24 hours: Temp:  [98.1 F (36.7 C)-99.5 F (37.5 C)] 98.8 F (37.1 C) (06/04 0400) Pulse Rate:  [79-123] 93 (06/04 0700) Resp:  [12-28] 17 (06/04 0720) BP: (74-162)/(49-103) 102/60 (06/04 0700) SpO2:  [92 %-99 %] 93 % (06/04 0720) Arterial Line BP: (142-194)/(50-62) 155/60 (06/03 1300) Last BM Date: (PTA)  Intake/Output from previous day: 06/03 0701 - 06/04 0700 In: 2210 [I.V.:1857.6; IV Piggyback:150.4] Out: 1405 [Urine:1000; Emesis/NG output:300; Drains:105] Intake/Output this shift: No intake/output data recorded.    General appearance: alert, cooperative and no distress Resp: breathing comfortably Cardio: regular rate and rhythm GI: soft, non distended, approp tender.  drain serosang.   Extremities: extremities normal, atraumatic, no cyanosis or edema  Lab Results:  Recent Labs    10/02/18 0653 10/03/18 0553  WBC 19.3* 22.6*  HGB 9.1* 8.1*  HCT 26.9* 24.4*  PLT 138* 136*   BMET Recent Labs    10/02/18 0653 10/03/18 0553  NA 135 136  K 4.5 4.8  CL 101 104  CO2 24 23  GLUCOSE 216* 266*  BUN 16 15  CREATININE 0.99 1.25*  CALCIUM 8.2* 8.1*   PT/INR No results for input(s): LABPROT, INR in the last 72 hours. ABG Recent Labs    10/01/18 1126  PHART 7.419  HCO3 25.9    Studies/Results: No results found.  Anti-infectives: Anti-infectives (From admission, onward)   Start     Dose/Rate Route Frequency Ordered Stop   10/01/18 1600  ceFAZolin (ANCEF) IVPB 2g/100 mL premix     2 g 200 mL/hr over 30 Minutes Intravenous Every 8 hours 10/01/18 1539 10/01/18 1759   10/01/18 0631  ceFAZolin (ANCEF) 2-4 GM/100ML-% IVPB    Note to Pharmacy:  Marga Melnick   : cabinet override      10/01/18 0631 10/01/18 0814   10/01/18 0630   ceFAZolin (ANCEF) IVPB 2g/100 mL premix     2 g 200 mL/hr over 30 Minutes Intravenous On call to O.R. 10/01/18 4431 10/01/18 5400      Assessment/Plan: s/p Procedure(s): LAPAROSCOPY DIAGNOSTIC (N/A) JEJUNOSTOMY TUBE PLACEMENT (N/A) PYLOROPLASTY  (N/A) OPEN PROXIMAL GASTRECTOMY  OPERATIVE ULTRASOUND Continue foley due to urinary output monitoring and epidural in place.  nutrition consult - plan to start trophic feeds today Increase lantus for DM. Plan upper GI on POD 5 to assess for leak before removal of NGT or any po meds. Albumin for volume support.    Ok for LIQUID MEDS ONLY via J tube.  No crushed meds.   Metoprolol for HTN.   Moderate protein calorie malnutrition.   LOS: 2 days    Tristan Hughes 10/03/2018

## 2018-10-03 NOTE — Progress Notes (Addendum)
Initial Nutrition Assessment  DOCUMENTATION CODES:   Not applicable  INTERVENTION:   Pivot 1.5 @ 10 ml/hr  Provides: 360 kcal, 22 grams protein, and 182 ml free water.   As able recommend increasing TF to goal of 65 ml/hr Provides: 2340 kcal, 146 grams protein, and 1184 ml free water.   NUTRITION DIAGNOSIS:   Increased nutrient needs related to cancer and cancer related treatments as evidenced by estimated needs.  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    NG tube in place, plan for upper GI on POD 5 J tube in place, consult received to start trickle TF today.   Per outpatient oncology RD usual weight is 229 lb, pt was having weight loss PTA due to poor appetite and intolerance to solids. Per chart review pt with 9% weight loss x 6 months. It does appear that weight dropped as low as 194 lb but is back up to 208 lb.   Medications reviewed and include: SSI, 16 units lantus daily D5 1/2 NS with 20 mEq KCl  Labs reviewed: [phone]    NUTRITION - FOCUSED PHYSICAL EXAM:  Deferred   Diet Order:   Diet Order            Diet NPO time specified  Diet effective now              EDUCATION NEEDS:   No education needs have been identified at this time  Skin:  Skin Assessment: Reviewed RN Assessment  Last BM:  unknown  Height:   Ht Readings from Last 1 Encounters:  10/01/18 5\' 9"  (1.753 m)    Weight:   Wt Readings from Last 1 Encounters:  10/01/18 94.8 kg    Ideal Body Weight:  72.7 kg  BMI:  Body mass  index is 30.85 kg/m.  Estimated Nutritional Needs:   Kcal:  2300-2500  Protein:  120-135 grams  Fluid:  >2 L/day  Maylon Peppers RD, LDN, CNSC [phone] Pager [phone] After Hours Pager

## 2018-10-03 NOTE — Anesthesia Post-op Follow-up Note (Signed)
  Anesthesia Pain Follow-up Note  Patient: Tristan Hughes  Post Op Day #: 2  Date of Follow-up: 10/03/2018 Time: 4:17 PM  Last Vitals:  Vitals:   10/03/18 1300 10/03/18 1400  BP: (!) 121/56 126/68  Pulse: (!) 108 (!) 114  Resp: (!) 26 (!) 26  Temp:    SpO2: (!) 88% (!) 88%    Level of Consciousness: alert  Pain: 5 /10   Side Effects:None  Catheter Site Exam: Clean, dry, intact. Non-tender, non-indurated.  Epidural / Intrathecal (From admission, onward)   Start     Dose/Rate Route Frequency Ordered Stop   10/01/18 0900  ropivacaine (PF) 2 mg/mL (0.2%) (NAROPIN) injection     8 mL/hr 8 mL/hr  Epidural Continuous 10/01/18 0859         Plan: Continue current therapy of postop epidural at surgeon's request  Audry Pili

## 2018-10-04 ENCOUNTER — Encounter (HOSPITAL_COMMUNITY): Payer: Self-pay | Admitting: Anesthesiology

## 2018-10-04 LAB — CBC WITH DIFFERENTIAL/PLATELET
Abs Immature Granulocytes: 0.17 10*3/uL — ABNORMAL HIGH (ref 0.00–0.07)
Basophils Absolute: 0 10*3/uL (ref 0.0–0.1)
Basophils Relative: 0 %
Eosinophils Absolute: 0 10*3/uL (ref 0.0–0.5)
Eosinophils Relative: 0 %
HCT: 19 % — ABNORMAL LOW (ref 39.0–52.0)
Hemoglobin: 6.3 g/dL — CL (ref 13.0–17.0)
Immature Granulocytes: 1 %
Lymphocytes Relative: 4 %
Lymphs Abs: 0.9 10*3/uL (ref 0.7–4.0)
MCH: 30.7 pg (ref 26.0–34.0)
MCHC: 33.2 g/dL (ref 30.0–36.0)
MCV: 92.7 fL (ref 80.0–100.0)
Monocytes Absolute: 1.7 10*3/uL — ABNORMAL HIGH (ref 0.1–1.0)
Monocytes Relative: 8 %
Neutro Abs: 20.1 10*3/uL — ABNORMAL HIGH (ref 1.7–7.7)
Neutrophils Relative %: 87 %
Platelets: 139 10*3/uL — ABNORMAL LOW (ref 150–400)
RBC: 2.05 MIL/uL — ABNORMAL LOW (ref 4.22–5.81)
RDW: 13 % (ref 11.5–15.5)
WBC: 23 10*3/uL — ABNORMAL HIGH (ref 4.0–10.5)
nRBC: 0.1 % (ref 0.0–0.2)

## 2018-10-04 LAB — PREPARE RBC (CROSSMATCH)

## 2018-10-04 LAB — CBC
HCT: 19.6 % — ABNORMAL LOW (ref 39.0–52.0)
Hemoglobin: 6.5 g/dL — CL (ref 13.0–17.0)
MCH: 30.8 pg (ref 26.0–34.0)
MCHC: 33.2 g/dL (ref 30.0–36.0)
MCV: 92.9 fL (ref 80.0–100.0)
Platelets: 141 10*3/uL — ABNORMAL LOW (ref 150–400)
RBC: 2.11 MIL/uL — ABNORMAL LOW (ref 4.22–5.81)
RDW: 12.8 % (ref 11.5–15.5)
WBC: 21.3 10*3/uL — ABNORMAL HIGH (ref 4.0–10.5)
nRBC: 0 % (ref 0.0–0.2)

## 2018-10-04 LAB — BASIC METABOLIC PANEL
Anion gap: 7 (ref 5–15)
BUN: 12 mg/dL (ref 8–23)
CO2: 26 mmol/L (ref 22–32)
Calcium: 8.2 mg/dL — ABNORMAL LOW (ref 8.9–10.3)
Chloride: 105 mmol/L (ref 98–111)
Creatinine, Ser: 1.08 mg/dL (ref 0.61–1.24)
GFR calc Af Amer: >60 mL/min (ref >60)
GFR calc non Af Amer: >60 mL/min (ref >60)
Glucose, Bld: 208 mg/dL — ABNORMAL HIGH (ref 70–99)
Potassium: 4.1 mmol/L (ref 3.5–5.1)
Sodium: 138 mmol/L (ref 135–145)

## 2018-10-04 LAB — PHOSPHORUS
Phosphorus: 1.3 mg/dL — ABNORMAL LOW (ref 2.5–4.6)
Phosphorus: 2.2 mg/dL — ABNORMAL LOW (ref 2.5–4.6)

## 2018-10-04 LAB — GLUCOSE, CAPILLARY
Glucose-Capillary: 200 mg/dL — ABNORMAL HIGH (ref 70–99)
Glucose-Capillary: 194 mg/dL — ABNORMAL HIGH (ref 70–99)
Glucose-Capillary: 222 mg/dL — ABNORMAL HIGH (ref 70–99)
Glucose-Capillary: 242 mg/dL — ABNORMAL HIGH (ref 70–99)
Glucose-Capillary: 187 mg/dL — ABNORMAL HIGH (ref 70–99)
Glucose-Capillary: 179 mg/dL — ABNORMAL HIGH (ref 70–99)

## 2018-10-04 LAB — MAGNESIUM
Magnesium: 1.8 mg/dL (ref 1.7–2.4)
Magnesium: 1.8 mg/dL (ref 1.7–2.4)

## 2018-10-04 LAB — HEMOGLOBIN AND HEMATOCRIT, BLOOD
HCT: 22 % — ABNORMAL LOW (ref 39.0–52.0)
Hemoglobin: 7.4 g/dL — ABNORMAL LOW (ref 13.0–17.0)

## 2018-10-04 MED ORDER — INSULIN GLARGINE 100 UNIT/ML ~~LOC~~ SOLN
22.0000 [IU] | Freq: Every day | SUBCUTANEOUS | Status: DC
  Administered 2018-10-04 – 2018-10-05 (×2): 22 [IU] via SUBCUTANEOUS
  Filled 2018-10-04 (×2): qty 0.22

## 2018-10-04 MED ORDER — SODIUM CHLORIDE 0.9% FLUSH
10.0000 mL | Freq: Two times a day (BID) | INTRAVENOUS | Status: DC
  Administered 2018-10-04 – 2018-10-05 (×3): 10 mL
  Administered 2018-10-05: 22:00:00 30 mL
  Administered 2018-10-06 – 2018-10-19 (×17): 10 mL
  Administered 2018-10-19: 21:00:00 30 mL
  Administered 2018-10-20: 20 mL
  Administered 2018-10-20 – 2018-10-21 (×2): 10 mL
  Administered 2018-10-22 – 2018-10-23 (×2): 20 mL
  Administered 2018-10-24 – 2018-10-25 (×2): 10 mL

## 2018-10-04 MED ORDER — SODIUM CHLORIDE 0.9% IV SOLUTION: Freq: Once | INTRAVENOUS | Status: DC

## 2018-10-04 MED ORDER — PIVOT 1.5 CAL PO LIQD: 1000.0000 mL | ORAL | Status: DC

## 2018-10-04 MED ORDER — SODIUM CHLORIDE 0.9% FLUSH
10.0000 mL | INTRAVENOUS | Status: DC | PRN
  Administered 2018-10-22: 18:00:00 10 mL
  Filled 2018-10-04: qty 40

## 2018-10-04 MED ORDER — POTASSIUM PHOSPHATES 15 MMOLE/5ML IV SOLN
30.0000 mmol | Freq: Once | INTRAVENOUS | Status: AC
  Administered 2018-10-04: 30 mmol via INTRAVENOUS
  Filled 2018-10-04: qty 10

## 2018-10-04 MED ORDER — CHLORHEXIDINE GLUCONATE CLOTH 2 % EX PADS
6.0000 | MEDICATED_PAD | Freq: Every day | CUTANEOUS | Status: DC
  Administered 2018-10-04 – 2018-10-17 (×14): 6 via TOPICAL

## 2018-10-04 NOTE — Progress Notes (Signed)
3 Days Post-Op   Subjective/Chief Complaint: HCT drifted down.  Getting 1 u prbcs today.  Pain 5/10 today.  CBGs still in 200s.  No flatus yet.  Objective: Vital signs in last 24 hours: Temp:  [98.3 F (36.8 C)-99.1 F (37.3 C)] 98.3 F (36.8 C) (06/05 1005) Pulse Rate:  [54-114] 99 (06/05 1005) Resp:  [20-33] 33 (06/05 1005) BP: (116-137)/(42-78) 119/53 (06/05 1005) SpO2:  [85 %-96 %] 94 % (06/05 1005) Weight:  [97 kg] 97 kg (06/05 0500) Last BM Date: (PTA)  Intake/Output from previous day: 06/04 0701 - 06/05 0700 In: 1179.5 [I.V.:871.3; NG/GT:45; IV Piggyback:125.2] Out: 1390 [Urine:1100; Emesis/NG output:250; Drains:40] Intake/Output this shift: Total I/O In: 400 [I.V.:50; Blood:350] Out: 175 [Urine:175]   General appearance: alert, cooperative and no distress Resp: breathing comfortably Cardio: regular rate and rhythm GI: soft, non distended, approp tender.  drain serosang.   Extremities: extremities normal, atraumatic, no cyanosis or edema  Lab Results:  Recent Labs    10/04/18 0521 10/04/18 0732  WBC 21.3* 23.0*  HGB 6.5* 6.3*  HCT 19.6* 19.0*  PLT 141* 139*   BMET Recent Labs    10/03/18 0553 10/04/18 0521  NA 136 138  K 4.8 4.1  CL 104 105  CO2 23 26  GLUCOSE 266* 208*  BUN 15 12  CREATININE 1.25* 1.08  CALCIUM 8.1* 8.2*   PT/INR No results for input(s): LABPROT, INR in the last 72 hours. ABG Recent Labs    10/01/18 1126  PHART 7.419  HCO3 25.9    Studies/Results: No results found.  Anti-infectives: Anti-infectives (From admission, onward)   Start     Dose/Rate Route Frequency Ordered Stop   10/01/18 1600  ceFAZolin (ANCEF) IVPB 2g/100 mL premix     2 g 200 mL/hr over 30 Minutes Intravenous Every 8 hours 10/01/18 1539 10/01/18 1759   10/01/18 0631  ceFAZolin (ANCEF) 2-4 GM/100ML-% IVPB    Note to Pharmacy:  Marga Melnick   : cabinet override      10/01/18 0631 10/01/18 0814   10/01/18 0630  ceFAZolin (ANCEF) IVPB 2g/100 mL premix      2 g 200 mL/hr over 30 Minutes Intravenous On call to O.R. 10/01/18 4709 10/01/18 6283      Assessment/Plan: s/p Procedure(s): LAPAROSCOPY DIAGNOSTIC (N/A) JEJUNOSTOMY TUBE PLACEMENT (N/A) PYLOROPLASTY  (N/A) OPEN PROXIMAL GASTRECTOMY  OPERATIVE ULTRASOUND  ABL anemia - getting 1 u blood today.  Recheck in AM. Continue foley due to urinary output monitoring and epidural in place.  nutrition consult - increase tube feeds to 20 ml/hr Increase lantus for DM. Plan upper GI on POD 5 (Sunday June 7) to assess for leak before removal of NGT or any po meds. Albumin for volume support for 48 hours.Madaline Brilliant for LIQUID MEDS ONLY via J tube.  No crushed meds.   Metoprolol for HTN.   Moderate protein calorie malnutrition.   LOS: 3 days    Stark Klein 10/04/2018

## 2018-10-04 NOTE — Anesthesia Post-op Follow-up Note (Signed)
  Anesthesia Pain Follow-up Note  Patient: ARUSH GATLIFF  Day #: 3  Date of Follow-up: 10/04/2018 Time: 10:25 AM  Last Vitals:  Vitals:   10/04/18 0900 10/04/18 1005  BP: (!) 124/42 (!) 119/53  Pulse: 93 99  Resp: (!) 29 (!) 33  Temp:  36.8 C  SpO2: 95% 94%    Level of Consciousness: alert  Pain: moderate   Side Effects:None  Catheter Site Exam:clean  Epidural / Intrathecal (From admission, onward)   Start     Dose/Rate Route Frequency Ordered Stop   10/01/18 0900  ropivacaine (PF) 2 mg/mL (0.2%) (NAROPIN) injection     8 mL/hr 8 mL/hr  Epidural Continuous 10/01/18 0859         Plan: Continue current therapy of postop epidural at surgeon's request  Lynda Rainwater

## 2018-10-05 LAB — CBC
HCT: 21.7 % — ABNORMAL LOW (ref 39.0–52.0)
Hemoglobin: 7.1 g/dL — ABNORMAL LOW (ref 13.0–17.0)
MCH: 29.7 pg (ref 26.0–34.0)
MCHC: 32.7 g/dL (ref 30.0–36.0)
MCV: 90.8 fL (ref 80.0–100.0)
Platelets: 160 10*3/uL (ref 150–400)
RBC: 2.39 MIL/uL — ABNORMAL LOW (ref 4.22–5.81)
RDW: 14.5 % (ref 11.5–15.5)
WBC: 19.1 10*3/uL — ABNORMAL HIGH (ref 4.0–10.5)
nRBC: 0.2 % (ref 0.0–0.2)

## 2018-10-05 LAB — GLUCOSE, CAPILLARY
Glucose-Capillary: 174 mg/dL — ABNORMAL HIGH (ref 70–99)
Glucose-Capillary: 191 mg/dL — ABNORMAL HIGH (ref 70–99)
Glucose-Capillary: 209 mg/dL — ABNORMAL HIGH (ref 70–99)
Glucose-Capillary: 211 mg/dL — ABNORMAL HIGH (ref 70–99)
Glucose-Capillary: 216 mg/dL — ABNORMAL HIGH (ref 70–99)
Glucose-Capillary: 231 mg/dL — ABNORMAL HIGH (ref 70–99)

## 2018-10-05 LAB — BASIC METABOLIC PANEL
Anion gap: 9 (ref 5–15)
BUN: 12 mg/dL (ref 8–23)
CO2: 26 mmol/L (ref 22–32)
Calcium: 8.2 mg/dL — ABNORMAL LOW (ref 8.9–10.3)
Chloride: 107 mmol/L (ref 98–111)
Creatinine, Ser: 0.87 mg/dL (ref 0.61–1.24)
GFR calc Af Amer: >60 mL/min (ref >60)
GFR calc non Af Amer: >60 mL/min (ref >60)
Glucose, Bld: 205 mg/dL — ABNORMAL HIGH (ref 70–99)
Potassium: 3.8 mmol/L (ref 3.5–5.1)
Sodium: 142 mmol/L (ref 135–145)

## 2018-10-05 LAB — BPAM RBC
Blood Product Expiration Date: 202006192359
ISSUE DATE / TIME: 202006050759
Unit Type and Rh: 6200

## 2018-10-05 LAB — TYPE AND SCREEN
ABO/RH(D): AB POS
Antibody Screen: NEGATIVE
Unit division: 0

## 2018-10-05 LAB — MAGNESIUM: Magnesium: 1.8 mg/dL (ref 1.7–2.4)

## 2018-10-05 LAB — PHOSPHORUS: Phosphorus: 1.8 mg/dL — ABNORMAL LOW (ref 2.5–4.6)

## 2018-10-05 MED ORDER — PANTOPRAZOLE SODIUM 40 MG PO PACK
40.0000 mg | PACK | Freq: Every day | ORAL | Status: DC
  Administered 2018-10-05 – 2018-10-15 (×11): 40 mg
  Filled 2018-10-05 (×11): qty 20

## 2018-10-05 MED ORDER — PIVOT 1.5 CAL PO LIQD
1000.0000 mL | ORAL | Status: DC
  Administered 2018-10-05: 1000 mL

## 2018-10-05 MED ORDER — ACETAMINOPHEN 160 MG/5ML PO SOLN
650.0000 mg | Freq: Four times a day (QID) | ORAL | Status: DC | PRN
  Filled 2018-10-05: qty 20.3

## 2018-10-05 MED ORDER — OXYCODONE HCL 5 MG/5ML PO SOLN
5.0000 mg | Freq: Four times a day (QID) | ORAL | Status: DC | PRN
  Administered 2018-10-07 – 2018-10-08 (×3): 5 mg
  Filled 2018-10-05 (×3): qty 5

## 2018-10-05 MED ORDER — METOPROLOL TARTRATE 25 MG/10 ML ORAL SUSPENSION
12.5000 mg | Freq: Two times a day (BID) | ORAL | Status: DC
  Filled 2018-10-05: qty 10

## 2018-10-05 MED ORDER — DOCUSATE SODIUM 50 MG/5ML PO LIQD
100.0000 mg | Freq: Every day | ORAL | Status: DC
  Administered 2018-10-05 – 2018-10-15 (×10): 100 mg
  Filled 2018-10-05 (×10): qty 10

## 2018-10-05 MED ORDER — LORAZEPAM 2 MG/ML IJ SOLN
0.5000 mg | Freq: Three times a day (TID) | INTRAMUSCULAR | Status: DC | PRN
  Administered 2018-10-08: 0.5 mg via INTRAVENOUS
  Filled 2018-10-05: qty 1

## 2018-10-05 MED ORDER — METOPROLOL TARTRATE 25 MG/10 ML ORAL SUSPENSION
12.5000 mg | Freq: Two times a day (BID) | ORAL | Status: DC
  Administered 2018-10-05 – 2018-10-15 (×22): 12.5 mg
  Filled 2018-10-05 (×2): qty 5
  Filled 2018-10-05 (×2): qty 10
  Filled 2018-10-05 (×2): qty 5
  Filled 2018-10-05: qty 10
  Filled 2018-10-05: qty 5
  Filled 2018-10-05 (×2): qty 10
  Filled 2018-10-05: qty 5
  Filled 2018-10-05: qty 10
  Filled 2018-10-05 (×4): qty 5
  Filled 2018-10-05 (×2): qty 10
  Filled 2018-10-05 (×5): qty 5

## 2018-10-05 MED ORDER — ACETAMINOPHEN 160 MG/5ML PO SOLN
650.0000 mg | Freq: Four times a day (QID) | ORAL | Status: DC | PRN
  Administered 2018-10-05 – 2018-10-16 (×6): 650 mg
  Filled 2018-10-05 (×5): qty 20.3

## 2018-10-05 MED ORDER — INSULIN GLARGINE 100 UNIT/ML ~~LOC~~ SOLN
26.0000 [IU] | Freq: Every day | SUBCUTANEOUS | Status: DC
  Administered 2018-10-06: 26 [IU] via SUBCUTANEOUS
  Filled 2018-10-05 (×2): qty 0.26

## 2018-10-05 NOTE — Progress Notes (Signed)
4 Days Post-Op   Subjective/Chief Complaint: Got 1u prbc yesterday. Nurse reports pt a little anxious at times States pain ok No flatus  Objective: Vital signs in last 24 hours: Temp:  [98.3 F (36.8 C)-99.3 F (37.4 C)] 99.3 F (37.4 C) (06/06 0800) Pulse Rate:  [84-103] 94 (06/06 0900) Resp:  [13-33] 19 (06/06 0921) BP: (119-150)/(48-77) 144/73 (06/06 0900) SpO2:  [86 %-98 %] 96 % (06/06 0921) Weight:  [95.7 kg] 95.7 kg (06/06 0500) Last BM Date: (PTA)  Intake/Output from previous day: 06/05 0701 - 06/06 0700 In: 3782.5 [I.V.:2200.1; Blood:350; NG/GT:290; IV Piggyback:710.5] Out: 1410 [Urine:1075; Emesis/NG output:305; Drains:30] Intake/Output this shift: Total I/O In: -  Out: 150 [Urine:150]   General appearance: alert, cooperative and no distress Resp: breathing comfortably Cardio: regular rate and rhythm GI: soft, mild distended, approp tender.  drain serosang.   Extremities: extremities normal, atraumatic, no cyanosis or edema  Lab Results:  Recent Labs    10/04/18 0732 10/04/18 1626 10/05/18 0503  WBC 23.0*  --  19.1*  HGB 6.3* 7.4* 7.1*  HCT 19.0* 22.0* 21.7*  PLT 139*  --  160   BMET Recent Labs    10/04/18 0521 10/05/18 0503  NA 138 142  K 4.1 3.8  CL 105 107  CO2 26 26  GLUCOSE 208* 205*  BUN 12 12  CREATININE 1.08 0.87  CALCIUM 8.2* 8.2*   PT/INR No results for input(s): LABPROT, INR in the last 72 hours. ABG No results for input(s): PHART, HCO3 in the last 72 hours.  Invalid input(s): PCO2, PO2  Studies/Results: No results found.  Anti-infectives: Anti-infectives (From admission, onward)   Start     Dose/Rate Route Frequency Ordered Stop   10/01/18 1600  ceFAZolin (ANCEF) IVPB 2g/100 mL premix     2 g 200 mL/hr over 30 Minutes Intravenous Every 8 hours 10/01/18 1539 10/01/18 1759   10/01/18 0631  ceFAZolin (ANCEF) 2-4 GM/100ML-% IVPB    Note to Pharmacy:  Marga Melnick   : cabinet override      10/01/18 0631 10/01/18 0814   10/01/18 0630  ceFAZolin (ANCEF) IVPB 2g/100 mL premix     2 g 200 mL/hr over 30 Minutes Intravenous On call to O.R. 10/01/18 0998 10/01/18 3382      Assessment/Plan: s/p Procedure(s): LAPAROSCOPY DIAGNOSTIC (N/A) JEJUNOSTOMY TUBE PLACEMENT (N/A) PYLOROPLASTY  (N/A) OPEN PROXIMAL GASTRECTOMY  OPERATIVE ULTRASOUND  ABL anemia - got 1 u 6/5. Stable this am.   Recheck in AM. Continue foley due to urinary output monitoring and epidural in place.  nutrition consult - increase tube feeds to 30 ml/hr (goal is 20ml/hr) Increase lantus for DM. Plan upper GI on POD 5 (Sunday June 7) to assess for leak before removal of NGT or any po meds. Albumin for volume support for 48 hours.Madaline Brilliant for LIQUID MEDS ONLY via J tube.  No crushed meds.   Restart low dose Metoprolol for HTN/HR via J tube   Moderate protein calorie malnutrition. OOB, pulm toilet  Plan for epidural to come out on Sunday along with foley, start lovenox (if hgb stable and epidural out) Start oxycodone via J tube prn for addl pain control Low dose ativan PRN for anxiety Gave wife update via phone  Leighton Ruff. Redmond Pulling, MD, FACS General, Bariatric, & Minimally Invasive Surgery [facility], [place]    LOS: 4 days    Greer Pickerel 10/05/2018

## 2018-10-05 NOTE — Anesthesia Post-op Follow-up Note (Signed)
  Anesthesia Pain Follow-up Note  Patient: Tristan Hughes  Day #: 4  Date of Follow-up: 10/05/2018 Time: 10:01 PM  Last Vitals:  Vitals:   10/05/18 2015 10/05/18 2100  BP:  (!) 136/55  Pulse:  78  Resp: (!) 23 20  Temp:    SpO2: 100% 100%    Level of Consciousness: alert  Pain: mild   Side Effects:None  Catheter Site Exam:clean, dry  Epidural / Intrathecal (From admission, onward)   Start     Dose/Rate Route Frequency Ordered Stop   10/01/18 0900  ropivacaine (PF) 2 mg/mL (0.2%) (NAROPIN) injection     8 mL/hr 8 mL/hr  Epidural Continuous 10/01/18 0859         Plan: Continue current therapy of postop epidural at surgeon's request  Pina Sirianni

## 2018-10-05 NOTE — Progress Notes (Signed)
Anesthesia notified that patient's epidural dressing is due to be changed on 10/05/2018. Will continue to monitor.

## 2018-10-05 NOTE — Progress Notes (Signed)
Updated wife again.  Told her I would pass her and her daughter's concern about the pt's longstanding hx of anxiety and depression issues to Dr Barry Dienes. Pt had been declining medication in the past for these issues.  Explained that we will treat anxiety with prn ativan for time being But really no role in consulting psych right now since pt NPO and can't crush meds for J tube.   Leighton Ruff. Redmond Pulling, MD, FACS General, Bariatric, & Minimally Invasive Surgery [facility], [place]

## 2018-10-06 ENCOUNTER — Inpatient Hospital Stay (HOSPITAL_COMMUNITY): Payer: Medicare Other

## 2018-10-06 LAB — CBC
HCT: 21.1 % — ABNORMAL LOW (ref 39.0–52.0)
Hemoglobin: 7 g/dL — ABNORMAL LOW (ref 13.0–17.0)
MCH: 30.7 pg (ref 26.0–34.0)
MCHC: 33.2 g/dL (ref 30.0–36.0)
MCV: 92.5 fL (ref 80.0–100.0)
Platelets: 197 10*3/uL (ref 150–400)
RBC: 2.28 MIL/uL — ABNORMAL LOW (ref 4.22–5.81)
RDW: 14.4 % (ref 11.5–15.5)
WBC: 16.1 10*3/uL — ABNORMAL HIGH (ref 4.0–10.5)
nRBC: 1.6 % — ABNORMAL HIGH (ref 0.0–0.2)

## 2018-10-06 LAB — GLUCOSE, CAPILLARY
Glucose-Capillary: 166 mg/dL — ABNORMAL HIGH (ref 70–99)
Glucose-Capillary: 178 mg/dL — ABNORMAL HIGH (ref 70–99)
Glucose-Capillary: 198 mg/dL — ABNORMAL HIGH (ref 70–99)
Glucose-Capillary: 206 mg/dL — ABNORMAL HIGH (ref 70–99)
Glucose-Capillary: 206 mg/dL — ABNORMAL HIGH (ref 70–99)
Glucose-Capillary: 212 mg/dL — ABNORMAL HIGH (ref 70–99)

## 2018-10-06 LAB — BASIC METABOLIC PANEL
Anion gap: 6 (ref 5–15)
BUN: 14 mg/dL (ref 8–23)
CO2: 28 mmol/L (ref 22–32)
Calcium: 8 mg/dL — ABNORMAL LOW (ref 8.9–10.3)
Chloride: 106 mmol/L (ref 98–111)
Creatinine, Ser: 0.81 mg/dL (ref 0.61–1.24)
GFR calc Af Amer: >60 mL/min (ref >60)
GFR calc non Af Amer: >60 mL/min (ref >60)
Glucose, Bld: 225 mg/dL — ABNORMAL HIGH (ref 70–99)
Potassium: 3.8 mmol/L (ref 3.5–5.1)
Sodium: 140 mmol/L (ref 135–145)

## 2018-10-06 MED ORDER — PIVOT 1.5 CAL PO LIQD
1000.0000 mL | ORAL | Status: DC
  Administered 2018-10-06: 1000 mL

## 2018-10-06 MED ORDER — IOHEXOL 300 MG/ML  SOLN
150.0000 mL | Freq: Once | INTRAMUSCULAR | Status: AC | PRN
  Administered 2018-10-06: 16:00:00 150 mL via ORAL

## 2018-10-06 MED ORDER — PIVOT 1.5 CAL PO LIQD: 1000.0000 mL | ORAL | Status: DC

## 2018-10-06 NOTE — Anesthesia Post-op Follow-up Note (Signed)
  Anesthesia Pain Follow-up Note  Patient: Tristan Hughes  Day #: 5 Date of Follow-up: 10/06/2018 Time: 9:32 AM  Last Vitals:  Vitals:   10/06/18 0800 10/06/18 0831  BP: 123/69   Pulse: 84   Resp: (!) 23 (!) 26  Temp: 37 C   SpO2: 95% 93%    Level of Consciousness: alert  Pain: none   Side Effects:None  Catheter Site Exam:clean  Epidural / Intrathecal (From admission, onward)   Start     Dose/Rate Route Frequency Ordered Stop   10/01/18 0900  ropivacaine (PF) 2 mg/mL (0.2%) (NAROPIN) injection     8 mL/hr 8 mL/hr  Epidural Continuous 10/01/18 0859         Plan: Continue current therapy of postop epidural at surgeon's request  [place]

## 2018-10-06 NOTE — Progress Notes (Addendum)
5 Days Post-Op   Subjective/Chief Complaint: No acute change States pain ok No flatus  Objective: Vital signs in last 24 hours: Temp:  [98.1 F (36.7 C)-101.1 F (38.4 C)] 98.6 F (37 C) (06/07 0800) Pulse Rate:  [77-102] 84 (06/07 0800) Resp:  [18-35] 26 (06/07 0831) BP: (105-161)/(52-94) 123/69 (06/07 0800) SpO2:  [91 %-100 %] 93 % (06/07 0831) Weight:  [96.7 kg] 96.7 kg (06/07 0500) Last BM Date: (PTA)  Intake/Output from previous day: 06/06 0701 - 06/07 0700 In: 4172.9 [I.V.:2767.7; NG/GT:943.5; IV Piggyback:50] Out: 1308 [Urine:1360; Emesis/NG output:185; Drains:100] Intake/Output this shift: No intake/output data recorded.   General appearance: alert, cooperative and no distress Resp: breathing comfortably, only 750 on IS Cardio: regular rate and rhythm GI: soft, mild distended, approp tender.  drain serosang. Incision c/d/i no cellulitis   Extremities: extremities normal, atraumatic, no cyanosis or edema  Lab Results:  Recent Labs    10/05/18 0503 10/06/18 0552  WBC 19.1* 16.1*  HGB 7.1* 7.0*  HCT 21.7* 21.1*  PLT 160 197   BMET Recent Labs    10/05/18 0503 10/06/18 0552  NA 142 140  K 3.8 3.8  CL 107 106  CO2 26 28  GLUCOSE 205* 225*  BUN 12 14  CREATININE 0.87 0.81  CALCIUM 8.2* 8.0*   PT/INR No results for input(s): LABPROT, INR in the last 72 hours. ABG No results for input(s): PHART, HCO3 in the last 72 hours.  Invalid input(s): PCO2, PO2  Studies/Results: No results found.  Anti-infectives: Anti-infectives (From admission, onward)   Start     Dose/Rate Route Frequency Ordered Stop   10/01/18 1600  ceFAZolin (ANCEF) IVPB 2g/100 mL premix     2 g 200 mL/hr over 30 Minutes Intravenous Every 8 hours 10/01/18 1539 10/01/18 1759   10/01/18 0631  ceFAZolin (ANCEF) 2-4 GM/100ML-% IVPB    Note to Pharmacy:  Marga Melnick   : cabinet override      10/01/18 0631 10/01/18 0814   10/01/18 0630  ceFAZolin (ANCEF) IVPB 2g/100 mL premix     2  g 200 mL/hr over 30 Minutes Intravenous On call to O.R. 10/01/18 6578 10/01/18 4696      Assessment/Plan: s/p Procedure(s): LAPAROSCOPY DIAGNOSTIC (N/A) JEJUNOSTOMY TUBE PLACEMENT (N/A) PYLOROPLASTY  (N/A) OPEN PROXIMAL GASTRECTOMY  OPERATIVE ULTRASOUND  ABL anemia - got 1 u 6/5. Stable this am.   Recheck in AM. Continue foley due to urinary output monitoring and epidural in place.  nutrition consult - increase tube feeds to 40 ml/hr (goal is 60ml/hr) Increase lantus for DM. Plan upper GI on POD 5 (today) to assess for leak before removal of NGT or any po meds. Albumin for volume support for 48 hours.Madaline Brilliant for LIQUID MEDS ONLY via J tube.  No crushed meds.   Restart low dose Metoprolol for HTN/HR via J tube   Moderate protein calorie malnutrition. OOB, pulm toilet  Plan for epidural to come out on Sunday along with foley, start lovenox (if hgb stable and epidural out) oxycodone via J tube prn for addl pain control Low dose ativan PRN for anxiety  Needs to mobilize and work on pulmonary toilet.   Clovis Riley  MD, FACS General, Bariatric, & Minimally Invasive Surgery [facility], [place]    LOS: 5 days    Clovis Riley 10/06/2018

## 2018-10-07 ENCOUNTER — Inpatient Hospital Stay (HOSPITAL_COMMUNITY): Payer: Medicare Other

## 2018-10-07 LAB — GLUCOSE, CAPILLARY
Glucose-Capillary: 182 mg/dL — ABNORMAL HIGH (ref 70–99)
Glucose-Capillary: 200 mg/dL — ABNORMAL HIGH (ref 70–99)
Glucose-Capillary: 216 mg/dL — ABNORMAL HIGH (ref 70–99)
Glucose-Capillary: 218 mg/dL — ABNORMAL HIGH (ref 70–99)
Glucose-Capillary: 245 mg/dL — ABNORMAL HIGH (ref 70–99)
Glucose-Capillary: 262 mg/dL — ABNORMAL HIGH (ref 70–99)

## 2018-10-07 LAB — CBC
HCT: 24.1 % — ABNORMAL LOW (ref 39.0–52.0)
Hemoglobin: 7.6 g/dL — ABNORMAL LOW (ref 13.0–17.0)
MCH: 29.3 pg (ref 26.0–34.0)
MCHC: 31.5 g/dL (ref 30.0–36.0)
MCV: 93.1 fL (ref 80.0–100.0)
Platelets: 273 10*3/uL (ref 150–400)
RBC: 2.59 MIL/uL — ABNORMAL LOW (ref 4.22–5.81)
RDW: 14.4 % (ref 11.5–15.5)
WBC: 22.5 10*3/uL — ABNORMAL HIGH (ref 4.0–10.5)
nRBC: 1.7 % — ABNORMAL HIGH (ref 0.0–0.2)

## 2018-10-07 LAB — URINALYSIS, COMPLETE (UACMP) WITH MICROSCOPIC
Bilirubin Urine: NEGATIVE
Glucose, UA: 250 mg/dL — AB
Ketones, ur: NEGATIVE mg/dL
Leukocytes,Ua: NEGATIVE
Nitrite: NEGATIVE
Protein, ur: 30 mg/dL — AB
Specific Gravity, Urine: 1.015 (ref 1.005–1.030)
Squamous Epithelial / HPF: NONE SEEN (ref 0–5)
pH: 5.5 (ref 5.0–8.0)

## 2018-10-07 LAB — BASIC METABOLIC PANEL
Anion gap: 8 (ref 5–15)
BUN: 16 mg/dL (ref 8–23)
CO2: 26 mmol/L (ref 22–32)
Calcium: 8.2 mg/dL — ABNORMAL LOW (ref 8.9–10.3)
Chloride: 106 mmol/L (ref 98–111)
Creatinine, Ser: 0.89 mg/dL (ref 0.61–1.24)
GFR calc Af Amer: >60 mL/min (ref >60)
GFR calc non Af Amer: >60 mL/min (ref >60)
Glucose, Bld: 257 mg/dL — ABNORMAL HIGH (ref 70–99)
Potassium: 4.1 mmol/L (ref 3.5–5.1)
Sodium: 140 mmol/L (ref 135–145)

## 2018-10-07 LAB — MAGNESIUM: Magnesium: 2.1 mg/dL (ref 1.7–2.4)

## 2018-10-07 MED ORDER — PIVOT 1.5 CAL PO LIQD: 1000.0000 mL | ORAL | Status: DC

## 2018-10-07 MED ORDER — ENOXAPARIN SODIUM 40 MG/0.4ML ~~LOC~~ SOLN
40.0000 mg | SUBCUTANEOUS | Status: DC
  Administered 2018-10-07 – 2018-10-15 (×9): 40 mg via SUBCUTANEOUS
  Filled 2018-10-07 (×9): qty 0.4

## 2018-10-07 MED ORDER — ORAL CARE MOUTH RINSE
15.0000 mL | Freq: Two times a day (BID) | OROMUCOSAL | Status: DC
  Administered 2018-10-07 – 2018-10-12 (×11): 15 mL via OROMUCOSAL

## 2018-10-07 MED ORDER — CHLORHEXIDINE GLUCONATE 0.12 % MT SOLN
15.0000 mL | Freq: Two times a day (BID) | OROMUCOSAL | Status: DC
  Administered 2018-10-07 – 2018-10-21 (×24): 15 mL via OROMUCOSAL
  Filled 2018-10-07 (×20): qty 15

## 2018-10-07 MED ORDER — INSULIN GLARGINE 100 UNIT/ML ~~LOC~~ SOLN
30.0000 [IU] | Freq: Every day | SUBCUTANEOUS | Status: DC
  Administered 2018-10-07 – 2018-10-08 (×2): 30 [IU] via SUBCUTANEOUS
  Filled 2018-10-07 (×2): qty 0.3

## 2018-10-07 MED ORDER — PIVOT 1.5 CAL PO LIQD
1000.0000 mL | ORAL | Status: AC
  Administered 2018-10-07 – 2018-10-15 (×11): 1000 mL
  Filled 2018-10-07 (×8): qty 1000

## 2018-10-07 MED FILL — FIBRINOGEN-THROMBIN PREFILLED SYRINGE KIT 10 ML: CUTANEOUS | Qty: 10 | Status: AC

## 2018-10-07 NOTE — Progress Notes (Signed)
ANTICOAGULATION CONSULT NOTE - Initial Consult  Pharmacy Consult for Enoxaparin Indication: VTE prophylaxis  Allergies  Allergen Reactions  . Naproxen Sodium Swelling    SWELLING REACTION UNSPECIFIED     Patient Measurements: Height: 5\' 9"  (175.3 cm) Weight: 214 lb 4.6 oz (97.2 kg) IBW/kg (Calculated) : 70.7  Vital Signs: Temp: 99.3 F (37.4 C) (06/08 1600) Temp Source: Oral (06/08 1600) BP: 130/58 (06/08 1700) Pulse Rate: 93 (06/08 1700)  Labs: Recent Labs    10/05/18 0503 10/06/18 0552 10/07/18 0603  HGB 7.1* 7.0* 7.6*  HCT 21.7* 21.1* 24.1*  PLT 160 197 273  CREATININE 0.87 0.81 0.89    Estimated Creatinine Clearance: 79.9 mL/min (by C-G formula based on SCr of 0.89 mg/dL).  Medical History: Past Medical History:  Diagnosis Date  . Anxiety   . CAD 07/09/2009   Cath 12-02-10 90% left main, 90%LAD, 99% circumflex, 80% RCA  . Cancer ([place])    Stomach and esophagus. 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 Cataract    basal cell CA- left ear  . COLONIC POLYPS, HX OF 07/09/2009   Qualifier: Diagnosis of  By: Valma Cava LPN, [name]    . Depression   . DIAB W/O COMP TYPE II/UNS NOT STATED UNCNTRL 07/09/2009   Qualifier: Diagnosis of  By: Joyce Gross    . Family history of colon cancer   . Family history of pancreatic cancer   . GERD (gastroesophageal reflux disease)   . History of chicken pox   . HYPERLIPIDEMIA 07/09/2009   Qualifier: Diagnosis of  By: Valma Cava LPN, [name]    . HYPERTENSION 07/09/2009   Qualifier: Diagnosis of  By: Valma Cava LPN, [name]    . OBSTRUCTIVE SLEEP APNEA 07/09/2009   Qualifier: Diagnosis of  By: Joyce Gross    . 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 Epidural removed today (6/8) ~10:30 AM. Pharmacy is consulted to dose enoxaparin for VTE prophylaxis. Hgb 7.6  (stable). Scr 0.89 (stable), with CrCl ~80 mL/min.   Goal of Therapy:  Prevention of VTE  Monitor platelets by anticoagulation protocol: Yes   Plan:  Initiate VTE prophylaxis with enoxaparin 40 mg SQ daily, starting at least 6 hrs after epidural was pulled.  Thank you for involving Pharmacy in the care of this patient.  Gillermina Hu, PharmD, BCPS, [facility] Clinical Pharmacist 10/07/2018,5:56 PM

## 2018-10-07 NOTE — TOC Transition Note (Signed)
Transition of Care [facility]) - CM/SW Discharge Note   Patient Details  Name: Tristan Hughes MRN: [id number] Date of Birth: 12/31/1940  [facility] CM/SW Contact:  Ella Bodo, RN Phone Number: 10/07/2018, 4:46 PM   Clinical Narrative:   Pt admitted on 10/01/18 s/p exp lap, J-tube placement, pyloroplasty, and open proximal gastrectomy.  PTA, pt independent, lives with spouse; he has 8 children.  Currently has PCA pump; epidural dc'd today.  Recommend PT/OT consults to assist with discharge planning.        Barriers to Discharge: Continued Medical Work up                               Discharge Plan and Services   Discharge Planning Services: CM Consult                                      Readmission Risk Interventions No flowsheet data found.  Reinaldo Raddle, RN, BSN  Trauma/Neuro ICU Case Manager [phone]

## 2018-10-07 NOTE — Anesthesia Post-op Follow-up Note (Signed)
  Anesthesia Pain Follow-up Note  Patient: LORAINE FREID  Day #: 6  Date of Follow-up: 10/07/2018 Time: 10:28 AM  Last Vitals:  Vitals:   10/07/18 0825 10/07/18 0900  BP:  (!) 147/60  Pulse:  94  Resp: (!) 27 (!) 22  Temp:    SpO2: 90% 97%    Level of Consciousness: alert  Pain: moderate   Side Effects:None  Catheter Site Exam:clean, dry, no drainage  Epidural / Intrathecal (From admission, onward)   Start     Dose/Rate Route Frequency Ordered Stop   10/01/18 0900  ropivacaine (PF) 2 mg/mL (0.2%) (NAROPIN) injection     8 mL/hr 8 mL/hr  Epidural Continuous 10/01/18 0859         Plan: Catheter removed/tip intact at surgeon's request, D/C Infusion at surgeon's request and D/C from anesthesia care at surgeon's request  Ramona Ruark,W. EDMOND

## 2018-10-07 NOTE — Progress Notes (Signed)
6 Days Post-Op   Subjective/Chief Complaint: No n/v.  Pt's upper GI was not great quality due to patient's difficulty moving. Could not get orthogonal or oblique views.  Also had fever last night.  Tube feeds at 40 ml/hr  Objective: Vital signs in last 24 hours: Temp:  [97.9 F (36.6 C)-102 F (38.9 C)] 99.3 F (37.4 C) (06/08 1600) Pulse Rate:  [74-96] 93 (06/08 1700) Resp:  [12-27] 14 (06/08 1700) BP: (124-162)/(54-72) 130/58 (06/08 1700) SpO2:  [90 %-98 %] 97 % (06/08 1700) Weight:  [97.2 kg] 97.2 kg (06/08 0500) Last BM Date: (PTA)  Intake/Output from previous day: 06/07 0701 - 06/08 0700 In: 3160.3 [I.V.:1888.5; NG/GT:930; IV Piggyback:50] Out: 8295 [Urine:1375; Emesis/NG output:275; Drains:135] Intake/Output this shift: Total I/O In: 895.3 [I.V.:415.4; Other:26.7; NG/GT:453.3] Out: 425 [Urine:425]   General appearance: alert, cooperative.  Looks flushed.   Resp: breathing comfortably Cardio: regular rate and rhythm GI: soft, mild distended, approp tender.  drain serosang. Incision c/d/i no cellulitis   Extremities: extremities normal, atraumatic, no cyanosis or edema  Lab Results:  Recent Labs    10/06/18 0552 10/07/18 0603  WBC 16.1* 22.5*  HGB 7.0* 7.6*  HCT 21.1* 24.1*  PLT 197 273   BMET Recent Labs    10/06/18 0552 10/07/18 0603  NA 140 140  K 3.8 4.1  CL 106 106  CO2 28 26  GLUCOSE 225* 257*  BUN 14 16  CREATININE 0.81 0.89  CALCIUM 8.0* 8.2*   PT/INR No results for input(s): LABPROT, INR in the last 72 hours. ABG No results for input(s): PHART, HCO3 in the last 72 hours.  Invalid input(s): PCO2, PO2  Studies/Results: Dg Chest Port 1 View  Result Date: 10/07/2018 CLINICAL DATA:  Fever and shortness of breath. EXAM: PORTABLE CHEST 1 VIEW COMPARISON:  PET-CT dated August 15, 2018. CT chest, abdomen, and pelvis dated July 29, 2018. FINDINGS: Unchanged right chest wall port catheter with tip at the cavoatrial junction. Enteric tube entering  the stomach with the tip below the field of view. The heart size and mediastinal contours are within normal limits. Normal pulmonary vascularity. Small left pleural effusion with adjacent atelectasis. No pneumothorax. No acute osseous abnormality. IMPRESSION: 1. Small left pleural effusion with adjacent basilar atelectasis. Electronically Signed   By: Titus Dubin M.D.   On: 10/07/2018 09:39   Dg Abd Portable 1v  Result Date: 10/07/2018 CLINICAL DATA:  Abdominal pain. 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 EXAM: PORTABLE ABDOMEN - 1 VIEW COMPARISON:  PET-CT dated August 15, 2018. CT abdomen pelvis dated July 29, 2018. FINDINGS: Enteric tube in the gastric remnant. Surgical drain in the left upper quadrant. Jejunostomy tube tip in the left abdomen. Nonobstructive bowel gas pattern. Stool in the right colon. Areas of lucency in the left upper quadrant are likely due to postsurgical change and overlapping colon. No acute osseous abnormality. IMPRESSION: 1. No acute finding.  No obstruction. Electronically Signed   By: Titus Dubin M.D.   On: 10/07/2018 09:50   Dg Duanne Limerick W Single Cm (sol Or Thin Ba)  Result Date: 10/06/2018 CLINICAL DATA:  Proximal gastrectomy on 10/01/2018. EXAM: UPPER GI SERIES WITHOUT KUB TECHNIQUE: Routine upper GI series was performed with 150 cc of Omnipaque 300 FLUOROSCOPY TIME:  Fluoroscopy Time:  5 minutes and 48 seconds Radiation Exposure Index (if provided by the fluoroscopic device): Not applicable. Number of Acquired Spot Images: 0 COMPARISON:  07/29/2018 CT FINDINGS: Markedly limited exam, secondary to patient  clinical status and immobility. The patient was uncomfortable throughout, and exhibited coughing and discomfort with swallows. The upper and mid esophagus are normal in caliber. Apparent partial gastric pull-through with portion of the stomach (versus a dilated distal esophagus) positioned in the lower chest. Poor esophageal  motility, with contrast stasis in the upper chest. With upright positioning, moderate opacification of the remaining stomach to the level of the nasogastric tube. Example series 8. No contrast extravasation identified, given above limitations. Patient could not be placed in obliquities secondary to clinical status and discomfort. IMPRESSION: Markedly limited exam, performed with patient AP only. Suboptimal opacification of the esophagus and proximal stomach, without findings to suggest contrast leak. Electronically Signed   By: Abigail Miyamoto M.D.   On: 10/06/2018 17:55    Anti-infectives: Anti-infectives (From admission, onward)   Start     Dose/Rate Route Frequency Ordered Stop   10/01/18 1600  ceFAZolin (ANCEF) IVPB 2g/100 mL premix     2 g 200 mL/hr over 30 Minutes Intravenous Every 8 hours 10/01/18 1539 10/01/18 1759   10/01/18 0631  ceFAZolin (ANCEF) 2-4 GM/100ML-% IVPB    Note to Pharmacy:  Marga Melnick   : cabinet override      10/01/18 0631 10/01/18 0814   10/01/18 0630  ceFAZolin (ANCEF) IVPB 2g/100 mL premix     2 g 200 mL/hr over 30 Minutes Intravenous On call to O.R. 10/01/18 7412 10/01/18 8786      Assessment/Plan: s/p Procedure(s): LAPAROSCOPY DIAGNOSTIC (N/A) JEJUNOSTOMY TUBE PLACEMENT (N/A) PYLOROPLASTY  (N/A) OPEN PROXIMAL GASTRECTOMY  OPERATIVE ULTRASOUND  ABL anemia - got 1 u 6/5. Stable this am.   Recheck in AM. Fever - u/a, cultures, CXR, abd xray to see if any contrast is lingering.  If still having fevers tomorrow, will start antibiotics and get CT scan.   D/c foley tomorrow.   nutrition consult - tube feeds to goal over next 24 hours.   Increase lantus for DM.  Ok for LIQUID MEDS ONLY via J tube.  No crushed meds.   Low dose Metoprolol for HTN/HR via J tube   Moderate protein calorie malnutrition. OOB, pulm toilet  Lovenox.   Oxycodone via J tube prn for addl pain control Low dose ativan PRN for anxiety  Needs to mobilize and work on pulmonary  toilet.   Stark Klein  MD, FACS Surgical Oncology, General Surgery, Trauma and [facility], [place]    LOS: 6 days    Stark Klein 10/07/2018

## 2018-10-07 NOTE — Progress Notes (Signed)
Inpatient Diabetes Program Recommendations  AACE/ADA: New Consensus Statement on Inpatient Glycemic Control (2015)  Target Ranges:  Prepandial:   less than 140 mg/dL      Peak postprandial:   less than 180 mg/dL (1-2 hours)      Critically ill patients:  140 - 180 mg/dL    Results for KARLIN, HEILMAN "NORM" (MRN [id number]) as of 10/07/2018 15:02  Ref. Range 10/06/2018 08:17 10/06/2018 12:04 10/06/2018 16:06 10/06/2018 20:25 10/06/2018 23:33 10/07/2018 03:33 10/07/2018 08:10 10/07/2018 11:35  Glucose-Capillary Latest Ref Range: 70 - 99 mg/dL 206 (H) 212 (H) 166 (H) 198 (H) 206 (H) 216 (H) 218 (H) 182 (H)   Review of Glycemic Control  Diabetes history: DM 2  Current orders for Inpatient glycemic control: Lantus 30 units Novolog 0-20 units Q4 hours  Inpatient Diabetes Program Recommendations:    Pivot 1.5 @ 50 ml/hr,increasing to 65 ml/hr per protocol  Noted Lantus increased today. Consider adding Novolog 4 units Q4 hours Tube Feed Coverage (Do not give if Tube Feeds are stopped or held).  Thanks,  Tama Headings RN, MSN, BC-ADM Inpatient Diabetes Coordinator Team Pager [phone] (8a-5p)

## 2018-10-07 NOTE — Progress Notes (Signed)
Initial Nutrition Assessment  DOCUMENTATION CODES:   Not applicable  INTERVENTION:   Pivot 1.5 @ 50 ml/hr, increase to goal 65 ml/hr per protocol Provides: 2340 kcal, 146 grams protein, and 1184 ml free water.   NUTRITION DIAGNOSIS:   Increased nutrient needs related to cancer and cancer related treatments as evidenced by estimated needs. Ongoing.   GOAL:   Patient will meet greater than or equal to 90% of their needs Progressing.   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    TF advancing to goal via J-tube NG tube removed.   Medications reviewed and include: colace, SSI, 30 units lantus daily D5 1/2 NS with 20 mEq KCl @ 20 ml/hr  Labs reviewed: cbg: 218 Drain: 135 ml  NUTRITION - FOCUSED PHYSICAL EXAM:  Deferred   Diet Order:   Diet Order            Diet NPO time specified  Diet effective now              EDUCATION NEEDS:   No education needs have been identified at this time  Skin:  Skin Assessment: Reviewed RN Assessment  Last BM:  unknown  Height:   Ht Readings from Last 1 Encounters:  10/01/18 5\' 9"  (1.753 m)    Weight:   Wt Readings from Last 1 Encounters:  10/07/18 97.2 kg    Ideal Body Weight:  72.7 kg  BMI:  Body mass index is 31.64 kg/m.  Estimated Nutritional Needs:   Kcal:  2300-2500  Protein:  120-135 grams  Fluid:  >2 L/day  Maylon Peppers RD, LDN, CNSC [phone] Pager [phone] After Hours Pager

## 2018-10-08 ENCOUNTER — Inpatient Hospital Stay (HOSPITAL_COMMUNITY): Payer: Medicare Other

## 2018-10-08 LAB — GLUCOSE, CAPILLARY
Glucose-Capillary: 255 mg/dL — ABNORMAL HIGH (ref 70–99)
Glucose-Capillary: 231 mg/dL — ABNORMAL HIGH (ref 70–99)
Glucose-Capillary: 180 mg/dL — ABNORMAL HIGH (ref 70–99)
Glucose-Capillary: 203 mg/dL — ABNORMAL HIGH (ref 70–99)
Glucose-Capillary: 222 mg/dL — ABNORMAL HIGH (ref 70–99)

## 2018-10-08 LAB — MAGNESIUM: Magnesium: 2.2 mg/dL (ref 1.7–2.4)

## 2018-10-08 LAB — CBC
HCT: 22.9 % — ABNORMAL LOW (ref 39.0–52.0)
Hemoglobin: 7.3 g/dL — ABNORMAL LOW (ref 13.0–17.0)
MCH: 29.4 pg (ref 26.0–34.0)
MCHC: 31.9 g/dL (ref 30.0–36.0)
MCV: 92.3 fL (ref 80.0–100.0)
Platelets: 331 10*3/uL (ref 150–400)
RBC: 2.48 MIL/uL — ABNORMAL LOW (ref 4.22–5.81)
RDW: 14.6 % (ref 11.5–15.5)
WBC: 21.6 10*3/uL — ABNORMAL HIGH (ref 4.0–10.5)
nRBC: 2 % — ABNORMAL HIGH (ref 0.0–0.2)

## 2018-10-08 LAB — BASIC METABOLIC PANEL
Anion gap: 7 (ref 5–15)
BUN: 18 mg/dL (ref 8–23)
CO2: 27 mmol/L (ref 22–32)
Calcium: 8 mg/dL — ABNORMAL LOW (ref 8.9–10.3)
Chloride: 108 mmol/L (ref 98–111)
Creatinine, Ser: 0.85 mg/dL (ref 0.61–1.24)
GFR calc Af Amer: >60 mL/min (ref >60)
GFR calc non Af Amer: >60 mL/min (ref >60)
Glucose, Bld: 268 mg/dL — ABNORMAL HIGH (ref 70–99)
Potassium: 4.2 mmol/L (ref 3.5–5.1)
Sodium: 142 mmol/L (ref 135–145)

## 2018-10-08 LAB — URINE CULTURE: Culture: NO GROWTH

## 2018-10-08 MED ORDER — PIPERACILLIN-TAZOBACTAM 3.375 G IVPB 30 MIN
3.3750 g | Freq: Once | INTRAVENOUS | Status: AC
  Administered 2018-10-08: 3.375 g via INTRAVENOUS
  Filled 2018-10-08: qty 50

## 2018-10-08 MED ORDER — VANCOMYCIN HCL 10 G IV SOLR
1500.0000 mg | INTRAVENOUS | Status: DC
  Administered 2018-10-09 – 2018-10-11 (×3): 1500 mg via INTRAVENOUS
  Filled 2018-10-08 (×4): qty 1500

## 2018-10-08 MED ORDER — PIPERACILLIN-TAZOBACTAM 3.375 G IVPB
3.3750 g | Freq: Three times a day (TID) | INTRAVENOUS | Status: DC
  Administered 2018-10-09 – 2018-10-18 (×28): 3.375 g via INTRAVENOUS
  Filled 2018-10-08 (×30): qty 50

## 2018-10-08 MED ORDER — INSULIN GLARGINE 100 UNIT/ML ~~LOC~~ SOLN
35.0000 [IU] | Freq: Every day | SUBCUTANEOUS | Status: DC
  Administered 2018-10-09: 09:00:00 35 [IU] via SUBCUTANEOUS
  Filled 2018-10-08 (×2): qty 0.35

## 2018-10-08 MED ORDER — OXYCODONE HCL 5 MG/5ML PO SOLN
5.0000 mg | Freq: Three times a day (TID) | ORAL | Status: DC
  Administered 2018-10-08 – 2018-10-15 (×21): 5 mg
  Filled 2018-10-08 (×21): qty 5

## 2018-10-08 MED ORDER — IOHEXOL 300 MG/ML  SOLN
150.0000 mL | Freq: Once | INTRAMUSCULAR | Status: AC | PRN
  Administered 2018-10-08: 100 mL via ORAL

## 2018-10-08 MED ORDER — BISACODYL 10 MG RE SUPP
10.0000 mg | Freq: Every day | RECTAL | Status: DC
  Administered 2018-10-08 – 2018-10-17 (×8): 10 mg via RECTAL
  Filled 2018-10-08 (×8): qty 1

## 2018-10-08 MED ORDER — VANCOMYCIN HCL 10 G IV SOLR
1500.0000 mg | Freq: Once | INTRAVENOUS | Status: AC
  Administered 2018-10-08: 1500 mg via INTRAVENOUS
  Filled 2018-10-08: qty 1500

## 2018-10-08 NOTE — Progress Notes (Signed)
Pt mobilized to the chair today. He was extremely weak but was able transfer with the use of a walker. He remained in the chair for approximately 2 hours and then wanted to get back to bed.

## 2018-10-08 NOTE — Progress Notes (Signed)
Pharmacy Antibiotic Note  ARATH KAIGLER is a 78 y.o. male admitted on 10/01/2018 with concern for mild anastomotic leak posteriorly at the esophagogastric anastomosis. Pharmacy consulted to start Vancomycin & Zosyn  Plan: - Zosyn 3.375g IV x 1 followed by 3.375g IV every 8 hours - Vancomycin 1500 mg IV every 24 hours - Will continue to follow renal function, culture results, LOT, and antibiotic de-escalation plans   Height: 5\' 9"  (175.3 cm) Weight: 216 lb 0.8 oz (98 kg) IBW/kg (Calculated) : 70.7  Temp (24hrs), Avg:99 F (37.2 C), Min:98.3 F (36.8 C), Max:100.3 F (37.9 C)  Recent Labs  Lab 10/04/18 0521 10/04/18 0732 10/05/18 0503 10/06/18 0552 10/07/18 0603 10/08/18 0929  WBC 21.3* 23.0* 19.1* 16.1* 22.5* 21.6*  CREATININE 1.08  --  0.87 0.81 0.89 0.85    Estimated Creatinine Clearance: 84 mL/min (by C-G formula based on SCr of 0.85 mg/dL).    Allergies  Allergen Reactions  . Naproxen Sodium Swelling    SWELLING REACTION UNSPECIFIED     Antimicrobials this admission: Vanc 6/9 >> Zosyn  6/9 >>  Dose adjustments this admission: n/a  Microbiology results: 6/8 UCx >> 6/8 BCx >> ngtd  Thank you for allowing pharmacy to be a part of this patient's care.  Alycia Rossetti, PharmD, BCPS Clinical Pharmacist Clinical phone for 10/08/2018: [phone] 10/08/2018 4:50 PM   **Pharmacist phone directory can now be found on [email or web address] ([email or web address]).  Listed under [place].

## 2018-10-08 NOTE — Progress Notes (Signed)
7 Days Post-Op   Subjective/Chief Complaint: Passing some gas.  No BM yet.  A little anxious today.  Cultures pending.  Tmax is much lower over last 24 hours.  Only 100.3 instead of 102.  Epidural pulled yesterday.    Objective: Vital signs in last 24 hours: Temp:  [98.3 F (36.8 C)-100.3 F (37.9 C)] 98.8 F (37.1 C) (06/09 0800) Pulse Rate:  [84-105] 91 (06/09 1000) Resp:  [13-26] 22 (06/09 1000) BP: (121-156)/(54-70) 136/66 (06/09 1000) SpO2:  [90 %-98 %] 93 % (06/09 1000) FiO2 (%):  [94 %] 94 % (06/09 0802) Weight:  [98 kg] 98 kg (06/09 0500) Last BM Date: (PTA)  Intake/Output from previous day: 06/08 0701 - 06/09 0700 In: 2204.8 [I.V.:829.8; WV/PX:1062.6; IV Piggyback:50] Out: 2160 [Urine:1775; Emesis/NG output:300; Drains:85] Intake/Output this shift: Total I/O In: 350.1 [I.V.:90.1; NG/GT:260] Out: -    General appearance: a little sleepy.   cooperative.  Breathing much better today.  Not flushed.    Resp: breathing comfortably Cardio: regular rate and rhythm GI: soft, mild distended, approp tender.  drain serosang. Incision c/d/i no cellulitis   Extremities: extremities normal, atraumatic, no cyanosis or edema  Lab Results:  Recent Labs    10/07/18 0603 10/08/18 0929  WBC 22.5* 21.6*  HGB 7.6* 7.3*  HCT 24.1* 22.9*  PLT 273 331   BMET Recent Labs    10/06/18 0552 10/07/18 0603  NA 140 140  K 3.8 4.1  CL 106 106  CO2 28 26  GLUCOSE 225* 257*  BUN 14 16  CREATININE 0.81 0.89  CALCIUM 8.0* 8.2*   PT/INR No results for input(s): LABPROT, INR in the last 72 hours. ABG No results for input(s): PHART, HCO3 in the last 72 hours.  Invalid input(s): PCO2, PO2  Studies/Results: Dg Chest Port 1 View  Result Date: 10/07/2018 CLINICAL DATA:  Fever and shortness of breath. EXAM: PORTABLE CHEST 1 VIEW COMPARISON:  PET-CT dated August 15, 2018. CT chest, abdomen, and pelvis dated July 29, 2018. FINDINGS: Unchanged right chest wall port catheter with tip at  the cavoatrial junction. Enteric tube entering the stomach with the tip below the field of view. The heart size and mediastinal contours are within normal limits. Normal pulmonary vascularity. Small left pleural effusion with adjacent atelectasis. No pneumothorax. No acute osseous abnormality. IMPRESSION: 1. Small left pleural effusion with adjacent basilar atelectasis. Electronically Signed   By: Titus Dubin M.D.   On: 10/07/2018 09:39   Dg Abd Portable 1v  Result Date: 10/07/2018 CLINICAL DATA:  Abdominal pain. 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 EXAM: PORTABLE ABDOMEN - 1 VIEW COMPARISON:  PET-CT dated August 15, 2018. CT abdomen pelvis dated July 29, 2018. FINDINGS: Enteric tube in the gastric remnant. Surgical drain in the left upper quadrant. Jejunostomy tube tip in the left abdomen. Nonobstructive bowel gas pattern. Stool in the right colon. Areas of lucency in the left upper quadrant are likely due to postsurgical change and overlapping colon. No acute osseous abnormality. IMPRESSION: 1. No acute finding.  No obstruction. Electronically Signed   By: Titus Dubin M.D.   On: 10/07/2018 09:50   Dg Duanne Limerick W Single Cm (sol Or Thin Ba)  Result Date: 10/06/2018 CLINICAL DATA:  Proximal gastrectomy on 10/01/2018. EXAM: UPPER GI SERIES WITHOUT KUB TECHNIQUE: Routine upper GI series was performed with 150 cc of Omnipaque 300 FLUOROSCOPY TIME:  Fluoroscopy Time:  5 minutes and 48 seconds Radiation Exposure Index (if provided by the fluoroscopic  device): Not applicable. Number of Acquired Spot Images: 0 COMPARISON:  07/29/2018 CT FINDINGS: Markedly limited exam, secondary to patient clinical status and immobility. The patient was uncomfortable throughout, and exhibited coughing and discomfort with swallows. The upper and mid esophagus are normal in caliber. Apparent partial gastric pull-through with portion of the stomach (versus a dilated distal esophagus)  positioned in the lower chest. Poor esophageal motility, with contrast stasis in the upper chest. With upright positioning, moderate opacification of the remaining stomach to the level of the nasogastric tube. Example series 8. No contrast extravasation identified, given above limitations. Patient could not be placed in obliquities secondary to clinical status and discomfort. IMPRESSION: Markedly limited exam, performed with patient AP only. Suboptimal opacification of the esophagus and proximal stomach, without findings to suggest contrast leak. Electronically Signed   By: Abigail Miyamoto M.D.   On: 10/06/2018 17:55    Anti-infectives: Anti-infectives (From admission, onward)   Start     Dose/Rate Route Frequency Ordered Stop   10/01/18 1600  ceFAZolin (ANCEF) IVPB 2g/100 mL premix     2 g 200 mL/hr over 30 Minutes Intravenous Every 8 hours 10/01/18 1539 10/01/18 1759   10/01/18 0631  ceFAZolin (ANCEF) 2-4 GM/100ML-% IVPB    Note to Pharmacy:  Marga Melnick   : cabinet override      10/01/18 0631 10/01/18 0814   10/01/18 0630  ceFAZolin (ANCEF) IVPB 2g/100 mL premix     2 g 200 mL/hr over 30 Minutes Intravenous On call to O.R. 10/01/18 1601 10/01/18 0932      Assessment/Plan: s/p Procedure(s): LAPAROSCOPY DIAGNOSTIC (N/A) JEJUNOSTOMY TUBE PLACEMENT (N/A) PYLOROPLASTY  (N/A) OPEN PROXIMAL GASTRECTOMY  OPERATIVE ULTRASOUND  ABL anemia - got 1 u 6/5. Stable this am.   Keep an eye on her. Fever - improved.    Repeat upper GI.  Hope to get NGT out.   D/c foley today..   nutrition consult - tube feeds to goal over next 24 hours.   Increase lantus for DM.  Ok for LIQUID MEDS ONLY via J tube.  No crushed meds.   Low dose Metoprolol for HTN/HR via J tube   Moderate protein calorie malnutrition. OOB, pulm toilet  Lovenox.   Oxycodone via J tube prn for addl pain control standing.   Low dose ativan PRN for anxiety  Needs to mobilize and work on pulmonary toilet. Leave in ICU.  Just looks  a little off.    Stark Klein  MD, FACS Surgical Oncology, General Surgery, Trauma and [facility], [place]    LOS: 7 days    Stark Klein 10/08/2018

## 2018-10-09 LAB — CBC
HCT: 23.7 % — ABNORMAL LOW (ref 39.0–52.0)
Hemoglobin: 7.4 g/dL — ABNORMAL LOW (ref 13.0–17.0)
MCH: 28.8 pg (ref 26.0–34.0)
MCHC: 31.2 g/dL (ref 30.0–36.0)
MCV: 92.2 fL (ref 80.0–100.0)
Platelets: 358 10*3/uL (ref 150–400)
RBC: 2.57 MIL/uL — ABNORMAL LOW (ref 4.22–5.81)
RDW: 14.8 % (ref 11.5–15.5)
WBC: 18.3 10*3/uL — ABNORMAL HIGH (ref 4.0–10.5)
nRBC: 2.8 % — ABNORMAL HIGH (ref 0.0–0.2)

## 2018-10-09 LAB — GLUCOSE, CAPILLARY
Glucose-Capillary: 202 mg/dL — ABNORMAL HIGH (ref 70–99)
Glucose-Capillary: 221 mg/dL — ABNORMAL HIGH (ref 70–99)
Glucose-Capillary: 286 mg/dL — ABNORMAL HIGH (ref 70–99)
Glucose-Capillary: 263 mg/dL — ABNORMAL HIGH (ref 70–99)
Glucose-Capillary: 231 mg/dL — ABNORMAL HIGH (ref 70–99)
Glucose-Capillary: 235 mg/dL — ABNORMAL HIGH (ref 70–99)

## 2018-10-09 LAB — BASIC METABOLIC PANEL
Anion gap: 9 (ref 5–15)
BUN: 20 mg/dL (ref 8–23)
CO2: 29 mmol/L (ref 22–32)
Calcium: 7.9 mg/dL — ABNORMAL LOW (ref 8.9–10.3)
Chloride: 106 mmol/L (ref 98–111)
Creatinine, Ser: 0.83 mg/dL (ref 0.61–1.24)
GFR calc Af Amer: >60 mL/min (ref >60)
GFR calc non Af Amer: >60 mL/min (ref >60)
Glucose, Bld: 251 mg/dL — ABNORMAL HIGH (ref 70–99)
Potassium: 3.7 mmol/L (ref 3.5–5.1)
Sodium: 144 mmol/L (ref 135–145)

## 2018-10-09 NOTE — Evaluation (Signed)
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 Pt PMH including Anxiety, CAD (07/09/2009), Depression, Diabetes, HTN, OSA.   Clinical Impression   PTA Pt was independent in ADL and mobility, bedroom/bathroom on 2nd floor, driving, community ambulator. Today Pt is mod A +2 for stand pivot to recliner. Max to total A for LB ADL due to abdominal sx. Pt educated in bracing with pillow/blanket for comfort. Pt lethargic throughout session stated that he did not sleep well and is just having a hard time getting used to NPO orders. Pt will benefit from skilled OT in the acute setting as well as afterwards at the [facility] level (monitor closely for progress in case of need for increased care at DC.) Next session bring AE for education to maximize LB dressing/bathing.     Follow Up Recommendations  Home health OT;Supervision/Assistance - 24 hour(watch for more needs)    Equipment Recommendations  3 in 1 bedside commode    Recommendations for Other Services       Precautions / Restrictions Precautions Precautions: Fall Precaution Comments: lots of abdominal incisions, drains, peg tube, NG tube Restrictions Weight Bearing Restrictions: No      Mobility Bed Mobility Overal bed mobility: Needs Assistance Bed Mobility: Rolling;Sidelying to Sit Rolling: Mod assist;+2 for physical assistance Sidelying to sit: Min assist;Mod assist;+2 for physical assistance       General bed mobility comments: educated on log roll technique, modA to roll completely on side, verbal cues to push up onto R elbow and with L hand, modA for trunk elevation and to scoot forward to EOB with feet on  the floor  Transfers Overall transfer level: Needs assistance Equipment used: 2 person hand held assist Transfers: Sit to/from [facility] Sit to Stand: Mod assist;+2 physical assistance;+2 safety/equipment Stand pivot transfers: Mod assist;+2 physical assistance;+2 safety/equipment       General transfer comment: pt held onto pillow over abdomen, used rocking momentum and encouraged pt to push up through LEs, modAx2 to power up and steady during transition of hands to RW. pt then able to take a few steps to chair, modA for walker management. pt then able to complete standing marching in place x 10 reps    Balance Overall balance assessment: Needs assistance Sitting-balance support: Feet supported;No upper extremity supported Sitting balance-Leahy Scale: Fair     Standing balance support: Bilateral upper extremity supported Standing balance-Leahy Scale: Poor Standing balance comment: dependent on physical assist and RW                           ADL either performed or assessed with clinical judgement   ADL Overall ADL's : Needs assistance/impaired Eating/Feeding: NPO   Grooming: Minimal assistance;Sitting   Upper Body Bathing: Moderate assistance   Lower Body Bathing: Total assistance   Upper Body Dressing : Moderate assistance;Sitting   Lower Body Dressing: Total assistance;Sitting/lateral leans   Toilet Transfer: Moderate assistance;+2 for physical assistance;+2 for safety/equipment;Stand-pivot;RW   Toileting- Clothing Manipulation and Hygiene: Maximal assistance;+2 for physical assistance;+2 for safety/equipment       Functional mobility during ADLs: Moderate assistance;+2 for physical assistance;+2 for safety/equipment;Rolling walker General ADL Comments: decreased access to LB ADL due to abdominal incision  Vision Baseline Vision/History: Wears glasses Wears Glasses: At all times Patient Visual Report: No change from baseline        Perception     Praxis      Pertinent Vitals/Pain Pain Assessment: 0-10 Pain Score: 2  Pain Location: abdomen Pain Descriptors / Indicators: Sore Pain Intervention(s): Monitored during session;Repositioned;Other (comment)(education for good biomechanics)     Hand Dominance Right   Extremity/Trunk Assessment Upper Extremity Assessment Upper Extremity Assessment: Generalized weakness   Lower Extremity Assessment Lower Extremity Assessment: Generalized weakness   Cervical / Trunk Assessment Cervical / Trunk Assessment: Other exceptions Cervical / Trunk Exceptions: large abdominal surgery   Communication Communication Communication: No difficulties(slow muffled speech due to lethargy)   Cognition Arousal/Alertness: Lethargic Behavior During Therapy: Flat affect Overall Cognitive Status: Within Functional Limits for tasks assessed                                 General Comments: pt able to follow commands and appropriate however very lethargic and required freq v/c's to maintain eyes open, pt states "I am just so tired because I can't eat and drink normally" Pt reported he slept well over night he just can't eat properly   General Comments  pt with multiple abdominal incisions, per RN pt with a leak at an anastomosis    Exercises     Shoulder Instructions      Home Living Family/patient expects to be discharged to:: Private residence Living Arrangements: Spouse/significant other Available Help at Discharge: Family Type of Home: House Home Access: Stairs to enter Technical brewer of Steps: 2 Entrance Stairs-Rails: Right;Left Home Layout: Two level;Able to live on main level with bedroom/bathroom Alternate Level Stairs-Number of Steps: flight   Bathroom Shower/Tub: Occupational psychologist: Standard     Home Equipment: None          Prior Functioning/Environment Level of Independence: Independent        Comments: driving,  retired        Secretary/administrator Problem List: Decreased strength;Decreased range of motion;Decreased activity tolerance;Impaired balance (sitting and/or standing);Decreased safety awareness;Decreased knowledge of use of DME or AE;Decreased knowledge of precautions      OT Treatment/Interventions: Self-care/ADL training;Energy conservation;DME and/or AE instruction;Therapeutic activities;Patient/family education;Balance training    OT Goals(Current goals can be found in the care plan section) Acute Rehab OT Goals Patient Stated Goal: get better OT Goal Formulation: With patient Time For Goal Achievement: 10/23/18 Potential to Achieve Goals: Good ADL Goals Pt Will Perform Grooming: with supervision;standing Pt Will Perform Upper Body Dressing: with modified independence;sitting Pt Will Perform Lower Body Dressing: sit to/from stand;with min assist;with caregiver independent in assisting;with adaptive equipment Pt Will Transfer to Toilet: with supervision Pt Will Perform Toileting - Clothing Manipulation and hygiene: with modified independence;sit to/from stand Additional ADL Goal #1: Pt will perform bed mobility (log roll technique) at supervision level prior to engaging in ADL  OT Frequency: Min 2X/week   Barriers to D/C:            Co-evaluation PT/OT/SLP Co-Evaluation/Treatment: Yes Reason for Co-Treatment: For patient/therapist safety;To address functional/ADL transfers;Complexity of the patient's impairments (multi-system involvement) PT goals addressed during session: Mobility/safety with mobility;Balance;Proper use of DME OT goals addressed during session: ADL's and self-care;Proper use of Adaptive equipment and DME      AM-PAC OT "6 Clicks" Daily Activity     Outcome Measure Help from another person eating meals?: Total(NPO)  Help from another person taking care of personal grooming?: A Little Help from another person toileting, which includes using toliet, bedpan, or urinal?: A  Lot Help from another person bathing (including washing, rinsing, drying)?: A Lot Help from another person to put on and taking off regular upper body clothing?: A Lot Help from another person to put on and taking off regular lower body clothing?: A Lot 6 Click Score: 12   End of Session Equipment Utilized During Treatment: Oxygen;Rolling walker Nurse Communication: Mobility status  Activity Tolerance: Patient tolerated treatment well Patient left: in chair;with call bell/phone within reach;with chair alarm set  OT Visit Diagnosis: Unsteadiness on feet (R26.81);Other abnormalities of gait and mobility (R26.89);Muscle weakness (generalized) (M62.81)                Time: 6270-3500 OT Time Calculation (min): 28 min Charges:  OT General Charges $OT Visit: 1 Visit OT Evaluation $OT Eval Moderate Complexity: [place] OTR/L Acute Rehabilitation Services Pager: [phone] Office: [place] 10/09/2018, 4:19 PM

## 2018-10-09 NOTE — Progress Notes (Signed)
8 Days Post-Op   Subjective/Chief Complaint: Feeling a little better.    Objective: Vital signs in last 24 hours: Temp:  [97.7 F (36.5 C)-99.8 F (37.7 C)] 97.7 F (36.5 C) (06/10 0800) Pulse Rate:  [85-107] 85 (06/10 1000) Resp:  [14-27] 17 (06/10 1000) BP: (122-171)/(49-73) 124/58 (06/10 1000) SpO2:  [87 %-100 %] 99 % (06/10 1000) Weight:  [97.3 kg] 97.3 kg (06/10 0428) Last BM Date: (PTA)  Intake/Output from previous day: 06/09 0701 - 06/10 0700 In: 2856.6 [I.V.:631.8; NG/GT:1625; IV Piggyback:599.7] Out: 1930 [Urine:855; Emesis/NG output:650; Drains:425] Intake/Output this shift: Total I/O In: 297.3 [I.V.:90.3; NG/GT:195; IV Piggyback:12] Out: 350 [Urine:350]   General appearance: Improved mental status.   cooperative.  Breathing much better today.  Not flushed.    Resp: breathing comfortably Cardio: regular rate and rhythm GI: soft, mild distended, approp tender.  drain serosang. Incision c/d/i no cellulitis   Extremities: extremities normal, atraumatic, no cyanosis or edema  Lab Results:  Recent Labs    10/08/18 0929 10/09/18 0427  WBC 21.6* 18.3*  HGB 7.3* 7.4*  HCT 22.9* 23.7*  PLT 331 358   BMET Recent Labs    10/08/18 0929 10/09/18 0427  NA 142 144  K 4.2 3.7  CL 108 106  CO2 27 29  GLUCOSE 268* 251*  BUN 18 20  CREATININE 0.85 0.83  CALCIUM 8.0* 7.9*   PT/INR No results for input(s): LABPROT, INR in the last 72 hours. ABG No results for input(s): PHART, HCO3 in the last 72 hours.  Invalid input(s): PCO2, PO2  Studies/Results: Dg Ugi W Single Cm (sol Or Thin Ba)  Result Date: 10/08/2018 CLINICAL DATA:  Recent partial gastrectomy for gastric cancer. Evaluate for postoperative leak. EXAM: WATER SOLUBLE UPPER GI SERIES TECHNIQUE: Single-column upper GI series was performed using water soluble contrast. CONTRAST:  141mL OMNIPAQUE IOHEXOL 300 MG/ML  SOLN COMPARISON:  Upper GI 10/06/2018 FLUOROSCOPY TIME:  Fluoroscopy Time:  3 minutes 32nd  Radiation Exposure Index (if provided by the fluoroscopic device): Number of Acquired Spot Images: 0 FINDINGS: Water-soluble contrast was placed into the stomach through the NG tube. This reflux is into the esophagus. There is spasm in the distal esophagus. Little to no esophageal and gastric peristalsis. There has been partial proximal gastrectomy with apparent partial gastric pull-through. The gastroesophageal anastomosis is noted in the lower chest. To placing the patient in the lateral position, there is irregular contrast collection noted distal to the distal esophagus at what appears to be the anastomosis concerning for mild anastomotic leak. Stomach empties.  Duodenal sweep appears unremarkable. IMPRESSION: Concern for mild anastomotic leak posteriorly at the esophagogastric anastomosis, best seen on the lateral views. Electronically Signed   By: Rolm Baptise M.D.   On: 10/08/2018 15:28    Anti-infectives: Anti-infectives (From admission, onward)   Start     Dose/Rate Route Frequency Ordered Stop   10/09/18 1800  vancomycin (VANCOCIN) 1,500 mg in sodium chloride 0.9 % 500 mL IVPB     1,500 mg 250 mL/hr over 120 Minutes Intravenous Every 24 hours 10/08/18 1648     10/09/18 0200  piperacillin-tazobactam (ZOSYN) IVPB 3.375 g     3.375 g 12.5 mL/hr over 240 Minutes Intravenous Every 8 hours 10/08/18 1648     10/08/18 1700  vancomycin (VANCOCIN) 1,500 mg in sodium chloride 0.9 % 500 mL IVPB     1,500 mg 250 mL/hr over 120 Minutes Intravenous  Once 10/08/18 1648 10/08/18 1924   10/08/18 1645  piperacillin-tazobactam (ZOSYN) IVPB  3.375 g     3.375 g 100 mL/hr over 30 Minutes Intravenous  Once 10/08/18 1638 10/08/18 1749   10/01/18 1600  ceFAZolin (ANCEF) IVPB 2g/100 mL premix     2 g 200 mL/hr over 30 Minutes Intravenous Every 8 hours 10/01/18 1539 10/01/18 1759   10/01/18 0631  ceFAZolin (ANCEF) 2-4 GM/100ML-% IVPB    Note to Pharmacy:  Marga Melnick   : cabinet override      10/01/18 0631  10/01/18 0814   10/01/18 0630  ceFAZolin (ANCEF) IVPB 2g/100 mL premix     2 g 200 mL/hr over 30 Minutes Intravenous On call to O.R. 10/01/18 7034 10/01/18 0352      Assessment/Plan: s/p Procedure(s): LAPAROSCOPY DIAGNOSTIC (N/A) JEJUNOSTOMY TUBE PLACEMENT (N/A) PYLOROPLASTY  (N/A) OPEN PROXIMAL GASTRECTOMY  OPERATIVE ULTRASOUND  ABL anemia -  Fever - improved.    UGI shows probable small leak.  Keep ngt and npo.  Full tube feeds.  IV antibiotics.   Urinating after foley removal.   nutrition consult - tube feeds at goal. Increase lantus for DM.  Ok for LIQUID MEDS ONLY via J tube.  No crushed meds.   Low dose Metoprolol for HTN/HR via J tube   Moderate protein calorie malnutrition. OOB, pulm toilet  Lovenox.   Oxycodone via J tube prn for addl pain control standing.   Low dose ativan PRN for anxiety  Transfer to stepdown.  Stark Klein  MD, FACS Surgical Oncology, General Surgery, Trauma and [place] [facility], [place]    LOS: 8 days    Stark Klein 10/09/2018

## 2018-10-09 NOTE — Progress Notes (Signed)
Inpatient Diabetes Program Recommendations  AACE/ADA: New Consensus Statement on Inpatient Glycemic Control (2015)  Target Ranges:  Prepandial:   less than 140 mg/dL      Peak postprandial:   less than 180 mg/dL (1-2 hours)      Critically ill patients:  140 - 180 mg/dL   Lab Results  Component Value Date   GLUCAP 263 (H) 10/09/2018   HGBA1C 5.8 (H) 09/25/2018    Review of Glycemic Control Results for Tristan Hughes, Tristan Hughes "NORM" (MRN [id number]) as of 10/09/2018 16:07  Ref. Range 10/08/2018 20:09 10/08/2018 23:22 10/09/2018 03:35 10/09/2018 08:11 10/09/2018 11:40  Glucose-Capillary Latest Ref Range: 70 - 99 mg/dL 203 (H) 222 (H) 221 (H) 286 (H) 263 (H)    Current orders for Inpatient glycemic control:  Novolog resistant q 4 hours, Lantus 35 units daily  Inpatient Diabetes Program Recommendations:    May consider adding tube feed coverage- Novolog 4 units q 4 hours.   Thanks, Adah Perl, RN, BC-ADM Inpatient Diabetes Coordinator Pager [phone]

## 2018-10-09 NOTE — Evaluation (Signed)
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 Pt PMH including Anxiety, CAD (07/09/2009), Depression, Diabetes, HTN, OSA.  Clinical Impression  Pt admitted with above. Despite significant lethargy pt able to follow commands and get up OOB to chair today. Ambulation limited by orders for NG tube to remain hooked up to wall suction. Pt did complete std pvt and 10 marches in place. Anticipate pt will progress well once lines are minimized. Acute PT to cont to follow.    Follow Up Recommendations Home health PT;Supervision/Assistance - 24 hour    Equipment Recommendations  Rolling walker with 5" wheels;3in1 (PT)    Recommendations for Other Services       Precautions / Restrictions Precautions Precautions: Fall Precaution Comments: lots of abdominal incisions Restrictions Weight Bearing Restrictions: No      Mobility  Bed Mobility Overal bed mobility: Needs Assistance Bed Mobility: Rolling;Sidelying to Sit Rolling: Mod assist;+2 for physical assistance Sidelying to sit: Min assist;Mod assist;+2 for physical assistance       General bed mobility comments: educated on log roll technique, modA to roll completely on side, verbal cues to push up onto R elbow and with L hand, modA for trunk elevation and to scoot forward to EOB with feet on the floor  Transfers Overall transfer level: Needs assistance Equipment used: 2 person hand held assist Transfers: Sit to/from [facility] Sit to Stand: Mod assist;+2 physical assistance;+2 safety/equipment Stand pivot transfers: Mod assist;+2 physical assistance;+2 safety/equipment       General transfer  comment: pt held onto pillow over abdomen, used rocking momentum and encouraged pt to push up through LEs, modAx2 to power up and steady during transition of hands to RW. pt then able to take a few steps to chair, modA for walker management. pt then able to complete standing marching in place x 10 reps  Ambulation/Gait             General Gait Details: unable to ambulate as order to keep NG tube hooked up to wall suction. will need an order to temporarily clamp NG tube to progress ambulation  Stairs            Wheelchair Mobility    Modified Rankin (Stroke Patients Only)       Balance Overall balance assessment: Needs assistance Sitting-balance support: Feet supported;No upper extremity supported Sitting balance-Leahy Scale: Fair     Standing balance support: Bilateral upper extremity supported Standing balance-Leahy Scale: Poor Standing balance comment: dependent on physical assist and RW                             Pertinent Vitals/Pain Pain Assessment: 0-10 Pain Score: 2  Pain Location: abdomen Pain Descriptors / Indicators: Sore Pain Intervention(s): Monitored during session    Home Living Family/patient expects to be discharged to:: Private residence Living Arrangements: Spouse/significant other Available Help at Discharge: Family Type of Home: House Home Access: Stairs to enter Entrance Stairs-Rails: Psychiatric nurse of Steps: 2 Home Layout: Two level;Able to live on main level with bedroom/bathroom Home Equipment: None      Prior Function Level of Independence: Independent         Comments: driving, retired  Hand Dominance        Extremity/Trunk Assessment   Upper Extremity Assessment Upper Extremity Assessment: Generalized weakness    Lower Extremity Assessment Lower Extremity Assessment: Generalized weakness    Cervical / Trunk Assessment Cervical / Trunk Assessment: Other exceptions Cervical /  Trunk Exceptions: large abdominal surgery  Communication   Communication: No difficulties(slow muffled speech due to lethargy)  Cognition Arousal/Alertness: Lethargic Behavior During Therapy: Flat affect Overall Cognitive Status: Within Functional Limits for tasks assessed                                 General Comments: pt able to follow commands and appropriate however very lethargic and required freq v/c's to maintain eyes open, pt states "I am just so tired because I can't eat and drink normally" Pt reported he slept well over night he just can't eat properly      General Comments General comments (skin integrity, edema, etc.): pt with multiple abdominal incisions, per RN pt with a leak at an anastomosis    Exercises General Exercises - Lower Extremity Hip Flexion/Marching: AROM;Both;10 reps;Standing   Assessment/Plan    PT Assessment Patient needs continued PT services  PT Problem List Decreased strength;Decreased range of motion;Decreased activity tolerance;Decreased balance;Decreased mobility;Decreased coordination;Decreased knowledge of use of DME       PT Treatment Interventions DME instruction;Gait training;Functional mobility training;Stair training;Therapeutic activities;Therapeutic exercise;Balance training;Neuromuscular re-education;Cognitive remediation    PT Goals (Current goals can be found in the Care Plan section)  Acute Rehab PT Goals Patient Stated Goal: get better PT Goal Formulation: With patient Time For Goal Achievement: 10/23/18 Potential to Achieve Goals: Good    Frequency Min 3X/week   Barriers to discharge        Co-evaluation               AM-PAC PT "6 Clicks" Mobility  Outcome Measure Help needed turning from your back to your side while in a flat bed without using bedrails?: A Lot Help needed moving from lying on your back to sitting on the side of a flat bed without using bedrails?: A Lot Help needed moving to and  from a bed to a chair (including a wheelchair)?: A Lot Help needed standing up from a chair using your arms (e.g., wheelchair or bedside chair)?: A Lot Help needed to walk in hospital room?: A Lot Help needed climbing 3-5 steps with a railing? : A Lot 6 Click Score: 12    End of Session Equipment Utilized During Treatment: Oxygen(on 6LO2 via [place]) Activity Tolerance: Patient tolerated treatment well Patient left: in chair;with call bell/phone within reach;with chair alarm set Nurse Communication: Mobility status PT Visit Diagnosis: Unsteadiness on feet (R26.81);Difficulty in walking, not elsewhere classified (R26.2)    Time: 3500-9381 PT Time Calculation (min) (ACUTE ONLY): 28 min   Charges:   PT Evaluation $PT Eval Moderate Complexity: 1 Mod          Tristan Hughes, PT, DPT Acute Rehabilitation Services Pager #: [phone] Office #: [phone]   Tristan Hughes 10/09/2018, 10:58 AM

## 2018-10-09 NOTE — Progress Notes (Signed)
Patient output from right abdominal JP drain >419mL over the course of 4 hours and sanguineous. On-call CCS MD notified, verbal order for CBC. Patient assessment otherwise unchanged, will continue to monitor.  Candy Sledge, RN

## 2018-10-10 ENCOUNTER — Inpatient Hospital Stay (HOSPITAL_COMMUNITY): Payer: Medicare Other

## 2018-10-10 LAB — GLUCOSE, CAPILLARY
Glucose-Capillary: 213 mg/dL — ABNORMAL HIGH (ref 70–99)
Glucose-Capillary: 233 mg/dL — ABNORMAL HIGH (ref 70–99)
Glucose-Capillary: 238 mg/dL — ABNORMAL HIGH (ref 70–99)
Glucose-Capillary: 248 mg/dL — ABNORMAL HIGH (ref 70–99)
Glucose-Capillary: 257 mg/dL — ABNORMAL HIGH (ref 70–99)
Glucose-Capillary: 262 mg/dL — ABNORMAL HIGH (ref 70–99)
Glucose-Capillary: 265 mg/dL — ABNORMAL HIGH (ref 70–99)

## 2018-10-10 LAB — CBC
HCT: 23.1 % — ABNORMAL LOW (ref 39.0–52.0)
Hemoglobin: 7.2 g/dL — ABNORMAL LOW (ref 13.0–17.0)
MCH: 29.1 pg (ref 26.0–34.0)
MCHC: 31.2 g/dL (ref 30.0–36.0)
MCV: 93.5 fL (ref 80.0–100.0)
Platelets: 444 10*3/uL — ABNORMAL HIGH (ref 150–400)
RBC: 2.47 MIL/uL — ABNORMAL LOW (ref 4.22–5.81)
RDW: 15 % (ref 11.5–15.5)
WBC: 17.6 10*3/uL — ABNORMAL HIGH (ref 4.0–10.5)
nRBC: 2.3 % — ABNORMAL HIGH (ref 0.0–0.2)

## 2018-10-10 LAB — BASIC METABOLIC PANEL
Anion gap: 10 (ref 5–15)
BUN: 21 mg/dL (ref 8–23)
CO2: 28 mmol/L (ref 22–32)
Calcium: 8 mg/dL — ABNORMAL LOW (ref 8.9–10.3)
Chloride: 109 mmol/L (ref 98–111)
Creatinine, Ser: 1.01 mg/dL (ref 0.61–1.24)
GFR calc Af Amer: >60 mL/min (ref >60)
GFR calc non Af Amer: >60 mL/min (ref >60)
Glucose, Bld: 292 mg/dL — ABNORMAL HIGH (ref 70–99)
Potassium: 4 mmol/L (ref 3.5–5.1)
Sodium: 147 mmol/L — ABNORMAL HIGH (ref 135–145)

## 2018-10-10 MED ORDER — INSULIN GLARGINE 100 UNIT/ML ~~LOC~~ SOLN
40.0000 [IU] | Freq: Every day | SUBCUTANEOUS | Status: DC
  Administered 2018-10-10 – 2018-10-15 (×6): 40 [IU] via SUBCUTANEOUS
  Filled 2018-10-10 (×6): qty 0.4

## 2018-10-10 MED ORDER — IOHEXOL 300 MG/ML  SOLN
100.0000 mL | Freq: Once | INTRAMUSCULAR | Status: AC | PRN
  Administered 2018-10-10: 100 mL via INTRAVENOUS

## 2018-10-10 MED ORDER — HYDROMORPHONE HCL 1 MG/ML IJ SOLN
0.5000 mg | INTRAMUSCULAR | Status: DC | PRN
  Administered 2018-10-10 – 2018-10-15 (×7): 0.5 mg via INTRAVENOUS
  Filled 2018-10-10 (×7): qty 1

## 2018-10-10 NOTE — Progress Notes (Signed)
Called by Dr Marlou Starks regarding CT reports.  NG tube was placed during surgery and location confirmed intraop  Will place order for IR to evaluate for perc drain placement in LUQ Discussed with Dr Michaelene Song. Redmond Pulling, MD, FACS General, Bariatric, & Minimally Invasive Surgery [facility], [place]

## 2018-10-10 NOTE — Progress Notes (Signed)
Nutrition Follow-up   RD working remotely.  DOCUMENTATION CODES:   Not applicable  INTERVENTION:  Continue Pivot 1.5 formula via J-tube at goal rate of 65 ml/hr.   Tube feeding to provide 2340 kcal, 146 grams of protein, and 1186 ml free water.   NUTRITION DIAGNOSIS:   Increased nutrient needs related to cancer and cancer related treatments as evidenced by estimated needs; ongoing  GOAL:   Patient will meet greater than or equal to 90% of their needs; met with TF  MONITOR:   TF tolerance, Labs  REASON FOR ASSESSMENT:   Consult Enteral/tube feeding initiation and management  ASSESSMENT:   Pt with PMH of HTN, HLD, GERD, DM, CAD and gastric cancer dx 03/2018 s/p chemo (FOLFOX) who is now s/p diagnostic laparoscopy, proximal gastrectomy with esophagogastrostomy, pyloroplasty, jejunostomy tube placement, intraoperative ultrasound, distal pancreatectomy, and splenectomy on 6/2.   Per MD note, UGI shows probable small leak. NGT in place with 150 ml output. Pt remains NPO. Tube feeding via J-tube reached goal yesterday and has been tolerating it well. RD to continue with current feeding orders. Labs and medications reviewed.   Diet Order:   Diet Order            Diet NPO time specified  Diet effective now              EDUCATION NEEDS:   No education needs have been identified at this time  Skin:  Skin Assessment: Reviewed RN Assessment  Last BM:  6/11  Height:   Ht Readings from Last 1 Encounters:  10/01/18 '5\' 9"'$  (1.753 m)    Weight:   Wt Readings from Last 1 Encounters:  10/10/18 97.5 kg    Ideal Body Weight:  72.7 kg  BMI:  Body mass index is 31.74 kg/m.  Estimated Nutritional Needs:   Kcal:  9826-4158  Protein:  120-135 grams  Fluid:  >2 L/day    Corrin Parker, MS, RD, LDN Pager # [phone] After hours/ weekend pager # [phone]

## 2018-10-10 NOTE — Progress Notes (Signed)
9 Days Post-Op   Subjective/Chief Complaint: OOB.  Had another fever last night.  Getting pretty sleepy from PCA.    Objective: Vital signs in last 24 hours: Temp:  [98.2 F (36.8 C)-101.6 F (38.7 C)] 99.4 F (37.4 C) (06/11 1200) Pulse Rate:  [84-102] 95 (06/11 1200) Resp:  [14-34] 14 (06/11 1200) BP: (129-150)/(56-79) 149/72 (06/11 1200) SpO2:  [89 %-97 %] 96 % (06/11 1200) Weight:  [97.5 kg] 97.5 kg (06/11 0400) Last BM Date: 10/10/18  Intake/Output from previous day: 06/10 0701 - 06/11 0700 In: 1662.1 [I.V.:130.2; NG/GT:1495; IV Piggyback:36.9] Out: 6568 [Urine:1350; Emesis/NG output:150; Drains:90] Intake/Output this shift: Total I/O In: -  Out: 550 [Urine:550]   General appearance: OOB, a little sleepy.   Resp: breathing comfortably Cardio: regular rate and rhythm GI: soft, mild distended, approp tender.  Old blood. Incision c/d/i no cellulitis   Extremities: extremities normal, atraumatic, no cyanosis or edema  Lab Results:  Recent Labs    10/09/18 0427 10/10/18 0428  WBC 18.3* 17.6*  HGB 7.4* 7.2*  HCT 23.7* 23.1*  PLT 358 444*   BMET Recent Labs    10/09/18 0427 10/10/18 0428  NA 144 147*  K 3.7 4.0  CL 106 109  CO2 29 28  GLUCOSE 251* 292*  BUN 20 21  CREATININE 0.83 1.01  CALCIUM 7.9* 8.0*   PT/INR No results for input(s): LABPROT, INR in the last 72 hours. ABG No results for input(s): PHART, HCO3 in the last 72 hours.  Invalid input(s): PCO2, PO2  Studies/Results: Dg Ugi W Single Cm (sol Or Thin Ba)  Result Date: 10/08/2018 CLINICAL DATA:  Recent partial gastrectomy for gastric cancer. Evaluate for postoperative leak. EXAM: WATER SOLUBLE UPPER GI SERIES TECHNIQUE: Single-column upper GI series was performed using water soluble contrast. CONTRAST:  158mL OMNIPAQUE IOHEXOL 300 MG/ML  SOLN COMPARISON:  Upper GI 10/06/2018 FLUOROSCOPY TIME:  Fluoroscopy Time:  3 minutes 32nd Radiation Exposure Index (if provided by the fluoroscopic device):  Number of Acquired Spot Images: 0 FINDINGS: Water-soluble contrast was placed into the stomach through the NG tube. This reflux is into the esophagus. There is spasm in the distal esophagus. Little to no esophageal and gastric peristalsis. There has been partial proximal gastrectomy with apparent partial gastric pull-through. The gastroesophageal anastomosis is noted in the lower chest. To placing the patient in the lateral position, there is irregular contrast collection noted distal to the distal esophagus at what appears to be the anastomosis concerning for mild anastomotic leak. Stomach empties.  Duodenal sweep appears unremarkable. IMPRESSION: Concern for mild anastomotic leak posteriorly at the esophagogastric anastomosis, best seen on the lateral views. Electronically Signed   By: Rolm Baptise M.D.   On: 10/08/2018 15:28    Anti-infectives: Anti-infectives (From admission, onward)   Start     Dose/Rate Route Frequency Ordered Stop   10/09/18 1800  vancomycin (VANCOCIN) 1,500 mg in sodium chloride 0.9 % 500 mL IVPB     1,500 mg 250 mL/hr over 120 Minutes Intravenous Every 24 hours 10/08/18 1648     10/09/18 0200  piperacillin-tazobactam (ZOSYN) IVPB 3.375 g     3.375 g 12.5 mL/hr over 240 Minutes Intravenous Every 8 hours 10/08/18 1648     10/08/18 1700  vancomycin (VANCOCIN) 1,500 mg in sodium chloride 0.9 % 500 mL IVPB     1,500 mg 250 mL/hr over 120 Minutes Intravenous  Once 10/08/18 1648 10/08/18 1924   10/08/18 1645  piperacillin-tazobactam (ZOSYN) IVPB 3.375 g  3.375 g 100 mL/hr over 30 Minutes Intravenous  Once 10/08/18 1638 10/08/18 1749   10/01/18 1600  ceFAZolin (ANCEF) IVPB 2g/100 mL premix     2 g 200 mL/hr over 30 Minutes Intravenous Every 8 hours 10/01/18 1539 10/01/18 1759   10/01/18 0631  ceFAZolin (ANCEF) 2-4 GM/100ML-% IVPB    Note to Pharmacy: Marga Melnick   : cabinet override      10/01/18 0631 10/01/18 0814   10/01/18 0630  ceFAZolin (ANCEF) IVPB 2g/100 mL  premix     2 g 200 mL/hr over 30 Minutes Intravenous On call to O.R. 10/01/18 2080 10/01/18 2233      Assessment/Plan: s/p Procedure(s): LAPAROSCOPY DIAGNOSTIC (N/A) JEJUNOSTOMY TUBE PLACEMENT (N/A) PYLOROPLASTY  (N/A) OPEN PROXIMAL GASTRECTOMY  OPERATIVE ULTRASOUND  ABL anemia -  Fever - improved.    UGI shows probable small leak.  Keep ngt and npo.  Full tube feeds.  IV antibiotics.   Urinating after foley removal.   nutrition consult - tube feeds at goal. Increase lantus for DM.  Ok for LIQUID MEDS ONLY via J tube.  No crushed meds.   Low dose Metoprolol for HTN/HR via J tube   Moderate protein calorie malnutrition. OOB, pulm toilet  Lovenox.   Oxycodone via J tube prn for addl pain control standing.   Low dose ativan PRN for anxiety  Continue stepdown.  CTs today to evaluate for any undrained fluid collection.    Stark Klein  MD, FACS Surgical Oncology, General Surgery, Trauma and [facility] [place] [facility], [place]    LOS: 9 days    Stark Klein 10/10/2018

## 2018-10-10 NOTE — Progress Notes (Signed)
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 Pt PMH including Anxiety, CAD (07/09/2009), Depression, Diabetes, HTN, OSA.    PT Comments    Patient seen for mobility progression - per nursing PCA has been discontinued. Patient agreeable to participate with PT today. Good tolerance to sitting ther-ex, however patient reporting fatigue limiting. Transfers with Mod A+2 with use of RW - most difficulty rising from recliner.  Will continue to progress as tolerated - hopeful to progress ambulation at next visit.    Follow Up Recommendations  Home health PT;Supervision/Assistance - 24 hour     Equipment Recommendations  Rolling walker with 5" wheels;3in1 (PT)    Recommendations for Other Services       Precautions / Restrictions Precautions Precautions: Fall Precaution Comments: lots of abdominal incisions, drains, peg tube, NG tube Restrictions Weight Bearing Restrictions: No    Mobility  Bed Mobility Overal bed mobility: Needs Assistance Bed Mobility: Rolling;Sit to Sidelying Rolling: Mod assist       Sit to sidelying: Mod assist;+2 for physical assistance General bed mobility comments: log rolling to reduce pain/strain at abdomen  Transfers Overall transfer level: Needs assistance Equipment used: Rolling walker (2 wheeled) Transfers: Sit to/from [facility] Sit to Stand: Mod assist;+2 physical assistance;+2 safety/equipment Stand pivot transfers: Mod assist;+2 safety/equipment       General transfer comment: Mod A +2 to stand from recliner - cueing for hand placement on RW once standing; Mod A for pivot transfer with cueing for  safety  Ambulation/Gait             General Gait Details: unable to ambulate as order to keep NG tube hooked up to wall suction. will need an order to temporarily clamp NG tube to progress ambulation   Stairs             Wheelchair Mobility    Modified Rankin (Stroke Patients Only)       Balance Overall balance assessment: Needs assistance Sitting-balance support: Feet supported;No upper extremity supported Sitting balance-Leahy Scale: Fair     Standing balance support: Bilateral upper extremity supported;During functional activity Standing balance-Leahy Scale: Poor Standing balance comment: dependent on physical assist and RW                            Cognition Arousal/Alertness: Awake/alert Behavior During Therapy: Flat affect Overall Cognitive Status: Within Functional Limits for tasks assessed                                        Exercises General Exercises - Lower Extremity Ankle Circles/Pumps: AROM;Both;10 reps;Seated Long Arc Quad: AROM;Both;10 reps;Seated Hip Flexion/Marching: AROM;Both;10 reps;Seated    General Comments        Pertinent Vitals/Pain Pain Assessment: Faces Faces Pain Scale: Hurts a little bit Pain Location: abdomen Pain Descriptors / Indicators: Discomfort;Grimacing Pain Intervention(s): Limited activity within patient's tolerance;Monitored during session;Repositioned    Home Living                      Prior Function            PT Goals (  current goals can now be found in the care plan section) Acute Rehab PT Goals Patient Stated Goal: get better PT Goal Formulation: With patient Time For Goal Achievement: 10/23/18 Potential to Achieve Goals: Good Progress towards PT goals: Progressing toward goals    Frequency    Min 3X/week      PT Plan Current plan remains appropriate    Co-evaluation              AM-PAC PT "6 Clicks" Mobility   Outcome Measure  Help  needed turning from your back to your side while in a flat bed without using bedrails?: A Lot Help needed moving from lying on your back to sitting on the side of a flat bed without using bedrails?: A Lot Help needed moving to and from a bed to a chair (including a wheelchair)?: A Lot Help needed standing up from a chair using your arms (e.g., wheelchair or bedside chair)?: A Lot Help needed to walk in hospital room?: A Lot Help needed climbing 3-5 steps with a railing? : A Lot 6 Click Score: 12    End of Session Equipment Utilized During Treatment: Oxygen Activity Tolerance: Patient tolerated treatment well Patient left: in bed;with call bell/phone within reach;with nursing/sitter in room Nurse Communication: Mobility status PT Visit Diagnosis: Unsteadiness on feet (R26.81);Difficulty in walking, not elsewhere classified (R26.2)     Time: 7096-2836 PT Time Calculation (min) (ACUTE ONLY): 21 min  Charges:  $Therapeutic Activity: 8-22 mins                      Lanney Gins, PT, DPT Supplemental Physical Therapist 10/10/18 3:34 PM Pager: [phone] Office: [phone]

## 2018-10-11 ENCOUNTER — Inpatient Hospital Stay (HOSPITAL_COMMUNITY): Payer: Medicare Other

## 2018-10-11 LAB — GLUCOSE, CAPILLARY
Glucose-Capillary: 234 mg/dL — ABNORMAL HIGH (ref 70–99)
Glucose-Capillary: 225 mg/dL — ABNORMAL HIGH (ref 70–99)
Glucose-Capillary: 134 mg/dL — ABNORMAL HIGH (ref 70–99)
Glucose-Capillary: 162 mg/dL — ABNORMAL HIGH (ref 70–99)
Glucose-Capillary: 170 mg/dL — ABNORMAL HIGH (ref 70–99)
Glucose-Capillary: 195 mg/dL — ABNORMAL HIGH (ref 70–99)

## 2018-10-11 LAB — VANCOMYCIN, PEAK: Vancomycin Pk: 19 ug/mL — ABNORMAL LOW (ref 30–40)

## 2018-10-11 LAB — CBC
HCT: 22.3 % — ABNORMAL LOW (ref 39.0–52.0)
Hemoglobin: 7.1 g/dL — ABNORMAL LOW (ref 13.0–17.0)
MCH: 29.3 pg (ref 26.0–34.0)
MCHC: 31.8 g/dL (ref 30.0–36.0)
MCV: 92.1 fL (ref 80.0–100.0)
Platelets: 457 10*3/uL — ABNORMAL HIGH (ref 150–400)
RBC: 2.42 MIL/uL — ABNORMAL LOW (ref 4.22–5.81)
RDW: 15.1 % (ref 11.5–15.5)
WBC: 15.5 10*3/uL — ABNORMAL HIGH (ref 4.0–10.5)
nRBC: 2.6 % — ABNORMAL HIGH (ref 0.0–0.2)

## 2018-10-11 LAB — PROTIME-INR
INR: 1.2 (ref 0.8–1.2)
Prothrombin Time: 14.7 seconds (ref 11.4–15.2)

## 2018-10-11 LAB — BASIC METABOLIC PANEL
Anion gap: 9 (ref 5–15)
BUN: 19 mg/dL (ref 8–23)
CO2: 28 mmol/L (ref 22–32)
Calcium: 8 mg/dL — ABNORMAL LOW (ref 8.9–10.3)
Chloride: 109 mmol/L (ref 98–111)
Creatinine, Ser: 0.88 mg/dL (ref 0.61–1.24)
GFR calc Af Amer: >60 mL/min (ref >60)
GFR calc non Af Amer: >60 mL/min (ref >60)
Glucose, Bld: 240 mg/dL — ABNORMAL HIGH (ref 70–99)
Potassium: 3.9 mmol/L (ref 3.5–5.1)
Sodium: 146 mmol/L — ABNORMAL HIGH (ref 135–145)

## 2018-10-11 LAB — VANCOMYCIN, TROUGH: Vancomycin Tr: 7 ug/mL — ABNORMAL LOW (ref 15–20)

## 2018-10-11 NOTE — Progress Notes (Signed)
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 Pt PMH including Anxiety, CAD (07/09/2009), Depression, Diabetes, HTN, OSA.    PT Comments     Patient progressing slowly towards PT goals. Requires Mod A of 2 for all mobility and transfers. Able to take a few steps to chair with Mod A of 2 due to impulsivity and difficulty with RW management/safety. Reports feeling dizzy with all movements but VSS throughout with BP in 140s/90s and Sp02 remained >92% on RA. Pt with poor safety awareness and decreased insight into deficits. Discharge recommendation updated to CIR as pt will likely need more rehab prior to returning home. Will follow.   Follow Up Recommendations  CIR;Supervision for mobility/OOB;Supervision/Assistance - 24 hour     Equipment Recommendations  Rolling walker with 5" wheels;3in1 (PT)    Recommendations for Other Services       Precautions / Restrictions Precautions Precautions: Other (comment);Fall Precaution Comments: lots of abdominal incisions, drains, peg tube, NG tube Restrictions Weight Bearing Restrictions: No    Mobility  Bed Mobility Overal bed mobility: Needs Assistance Bed Mobility: Rolling;Sidelying to Sit Rolling: Mod assist;+2 for physical assistance;+2 for safety/equipment Sidelying to sit: Mod assist;+2 for physical assistance;+2 for safety/equipment       General bed mobility comments: verbal and manual cues for pt to reach with LUE towards bedrail for modified log roll; assist for trunk elevation and to scoot hips.+ dizziness. BP stable 140s/90s  Transfers Overall transfer level: Needs assistance Equipment used: Rolling walker  (2 wheeled) Transfers: Sit to/from Stand Sit to Stand: Mod assist;+2 physical assistance;+2 safety/equipment         General transfer comment: Assist to power to standing with cues for hand placement/technique. Unable to stand without assist. Cues for safety as pt impulsive with all mobility.  Ambulation/Gait Ambulation/Gait assistance: Mod assist;+2 physical assistance;+2 safety/equipment Gait Distance (Feet): 10 Feet Assistive device: Rolling walker (2 wheeled) Gait Pattern/deviations: Step-to pattern;Trunk flexed Gait velocity: unsafe pace   General Gait Details: Despite being told to ambulate in forward fashion, pt impulsively side stepping towards chair leaving RW behind requiring assist for balance, RW management and safety.   Stairs             Wheelchair Mobility    Modified Rankin (Stroke Patients Only)       Balance Overall balance assessment: Needs assistance Sitting-balance support: Feet supported;No upper extremity supported Sitting balance-Leahy Scale: Fair     Standing balance support: Bilateral upper extremity supported;During functional activity Standing balance-Leahy Scale: Poor Standing balance comment: dependent on physical assist and RW. + dizziness in standing. BP and other VSS stable.                            Cognition Arousal/Alertness: Awake/alert Behavior During Therapy: Impulsive;WFL for tasks assessed/performed Overall Cognitive Status: No family/caregiver present to determine baseline cognitive functioning                                 General Comments: pt pleasant but somewhat impulsive with mobility; appears to have some decreased insight into current deficits and need for assist.      Exercises  General Comments General comments (skin integrity, edema, etc.): Sp02 remained >92% on RA throughout session despite pt stating dizziness. BP stable as well- remained 140s/90s. Pt coughing up secretions  post transfer, needs help using yanker.      Pertinent Vitals/Pain Pain Assessment: Faces Faces Pain Scale: Hurts a little bit Pain Location: abdomen Pain Descriptors / Indicators: Discomfort;Grimacing Pain Intervention(s): Monitored during session;Repositioned    Home Living                      Prior Function            PT Goals (current goals can now be found in the care plan section) Acute Rehab PT Goals Patient Stated Goal: get better; wants to go home Progress towards PT goals: Progressing toward goals(slowly)    Frequency    Min 3X/week      PT Plan Discharge plan needs to be updated    Co-evaluation PT/OT/SLP Co-Evaluation/Treatment: Yes Reason for Co-Treatment: To address functional/ADL transfers;Necessary to address cognition/behavior during functional activity;Complexity of the patient's impairments (multi-system involvement);For patient/therapist safety PT goals addressed during session: Mobility/safety with mobility;Balance;Proper use of DME OT goals addressed during session: ADL's and self-care;Proper use of Adaptive equipment and DME      AM-PAC PT "6 Clicks" Mobility   Outcome Measure  Help needed turning from your back to your side while in a flat bed without using bedrails?: A Lot Help needed moving from lying on your back to sitting on the side of a flat bed without using bedrails?: A Lot Help needed moving to and from a bed to a chair (including a wheelchair)?: A Lot Help needed standing up from a chair using your arms (e.g., wheelchair or bedside chair)?: A Lot Help needed to walk in hospital room?: A Lot Help needed climbing 3-5 steps with a railing? : A Lot 6 Click Score: 12    End of Session Equipment Utilized During Treatment: Gait belt Activity Tolerance: Patient tolerated treatment well;Patient limited by fatigue Patient left: in chair;with call bell/phone within reach;with chair alarm set Nurse Communication: Mobility  status PT Visit Diagnosis: Unsteadiness on feet (R26.81);Difficulty in walking, not elsewhere classified (R26.2);Pain Pain - part of body: (abdomen)     Time: 1275-1700 PT Time Calculation (min) (ACUTE ONLY): 32 min  Charges:  $Therapeutic Activity: 8-22 mins                     Wray Kearns, PT, DPT Acute Rehabilitation Services Pager [phone] Office [place] 10/11/2018, 4:00 PM

## 2018-10-11 NOTE — Progress Notes (Signed)
Inpatient Diabetes Program Recommendations  AACE/ADA: New Consensus Statement on Inpatient Glycemic Control (2015)  Target Ranges:  Prepandial:   less than 140 mg/dL      Peak postprandial:   less than 180 mg/dL (1-2 hours)      Critically ill patients:  140 - 180 mg/dL   Results for Tristan Hughes, Tristan "NORM" (MRN [id number]) as of 10/11/2018 11:45  Ref. Range 10/10/2018 00:37 10/10/2018 04:16 10/10/2018 07:24 10/10/2018 11:39 10/10/2018 15:45 10/10/2018 19:30  Glucose-Capillary Latest Ref Range: 70 - 99 mg/dL 248 (H)  7 units NOVOLOG  257 (H)  11 units NOVOLOG 233 (H)  7 units NOVOLOG 265 (H)  11 units NOVOLOG +  40 units LANTUS 262 (H)  11 units NOVOLOG 213 (H)  7 units NOVOLOG   Results for Tristan Hughes, Tristan N "NORM" (MRN [id number]) as of 10/11/2018 11:45  Ref. Range 10/10/2018 23:52 10/11/2018 03:55 10/11/2018 07:51  Glucose-Capillary Latest Ref Range: 70 - 99 mg/dL 238 (H)  7 units NOVOLOG 234 (H)  7 units NOVOLOG 225 (H)  7 units NOVOLOG +  40 units LANTUS      Home DM Meds: Lantus 46 units QHS       Humalog 11-23 units TID       Metformin 1000 mg BID  Current Orders: Lantus 40 units Daily      Novolog Resistant Correction Scale/ SSI (0-20 units) Q4 hours      Tube feeds running 65cc/hr.  CBGs consistently >200 mg/dl.    MD- Please consider adding Novolog Tube Feed Coverage:  Novolog 4 units Q4 hours  HOLD if Tube feed HELD for any reason      --Will follow patient during hospitalization--  Wyn Quaker RN, MSN, CDE Diabetes Coordinator Inpatient Glycemic Control Team Team Pager: [phone] (8a-5p)

## 2018-10-11 NOTE — Care Management Important Message (Signed)
Important Message  Patient Details  Name: Tristan Hughes MRN: [id number] Date of Birth: 12-28-1940   Medicare Important Message Given:  Yes    Memory Argue 10/11/2018, 1:30 PM

## 2018-10-11 NOTE — Progress Notes (Signed)
Pt with slow progress towards OT goals; continues to have limitations due to generalized weakness and fatigue with activity. Pt requiring modA+2 for completion of bed mobility, sit<>stand and to take few steps towards recliner. Pt impulsive with mobility requiring cues for safety and for safe RW management. He currently requires totalA for LB ADL. Pt pleasant and with motivation to return to PLOF, but fatigues quickly at this time. Given pt slow progression have updated discharge recommendations. Currently recommend CIR level therapy services at time of discharge to maximize his safety and independence with ADL and mobility. Will continued to follow acutely.     10/11/18 1526  OT Visit Information  Last OT Received On 10/11/18  Assistance Needed +2  PT/OT/SLP Co-Evaluation/Treatment Yes  Reason for Co-Treatment For patient/therapist safety;To address functional/ADL transfers;Complexity of the patient's impairments (multi-system involvement)  OT goals addressed during session ADL's and self-care;Proper use of Adaptive equipment and DME  History of Present Illness Pt is a 78 y/o male s/p Diagnostic laparoscopy, proximal gastrectomy with esophagogastrostomy, pyloroplasty, jejunostomy tube placement, intraoperative ultrasound, distal pancreatectomy, 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 Pt PMH including Anxiety, CAD (07/09/2009), Depression, Diabetes, HTN, OSA.  Precautions  Precautions Fall  Precaution Comments lots of abdominal incisions, drains, peg tube, NG tube  Pain Assessment  Pain Assessment Faces  Faces Pain Scale 2  Pain Location abdomen  Pain Descriptors / Indicators Discomfort;Grimacing  Pain Intervention(s) Monitored during session;Limited activity within patient's tolerance  Cognition  Arousal/Alertness Awake/alert  Behavior During Therapy Impulsive;WFL for tasks assessed/performed  Overall Cognitive Status No family/caregiver present to determine baseline cognitive functioning  General  Comments pt pleasant but somewhat impulsive with mobility; appears to have some decreased insight into current deficits and need for assist   ADL  Overall ADL's  Needs assistance/impaired  Bed Mobility  Overal bed mobility Needs Assistance  Bed Mobility Rolling;Sidelying to Sit  Rolling Mod assist;+2 for physical assistance;+2 for safety/equipment  Sidelying to sit Mod assist;+2 for physical assistance;+2 for safety/equipment  General bed mobility comments VCs for pt to reach with UE towards bedrail for modified log roll; assist for trunk elevation and to scoot hips; + dizziness, BP stable  Balance  Overall balance assessment Needs assistance  Sitting-balance support Feet supported;No upper extremity supported  Sitting balance-Leahy Scale Fair  Standing balance support Bilateral upper extremity supported;During functional activity  Standing balance-Leahy Scale Poor  Standing balance comment dependent on physical assist and RW  Restrictions  Weight Bearing Restrictions No  Transfers  Overall transfer level Needs assistance  Equipment used Rolling walker (2 wheeled)  Transfers Sit to/from Stand  Sit to Stand Mod assist;+2 physical assistance;+2 safety/equipment  General transfer comment boosting assist to RW; VCs for safety as pt with impulsivities with mobility, requires physical assist to manage RW  General Comments  General comments (skin integrity, edema, etc.) pt reports increased dizziness with sitting/standing ,BP stable; pt O2 sats >92% on RA  OT - End of Session  Equipment Utilized During Treatment Gait belt;Rolling walker  Activity Tolerance Patient tolerated treatment well;Patient limited by fatigue  Patient left in chair;with call bell/phone within reach;with chair alarm set  Nurse Communication Mobility status  OT Assessment/Plan  OT Plan Discharge plan needs to be updated  OT Visit Diagnosis Unsteadiness on feet (R26.81);Other abnormalities of gait and mobility  (R26.89);Muscle weakness (generalized) (M62.81)  OT Frequency (ACUTE ONLY) Min 2X/week  Recommendations for Other Services Rehab consult  Follow Up Recommendations CIR;Supervision/Assistance - 24 hour  OT Equipment 3 in 1 bedside  commode  AM-PAC OT "6 Clicks" Daily Activity Outcome Measure (Version 2)  Help from another person eating meals? 1 (NPO)  Help from another person taking care of personal grooming? 3  Help from another person toileting, which includes using toliet, bedpan, or urinal? 2  Help from another person bathing (including washing, rinsing, drying)? 2  Help from another person to put on and taking off regular upper body clothing? 2  Help from another person to put on and taking off regular lower body clothing? 2  6 Click Score 12  OT Goal Progression  Progress towards OT goals Progressing toward goals  Acute Rehab OT Goals  Patient Stated Goal get better; wants to go home  OT Goal Formulation With patient  Time For Goal Achievement 10/23/18  Potential to Achieve Goals Good  ADL Goals  Pt Will Perform Grooming with supervision;standing  Pt Will Perform Upper Body Dressing with modified independence;sitting  Pt Will Perform Lower Body Dressing sit to/from stand;with min assist;with caregiver independent in assisting;with adaptive equipment  Pt Will Transfer to Toilet with supervision  Pt Will Perform Toileting - Clothing Manipulation and hygiene with modified independence;sit to/from stand  Additional ADL Goal #1 Pt will perform bed mobility (log roll technique) at supervision level prior to engaging in ADL  OT Time Calculation  OT Start Time (ACUTE ONLY) 1431  OT Stop Time (ACUTE ONLY) 1503  OT Time Calculation (min) 32 min  OT General Charges  $OT Visit 1 Visit  OT Treatments  $Self Care/Home Management  8-22 mins    Lou Cal, [place] Pager [phone] Office [phone]

## 2018-10-11 NOTE — Progress Notes (Signed)
Called Patient's  Daughter Lou Miner and phone updated. Shamecca Whitebread Ladora Daniel, BSN, RN

## 2018-10-11 NOTE — Progress Notes (Signed)
Called by IR to hold Tube Feed at 0830 for possible Percutaneous drain placement. CT scan abdomen with small amount of contrast through NGT  performed at 1154. Call received from Interventional Radiology to resume TF; that drain placement is not possible today.  Restarted at 1400.

## 2018-10-11 NOTE — Progress Notes (Addendum)
Rehab Admissions Coordinator Note:  Patient was screened by Michel Santee for appropriateness for an Inpatient Acute Rehab Consult.  At this time, we are recommending Inpatient Rehab consult. If you would like patient to be considered, please place an IP Rehab MD Consult order.   Michel Santee 10/11/2018, 4:08 PM  I can be reached at [phone].

## 2018-10-11 NOTE — Progress Notes (Signed)
Pharmacy Antibiotic Note  Tristan Hughes is a 78 y.o. male admitted on 10/01/2018 with concern for mild anastomotic leak posteriorly at the esophagogastric anastomosis. Pharmacy consulted to start Vancomycin & Zosyn  Vancomycin peak reported as 19 mg/L and trough 7 mg/L,  Plan: -Continue zosyn 3.375g IV every 8 hours - Change vancomycin to 2gm IV q24 hours   Height: 5\' 9"  (175.3 cm) Weight: 214 lb 8 oz (97.3 kg) IBW/kg (Calculated) : 70.7  Temp (24hrs), Avg:98.6 F (37 C), Min:98.5 F (36.9 C), Max:98.8 F (37.1 C)  Recent Labs  Lab 10/07/18 0603 10/08/18 0929 10/09/18 0427 10/10/18 0428 10/11/18 0227 10/11/18 1633 10/11/18 2237  WBC 22.5* 21.6* 18.3* 17.6* 15.5*  --   --   CREATININE 0.89 0.85 0.83 1.01 0.88  --   --   VANCOTROUGH  --   --   --   --   --  7*  --   VANCOPEAK  --   --   --   --   --   --  19*    Estimated Creatinine Clearance: 80.8 mL/min (by C-G formula based on SCr of 0.88 mg/dL).    Allergies  Allergen Reactions  . Naproxen Sodium Swelling    SWELLING REACTION UNSPECIFIED     Antimicrobials this admission: Vanc 6/9 >> Zosyn  6/9 >>  Dose adjustments this admission: 6/12  Change vanc to 2gm q24  Microbiology results: 6/8 UCx >> 6/8 BCx >> ngtd  Thank you for allowing pharmacy to be a part of this patient's care.  Excell Seltzer, PharmD

## 2018-10-12 LAB — CBC
HCT: 23.1 % — ABNORMAL LOW (ref 39.0–52.0)
Hemoglobin: 7.2 g/dL — ABNORMAL LOW (ref 13.0–17.0)
MCH: 28.6 pg (ref 26.0–34.0)
MCHC: 31.2 g/dL (ref 30.0–36.0)
MCV: 91.7 fL (ref 80.0–100.0)
Platelets: 584 10*3/uL — ABNORMAL HIGH (ref 150–400)
RBC: 2.52 MIL/uL — ABNORMAL LOW (ref 4.22–5.81)
RDW: 15.2 % (ref 11.5–15.5)
WBC: 14.5 10*3/uL — ABNORMAL HIGH (ref 4.0–10.5)
nRBC: 3.5 % — ABNORMAL HIGH (ref 0.0–0.2)

## 2018-10-12 LAB — BASIC METABOLIC PANEL
Anion gap: 9 (ref 5–15)
BUN: 22 mg/dL (ref 8–23)
CO2: 26 mmol/L (ref 22–32)
Calcium: 7.9 mg/dL — ABNORMAL LOW (ref 8.9–10.3)
Chloride: 111 mmol/L (ref 98–111)
Creatinine, Ser: 0.85 mg/dL (ref 0.61–1.24)
GFR calc Af Amer: >60 mL/min (ref >60)
GFR calc non Af Amer: >60 mL/min (ref >60)
Glucose, Bld: 214 mg/dL — ABNORMAL HIGH (ref 70–99)
Potassium: 4 mmol/L (ref 3.5–5.1)
Sodium: 146 mmol/L — ABNORMAL HIGH (ref 135–145)

## 2018-10-12 LAB — CULTURE, BLOOD (ROUTINE X 2)
Culture: NO GROWTH
Culture: NO GROWTH
Special Requests: ADEQUATE
Special Requests: ADEQUATE

## 2018-10-12 LAB — GLUCOSE, CAPILLARY
Glucose-Capillary: 165 mg/dL — ABNORMAL HIGH (ref 70–99)
Glucose-Capillary: 231 mg/dL — ABNORMAL HIGH (ref 70–99)
Glucose-Capillary: 250 mg/dL — ABNORMAL HIGH (ref 70–99)
Glucose-Capillary: 303 mg/dL — ABNORMAL HIGH (ref 70–99)
Glucose-Capillary: 200 mg/dL — ABNORMAL HIGH (ref 70–99)

## 2018-10-12 MED ORDER — VANCOMYCIN HCL 10 G IV SOLR
2000.0000 mg | INTRAVENOUS | Status: DC
  Administered 2018-10-12 – 2018-10-16 (×5): 2000 mg via INTRAVENOUS
  Filled 2018-10-12 (×8): qty 2000

## 2018-10-12 NOTE — Progress Notes (Signed)
Report called to Percell Locus, RN, 6N receiving Nurse. Patient going to room 6N 20 and made aware. Called Daughter Lou Miner and notified of the transfer. Room number and unit phone number provided. Fabiha Rougeau Ladora Daniel, BSN, RN

## 2018-10-12 NOTE — Progress Notes (Signed)
11 Days Post-Op   Subjective/Chief Complaint: Has been afebrile Very tired Receiving a few ice chips   Objective: Vital signs in last 24 hours: Temp:  [98.5 F (36.9 C)-98.7 F (37.1 C)] 98.7 F (37.1 C) (06/13 0828) Pulse Rate:  [83-90] 86 (06/13 0344) Resp:  [17-25] 19 (06/13 0828) BP: (115-146)/(60-66) 129/61 (06/13 0828) SpO2:  [94 %-99 %] 97 % (06/13 0828) Weight:  [98.1 kg] 98.1 kg (06/13 0400) Last BM Date: 10/10/18  Intake/Output from previous day: 06/12 0701 - 06/13 0700 In: 2425.3 [I.V.:315.6; NG/GT:1025; IV Piggyback:314.7] Out: 1535 [Urine:975; Emesis/NG output:450; Drains:110] Intake/Output this shift: No intake/output data recorded.  General appearance: OOB,awake alert Resp: breathing comfortably Cardio: regular rate and rhythm GI: soft, mild distended, approp tender.  Old blood in drain. Incision c/d/i no cellulitis   Extremities: extremities normal, atraumatic, no cyanosis or edema  Lab Results:  Recent Labs    10/11/18 0227 10/12/18 0512  WBC 15.5* 14.5*  HGB 7.1* 7.2*  HCT 22.3* 23.1*  PLT 457* 584*   BMET Recent Labs    10/11/18 0227 10/12/18 0512  NA 146* 146*  K 3.9 4.0  CL 109 111  CO2 28 26  GLUCOSE 240* 214*  BUN 19 22  CREATININE 0.88 0.85  CALCIUM 8.0* 7.9*   PT/INR Recent Labs    10/11/18 0227  LABPROT 14.7  INR 1.2   ABG No results for input(s): PHART, HCO3 in the last 72 hours.  Invalid input(s): PCO2, PO2  Studies/Results: Ct Abdomen Wo Contrast  Result Date: 10/11/2018 CLINICAL DATA:  78 year old male with a history recent proximal gastrectomy with esophago-gastrostomy, pyloroplasty, jejunostomy tube placement, distal pancreatectomy, and splenectomy EXAM: CT ABDOMEN WITHOUT CONTRAST TECHNIQUE: Multidetector CT imaging of the abdomen was performed following the standard protocol without IV contrast. COMPARISON:  10/10/2018 FINDINGS: Lower chest: Redemonstration left pleural plaques/calcification with partially  loculated pleural fluid on the left, not significantly changed from the comparison CT. Associated atelectasis of the bilateral lung bases. Trace right-sided pleural effusion. There is redemonstration mediastinal gas extending through the hiatus to the level of the surgical anastomosis. There is no evidence of pooling GI/enteric contrast within the mediastinum. Hepatobiliary: Unremarkable liver. Pancreas: Surgical changes of distal pancreatectomy. Unremarkable appearance of the pancreatic head/remnant pancreas. Spleen: Surgical changes of splenectomy Adrenals/Urinary Tract: Unremarkable adrenal glands. No evidence of hydronephrosis of the left or right kidney. Trace perinephric stranding bilaterally. No nephrolithiasis. Stomach/Bowel: Surgical changes of prior proximal gastrectomy and primary anastomosis. The anastomosis is present within the lower mediastinum, just above the GE junction. There is flocculent gas within the lower mediastinum extending through the hiatus, as well as at the site of the surgery in the left upper quadrant, at the splenectomy site and distal pancreatectomy state. There is no evidence of extraluminal contrast adjacent to the stomach or in the left upper quadrant. The fluid collection in the left upper quadrant posteriorly is changed configuration, more elongated configuration, perhaps slightly smaller than the prior to unchanged. Enteric contrast is completely within the lumen of the stomach. The contrast is pooled dependently within the stomach, with no contrast at the pyloric channel. Gastric tube terminates within the lumen of the stomach, beyond the anastomosis and the hiatus. Wall of the stomach not well evaluated given the absence of IV contrast, though there is no gross evidence of disruption. Percutaneous jejunostomy tube in place. Surgical drain from right abdominal approach terminates within the left upper quadrant. No abnormally distended visualized small bowel or colon. Retained  enteric contrast  within the colon which is not distended. Vascular/Lymphatic: Atherosclerosis. Other: Surgical staples along the midline. Decreasing gas in the subcutaneous tissues of the left upper abdomen. Musculoskeletal: No displaced fracture. Degenerative changes of the spine. IMPRESSION: Abdominal CT demonstrates surgical changes of proximal gastrectomy with primary anastomosis, with no evidence of enteric contrast leak/anastomotic leak or stomach wall disruption after administration of PO contrast. The most anti dependent aspects of the stomach are not well evaluated given the layering of the contrast. Gastric tube terminates within the lumen of the stomach. Surgical changes of distal pancreatectomy and splenectomy with persisting flocculent gas/fluid in the left upper quadrant. Leading differential would be infection, with perforation/disruption at the surgical site or stomach not likely given the absence of extraluminal contrast. Unchanged surgical drain. These results were discussed by telephone at the time of interpretation on 10/11/2018 at 1:23 pm with Dr. Stark Klein. Similar appearance of left greater than the right dependent pleural fluid and associated atelectasis/scarring. Unchanged jejunostomy tube. Atherosclerosis. Electronically Signed   By: Corrie Mckusick D.O.   On: 10/11/2018 13:25   Ct Chest W Contrast  Addendum Date: 10/10/2018   ADDENDUM REPORT: 10/10/2018 20:40 ADDENDUM: These results were called by telephone at the time of interpretation on 10/10/2018 at 8:40 pm to Dr. Stark Klein , who verbally acknowledged these results. Electronically Signed   By: Constance Holster M.D.   On: 10/10/2018 20:40   Result Date: 10/10/2018 CLINICAL DATA:  Status post diagnostic laparoscopy with proximal gastrectomy and esophagectomy with pyloroplasty and jejunostomy tube placement. EXAM: CT CHEST, ABDOMEN, AND PELVIS WITH CONTRAST TECHNIQUE: Multidetector CT imaging of the chest, abdomen and pelvis was  performed following the standard protocol during bolus administration of intravenous contrast. CONTRAST:  135mL OMNIPAQUE IOHEXOL 300 MG/ML  SOLN COMPARISON:  None. FINDINGS: CT CHEST FINDINGS Cardiovascular: The heart size is normal. There are coronary artery calcifications. There is no significant pericardial effusion. Mediastinum/Nodes: There are no pathologically enlarged lymph nodes. There are a few partially calcified thyroid nodules involving the left hemi thyroid. This is similar to prior study. There are no pathologically enlarged axillary lymph nodes. Lungs/Pleura: There is a likely at least partially loculated small left-sided pleural effusion. Pleural base calcifications are again noted on the left there is atelectasis at the lung bases. There is no pneumothorax. Musculoskeletal: There is a right-sided Port-A-Cath in place with tip terminating near the distal SVC. There are old right-sided rib fractures. CT ABDOMEN PELVIS FINDINGS Hepatobiliary: No focal liver abnormality is seen. Status post cholecystectomy. No biliary dilatation. Pancreas: The patient is status post distal pancreatectomy. There are few pockets of fluid and gas adjacent to the distal pancreas. 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 Adrenals/Urinary Tract: Adrenal glands are unremarkable. Kidneys are normal, without renal calculi, focal lesion, or hydronephrosis. There is a focus of gas within the urinary bladder likely related to recent instrumentation. Stomach/Bowel: There are postsurgical changes related to recent partial gastrectomy. There is an NG tube that courses down the esophagus. The tip is not clearly intraluminal, but this may be secondary to imaging technique. There is extensive gas that is extraluminal coursing superiorly anterior to the distal esophagus. There is extensive extraluminal gas in the left upper quadrant coursing down the omentum. There are fluid pockets associated with this gas. There is a more  well-formed 4.5 by 5.1 cm air and fluid collection in the left upper quadrant. A jejunostomy tube is noted. It appears to terminate with the in the jejunum. A surgical drain courses through  the patient's upper abdomen and terminates in the left upper quadrant. Pockets of subcutaneous gas are noted along the anterior abdominal wall. The colon is unremarkable. There is some contrast within the transverse colon likely from prior upper GI series. There is contrast within the small bowel without evidence of an obstruction. Vascular/Lymphatic: Aortic atherosclerosis. No enlarged abdominal or pelvic lymph nodes. Reproductive: The prostate gland is mildly enlarged. Other: A midline incision is noted with multiple overlying skin staples. There are pockets of pneumoperitoneum scattered throughout the upper abdomen. There is a trace amount of free fluid in the upper abdomen. No significant free fluid in the patient's pelvis. There is mild body wall edema. Musculoskeletal: No fracture is seen. IMPRESSION: 1. There is extensive extraluminal gas in the left upper quadrant with small pockets of fluid. These pockets of gas extends superiorly and course anterior to the distal esophagus. Findings are concerning for an anastomotic leak or breakdown. The wall of the gastric remnant is not well appreciated. Gastric necrosis is within the differential considerations. There is gas coursing through the omentum, raising suspicion for omental necrosis or infarct. 2. The tip of the NG tube is not definitively intraluminal. 3. There is a 5.1 x 4.5 cm fluid collection in the left upper quadrant with a few pockets of gas. This could represent a developing abscess or postoperative seroma/hematoma. 4. Small likely at least partially loculated left-sided pleural effusion. There is a trace right-sided pleural effusion. There is atelectasis at the lung bases. 5. Well-positioned jejunostomy tube without evidence of a small-bowel obstruction. 6. Small  focus of gas within the urinary bladder presumably root secondary to recent instrumentation. 7.  Aortic Atherosclerosis (ICD10-I70.0). Addendum to be added once the provider is contacted to discuss the above findings. Electronically Signed: By: Constance Holster M.D. On: 10/10/2018 20:01   Ct Abdomen Pelvis W Contrast  Addendum Date: 10/10/2018   ADDENDUM REPORT: 10/10/2018 20:40 ADDENDUM: These results were called by telephone at the time of interpretation on 10/10/2018 at 8:40 pm to Dr. Stark Klein , who verbally acknowledged these results. Electronically Signed   By: Constance Holster M.D.   On: 10/10/2018 20:40   Result Date: 10/10/2018 CLINICAL DATA:  Status post diagnostic laparoscopy with proximal gastrectomy and esophagectomy with pyloroplasty and jejunostomy tube placement. EXAM: CT CHEST, ABDOMEN, AND PELVIS WITH CONTRAST TECHNIQUE: Multidetector CT imaging of the chest, abdomen and pelvis was performed following the standard protocol during bolus administration of intravenous contrast. CONTRAST:  171mL OMNIPAQUE IOHEXOL 300 MG/ML  SOLN COMPARISON:  None. FINDINGS: CT CHEST FINDINGS Cardiovascular: The heart size is normal. There are coronary artery calcifications. There is no significant pericardial effusion. Mediastinum/Nodes: There are no pathologically enlarged lymph nodes. There are a few partially calcified thyroid nodules involving the left hemi thyroid. This is similar to prior study. There are no pathologically enlarged axillary lymph nodes. Lungs/Pleura: There is a likely at least partially loculated small left-sided pleural effusion. Pleural base calcifications are again noted on the left there is atelectasis at the lung bases. There is no pneumothorax. Musculoskeletal: There is a right-sided Port-A-Cath in place with tip terminating near the distal SVC. There are old right-sided rib fractures. CT ABDOMEN PELVIS FINDINGS Hepatobiliary: No focal liver abnormality is seen. Status post  cholecystectomy. No biliary dilatation. Pancreas: The patient is status post distal pancreatectomy. There are few pockets of fluid and gas adjacent to the distal pancreas. 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 Adrenals/Urinary Tract: Adrenal glands are unremarkable. Kidneys are normal, without  renal calculi, focal lesion, or hydronephrosis. There is a focus of gas within the urinary bladder likely related to recent instrumentation. Stomach/Bowel: There are postsurgical changes related to recent partial gastrectomy. There is an NG tube that courses down the esophagus. The tip is not clearly intraluminal, but this may be secondary to imaging technique. There is extensive gas that is extraluminal coursing superiorly anterior to the distal esophagus. There is extensive extraluminal gas in the left upper quadrant coursing down the omentum. There are fluid pockets associated with this gas. There is a more well-formed 4.5 by 5.1 cm air and fluid collection in the left upper quadrant. A jejunostomy tube is noted. It appears to terminate with the in the jejunum. A surgical drain courses through the patient's upper abdomen and terminates in the left upper quadrant. Pockets of subcutaneous gas are noted along the anterior abdominal wall. The colon is unremarkable. There is some contrast within the transverse colon likely from prior upper GI series. There is contrast within the small bowel without evidence of an obstruction. Vascular/Lymphatic: Aortic atherosclerosis. No enlarged abdominal or pelvic lymph nodes. Reproductive: The prostate gland is mildly enlarged. Other: A midline incision is noted with multiple overlying skin staples. There are pockets of pneumoperitoneum scattered throughout the upper abdomen. There is a trace amount of free fluid in the upper abdomen. No significant free fluid in the patient's pelvis. There is mild body wall edema. Musculoskeletal: No fracture is seen. IMPRESSION: 1. There is  extensive extraluminal gas in the left upper quadrant with small pockets of fluid. These pockets of gas extends superiorly and course anterior to the distal esophagus. Findings are concerning for an anastomotic leak or breakdown. The wall of the gastric remnant is not well appreciated. Gastric necrosis is within the differential considerations. There is gas coursing through the omentum, raising suspicion for omental necrosis or infarct. 2. The tip of the NG tube is not definitively intraluminal. 3. There is a 5.1 x 4.5 cm fluid collection in the left upper quadrant with a few pockets of gas. This could represent a developing abscess or postoperative seroma/hematoma. 4. Small likely at least partially loculated left-sided pleural effusion. There is a trace right-sided pleural effusion. There is atelectasis at the lung bases. 5. Well-positioned jejunostomy tube without evidence of a small-bowel obstruction. 6. Small focus of gas within the urinary bladder presumably root secondary to recent instrumentation. 7.  Aortic Atherosclerosis (ICD10-I70.0). Addendum to be added once the provider is contacted to discuss the above findings. Electronically Signed: By: Constance Holster M.D. On: 10/10/2018 20:01    Anti-infectives: Anti-infectives (From admission, onward)   Start     Dose/Rate Route Frequency Ordered Stop   10/09/18 1800  vancomycin (VANCOCIN) 1,500 mg in sodium chloride 0.9 % 500 mL IVPB     1,500 mg 250 mL/hr over 120 Minutes Intravenous Every 24 hours 10/08/18 1648     10/09/18 0200  piperacillin-tazobactam (ZOSYN) IVPB 3.375 g     3.375 g 12.5 mL/hr over 240 Minutes Intravenous Every 8 hours 10/08/18 1648     10/08/18 1700  vancomycin (VANCOCIN) 1,500 mg in sodium chloride 0.9 % 500 mL IVPB     1,500 mg 250 mL/hr over 120 Minutes Intravenous  Once 10/08/18 1648 10/08/18 1924   10/08/18 1645  piperacillin-tazobactam (ZOSYN) IVPB 3.375 g     3.375 g 100 mL/hr over 30 Minutes Intravenous  Once  10/08/18 1638 10/08/18 1749   10/01/18 1600  ceFAZolin (ANCEF) IVPB 2g/100 mL premix  2 g 200 mL/hr over 30 Minutes Intravenous Every 8 hours 10/01/18 1539 10/01/18 1759   10/01/18 0631  ceFAZolin (ANCEF) 2-4 GM/100ML-% IVPB    Note to Pharmacy: Marga Melnick   : cabinet override      10/01/18 0631 10/01/18 0814   10/01/18 0630  ceFAZolin (ANCEF) IVPB 2g/100 mL premix     2 g 200 mL/hr over 30 Minutes Intravenous On call to O.R. 10/01/18 3128 10/01/18 1188      Assessment/Plan: s/p Procedure(s): LAPAROSCOPY DIAGNOSTIC (N/A) JEJUNOSTOMY TUBE PLACEMENT (N/A) PYLOROPLASTY  (N/A) OPEN PROXIMAL GASTRECTOMY  OPERATIVE ULTRASOUND   ABL anemia -  Fever - improved.    UGI shows probable small leak.  CT showed no leak in contrast, but Keep NGT to suction and npo x ice chips.  Full tube feeds.  IV antibiotics.   Urinating after foley removal.   nutrition consult - tube feeds at goal. Having some bowel movements Increase lantus for DM.  Ok for LIQUID MEDS ONLY via J tube.  No crushed meds.   Low dose Metoprolol for HTN/HR via J tube   Moderate protein calorie malnutrition. OOB, pulm toilet  Lovenox.   Oxycodone via J tube prn for addl pain control standing.   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 around 10 days out from proximal gastrectomy with tubularization of stomach and connection to distal esophagus like an ivor lewis but a bit lower without thoracotomy, pyloroplasty, distal pancreatectomy/splenectomy. Has leak from anastamosis and infected LUQ hematoma. Thursday pm scan looked scarier, but got repeat today with some contrast via NGT and this is intraluminal. Air dots are throughout this phlegmon. Drain consistent with this with old blood coming out. The drain comes out the right abdomen. He is at full J tube feeds.     LOS: 11 days    Maia Petties 10/12/2018

## 2018-10-13 LAB — GLUCOSE, CAPILLARY
Glucose-Capillary: 103 mg/dL — ABNORMAL HIGH (ref 70–99)
Glucose-Capillary: 104 mg/dL — ABNORMAL HIGH (ref 70–99)
Glucose-Capillary: 117 mg/dL — ABNORMAL HIGH (ref 70–99)
Glucose-Capillary: 162 mg/dL — ABNORMAL HIGH (ref 70–99)
Glucose-Capillary: 204 mg/dL — ABNORMAL HIGH (ref 70–99)
Glucose-Capillary: 231 mg/dL — ABNORMAL HIGH (ref 70–99)

## 2018-10-13 NOTE — Progress Notes (Signed)
Progress Note: General Surgery Service   Assessment/Plan: Active Problems:   Gastric carcinoma (HCC)   Primary adenocarcinoma of cardia of stomach (HCC)  s/p Procedure(s): LAPAROSCOPY DIAGNOSTIC JEJUNOSTOMY TUBE PLACEMENT PYLOROPLASTY  OPEN PROXIMAL GASTRECTOMY  OPERATIVE ULTRASOUND 10/01/2018  ABL anemia -  Fever - improved. UGI shows probable small leak. CT showed no leak in contrast, but Keep NGT to suction and npo x ice chips. Full tube feeds. IV antibiotics.  Urinating after foley removal.  nutrition consult - tube feeds at goal. Having some flatus Increase lantus for DM.  Ok for LIQUID MEDS ONLY via J tube. No crushed meds.  Low dose Metoprolol for HTN/HR via J tube  Moderate protein calorie malnutrition. OOB, pulm toilet  Lovenox.  Oxycodone via J tube prn for addl pain control standing.  Low dose ativan PRN for anxiety   LOS: 12 days  Chief Complaint/Subjective: Up to chair yesterday, some pain in left lower area, no nausea  Objective: Vital signs in last 24 hours: Temp:  [98 F (36.7 C)-99.5 F (37.5 C)] 98 F (36.7 C) (06/14 0446) Pulse Rate:  [72-83] 72 (06/14 0446) Resp:  [17-22] 17 (06/14 0446) BP: (120-135)/(55-63) 131/59 (06/14 0446) SpO2:  [93 %-97 %] 94 % (06/14 0446) Last BM Date: 10/10/18  Intake/Output from previous day: 06/13 0701 - 06/14 0700 In: 549.9 [P.O.:180; I.V.:219.9; IV Piggyback:150] Out: 2160 [Urine:1450; Emesis/NG output:450; Drains:260] Intake/Output this shift: No intake/output data recorded.  Lungs: nonlabored  Cardiovascular: RRR  Abd: soft, incision c/d/i, right drain with dark fluid  Extremities: no edema  Neuro: AOx4  Lab Results: CBC  Recent Labs    10/11/18 0227 10/12/18 0512  WBC 15.5* 14.5*  HGB 7.1* 7.2*  HCT 22.3* 23.1*  PLT 457* 584*   BMET Recent Labs    10/11/18 0227 10/12/18 0512  NA 146* 146*  K 3.9 4.0  CL 109 111  CO2 28 26  GLUCOSE 240* 214*  BUN 19 22  CREATININE  0.88 0.85  CALCIUM 8.0* 7.9*   PT/INR Recent Labs    10/11/18 0227  LABPROT 14.7  INR 1.2   ABG No results for input(s): PHART, HCO3 in the last 72 hours.  Invalid input(s): PCO2, PO2  Studies/Results:  Anti-infectives: Anti-infectives (From admission, onward)   Start     Dose/Rate Route Frequency Ordered Stop   10/12/18 1800  vancomycin (VANCOCIN) 2,000 mg in sodium chloride 0.9 % 500 mL IVPB     2,000 mg 250 mL/hr over 120 Minutes Intravenous Every 24 hours 10/12/18 0958     10/09/18 1800  vancomycin (VANCOCIN) 1,500 mg in sodium chloride 0.9 % 500 mL IVPB  Status:  Discontinued     1,500 mg 250 mL/hr over 120 Minutes Intravenous Every 24 hours 10/08/18 1648 10/12/18 0957   10/09/18 0200  piperacillin-tazobactam (ZOSYN) IVPB 3.375 g     3.375 g 12.5 mL/hr over 240 Minutes Intravenous Every 8 hours 10/08/18 1648     10/08/18 1700  vancomycin (VANCOCIN) 1,500 mg in sodium chloride 0.9 % 500 mL IVPB     1,500 mg 250 mL/hr over 120 Minutes Intravenous  Once 10/08/18 1648 10/08/18 1924   10/08/18 1645  piperacillin-tazobactam (ZOSYN) IVPB 3.375 g     3.375 g 100 mL/hr over 30 Minutes Intravenous  Once 10/08/18 1638 10/08/18 1749   10/01/18 1600  ceFAZolin (ANCEF) IVPB 2g/100 mL premix     2 g 200 mL/hr over 30 Minutes Intravenous Every 8 hours 10/01/18 1539 10/01/18 1759  10/01/18 0631  ceFAZolin (ANCEF) 2-4 GM/100ML-% IVPB    Note to Pharmacy: Marga Melnick   : cabinet override      10/01/18 0631 10/01/18 0814   10/01/18 0630  ceFAZolin (ANCEF) IVPB 2g/100 mL premix     2 g 200 mL/hr over 30 Minutes Intravenous On call to O.R. 10/01/18 1188 10/01/18 6773      Medications: Scheduled Meds: . sodium chloride   Intravenous Once  . bisacodyl  10 mg Rectal Daily  . chlorhexidine  15 mL Mouth Rinse BID  . Chlorhexidine Gluconate Cloth  6 each Topical Daily  . docusate  100 mg Per Tube Daily  . enoxaparin (LOVENOX) injection  40 mg Subcutaneous Q24H  . feeding supplement  (PIVOT 1.5 CAL)  1,000 mL Per Tube Q24H  . insulin aspart  0-20 Units Subcutaneous Q4H  . insulin glargine  40 Units Subcutaneous Daily  . mouth rinse  15 mL Mouth Rinse q12n4p  . metoprolol tartrate  12.5 mg Per Tube BID  . oxyCODONE  5 mg Per Tube Q8H  . pantoprazole sodium  40 mg Per Tube QHS  . sodium chloride flush  10-40 mL Intracatheter Q12H   Continuous Infusions: . sodium chloride Stopped (10/11/18 1123)  . dextrose 5 % and 0.45 % NaCl with KCl 20 mEq/L 20 mL/hr at 10/12/18 0300  . methocarbamol (ROBAXIN) IV Stopped (10/07/18 2047)  . piperacillin-tazobactam (ZOSYN)  IV 3.375 g (10/13/18 0356)  . vancomycin 2,000 mg (10/12/18 2028)   PRN Meds:.sodium chloride, acetaminophen (TYLENOL) oral liquid 160 mg/5 mL, hydrALAZINE, HYDROmorphone (DILAUDID) injection, LORazepam, methocarbamol (ROBAXIN) IV, prochlorperazine **OR** prochlorperazine, sodium chloride flush  Mickeal Skinner, MD [facility]

## 2018-10-14 LAB — BASIC METABOLIC PANEL
Anion gap: 8 (ref 5–15)
BUN: 21 mg/dL (ref 8–23)
CO2: 25 mmol/L (ref 22–32)
Calcium: 7.7 mg/dL — ABNORMAL LOW (ref 8.9–10.3)
Chloride: 110 mmol/L (ref 98–111)
Creatinine, Ser: 0.94 mg/dL (ref 0.61–1.24)
GFR calc Af Amer: >60 mL/min (ref >60)
GFR calc non Af Amer: >60 mL/min (ref >60)
Glucose, Bld: 244 mg/dL — ABNORMAL HIGH (ref 70–99)
Potassium: 3.8 mmol/L (ref 3.5–5.1)
Sodium: 143 mmol/L (ref 135–145)

## 2018-10-14 LAB — GLUCOSE, CAPILLARY
Glucose-Capillary: 146 mg/dL — ABNORMAL HIGH (ref 70–99)
Glucose-Capillary: 177 mg/dL — ABNORMAL HIGH (ref 70–99)
Glucose-Capillary: 197 mg/dL — ABNORMAL HIGH (ref 70–99)
Glucose-Capillary: 203 mg/dL — ABNORMAL HIGH (ref 70–99)
Glucose-Capillary: 212 mg/dL — ABNORMAL HIGH (ref 70–99)
Glucose-Capillary: 228 mg/dL — ABNORMAL HIGH (ref 70–99)

## 2018-10-14 LAB — CBC
HCT: 24.4 % — ABNORMAL LOW (ref 39.0–52.0)
Hemoglobin: 7.5 g/dL — ABNORMAL LOW (ref 13.0–17.0)
MCH: 28.4 pg (ref 26.0–34.0)
MCHC: 30.7 g/dL (ref 30.0–36.0)
MCV: 92.4 fL (ref 80.0–100.0)
Platelets: 731 10*3/uL — ABNORMAL HIGH (ref 150–400)
RBC: 2.64 MIL/uL — ABNORMAL LOW (ref 4.22–5.81)
RDW: 15.3 % (ref 11.5–15.5)
WBC: 12.8 10*3/uL — ABNORMAL HIGH (ref 4.0–10.5)
nRBC: 3.1 % — ABNORMAL HIGH (ref 0.0–0.2)

## 2018-10-14 NOTE — Progress Notes (Signed)
Progress Note: General Surgery Service   Assessment/Plan: Active Problems:   Gastric carcinoma (HCC)   Primary adenocarcinoma of cardia of stomach (HCC)  s/p Procedure(s): LAPAROSCOPY DIAGNOSTIC JEJUNOSTOMY TUBE PLACEMENT PYLOROPLASTY  OPEN PROXIMAL GASTRECTOMY  OPERATIVE ULTRASOUND 10/01/2018  ABL anemia -  Fever - improved. UGI shows probable small leak. NGt no longer putting out much.  Will d/c ngt.  Will continue npo.   Agree with patient and family to work toward home.  His wife and daughter can be there for him and they want to minimize risk of exposure to covid.    nutrition consult - tube feeds at goal. Having some flatus Increase lantus for DM.  Ok for LIQUID MEDS ONLY via J tube. No crushed meds.  Low dose Metoprolol for HTN/HR via J tube  Moderate protein calorie malnutrition. OOB, pulm toilet  Lovenox.  Oxycodone via J tube prn for addl pain control standing.  Low dose ativan PRN for anxiety   LOS: 13 days  Chief Complaint/Subjective: Feeling better, but still not sleeping.    Objective: Vital signs in last 24 hours: Temp:  [98.2 F (36.8 C)-99 F (37.2 C)] 99 F (37.2 C) (06/15 0419) Pulse Rate:  [78-82] 82 (06/15 1202) Resp:  [16-20] 17 (06/15 0419) BP: (128-158)/(56-70) 158/61 (06/15 1202) SpO2:  [93 %-98 %] 93 % (06/15 0419) Last BM Date: 10/10/18  Intake/Output from previous day: 06/14 0701 - 06/15 0700 In: 1347.9 [I.V.:500; IV Piggyback:647.9] Out: 1705 [Urine:1650; Emesis/NG output:50; Drains:5] Intake/Output this shift: No intake/output data recorded.  Lungs: nonlabored  Cardiovascular: RRR  Abd: soft, incision c/d/i, right drain with dark fluid   Extremities: no edema  Neuro: AOx4  Lab Results: CBC  Recent Labs    10/12/18 0512 10/14/18 0424  WBC 14.5* 12.8*  HGB 7.2* 7.5*  HCT 23.1* 24.4*  PLT 584* 731*   BMET Recent Labs    10/12/18 0512 10/14/18 0424  NA 146* 143  K 4.0 3.8  CL 111 110  CO2 26 25   GLUCOSE 214* 244*  BUN 22 21  CREATININE 0.85 0.94  CALCIUM 7.9* 7.7*   PT/INR No results for input(s): LABPROT, INR in the last 72 hours. ABG No results for input(s): PHART, HCO3 in the last 72 hours.  Invalid input(s): PCO2, PO2  Studies/Results:  Anti-infectives: Anti-infectives (From admission, onward)   Start     Dose/Rate Route Frequency Ordered Stop   10/12/18 1800  vancomycin (VANCOCIN) 2,000 mg in sodium chloride 0.9 % 500 mL IVPB     2,000 mg 250 mL/hr over 120 Minutes Intravenous Every 24 hours 10/12/18 0958     10/09/18 1800  vancomycin (VANCOCIN) 1,500 mg in sodium chloride 0.9 % 500 mL IVPB  Status:  Discontinued     1,500 mg 250 mL/hr over 120 Minutes Intravenous Every 24 hours 10/08/18 1648 10/12/18 0957   10/09/18 0200  piperacillin-tazobactam (ZOSYN) IVPB 3.375 g     3.375 g 12.5 mL/hr over 240 Minutes Intravenous Every 8 hours 10/08/18 1648     10/08/18 1700  vancomycin (VANCOCIN) 1,500 mg in sodium chloride 0.9 % 500 mL IVPB     1,500 mg 250 mL/hr over 120 Minutes Intravenous  Once 10/08/18 1648 10/08/18 1924   10/08/18 1645  piperacillin-tazobactam (ZOSYN) IVPB 3.375 g     3.375 g 100 mL/hr over 30 Minutes Intravenous  Once 10/08/18 1638 10/08/18 1749   10/01/18 1600  ceFAZolin (ANCEF) IVPB 2g/100 mL premix     2 g 200 mL/hr  over 30 Minutes Intravenous Every 8 hours 10/01/18 1539 10/01/18 1759   10/01/18 0631  ceFAZolin (ANCEF) 2-4 GM/100ML-% IVPB    Note to Pharmacy: Marga Melnick   : cabinet override      10/01/18 0631 10/01/18 0814   10/01/18 0630  ceFAZolin (ANCEF) IVPB 2g/100 mL premix     2 g 200 mL/hr over 30 Minutes Intravenous On call to O.R. 10/01/18 3664 10/01/18 4034      Medications: Scheduled Meds: . sodium chloride   Intravenous Once  . bisacodyl  10 mg Rectal Daily  . chlorhexidine  15 mL Mouth Rinse BID  . Chlorhexidine Gluconate Cloth  6 each Topical Daily  . docusate  100 mg Per Tube Daily  . enoxaparin (LOVENOX) injection   40 mg Subcutaneous Q24H  . feeding supplement (PIVOT 1.5 CAL)  1,000 mL Per Tube Q24H  . insulin aspart  0-20 Units Subcutaneous Q4H  . insulin glargine  40 Units Subcutaneous Daily  . mouth rinse  15 mL Mouth Rinse q12n4p  . metoprolol tartrate  12.5 mg Per Tube BID  . oxyCODONE  5 mg Per Tube Q8H  . pantoprazole sodium  40 mg Per Tube QHS  . sodium chloride flush  10-40 mL Intracatheter Q12H   Continuous Infusions: . sodium chloride Stopped (10/11/18 1123)  . dextrose 5 % and 0.45 % NaCl with KCl 20 mEq/L 20 mL/hr at 10/13/18 1317  . methocarbamol (ROBAXIN) IV Stopped (10/07/18 2047)  . piperacillin-tazobactam (ZOSYN)  IV 3.375 g (10/14/18 0151)  . vancomycin 2,000 mg (10/13/18 2025)   PRN Meds:.sodium chloride, acetaminophen (TYLENOL) oral liquid 160 mg/5 mL, hydrALAZINE, HYDROmorphone (DILAUDID) injection, LORazepam, methocarbamol (ROBAXIN) IV, prochlorperazine **OR** prochlorperazine, sodium chloride flush  Stark Klein, MD [facility]

## 2018-10-14 NOTE — Progress Notes (Signed)
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 Pt PMH including Anxiety, CAD (07/09/2009), Depression, Diabetes, HTN, OSA.    PT Comments    Pt performed gait training and functional mobility.  He remains slow and guarded due to abdominal pain.  Pt required cues for sequencing and safety with RW.  Plan for aggressive CIR therapies remains appropriate at this time.  Will continue to follow during hospital stay.    Follow Up Recommendations  CIR;Supervision for mobility/OOB;Supervision/Assistance - 24 hour     Equipment Recommendations  Rolling walker with 5" wheels;3in1 (PT)    Recommendations for Other Services       Precautions / Restrictions Precautions Precautions: Other (comment);Fall Precaution Comments: lots of abdominal incisions, drains, peg tube Restrictions Weight Bearing Restrictions: No Other Position/Activity Restrictions: NG tube removed 10/14/2018    Mobility  Bed Mobility Overal bed mobility: Needs Assistance Bed Mobility: Rolling;Sidelying to Sit Rolling: Mod assist;+2 for safety/equipment Sidelying to sit: Mod assist;+2 for safety/equipment       General bed mobility comments: Pt required assistance to elevate trunk into sitting.  Tech present to manage lines and leads  Transfers Overall transfer level: Needs assistance Equipment used: Rolling walker (2 wheeled) Transfers: Sit to/from Stand Sit to Stand: Mod assist;+2 safety/equipment         General transfer comment: Cues for hand placement to push into standing.  Pt required moderate assistance to boost.  Ambulation/Gait Ambulation/Gait assistance: Mod assist;+2  safety/equipment Gait Distance (Feet): 30 Feet Assistive device: Rolling walker (2 wheeled) Gait Pattern/deviations: Step-to pattern;Trunk flexed     General Gait Details: Pt required cues to maintain close proximity to RW and keep upper trunk extended.   Stairs             Wheelchair Mobility    Modified Rankin (Stroke Patients Only)       Balance Overall balance assessment: Needs assistance Sitting-balance support: Feet supported;No upper extremity supported Sitting balance-Leahy Scale: Fair     Standing balance support: Bilateral upper extremity supported;During functional activity Standing balance-Leahy Scale: Poor Standing balance comment: dependent on physical assist and RW. + dizziness in standing. BP and other VSS stable.                            Cognition Arousal/Alertness: Awake/alert Behavior During Therapy: Impulsive;WFL for tasks assessed/performed Overall Cognitive Status: No family/caregiver present to determine baseline cognitive functioning                                 General Comments: pt pleasant but somewhat impulsive with mobility; appears to have some decreased insight into current deficits and need for assist.      Exercises      General Comments        Pertinent Vitals/Pain Pain Assessment: 0-10 Pain Score: 3  Pain Location: abdomen Pain Descriptors / Indicators: Discomfort;Grimacing Pain Intervention(s): Monitored during session;Repositioned    Home Living                      Prior Function            PT Goals (current goals can  now be found in the care plan section) Acute Rehab PT Goals Patient Stated Goal: get better; wants to go home Potential to Achieve Goals: Good    Frequency    Min 3X/week      PT Plan Discharge plan needs to be updated    Co-evaluation              AM-PAC PT "6 Clicks" Mobility   Outcome Measure  Help needed turning from your back to your  side while in a flat bed without using bedrails?: A Lot Help needed moving from lying on your back to sitting on the side of a flat bed without using bedrails?: A Lot Help needed moving to and from a bed to a chair (including a wheelchair)?: A Lot Help needed standing up from a chair using your arms (e.g., wheelchair or bedside chair)?: A Lot Help needed to walk in hospital room?: A Lot Help needed climbing 3-5 steps with a railing? : A Lot 6 Click Score: 12    End of Session Equipment Utilized During Treatment: Gait belt Activity Tolerance: Patient tolerated treatment well;Patient limited by fatigue Patient left: in chair;with call bell/phone within reach;with chair alarm set Nurse Communication: Mobility status PT Visit Diagnosis: Unsteadiness on feet (R26.81);Difficulty in walking, not elsewhere classified (R26.2);Pain Pain - part of body: (abdomen)     Time: 0240-9735 PT Time Calculation (min) (ACUTE ONLY): 24 min  Charges:  $Gait Training: 8-22 mins $Therapeutic Activity: 8-22 mins                     Governor Rooks, PTA Acute Rehabilitation Services Pager [phone] Office [phone]     Deiona Hooper Eli Hose 10/14/2018, 4:46 PM

## 2018-10-14 NOTE — Progress Notes (Signed)
Pharmacy Antibiotic Note  Tristan Hughes is a 78 y.o. male admitted on 10/01/2018 with concern for mild anastomotic leak posteriorly at the esophagogastric anastomosis. Pharmacy consulted to start Vancomycin & Zosyn  Vancomycin peak reported as 19 mg/L and trough 7 mg/L,  Plan: -No change zosyn 3.375g IV every 8 hours -No change vancomycin to 2gm IV q24 hours  Page to CCS, suggest d/c Vanc with negative cx, could continue Zosyn with drain still in place  Height: 5\' 9"  (175.3 cm) Weight: 216 lb 4.3 oz (98.1 kg) IBW/kg (Calculated) : 70.7  Temp (24hrs), Avg:98.9 F (37.2 C), Min:98.4 F (36.9 C), Max:99.5 F (37.5 C)  Recent Labs  Lab 10/09/18 0427 10/10/18 0428 10/11/18 0227 10/11/18 1633 10/11/18 2237 10/12/18 0512 10/14/18 0424  WBC 18.3* 17.6* 15.5*  --   --  14.5* 12.8*  CREATININE 0.83 1.01 0.88  --   --  0.85 0.94  VANCOTROUGH  --   --   --  7*  --   --   --   VANCOPEAK  --   --   --   --  19*  --   --     Estimated Creatinine Clearance: 76.1 mL/min (by C-G formula based on SCr of 0.94 mg/dL).    Allergies  Allergen Reactions  . Naproxen Sodium Swelling    SWELLING REACTION UNSPECIFIED     Antimicrobials this admission: Vanc 6/9 >> Zosyn  6/9 >>  Dose adjustments this admission: 6/12  Changed vanc to 2gm q24  Microbiology results: 6/8 BCx: ng-final 6/8 UCx: ng-final  Thank you for allowing pharmacy to be a part of this patient's care.  Minda Ditto PharmD [phone] 10/14/2018, 3:52 PM

## 2018-10-15 LAB — GLUCOSE, CAPILLARY
Glucose-Capillary: 138 mg/dL — ABNORMAL HIGH (ref 70–99)
Glucose-Capillary: 170 mg/dL — ABNORMAL HIGH (ref 70–99)
Glucose-Capillary: 201 mg/dL — ABNORMAL HIGH (ref 70–99)
Glucose-Capillary: 227 mg/dL — ABNORMAL HIGH (ref 70–99)
Glucose-Capillary: 229 mg/dL — ABNORMAL HIGH (ref 70–99)
Glucose-Capillary: 245 mg/dL — ABNORMAL HIGH (ref 70–99)

## 2018-10-15 MED ORDER — OSMOLITE 1.5 CAL PO LIQD: 1000.0000 mL | ORAL | Status: DC

## 2018-10-15 MED ORDER — FREE WATER
150.0000 mL | Freq: Four times a day (QID) | Status: DC
  Administered 2018-10-16 (×2): 150 mL

## 2018-10-15 MED ORDER — INSULIN GLARGINE 100 UNIT/ML ~~LOC~~ SOLN
45.0000 [IU] | Freq: Every day | SUBCUTANEOUS | Status: DC
  Filled 2018-10-15: qty 0.45

## 2018-10-15 MED ORDER — CITALOPRAM HYDROBROMIDE 10 MG/5ML PO SOLN
10.0000 mg | Freq: Every day | ORAL | Status: DC
  Administered 2018-10-15: 10 mg
  Filled 2018-10-15 (×2): qty 10

## 2018-10-15 MED ORDER — MENTHOL 3 MG MT LOZG
1.0000 | LOZENGE | OROMUCOSAL | Status: DC | PRN
  Administered 2018-10-15: 23:00:00 3 mg via ORAL
  Filled 2018-10-15: qty 9

## 2018-10-15 MED ORDER — OSMOLITE 1.5 CAL PO LIQD
1000.0000 mL | ORAL | Status: DC
  Administered 2018-10-15 – 2018-10-16 (×2): 1000 mL
  Filled 2018-10-15 (×2): qty 1000

## 2018-10-15 MED ORDER — PRO-STAT SUGAR FREE PO LIQD: 30.0000 mL | Freq: Two times a day (BID) | ORAL | Status: DC

## 2018-10-15 MED ORDER — ALBUTEROL SULFATE (2.5 MG/3ML) 0.083% IN NEBU
INHALATION_SOLUTION | RESPIRATORY_TRACT | Status: AC
  Administered 2018-10-15: 2.5 mg
  Filled 2018-10-15: qty 3

## 2018-10-15 MED ORDER — OXYCODONE HCL 5 MG/5ML PO SOLN: 5.0000 mg | ORAL | Status: DC | PRN

## 2018-10-15 MED ORDER — ALBUTEROL SULFATE (2.5 MG/3ML) 0.083% IN NEBU
2.5000 mg | INHALATION_SOLUTION | Freq: Four times a day (QID) | RESPIRATORY_TRACT | Status: DC | PRN
  Administered 2018-10-16: 2.5 mg via RESPIRATORY_TRACT
  Filled 2018-10-15 (×3): qty 3

## 2018-10-15 MED ORDER — OXYCODONE HCL 5 MG/5ML PO SOLN
5.0000 mg | Freq: Three times a day (TID) | ORAL | Status: DC
  Administered 2018-10-15 – 2018-10-16 (×3): 5 mg
  Filled 2018-10-15 (×3): qty 5

## 2018-10-15 MED ORDER — TRAMADOL 5 MG/ML ORAL SUSPENSION: 50.0000 mg | Freq: Four times a day (QID) | ORAL | Status: DC

## 2018-10-15 NOTE — TOC Initial Note (Addendum)
Transition of Care [facility]) - Initial/Assessment Note    Patient Details  Name: Tristan Hughes MRN: [id number] Date of Birth: [birth year]  Transition of [facility]) CM/SW Contact:    Marilu Favre, RN Phone Number: 10/15/2018, 1:44 PM  Clinical Narrative:   20 [facility] with [facility] has accepted referral . Called wife back to update her . She wants [facility] now and not [place]. Called Joen Laura with [facility] she will call NCM back in morning. Tommi Rumps with [facility] aware   [place] returned call she would like [place] referral called to [facility] with [facility] awaiting call back            Dan with [facility] unable to take referral. [place] , they only provide palliative care and hospice.   Spoke with patient's wife Vaughan Basta, explained above she would like to try [facility] or [facility]. Spoke with Malachy Mood with [facility] is she unable to accept referral. Delaney Meigs with [facility] , awaiting call back.    Spoke to patient at bedside regarding potential discharge to home on Friday. Explained home health will not make daily visits, so he and family will be taught tube feedings, drain and wound care. Patient voiced understanding. Patient asked NCM to call his wife Everett Ehrler to discuss home health agency choice.   Spoke to [employer] via phone explained above. Vaughan Basta voiced understanding. Vaughan Basta in agreement to arrange a time with bedside nurse for teaching. Bedside nurse to call her to arrange a time once order for tube feedings is clarified. NCM has paged MD.  Vaughan Basta presently not at home. Requested NCM to email Medicare.gov link to her so she can review agencies. Vaughan Basta has NCM direct cell number. Encouraged her to review list today and call NCM back today with her three top choices. Await call back.   Ordered walker and 3 in 1 once MD signed will call Zack with [facility].  Expected Discharge Plan: [place] Barriers  to Discharge: Continued Medical Work up   Patient Goals and CMS Choice Patient states their goals for this hospitalization and ongoing recovery are:: to return to home CMS Medicare.gov Compare Post Acute Care list provided to:: Patient Choice offered to / list presented to : Patient, Spouse  Expected Discharge Plan and Services Expected Discharge Plan: [place]   Discharge Planning Services: CM Consult Post Acute Care Choice: Home Health, Durable Medical Equipment Living arrangements for the past 2 months: Single Family Home                 DME Arranged: Tube feeding, Tube feeding pump DME Agency: [facility]       HH Arranged: RN, Nurse's Aide, PT          Prior Living Arrangements/Services Living arrangements for the past 2 months: Single Family Home Lives with:: Spouse Patient language and need for interpreter reviewed:: Yes Do you feel safe going back to the place where you live?: Yes      Need for Family Participation in Patient Care: Yes (Comment) Care giver support system in place?: Yes (comment)   Criminal Activity/Legal Involvement Pertinent to Current Situation/Hospitalization: No - Comment as needed  Activities of Daily Living Home Assistive Devices/Equipment: Eyeglasses, CBG Meter, Blood pressure cuff ADL Screening (condition at time of admission) Patient's cognitive ability adequate to safely complete daily activities?: Yes Is the patient deaf or have difficulty hearing?: No  Does the patient have difficulty seeing, even when wearing glasses/contacts?: No Does the patient have difficulty concentrating, remembering, or making decisions?: No Patient able to express need for assistance with ADLs?: Yes Does the patient have difficulty dressing or bathing?: No Independently performs ADLs?: Yes (appropriate for developmental age) Does the patient have difficulty walking or climbing stairs?: No Weakness of Legs: None Weakness of Arms/Hands:  None  Permission Sought/Granted Permission sought to share information with : Case Manager Permission granted to share information with : Yes, Verbal Permission Granted  Share Information with NAME: Jhonathan Desroches  Permission granted to share info w AGENCY: [facility]  Permission granted to share info w Relationship: wife     Emotional Assessment Appearance:: Appears stated age Attitude/Demeanor/Rapport: Engaged Affect (typically observed): Pleasant Orientation: : Oriented to Self, Oriented to Place, Oriented to  Time, Oriented to Situation Alcohol / Substance Use: Not Applicable Psych Involvement: No (comment)  Admission diagnosis:  Primary adenocarcinoma of cardia of stomach ([place]) [C16.0] Patient Active Problem List   Diagnosis Date Noted  . Gastric carcinoma ([place]) 10/01/2018  . Primary adenocarcinoma of cardia of stomach ([place]) 10/01/2018  . Genetic testing 09/13/2018  . 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 Family history of colon cancer   . Family history of pancreatic cancer   . Anemia due to antineoplastic chemotherapy 07/17/2018  . Port-A-Cath in place 06/12/2018  . Leukopenia due to antineoplastic chemotherapy ([place]) 06/12/2018  . Chemotherapy-induced thrombocytopenia 06/12/2018  . Protein malnutrition ([place]) 06/12/2018  . Drug rash 06/12/2018  . Chemotherapy-induced nausea 05/22/2018  . Gastric adenocarcinoma ([place]) 05/08/2018  . Abdominal pain 05/08/2018  . Obesity (BMI 30-39.9) 10/16/2013  . Type 2 diabetes mellitus, controlled ([place]) 07/09/2009  . Hyperlipidemia 07/09/2009  . OBSTRUCTIVE SLEEP APNEA 07/09/2009  . Essential hypertension 07/09/2009  . Coronary atherosclerosis 07/09/2009  . COLONIC POLYPS, HX OF 07/09/2009   PCP:  Eulas Post, MD Pharmacy:   [facility] [address], [place] [address] [place] [address] Phone: [phone] Fax: [phone]  [facility] [place],  [place] [address] Phone: [phone] Fax: [phone]     Social Determinants of Health (SDOH) Interventions    Readmission Risk Interventions No flowsheet data found.

## 2018-10-15 NOTE — Progress Notes (Signed)
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 Pt PMH including Anxiety, CAD (07/09/2009), Depression, Diabetes, HTN, OSA.    PT Comments    Pt performed gait training to perform ADL at sink.  He was unable to remain standing to complete ADLs due to fatigue.  After performing ADLs in sitting he performed 50 ft gait trial.  He remains unsteady and required close chair follow and minimal assistance.  Based on progression her remains a great candidate for aggressive CIR therapies to improve strength and function.      Follow Up Recommendations  CIR;Supervision for mobility/OOB;Supervision/Assistance - 24 hour     Equipment Recommendations  Rolling walker with 5" wheels;3in1 (PT)    Recommendations for Other Services       Precautions / Restrictions Precautions Precautions: Other (comment);Fall Precaution Comments: lots of abdominal incisions, drains, peg tube Restrictions Weight Bearing Restrictions: No Other Position/Activity Restrictions: NG tube removed 10/14/2018    Mobility  Bed Mobility Overal bed mobility: Needs Assistance Bed Mobility: Rolling;Sidelying to Sit Rolling: Modified independent (Device/Increase time) Sidelying to sit: Min assist       General bed mobility comments: Pt required assistance to elevate trunk into sitting and to manage lines and leads  Transfers Overall transfer level: Needs assistance Equipment used: Rolling walker (2 wheeled) Transfers: Sit to/from Stand Sit to Stand: Supervision;Min guard         General transfer comment: Cues for hand placement to push into standing.   Supervision from bed and min guard from  commode.  Cues for forward weight shifting.  Ambulation/Gait Ambulation/Gait assistance: +2 safety/equipment;Min assist Gait Distance (Feet): 10 Feet(+ 50 ft trial.) Assistive device: Rolling walker (2 wheeled) Gait Pattern/deviations: Step-to pattern;Trunk flexed;Decreased stride length;Antalgic     General Gait Details: Cues for upper trunk control and scapular retraction, close chair follow required.  Cues to stay close to RW to improve safety.  Pt fatigues quickly.   Stairs             Wheelchair Mobility    Modified Rankin (Stroke Patients Only)       Balance Overall balance assessment: Needs assistance Sitting-balance support: Feet supported;No upper extremity supported Sitting balance-Leahy Scale: Fair     Standing balance support: Bilateral upper extremity supported;During functional activity Standing balance-Leahy Scale: Poor Standing balance comment: dependent on physical assist and RW. + dizziness in standing. BP and other VSS stable.                            Cognition Arousal/Alertness: Awake/alert Behavior During Therapy: Impulsive;WFL for tasks assessed/performed Overall Cognitive Status: No family/caregiver present to determine baseline cognitive functioning                                 General Comments: pt pleasant but somewhat impulsive with mobility; appears to have some decreased insight into current deficits and need for assist.      Exercises      General Comments        Pertinent Vitals/Pain Pain Assessment: Faces Faces Pain Scale: Hurts a little bit Pain Location: abdomen Pain Descriptors / Indicators: Discomfort;Grimacing Pain Intervention(s): Monitored during session;Repositioned  Home Living                      Prior Function            PT Goals (current goals can now be found in the care plan section) Acute Rehab PT Goals Patient Stated Goal: get better; wants to go  home Potential to Achieve Goals: Fair    Frequency    Min 3X/week      PT Plan Current plan remains appropriate    Co-evaluation PT/OT/SLP Co-Evaluation/Treatment: Yes Reason for Co-Treatment: Complexity of the patient's impairments (multi-system involvement);Necessary to address cognition/behavior during functional activity;For patient/therapist safety PT goals addressed during session: Mobility/safety with mobility OT goals addressed during session: ADL's and self-care      AM-PAC PT "6 Clicks" Mobility   Outcome Measure  Help needed turning from your back to your side while in a flat bed without using bedrails?: A Little Help needed moving from lying on your back to sitting on the side of a flat bed without using bedrails?: A Little Help needed moving to and from a bed to a chair (including a wheelchair)?: A Little Help needed standing up from a chair using your arms (e.g., wheelchair or bedside chair)?: A Little Help needed to walk in hospital room?: A Little Help needed climbing 3-5 steps with a railing? : A Lot 6 Click Score: 17    End of Session Equipment Utilized During Treatment: Gait belt Activity Tolerance: Patient tolerated treatment well;Patient limited by fatigue Patient left: in chair;with call bell/phone within reach;with chair alarm set Nurse Communication: Mobility status PT Visit Diagnosis: Unsteadiness on feet (R26.81);Difficulty in walking, not elsewhere classified (R26.2);Pain     Time: 1111-1135 PT Time Calculation (min) (ACUTE ONLY): 24 min  Charges:  $Gait Training: 8-22 mins                     Tristan Hughes, PTA Acute Rehabilitation Services Pager [phone] Office [phone]     Tristan Hughes Tristan Hughes 10/15/2018, 2:51 PM

## 2018-10-15 NOTE — Progress Notes (Signed)
Progress Note: General Surgery Service   Assessment/Plan: Active Problems:   Gastric carcinoma (HCC)   Primary adenocarcinoma of cardia of stomach (HCC)  s/p Procedure(s): LAPAROSCOPY DIAGNOSTIC JEJUNOSTOMY TUBE PLACEMENT PYLOROPLASTY  OPEN PROXIMAL GASTRECTOMY  OPERATIVE ULTRASOUND 10/01/2018  ABL anemia -  Fever - improved. UGI shows probable small leak. NGt no longer putting out much. Will continue npo.   Agree with patient and family to work toward home.   Care management consult.  Goal home Friday barring unforeseen circumstances.   His wife and daughter can be there for him and they want to minimize risk of exposure to covid.    nutrition consult - tube feeds at goal. Having some flatus Increase lantus for DM.  Ok for LIQUID MEDS ONLY via J tube. No crushed meds.  Low dose Metoprolol for HTN/HR via J tube  Moderate protein calorie malnutrition. OOB, pulm toilet  Lovenox.  Oxycodone via J tube prn for addl pain control standing.  Low dose ativan PRN for anxiety   LOS: 14 days  Chief Complaint/Subjective: Feels much better with NGT out.    Objective: Vital signs in last 24 hours: Temp:  [99 F (37.2 C)-99.5 F (37.5 C)] 99.1 F (37.3 C) (06/16 0521) Pulse Rate:  [82-96] 96 (06/16 0521) Resp:  [18-20] 18 (06/16 0521) BP: (131-158)/(55-67) 151/67 (06/16 0521) SpO2:  [92 %-95 %] 92 % (06/16 0521) Weight:  [94.9 kg] 94.9 kg (06/15 1957) Last BM Date: 10/14/18  Intake/Output from previous day: 06/15 0701 - 06/16 0700 In: 0  Out: 650 [Urine:650] Intake/Output this shift: No intake/output data recorded.  Lungs: nonlabored  Cardiovascular: RRR  Abd: soft, incision c/d/i, right drain with dark fluid   Extremities: no edema  Neuro: AOx4  Lab Results: CBC  Recent Labs    10/14/18 0424  WBC 12.8*  HGB 7.5*  HCT 24.4*  PLT 731*   BMET Recent Labs    10/14/18 0424  NA 143  K 3.8  CL 110  CO2 25  GLUCOSE 244*  BUN 21  CREATININE  0.94  CALCIUM 7.7*   PT/INR No results for input(s): LABPROT, INR in the last 72 hours. ABG No results for input(s): PHART, HCO3 in the last 72 hours.  Invalid input(s): PCO2, PO2  Studies/Results:  Anti-infectives: Anti-infectives (From admission, onward)   Start     Dose/Rate Route Frequency Ordered Stop   10/12/18 1800  vancomycin (VANCOCIN) 2,000 mg in sodium chloride 0.9 % 500 mL IVPB     2,000 mg 250 mL/hr over 120 Minutes Intravenous Every 24 hours 10/12/18 0958     10/09/18 1800  vancomycin (VANCOCIN) 1,500 mg in sodium chloride 0.9 % 500 mL IVPB  Status:  Discontinued     1,500 mg 250 mL/hr over 120 Minutes Intravenous Every 24 hours 10/08/18 1648 10/12/18 0957   10/09/18 0200  piperacillin-tazobactam (ZOSYN) IVPB 3.375 g     3.375 g 12.5 mL/hr over 240 Minutes Intravenous Every 8 hours 10/08/18 1648     10/08/18 1700  vancomycin (VANCOCIN) 1,500 mg in sodium chloride 0.9 % 500 mL IVPB     1,500 mg 250 mL/hr over 120 Minutes Intravenous  Once 10/08/18 1648 10/08/18 1924   10/08/18 1645  piperacillin-tazobactam (ZOSYN) IVPB 3.375 g     3.375 g 100 mL/hr over 30 Minutes Intravenous  Once 10/08/18 1638 10/08/18 1749   10/01/18 1600  ceFAZolin (ANCEF) IVPB 2g/100 mL premix     2 g 200 mL/hr over 30 Minutes Intravenous Every  8 hours 10/01/18 1539 10/01/18 1759   10/01/18 0631  ceFAZolin (ANCEF) 2-4 GM/100ML-% IVPB    Note to Pharmacy: Marga Melnick   : cabinet override      10/01/18 0631 10/01/18 0814   10/01/18 0630  ceFAZolin (ANCEF) IVPB 2g/100 mL premix     2 g 200 mL/hr over 30 Minutes Intravenous On call to O.R. 10/01/18 5852 10/01/18 7782      Medications: Scheduled Meds: . sodium chloride   Intravenous Once  . bisacodyl  10 mg Rectal Daily  . chlorhexidine  15 mL Mouth Rinse BID  . Chlorhexidine Gluconate Cloth  6 each Topical Daily  . docusate  100 mg Per Tube Daily  . enoxaparin (LOVENOX) injection  40 mg Subcutaneous Q24H  . feeding supplement (PIVOT  1.5 CAL)  1,000 mL Per Tube Q24H  . insulin aspart  0-20 Units Subcutaneous Q4H  . insulin glargine  40 Units Subcutaneous Daily  . mouth rinse  15 mL Mouth Rinse q12n4p  . metoprolol tartrate  12.5 mg Per Tube BID  . oxyCODONE  5 mg Per Tube Q8H  . pantoprazole sodium  40 mg Per Tube QHS  . sodium chloride flush  10-40 mL Intracatheter Q12H   Continuous Infusions: . sodium chloride Stopped (10/11/18 1123)  . dextrose 5 % and 0.45 % NaCl with KCl 20 mEq/L 20 mL/hr at 10/13/18 1317  . methocarbamol (ROBAXIN) IV Stopped (10/07/18 2047)  . piperacillin-tazobactam (ZOSYN)  IV 3.375 g (10/15/18 0951)  . vancomycin 2,000 mg (10/14/18 1851)   PRN Meds:.sodium chloride, acetaminophen (TYLENOL) oral liquid 160 mg/5 mL, albuterol, hydrALAZINE, HYDROmorphone (DILAUDID) injection, LORazepam, methocarbamol (ROBAXIN) IV, prochlorperazine **OR** prochlorperazine, sodium chloride flush  Stark Klein, MD [facility]

## 2018-10-15 NOTE — Progress Notes (Signed)
Discussed potential plan with daughter of home Friday with [facility] RN and PT with tube feeds and JP drain.

## 2018-10-15 NOTE — Progress Notes (Signed)
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 Pt PMH including Anxiety, CAD (07/09/2009), Depression, Diabetes, HTN, OSA.   OT comments  Pt slowly progressing toward OT goals. Pt tolerated session well however some complaints of fatigue and required rest throughout ADL task. Pt required Min A bed mobility, Min guard to Min A functional mobility to sink for safety, Min guard to Min A in standing for sink level task with set up. Pt performed brushing hair and washing face in standing while oral care in sitting on BSC. Pt will benefit from continued therapy to address strength and activity tolerance for safe transition  to next venue. DC and freq remains the same OT will continue to follow acutely.   Follow Up Recommendations  CIR;Supervision/Assistance - 24 hour    Equipment Recommendations  3 in 1 bedside commode    Recommendations for Other Services Rehab consult    Precautions / Restrictions Precautions Precautions: Other (comment);Fall Precaution Comments: lots of abdominal incisions, drains, peg tube Restrictions Weight Bearing Restrictions: No Other Position/Activity Restrictions: NG tube removed 10/14/2018       Mobility Bed Mobility Overal bed mobility: Needs Assistance Bed Mobility: Rolling;Sidelying to Sit Rolling: Modified independent (Device/Increase time) Sidelying to sit: Mod assist;Min assist       General bed mobility comments: Pt required assistance to elevate trunk into sitting.  PT present to manage lines and leads  Transfers Overall transfer level: Needs assistance Equipment used: Rolling walker (2 wheeled) Transfers: Sit to/from  Stand Sit to Stand: Supervision         General transfer comment: Cues for hand placement to push into standing.      Balance Overall balance assessment: Needs assistance Sitting-balance support: Feet supported;No upper extremity supported Sitting balance-Leahy Scale: Fair     Standing balance support: Bilateral upper extremity supported;During functional activity Standing balance-Leahy Scale: Poor Standing balance comment: dependent on physical assist and RW. + dizziness in standing. BP and other VSS stable.                           ADL either performed or assessed with clinical judgement   ADL Overall ADL's : Needs assistance/impaired Eating/Feeding: NPO   Grooming: Sitting;Set up;Standing;Oral care;Wash/dry face;Brushing hair;Min guard(Initially in standing progressed to sitting due to fatigue) Grooming Details (indicate cue type and reason): Brushed teeth while seated on BSC. Min gaurd in standing for safetyl.                             Functional mobility during ADLs: +2 for safety/equipment;Rolling walker;Minimal assistance       Vision       Perception     Praxis      Cognition Arousal/Alertness: Awake/alert Behavior During Therapy: Impulsive;WFL for tasks assessed/performed Overall Cognitive Status: No family/caregiver present to determine baseline cognitive functioning                                 General Comments: pt pleasant but somewhat impulsive with mobility; appears to have some decreased insight into current deficits and need for assist.  Exercises     Shoulder Instructions       General Comments      Pertinent Vitals/ Pain       Pain Assessment: Faces Faces Pain Scale: Hurts a little bit Pain Location: abdomen Pain Descriptors / Indicators: Discomfort;Grimacing Pain Intervention(s): Monitored during session;Repositioned  Home Living                                           Prior Functioning/Environment              Frequency  Min 2X/week        Progress Toward Goals  OT Goals(current goals can now be found in the care plan section)  Progress towards OT goals: Progressing toward goals  Acute Rehab OT Goals Patient Stated Goal: get better; wants to go home OT Goal Formulation: With patient Time For Goal Achievement: 10/23/18 Potential to Achieve Goals: Good ADL Goals Pt Will Perform Grooming: with supervision;standing Pt Will Perform Upper Body Dressing: with modified independence;sitting Pt Will Perform Lower Body Dressing: sit to/from stand;with min assist;with caregiver independent in assisting;with adaptive equipment Pt Will Transfer to Toilet: with supervision Pt Will Perform Toileting - Clothing Manipulation and hygiene: with modified independence;sit to/from stand Additional ADL Goal #1: Pt will perform bed mobility (log roll technique) at supervision level prior to engaging in ADL  Plan Discharge plan remains appropriate    Co-evaluation    PT/OT/SLP Co-Evaluation/Treatment: Yes Reason for Co-Treatment: For patient/therapist safety;To address functional/ADL transfers;Complexity of the patient's impairments (multi-system involvement)   OT goals addressed during session: ADL's and self-care      AM-PAC OT "6 Clicks" Daily Activity     Outcome Measure   Help from another person eating meals?: Total(NPO) Help from another person taking care of personal grooming?: A Little Help from another person toileting, which includes using toliet, bedpan, or urinal?: A Lot Help from another person bathing (including washing, rinsing, drying)?: A Lot Help from another person to put on and taking off regular upper body clothing?: A Lot Help from another person to put on and taking off regular lower body clothing?: A Lot 6 Click Score: 12    End of Session Equipment Utilized During Treatment: Gait belt;Rolling walker  OT Visit  Diagnosis: Unsteadiness on feet (R26.81);Other abnormalities of gait and mobility (R26.89);Muscle weakness (generalized) (M62.81)   Activity Tolerance Patient tolerated treatment well;Patient limited by fatigue   Patient Left in chair;with call bell/phone within reach;with chair alarm set   Nurse Communication Mobility status        Time: 1111-1140 OT Time Calculation (min): 29 min  Charges: OT General Charges $OT Visit: 1 Visit OT Treatments $Self Care/Home Management : 8-22 mins  Minus Breeding, MSOT, OTR/L  Supplemental Rehabilitation Services  [phone]    Marius Ditch 10/15/2018, 12:01 PM

## 2018-10-15 NOTE — Progress Notes (Signed)
Pt was noted to be coughing continuously with yellowish phlegm, slightly short of breath, 93% O2 sat on RA, temp is 99, called Dr. Redmond Pulling to notify with order to give albuterol nebulization. Encouraged to use IS, pt able to reach 1250. Will continue monitoring.

## 2018-10-15 NOTE — Progress Notes (Addendum)
Nutrition Follow-up  DOCUMENTATION CODES:   Not applicable  INTERVENTION:   -D/c Pivot 1.5 -Osmolite 1.5 @ 80 ml/hr via j-tube over 18 hour period  30 ml Prostat BID  150 ml free water flush every 8 hours  Tube feeding regimen provides 2360 kcal (100% of needs), 120 grams of protein, and 1997 ml of H2O.   NUTRITION DIAGNOSIS:   Increased nutrient needs related to cancer and cancer related treatments as evidenced by estimated needs.  Ongoing  GOAL:   Patient will meet greater than or equal to 90% of their needs  Progressing  MONITOR:   TF tolerance, Labs  REASON FOR ASSESSMENT:   Consult Enteral/tube feeding initiation and management  ASSESSMENT:   Pt with PMH of HTN, HLD, GERD, DM, CAD and gastric cancer dx 03/2018 s/p chemo (FOLFOX) who is now s/p diagnostic laparoscopy, proximal gastrectomy with esophagogastrostomy, pyloroplasty, jejunostomy tube placement, intraoperative ultrasound, distal pancreatectomy, and splenectomy on 6/2.   6/2- s/p Procedure(s): Diagnostic laparoscopy, proximal gastrectomy with esophagogastrostomy, pyloroplasty, jejunostomy tube placement, intraoperative ultrasound, distal pancreatectomy, splenectomy 6/4- trickle feeds initiated 6/8- TF at goal 6/11- UGI reveals probable small leak 6/13- transferred from PCU to floor  6/15- NGT d/c  Reviewed I/O's: -650 ml x24 hurs and +8 L since admission  UOP: 650 ml x 24 hours  Attempted to visit with pt, however, pt receiving nursing care at time of visit.   Received message from [facility], who is request verification for home TF orders (plan to d/c home with family on Friday and will be taught how to administer TF before discharge). Per chart, Pivot 1.5 infusing via j-tube @ 65 ml/hr, however, RNCM is concerned that order is set for every 24 hours instead of continuous and suspect pt has only been receiving one bottle of TF daily (which provides 1500 kcals, 94 grams protein, and 759 ml free water  daily, meeting 65% of estimated kcal needs and 79% of estimated protein needs). Since pt will be discharging home and has transitioned out of the ICU, will transition to a standard formula.   Per RNCM, MD would like to send pt home on nocturnal feeds of 80 ml/hr for 18 hours.   Labs reviewed: CBGS: [id number] (inpatient orders for glycemic control are 0-20 units insulin aspart every 4 hours and 45 units insulin glargine daily).   Diet Order:   Diet Order            Diet NPO time specified Except for: Ice Chips  Diet effective now              EDUCATION NEEDS:   No education needs have been identified at this time  Skin:  Skin Assessment: Skin Integrity Issues: Skin Integrity Issues:: Incisions Incisions: closed abdomen  Last BM:  10/15/18  Height:   Ht Readings from Last 1 Encounters:  10/01/18 5\' 9"  (1.753 m)    Weight:   Wt Readings from Last 1 Encounters:  10/14/18 94.9 kg    Ideal Body Weight:  72.7 kg  BMI:  Body mass index is 30.9 kg/m.  Estimated Nutritional Needs:   Kcal:  2300-2500  Protein:  120-135 grams  Fluid:  >2 L/day   Justin Meisenheimer A. Jimmye Norman, RD, LDN, [place] Registered Dietitian II Certified Diabetes Care and Education Specialist Pager: [phone] After hours Pager: [phone].

## 2018-10-15 NOTE — Care Management Important Message (Signed)
Important Message  Patient Details  Name: Tristan Hughes MRN: [id number] Date of Birth: [birth year]   Medicare Important Message Given:  Yes    Memory Argue 10/15/2018, 12:28 PM

## 2018-10-16 ENCOUNTER — Inpatient Hospital Stay (HOSPITAL_COMMUNITY): Payer: Medicare Other

## 2018-10-16 ENCOUNTER — Encounter (HOSPITAL_COMMUNITY)
Admission: AD | Disposition: A | Discharge status: Hospice | Payer: Self-pay | Source: Home / Self Care | Attending: General Surgery

## 2018-10-16 ENCOUNTER — Inpatient Hospital Stay (HOSPITAL_COMMUNITY): Payer: Medicare Other | Admitting: Registered Nurse

## 2018-10-16 DIAGNOSIS — J9601 Acute respiratory failure with hypoxia: Secondary | ICD-10-CM

## 2018-10-16 DIAGNOSIS — I469 Cardiac arrest, cause unspecified: Secondary | ICD-10-CM

## 2018-10-16 HISTORY — PX: GASTRECTOMY: SHX58

## 2018-10-16 HISTORY — PX: APPLICATION OF WOUND VAC: SHX5189

## 2018-10-16 HISTORY — PX: LAPAROTOMY: SHX154

## 2018-10-16 LAB — POCT I-STAT 7, (LYTES, BLD GAS, ICA,H+H)
Acid-base deficit: 4 mmol/L — ABNORMAL HIGH (ref 0.0–2.0)
Bicarbonate: 23.5 mmol/L (ref 20.0–28.0)
Calcium, Ion: 1.12 mmol/L — ABNORMAL LOW (ref 1.15–1.40)
HCT: 29 % — ABNORMAL LOW (ref 39.0–52.0)
Hemoglobin: 9.9 g/dL — ABNORMAL LOW (ref 13.0–17.0)
O2 Saturation: 94 %
Patient temperature: 102
Potassium: 3 mmol/L — ABNORMAL LOW (ref 3.5–5.1)
Sodium: 147 mmol/L — ABNORMAL HIGH (ref 135–145)
TCO2: 25 mmol/L (ref 22–32)
pCO2 arterial: 55.5 mmHg — ABNORMAL HIGH (ref 32.0–48.0)
pH, Arterial: 7.244 — ABNORMAL LOW (ref 7.350–7.450)
pO2, Arterial: 95 mmHg (ref 83.0–108.0)

## 2018-10-16 LAB — CBC WITH DIFFERENTIAL/PLATELET
Abs Immature Granulocytes: 0.59 10*3/uL — ABNORMAL HIGH (ref 0.00–0.07)
Basophils Absolute: 0.1 10*3/uL (ref 0.0–0.1)
Basophils Relative: 0 %
Eosinophils Absolute: 0 10*3/uL (ref 0.0–0.5)
Eosinophils Relative: 0 %
HCT: 28.9 % — ABNORMAL LOW (ref 39.0–52.0)
Hemoglobin: 8.6 g/dL — ABNORMAL LOW (ref 13.0–17.0)
Immature Granulocytes: 2 %
Lymphocytes Relative: 3 %
Lymphs Abs: 0.8 10*3/uL (ref 0.7–4.0)
MCH: 28.2 pg (ref 26.0–34.0)
MCHC: 29.8 g/dL — ABNORMAL LOW (ref 30.0–36.0)
MCV: 94.8 fL (ref 80.0–100.0)
Monocytes Absolute: 0.3 10*3/uL (ref 0.1–1.0)
Monocytes Relative: 1 %
Neutro Abs: 23.3 10*3/uL — ABNORMAL HIGH (ref 1.7–7.7)
Neutrophils Relative %: 94 %
Platelets: 568 10*3/uL — ABNORMAL HIGH (ref 150–400)
RBC: 3.05 MIL/uL — ABNORMAL LOW (ref 4.22–5.81)
RDW: 16 % — ABNORMAL HIGH (ref 11.5–15.5)
WBC: 25 10*3/uL — ABNORMAL HIGH (ref 4.0–10.5)
nRBC: 1.3 % — ABNORMAL HIGH (ref 0.0–0.2)

## 2018-10-16 LAB — BASIC METABOLIC PANEL
Anion gap: 11 (ref 5–15)
BUN: 15 mg/dL (ref 8–23)
CO2: 23 mmol/L (ref 22–32)
Calcium: 8.1 mg/dL — ABNORMAL LOW (ref 8.9–10.3)
Chloride: 111 mmol/L (ref 98–111)
Creatinine, Ser: 0.96 mg/dL (ref 0.61–1.24)
GFR calc Af Amer: >60 mL/min (ref >60)
GFR calc non Af Amer: >60 mL/min (ref >60)
Glucose, Bld: 172 mg/dL — ABNORMAL HIGH (ref 70–99)
Potassium: 3.5 mmol/L (ref 3.5–5.1)
Sodium: 145 mmol/L (ref 135–145)

## 2018-10-16 LAB — CBC
HCT: 26 % — ABNORMAL LOW (ref 39.0–52.0)
HCT: 26.7 % — ABNORMAL LOW (ref 39.0–52.0)
HCT: 29.1 % — ABNORMAL LOW (ref 39.0–52.0)
Hemoglobin: 7.9 g/dL — ABNORMAL LOW (ref 13.0–17.0)
Hemoglobin: 7.8 g/dL — ABNORMAL LOW (ref 13.0–17.0)
Hemoglobin: 8.9 g/dL — ABNORMAL LOW (ref 13.0–17.0)
MCH: 27.7 pg (ref 26.0–34.0)
MCH: 27.7 pg (ref 26.0–34.0)
MCH: 28.3 pg (ref 26.0–34.0)
MCHC: 30.4 g/dL (ref 30.0–36.0)
MCHC: 29.2 g/dL — ABNORMAL LOW (ref 30.0–36.0)
MCHC: 30.6 g/dL (ref 30.0–36.0)
MCV: 91.2 fL (ref 80.0–100.0)
MCV: 94.7 fL (ref 80.0–100.0)
MCV: 92.4 fL (ref 80.0–100.0)
Platelets: 794 10*3/uL — ABNORMAL HIGH (ref 150–400)
Platelets: 698 10*3/uL — ABNORMAL HIGH (ref 150–400)
Platelets: 595 10*3/uL — ABNORMAL HIGH (ref 150–400)
RBC: 2.85 MIL/uL — ABNORMAL LOW (ref 4.22–5.81)
RBC: 2.82 MIL/uL — ABNORMAL LOW (ref 4.22–5.81)
RBC: 3.15 MIL/uL — ABNORMAL LOW (ref 4.22–5.81)
RDW: 15.4 % (ref 11.5–15.5)
RDW: 15.4 % (ref 11.5–15.5)
RDW: 15.7 % — ABNORMAL HIGH (ref 11.5–15.5)
WBC: 19.6 10*3/uL — ABNORMAL HIGH (ref 4.0–10.5)
WBC: 17.8 10*3/uL — ABNORMAL HIGH (ref 4.0–10.5)
WBC: 5.9 10*3/uL (ref 4.0–10.5)
nRBC: 1.1 % — ABNORMAL HIGH (ref 0.0–0.2)
nRBC: 1.4 % — ABNORMAL HIGH (ref 0.0–0.2)
nRBC: 8.4 % — ABNORMAL HIGH (ref 0.0–0.2)

## 2018-10-16 LAB — COMPREHENSIVE METABOLIC PANEL
ALT: 20 U/L (ref 0–44)
ALT: 34 U/L (ref 0–44)
AST: 29 U/L (ref 15–41)
AST: 53 U/L — ABNORMAL HIGH (ref 15–41)
Albumin: 2 g/dL — ABNORMAL LOW (ref 3.5–5.0)
Albumin: 1.7 g/dL — ABNORMAL LOW (ref 3.5–5.0)
Alkaline Phosphatase: 93 U/L (ref 38–126)
Alkaline Phosphatase: 64 U/L (ref 38–126)
Anion gap: 15 (ref 5–15)
Anion gap: 14 (ref 5–15)
BUN: 18 mg/dL (ref 8–23)
BUN: 22 mg/dL (ref 8–23)
CO2: 23 mmol/L (ref 22–32)
CO2: 19 mmol/L — ABNORMAL LOW (ref 22–32)
Calcium: 7.7 mg/dL — ABNORMAL LOW (ref 8.9–10.3)
Calcium: 7.1 mg/dL — ABNORMAL LOW (ref 8.9–10.3)
Chloride: 108 mmol/L (ref 98–111)
Chloride: 112 mmol/L — ABNORMAL HIGH (ref 98–111)
Creatinine, Ser: 1.36 mg/dL — ABNORMAL HIGH (ref 0.61–1.24)
Creatinine, Ser: 1.75 mg/dL — ABNORMAL HIGH (ref 0.61–1.24)
GFR calc Af Amer: 58 mL/min — ABNORMAL LOW (ref >60)
GFR calc Af Amer: 43 mL/min — ABNORMAL LOW (ref >60)
GFR calc non Af Amer: 50 mL/min — ABNORMAL LOW (ref >60)
GFR calc non Af Amer: 37 mL/min — ABNORMAL LOW (ref >60)
Glucose, Bld: 202 mg/dL — ABNORMAL HIGH (ref 70–99)
Glucose, Bld: 123 mg/dL — ABNORMAL HIGH (ref 70–99)
Potassium: 3.1 mmol/L — ABNORMAL LOW (ref 3.5–5.1)
Potassium: 4 mmol/L (ref 3.5–5.1)
Sodium: 146 mmol/L — ABNORMAL HIGH (ref 135–145)
Sodium: 145 mmol/L (ref 135–145)
Total Bilirubin: 0.7 mg/dL (ref 0.3–1.2)
Total Bilirubin: 0.9 mg/dL (ref 0.3–1.2)
Total Protein: 5.3 g/dL — ABNORMAL LOW (ref 6.5–8.1)
Total Protein: 5 g/dL — ABNORMAL LOW (ref 6.5–8.1)

## 2018-10-16 LAB — BLOOD GAS, ARTERIAL
Acid-Base Excess: 2.3 mmol/L — ABNORMAL HIGH (ref 0.0–2.0)
Acid-base deficit: 6.7 mmol/L — ABNORMAL HIGH (ref 0.0–2.0)
Acid-base deficit: 8.3 mmol/L — ABNORMAL HIGH (ref 0.0–2.0)
Bicarbonate: 26.4 mmol/L (ref 20.0–28.0)
Bicarbonate: 18.5 mmol/L — ABNORMAL LOW (ref 20.0–28.0)
Bicarbonate: 17.3 mmol/L — ABNORMAL LOW (ref 20.0–28.0)
Drawn by: 398981
Drawn by: 331761
Drawn by: 252031
FIO2: 100
FIO2: 100
FIO2: 100
MECHVT: 560 mL
MECHVT: 560 mL
O2 Saturation: 99.1 %
O2 Saturation: 96.2 %
O2 Saturation: 95.5 %
PEEP: 12 cmH2O
PEEP: 12 cmH2O
Patient temperature: 102.8
Patient temperature: 101.9
Patient temperature: 98.6
RATE: 34 resp/min
RATE: 34 resp/min
pCO2 arterial: 46.8 mmHg (ref 32.0–48.0)
pCO2 arterial: 41.4 mmHg (ref 32.0–48.0)
pCO2 arterial: 38.5 mmHg (ref 32.0–48.0)
pH, Arterial: 7.383 (ref 7.350–7.450)
pH, Arterial: 7.283 — ABNORMAL LOW (ref 7.350–7.450)
pH, Arterial: 7.274 — ABNORMAL LOW (ref 7.350–7.450)
pO2, Arterial: 181 mmHg — ABNORMAL HIGH (ref 83.0–108.0)
pO2, Arterial: 107 mmHg (ref 83.0–108.0)
pO2, Arterial: 88.3 mmHg (ref 83.0–108.0)

## 2018-10-16 LAB — TROPONIN I
Troponin I: 0.28 ng/mL (ref <0.03)
Troponin I: 0.64 ng/mL (ref <0.03)
Troponin I: 0.38 ng/mL (ref <0.03)

## 2018-10-16 LAB — GLUCOSE, CAPILLARY
Glucose-Capillary: 101 mg/dL — ABNORMAL HIGH (ref 70–99)
Glucose-Capillary: 108 mg/dL — ABNORMAL HIGH (ref 70–99)
Glucose-Capillary: 118 mg/dL — ABNORMAL HIGH (ref 70–99)
Glucose-Capillary: 130 mg/dL — ABNORMAL HIGH (ref 70–99)
Glucose-Capillary: 183 mg/dL — ABNORMAL HIGH (ref 70–99)
Glucose-Capillary: 191 mg/dL — ABNORMAL HIGH (ref 70–99)

## 2018-10-16 LAB — VANCOMYCIN, TROUGH: Vancomycin Tr: 12 ug/mL — ABNORMAL LOW (ref 15–20)

## 2018-10-16 LAB — VANCOMYCIN, PEAK: Vancomycin Pk: 31 ug/mL (ref 30–40)

## 2018-10-16 LAB — PROTIME-INR
INR: 1.4 — ABNORMAL HIGH (ref 0.8–1.2)
Prothrombin Time: 16.7 seconds — ABNORMAL HIGH (ref 11.4–15.2)

## 2018-10-16 LAB — LACTIC ACID, PLASMA
Lactic Acid, Venous: 5.7 mmol/L (ref 0.5–1.9)
Lactic Acid, Venous: 7.7 mmol/L (ref 0.5–1.9)
Lactic Acid, Venous: 8.9 mmol/L (ref 0.5–1.9)

## 2018-10-16 LAB — HEMOGLOBIN AND HEMATOCRIT, BLOOD
HCT: 31.8 % — ABNORMAL LOW (ref 39.0–52.0)
Hemoglobin: 9.3 g/dL — ABNORMAL LOW (ref 13.0–17.0)

## 2018-10-16 LAB — PREPARE RBC (CROSSMATCH)

## 2018-10-16 SURGERY — LAPAROTOMY, EXPLORATORY
Anesthesia: General | Site: Abdomen

## 2018-10-16 MED ORDER — SODIUM CHLORIDE 0.9 % IV BOLUS
1000.0000 mL | Freq: Once | INTRAVENOUS | Status: AC
  Administered 2018-10-16: 1000 mL via INTRAVENOUS

## 2018-10-16 MED ORDER — DEXMEDETOMIDINE HCL IN NACL 200 MCG/50ML IV SOLN
0.0000 ug/kg/h | INTRAVENOUS | Status: DC
  Administered 2018-10-16: 11:00:00 0.4 ug/kg/h via INTRAVENOUS
  Filled 2018-10-16: qty 50

## 2018-10-16 MED ORDER — FENTANYL CITRATE (PF) 100 MCG/2ML IJ SOLN
INTRAMUSCULAR | Status: AC
  Administered 2018-10-16: 09:00:00 100 ug via INTRAVENOUS
  Filled 2018-10-16: qty 2

## 2018-10-16 MED ORDER — ACETAMINOPHEN 10 MG/ML IV SOLN
INTRAVENOUS | Status: AC
  Filled 2018-10-16: qty 100

## 2018-10-16 MED ORDER — MIDAZOLAM 50MG/50ML (1MG/ML) PREMIX INFUSION
2.0000 mg/h | INTRAVENOUS | Status: DC
  Administered 2018-10-16: 2 mg/h via INTRAVENOUS
  Administered 2018-10-17 – 2018-10-18 (×2): 3 mg/h via INTRAVENOUS
  Administered 2018-10-19: 2 mg/h via INTRAVENOUS
  Filled 2018-10-16 (×5): qty 50

## 2018-10-16 MED ORDER — CITALOPRAM HYDROBROMIDE 10 MG PO TABS
10.0000 mg | ORAL_TABLET | Freq: Every day | ORAL | Status: DC
  Filled 2018-10-16: qty 1

## 2018-10-16 MED ORDER — SODIUM CHLORIDE 0.9 % IV SOLN
8.0000 mg/h | INTRAVENOUS | Status: AC
  Administered 2018-10-16 – 2018-10-18 (×5): 8 mg/h via INTRAVENOUS
  Filled 2018-10-16 (×7): qty 80

## 2018-10-16 MED ORDER — POTASSIUM CHLORIDE 10 MEQ/50ML IV SOLN: 10.0000 meq | INTRAVENOUS | Status: DC

## 2018-10-16 MED ORDER — LACTATED RINGERS IV SOLN
INTRAVENOUS | Status: DC | PRN
  Administered 2018-10-16: 18:00:00 via INTRAVENOUS

## 2018-10-16 MED ORDER — ROCURONIUM BROMIDE 50 MG/5ML IV SOLN
80.0000 mg | Freq: Once | INTRAVENOUS | Status: AC
  Administered 2018-10-16: 80 mg via INTRAVENOUS
  Filled 2018-10-16: qty 8

## 2018-10-16 MED ORDER — DEXAMETHASONE SODIUM PHOSPHATE 10 MG/ML IJ SOLN
INTRAMUSCULAR | Status: DC | PRN
  Administered 2018-10-16: 5 mg via INTRAVENOUS

## 2018-10-16 MED ORDER — FENTANYL 2500MCG IN NS 250ML (10MCG/ML) PREMIX INFUSION
25.0000 ug/h | INTRAVENOUS | Status: DC
  Administered 2018-10-16: 100 ug/h via INTRAVENOUS
  Administered 2018-10-17: 03:00:00 150 ug/h via INTRAVENOUS
  Administered 2018-10-17: 175 ug/h via INTRAVENOUS
  Administered 2018-10-18: 12:00:00 125 ug/h via INTRAVENOUS
  Filled 2018-10-16 (×3): qty 250

## 2018-10-16 MED ORDER — SODIUM CHLORIDE 0.9 % IV BOLUS
1000.0000 mL | Freq: Once | INTRAVENOUS | Status: AC
  Administered 2018-10-16: 15:00:00 1000 mL via INTRAVENOUS

## 2018-10-16 MED ORDER — FENTANYL CITRATE (PF) 250 MCG/5ML IJ SOLN
INTRAMUSCULAR | Status: AC
  Filled 2018-10-16: qty 5

## 2018-10-16 MED ORDER — NOREPINEPHRINE 16 MG/250ML-% IV SOLN
0.0000 ug/min | INTRAVENOUS | Status: DC
  Administered 2018-10-16 (×2): 50 ug/min via INTRAVENOUS
  Administered 2018-10-17: 13 ug/min via INTRAVENOUS
  Filled 2018-10-16 (×4): qty 250

## 2018-10-16 MED ORDER — CHLORHEXIDINE GLUCONATE 0.12% ORAL RINSE (MEDLINE KIT)
15.0000 mL | Freq: Two times a day (BID) | OROMUCOSAL | Status: DC
  Administered 2018-10-17 – 2018-10-26 (×19): 15 mL via OROMUCOSAL

## 2018-10-16 MED ORDER — VANCOMYCIN VARIABLE DOSE PER UNSTABLE RENAL FUNCTION (PHARMACIST DOSING): Status: DC

## 2018-10-16 MED ORDER — FENTANYL 2500MCG IN NS 250ML (10MCG/ML) PREMIX INFUSION
25.0000 ug/h | INTRAVENOUS | Status: DC
  Filled 2018-10-16: qty 250

## 2018-10-16 MED ORDER — POTASSIUM CHLORIDE 10 MEQ/100ML IV SOLN
10.0000 meq | INTRAVENOUS | Status: AC
  Administered 2018-10-16: 10 meq via INTRAVENOUS
  Filled 2018-10-16 (×3): qty 100

## 2018-10-16 MED ORDER — ORAL CARE MOUTH RINSE
15.0000 mL | OROMUCOSAL | Status: DC
  Administered 2018-10-16 – 2018-10-26 (×95): 15 mL via OROMUCOSAL

## 2018-10-16 MED ORDER — NOREPINEPHRINE 4 MG/250ML-% IV SOLN
0.0000 ug/min | INTRAVENOUS | Status: DC
  Administered 2018-10-16 (×2): 40 ug/min via INTRAVENOUS
  Administered 2018-10-16: 15 ug/min via INTRAVENOUS
  Filled 2018-10-16 (×2): qty 250

## 2018-10-16 MED ORDER — SODIUM CHLORIDE 0.9 % IV SOLN
INTRAVENOUS | Status: DC
  Administered 2018-10-16: 16:00:00 via INTRAVENOUS

## 2018-10-16 MED ORDER — MIDAZOLAM BOLUS VIA INFUSION
1.0000 mg | INTRAVENOUS | Status: DC | PRN
  Filled 2018-10-16: qty 2

## 2018-10-16 MED ORDER — SODIUM CHLORIDE 0.9% IV SOLUTION
Freq: Once | INTRAVENOUS | Status: DC
  Administered 2018-10-16: 10:00:00 via INTRAVENOUS

## 2018-10-16 MED ORDER — FUROSEMIDE 10 MG/ML IJ SOLN
20.0000 mg | Freq: Once | INTRAMUSCULAR | Status: AC
  Administered 2018-10-16: 07:00:00 20 mg via INTRAVENOUS
  Filled 2018-10-16: qty 2

## 2018-10-16 MED ORDER — MIDAZOLAM HCL 2 MG/2ML IJ SOLN
2.0000 mg | Freq: Once | INTRAMUSCULAR | Status: AC
  Administered 2018-10-16: 09:00:00 2 mg via INTRAVENOUS

## 2018-10-16 MED ORDER — FENTANYL BOLUS VIA INFUSION
25.0000 ug | INTRAVENOUS | Status: DC | PRN
  Filled 2018-10-16: qty 25

## 2018-10-16 MED ORDER — CHLORHEXIDINE GLUCONATE CLOTH 2 % EX PADS: 6.0000 | MEDICATED_PAD | Freq: Every day | CUTANEOUS | Status: DC

## 2018-10-16 MED ORDER — 0.9 % SODIUM CHLORIDE (POUR BTL) OPTIME
TOPICAL | Status: DC | PRN
  Administered 2018-10-16 (×5): 1000 mL

## 2018-10-16 MED ORDER — SODIUM CHLORIDE 0.9% IV SOLUTION: Freq: Once | INTRAVENOUS | Status: DC

## 2018-10-16 MED ORDER — ONDANSETRON HCL 4 MG/2ML IJ SOLN
INTRAMUSCULAR | Status: AC
  Filled 2018-10-16: qty 2

## 2018-10-16 MED ORDER — ARTIFICIAL TEARS OPHTHALMIC OINT
1.0000 "application " | TOPICAL_OINTMENT | Freq: Three times a day (TID) | OPHTHALMIC | Status: DC
  Administered 2018-10-16 – 2018-10-17 (×3): 1 via OPHTHALMIC
  Filled 2018-10-16: qty 3.5

## 2018-10-16 MED ORDER — PROPOFOL 10 MG/ML IV BOLUS
INTRAVENOUS | Status: AC
  Filled 2018-10-16: qty 20

## 2018-10-16 MED ORDER — SODIUM CHLORIDE 0.9 % IV SOLN
80.0000 mg | Freq: Once | INTRAVENOUS | Status: AC
  Administered 2018-10-16: 80 mg via INTRAVENOUS
  Filled 2018-10-16: qty 80

## 2018-10-16 MED ORDER — VASOPRESSIN 20 UNIT/ML IV SOLN
0.0300 [IU]/min | INTRAVENOUS | Status: DC
  Administered 2018-10-16 – 2018-10-18 (×4): 0.03 [IU]/min via INTRAVENOUS
  Filled 2018-10-16 (×7): qty 2

## 2018-10-16 MED ORDER — EPINEPHRINE PF 1 MG/ML IJ SOLN
0.5000 ug/min | INTRAVENOUS | Status: DC
  Administered 2018-10-16: 0.5 ug/min via INTRAVENOUS
  Filled 2018-10-16: qty 4

## 2018-10-16 MED ORDER — FUROSEMIDE 10 MG/ML IJ SOLN
INTRAMUSCULAR | Status: AC
  Filled 2018-10-16: qty 2

## 2018-10-16 MED ORDER — PANTOPRAZOLE SODIUM 40 MG IV SOLR: 40.0000 mg | Freq: Two times a day (BID) | INTRAVENOUS | Status: DC

## 2018-10-16 MED ORDER — ACETAMINOPHEN 10 MG/ML IV SOLN
INTRAVENOUS | Status: DC | PRN
  Administered 2018-10-16: 1000 mg via INTRAVENOUS

## 2018-10-16 MED ORDER — LACTATED RINGERS IV BOLUS
1000.0000 mL | Freq: Once | INTRAVENOUS | Status: AC
  Administered 2018-10-16: 23:00:00 1000 mL via INTRAVENOUS

## 2018-10-16 MED ORDER — FENTANYL CITRATE (PF) 100 MCG/2ML IJ SOLN
100.0000 ug | Freq: Once | INTRAMUSCULAR | Status: AC
  Administered 2018-10-16: 09:00:00 100 ug via INTRAVENOUS

## 2018-10-16 MED ORDER — SODIUM CHLORIDE 0.9 % IV SOLN
0.5000 ug/kg/min | INTRAVENOUS | Status: DC
  Administered 2018-10-16: 3 ug/kg/min via INTRAVENOUS
  Filled 2018-10-16: qty 20

## 2018-10-16 MED ORDER — ALBUTEROL SULFATE (2.5 MG/3ML) 0.083% IN NEBU
2.5000 mg | INHALATION_SOLUTION | Freq: Once | RESPIRATORY_TRACT | Status: AC
  Administered 2018-10-16: 05:00:00 2.5 mg via RESPIRATORY_TRACT

## 2018-10-16 MED ORDER — METOPROLOL TARTRATE 5 MG/5ML IV SOLN: 2.5000 mg | INTRAVENOUS | Status: DC | PRN

## 2018-10-16 MED ORDER — LACTATED RINGERS IV BOLUS
500.0000 mL | Freq: Once | INTRAVENOUS | Status: AC
  Administered 2018-10-17: 500 mL via INTRAVENOUS

## 2018-10-16 MED ORDER — MIDAZOLAM HCL 2 MG/2ML IJ SOLN
INTRAMUSCULAR | Status: AC
  Filled 2018-10-16: qty 2

## 2018-10-16 MED ORDER — ALBUMIN HUMAN 5 % IV SOLN
INTRAVENOUS | Status: DC | PRN
  Administered 2018-10-16: 18:00:00 via INTRAVENOUS

## 2018-10-16 MED ORDER — PHENYLEPHRINE 40 MCG/ML (10ML) SYRINGE FOR IV PUSH (FOR BLOOD PRESSURE SUPPORT)
PREFILLED_SYRINGE | INTRAVENOUS | Status: AC
  Filled 2018-10-16: qty 10

## 2018-10-16 MED ORDER — MIDAZOLAM HCL 2 MG/2ML IJ SOLN
INTRAMUSCULAR | Status: AC
  Administered 2018-10-16: 09:00:00 2 mg via INTRAVENOUS
  Filled 2018-10-16: qty 2

## 2018-10-16 MED ORDER — CISATRACURIUM BESYLATE (PF) 10 MG/5ML IV SOLN
0.1000 mg/kg | INTRAVENOUS | Status: DC | PRN
  Filled 2018-10-16 (×2): qty 4.7

## 2018-10-16 MED ORDER — SODIUM CHLORIDE 0.9 % IV BOLUS
1000.0000 mL | Freq: Once | INTRAVENOUS | Status: AC
  Administered 2018-10-16: 13:00:00 1000 mL via INTRAVENOUS

## 2018-10-16 MED ORDER — ETOMIDATE 2 MG/ML IV SOLN
20.0000 mg | Freq: Once | INTRAVENOUS | Status: AC
  Administered 2018-10-16: 09:00:00 20 mg via INTRAVENOUS

## 2018-10-16 MED ORDER — SODIUM BICARBONATE 8.4 % IV SOLN
INTRAVENOUS | Status: DC | PRN
  Administered 2018-10-16: 50 meq via INTRAVENOUS

## 2018-10-16 MED FILL — Medication: Qty: 1 | Status: AC

## 2018-10-16 SURGICAL SUPPLY — 47 items
BLADE CLIPPER SURG (BLADE) IMPLANT
CANISTER SUCT 3000ML PPV (MISCELLANEOUS) ×4 IMPLANT
CHLORAPREP W/TINT 26ML (MISCELLANEOUS) ×4 IMPLANT
COVER SURGICAL LIGHT HANDLE (MISCELLANEOUS) ×4 IMPLANT
COVER WAND RF STERILE (DRAPES) ×4 IMPLANT
DRAIN CHANNEL 19F RND (DRAIN) ×4 IMPLANT
DRAPE LAPAROSCOPIC ABDOMINAL (DRAPES) ×4 IMPLANT
DRAPE WARM FLUID 44X44 (DRAPES) ×4 IMPLANT
DRSG OPSITE POSTOP 4X10 (GAUZE/BANDAGES/DRESSINGS) IMPLANT
DRSG OPSITE POSTOP 4X8 (GAUZE/BANDAGES/DRESSINGS) IMPLANT
ELECT BLADE 6.5 EXT (BLADE) IMPLANT
ELECT CAUTERY BLADE 6.4 (BLADE) ×4 IMPLANT
ELECT REM PT RETURN 9FT ADLT (ELECTROSURGICAL) ×4
ELECTRODE REM PT RTRN 9FT ADLT (ELECTROSURGICAL) ×2 IMPLANT
EVACUATOR SILICONE 100CC (DRAIN) ×8 IMPLANT
GLOVE BIO SURGEON STRL SZ 6 (GLOVE) ×4 IMPLANT
GLOVE INDICATOR 6.5 STRL GRN (GLOVE) ×4 IMPLANT
GOWN STRL REUS W/ TWL LRG LVL3 (GOWN DISPOSABLE) ×2 IMPLANT
GOWN STRL REUS W/TWL 2XL LVL3 (GOWN DISPOSABLE) ×4 IMPLANT
GOWN STRL REUS W/TWL LRG LVL3 (GOWN DISPOSABLE) ×2
KIT BASIN OR (CUSTOM PROCEDURE TRAY) ×4 IMPLANT
KIT TURNOVER KIT B (KITS) ×4 IMPLANT
LIGASURE IMPACT 36 18CM CVD LR (INSTRUMENTS) ×4 IMPLANT
NS IRRIG 1000ML POUR BTL (IV SOLUTION) ×8 IMPLANT
PACK GENERAL/GYN (CUSTOM PROCEDURE TRAY) ×4 IMPLANT
PAD ARMBOARD 7.5X6 YLW CONV (MISCELLANEOUS) ×4 IMPLANT
PENCIL SMOKE EVACUATOR (MISCELLANEOUS) ×4 IMPLANT
SLEEVE SUCTION CATH 165 (SLEEVE) ×4 IMPLANT
SPECIMEN JAR LARGE (MISCELLANEOUS) IMPLANT
SPONGE ABD ABTHERA ADVANCE (MISCELLANEOUS) ×4 IMPLANT
SPONGE LAP 18X18 RF (DISPOSABLE) IMPLANT
STAPLER VISISTAT 35W (STAPLE) ×4 IMPLANT
SUCTION POOLE TIP (SUCTIONS) ×4 IMPLANT
SUT ETHILON 2 0 FS 18 (SUTURE) ×4 IMPLANT
SUT PDS II 0 TP-1 LOOPED 60 (SUTURE) ×8 IMPLANT
SUT PROLENE 2 0 SH DA (SUTURE) ×8 IMPLANT
SUT SILK 2 0 SH CR/8 (SUTURE) ×4 IMPLANT
SUT SILK 3 0SH CR/8 30 (SUTURE) ×4 IMPLANT
SUT VIC AB 2-0 SH 18 (SUTURE) ×4 IMPLANT
SUT VIC AB 3-0 SH 18 (SUTURE) ×4 IMPLANT
SUT VICRYL 4-0 PS2 18IN ABS (SUTURE) IMPLANT
SUT VICRYL AB 2 0 TIES (SUTURE) ×4 IMPLANT
SUT VICRYL AB 3 0 TIES (SUTURE) ×4 IMPLANT
TOWEL GREEN STERILE FF (TOWEL DISPOSABLE) ×4 IMPLANT
TOWEL OR 17X26 10 PK STRL BLUE (TOWEL DISPOSABLE) ×4 IMPLANT
TRAY FOLEY MTR SLVR 16FR STAT (SET/KITS/TRAYS/PACK) IMPLANT
YANKAUER SUCT BULB TIP NO VENT (SUCTIONS) IMPLANT

## 2018-10-16 NOTE — Progress Notes (Signed)
NT suctioned patient for small amount of thick, tan sputum.  Patient tolerated well, no complication.

## 2018-10-16 NOTE — Progress Notes (Signed)
[facility] called. Upon arrival to pt's room, pt had appropriate and working IV access.

## 2018-10-16 NOTE — Progress Notes (Addendum)
CRITICAL VALUE ALERT  Critical Value:  Lactic Acid 8.9   Date & Time Notied:   10/16/2018 2245  Provider Notified: Warren Lacy RN Kathlee Nations  Orders Received/Actions taken: LR Bolus

## 2018-10-16 NOTE — Progress Notes (Signed)
Patient was transported to CT & back to 4N16 without any complications.

## 2018-10-16 NOTE — Progress Notes (Signed)
[facility] Progress Note Patient Name: Tristan Hughes DOB: 12/30/1940 MRN: [id number]   Date of Service  10/16/2018  HPI/Events of Note  Paged Dr Barry Dienes at [phone] and discussed case with her as well  eICU Interventions  1. NG tube to LIS 2. Supportive care Discussed with bedside RN over camera Asked that I be called back with labs  Patient on 2 pressors, was on 3 pressors earlier     Intervention Category Intermediate Interventions: Communication with other healthcare providers and/or family  [name] 10/16/2018, 9:06 PM

## 2018-10-16 NOTE — Progress Notes (Signed)
I was made aware of the patient's cardiac arrest this morning and went by the patient's room.  He was intubated and sedated.  His wife and daughter were at the bedside.  I offered my support to the patient's family, and informed them that I will continue to follow the patient and monitor his inpatient status.    Thank you for caring for our mutual patient in this challenging situation.  Please do not hesitate to contact me if there is anything I can help with.  [name], MD 10/16/2018 4:22 PM

## 2018-10-16 NOTE — Progress Notes (Signed)
[facility] Progress Note Patient Name: Tristan Hughes DOB: 1941-04-29 MRN: [id number]   Date of Service  10/16/2018  HPI/Events of Note  A fib with RVR   eICU Interventions  RN clarified that patient has been on 3 pressors, not 2 (also on epi) HR in 110 range BP unchanged Getting his LR bolus Will give another 500 cc bolus Chem panel pending As long as rates less than 120, will tolerate and hope that fluids will also improve the A fib  If not, will initiate amiodarone drip  May also need bicarb depending on labs Asked RN to let me know when labs result     Intervention Category Major Interventions: Arrhythmia - evaluation and management;Other:  [name] 10/16/2018, 11:58 PM

## 2018-10-16 NOTE — Progress Notes (Signed)
Pt having increased shortness of breath. Breathing treatment given at 0515 with no relief. Lungs sounded very coarse. Called RRRN at bedside.

## 2018-10-16 NOTE — Progress Notes (Signed)
Saw this pt as charge nurse for 6N, pt in respiratory distress, course rhonchi throughout all lung fields.  Rapid Response nurse at bedside.  Called Will, PAC and notified of pt condition, will transfer.

## 2018-10-16 NOTE — Progress Notes (Signed)
Had recurrent respiratory leading to cardiac arrest.  Increased WOB.  Worsening hypoxia and hypotension.  Increased PEEP, FiO2, respiratory rate.  Started levophed.  Will add versed/fentanyl/nimbex.  CT chest shows bibasilar ASD.  CT abd/pelvis shows flocculent gas collection in LUQ with concern for rupture of stomach and necrosis/abscess.  D/w surgery.  Additional CC time 32 minutes  Chesley Mires, MD [facility] Pulmonary/Critical Care 10/16/2018, 12:09 PM

## 2018-10-16 NOTE — Progress Notes (Signed)
Pharmacy Antibiotic Note  Tristan Hughes is a 78 y.o. male on Vancomycin Day #8 s/p partial gastrectomy 6/2, now s/p cardiac arrest and compete gastrectomy today.  Vancomycin peak and trough within goal range; however, SCr jumped from 0.96 to 1.75 this evening and pt is multiple vasopressors after cardiac arrest.    Plan: Hold further scheduled Vancomycin doses F/U am labs  Height: 5\' 9"  (175.3 cm) Weight: 209 lb 3.5 oz (94.9 kg) IBW/kg (Calculated) : 70.7  Temp (24hrs), Avg:101.2 F (38.4 C), Min:99.2 F (37.3 C), Max:102.8 F (39.3 C)  Recent Labs  Lab 10/11/18 1633 10/11/18 2237 10/12/18 0512 10/14/18 0424 10/16/18 0352 10/16/18 0934 10/16/18 1307 10/16/18 1528 10/16/18 1728 10/16/18 2048 10/16/18 2220  WBC  --   --  14.5* 12.8* 19.6* 17.8*  --  5.9  --  25.0*  --   CREATININE  --   --  0.85 0.94 0.96 1.36*  --  1.75*  --   --   --   LATICACIDVEN  --   --   --   --   --  5.7* 7.7*  --   --  8.9*  --   VANCOTROUGH 7*  --   --   --   --   --   --   --  12*  --   --   VANCOPEAK  --  19*  --   --   --   --   --   --   --   --  31    Estimated Creatinine Clearance: 40.2 mL/min (A) (by C-G formula based on SCr of 1.75 mg/dL (H)).    Allergies  Allergen Reactions  . Naproxen Sodium Swelling    SWELLING REACTION UNSPECIFIED     Antimicrobials this admission: Vanc 6/9 >> Zosyn  6/9 >>  Dose adjustments this admission: 6/12  Change vanc to 2gm q24  Microbiology results: 6/8 UCx >> 6/8 BCx >> ngtd  Phillis Knack, PharmD, BCPS

## 2018-10-16 NOTE — Progress Notes (Signed)
Chaplain responded to Code Blue. [employer] of presence.  Patient's issued resolved; however, family has been contacted and is coming in since this has had occurred twice today. Chaplain will be available to meet family. Rev. Tamsen Snider Pager [phone]

## 2018-10-16 NOTE — Consult Note (Signed)
NAME:  Tristan Hughes, MRN:  [id number], DOB:  1940/09/27, LOS: 28 ADMISSION DATE:  10/01/2018, CONSULTATION DATE:  10/16/18 REFERRING MD:  Dr. Barry Dienes , CHIEF COMPLAINT:  Respiratory Distress   Brief History   78 y/o M admitted 6/2 for planned laparoscopy in setting of known adenocarcinoma of the GE junction.  Tristan Hughes is s/p proximal gastrectomy with esophagogastrostomy, pyloroplasty, jejunostomy tube placement, distal pancreatectomy and splenectomy.  There were concerns for possible leak post operatively.  CT of the ABD 6/12 raised concern for gas/fluid in the LUQ.  Tristan Hughes developed respiratory distress 6/17 and was transferred to ICU.  Post intubation suffered PEA arrest requiring ~ 8 minutes of CPR before ROSC.    History of present illness   78 y/o M admitted 6/2 for planned laparoscopy with proximal gastrectomy with esophagogastrostomy, pyloroplasty, jejunostomy tube placement, distal pancreatectomy and splenectomy.    The patient initially presented in 03/2018 with an approximate 20 lb weight loss.  EGD performed by Dr. Fuller Plan showed a GE junction / gastric cardia ulcerated mass.  PET imaging showed a hypermetabolic mass involving the gastric cardia extending into the distal esophagus with adjacent hypermetabolic lymph nodes. Biopsy was positive for adenocarcinoma favoring gastric primary.  Tristan Hughes has completed 3 cycles of FOLFOX.    Hospital course notable for an UGI series post-op that showed a small leak.  Tristan Hughes was kept NPO and maintained on TF.  On 6/17 am, the patient was noted to have respiratory distress requiring a NRB.  The patient was febrile to 102.8. Tristan Hughes was seen by [facility] and given lasix with 981ml response. After transfer to ICU, BiPAP was briefly initiated with low pressures (5/5) but the patient was subsequently intubated due to increased work of breathing.  Upon intubation, the patient was noted to have bloody secretions pooling in the posterior pharynx.  6/17 0400 Hgb 7.9 which was consistent with  prior trend.    Past Medical History  OSA  CAD HTN HLD GERD Gastric Cancer - identified 03/2018, s/p three cycles of FOLFOX DM II  Depression  Anxiety   Significant Hospital Events   6/02 Admit  6/17 PCCM consulted for resp distress, PEA arrest post intubation  Consults:  PCCM 6/17   Procedures:  ETT 6/17 >>  Significant Diagnostic Tests:  CT Abd w/o 6/12 >> surgical changes of proximal gastrectomy with primary anastomosis, with no evidence of enteric contrast leak/anastomic leak or stomach wall disruption after PO contrast. Surgical changes of distal pancreatectomy & splenectomy with persisting flocculent gas/fluid in the LUQ (DDx infection, with perforation / disruption at the surgical site or stomach not likely given the absence of extraluminal contrast) CT Chest / ABD / Pelvis 6/17 >>   Micro Data:  BCx2 6/8 >> negative  UC 6/8 >> negative   Antimicrobials:  Ancef 6/2 x1 (OR on call) Vanco 6/9 >>  Zosyn 6/9 >>   Interim history/subjective:  RN reported increased work of breathing prior to intubation.  Short trial on low pressure bipap > pt failed with tachypnea/increased work of breathing.    Objective   Blood pressure (!) 150/63, pulse (!) 117, temperature (!) 102 F (38.9 C), temperature source Axillary, resp. rate (!) 30, height 5\' 9"  (1.753 m), weight 94.9 kg, SpO2 95 %.    Vent Mode: BIPAP FiO2 (%):  [60 %] 60 % Set Rate:  [10 bmp] 10 bmp PEEP:  [5 cmH20] 5 cmH20 Pressure Support:  [5 cmH20] 5 cmH20   Intake/Output Summary (Last 24 hours)  at 10/16/2018 0916 Last data filed at 10/16/2018 0815 Gross per 24 hour  Intake 2750.41 ml  Output 2411 ml  Net 339.41 ml   Filed Weights   10/11/18 0500 10/12/18 0400 10/14/18 1957  Weight: 97.3 kg 98.1 kg 94.9 kg    Examination: General:  Ill appearing elderly male lying in bed, pale with increased work of breathing prior to BiPAP HEENT: MM pink/dry, no jvd, pupils 9mm Neuro: Awakens to voice pre-intubation,  oriented / follows commands/appropriate.  Generalized weakness but moves all extremities.   CV: s1s2 rrr, no m/r/g, SR-ST on monitor, right chest wall port a cath  PULM: tachypnea, abdominal accessory muscle use, lungs bilaterally clear GI: soft, non-tender, bsx4 hypoactive, midline staples / surgical wound clean/dry/intact, j-tube Extremities: warm/dry, no significant edema  Skin: no rashes or lesions  Resolved Hospital Problem list      Assessment & Plan:   PEA Arrest -approx 8 minutes CPR before ROSC 6/17 -bloody secretions pooling at posterior pharynx on intubation  P: ICU monitoring  Levophed for MAP >65  1 unit PRBC now  Assess EKG, troponin  Hold lovenox 6/17, consider restart 6/18   Acute Hypoxemic Respiratory Failure  -in setting of arrest, possible aspiration  Hx OSA  -does not use CPAP P: PRVC 8 cc/kg, rate 26 Wean PEEP / FiO2 for sats >90% Assess CXR post intubation and in AM ABG 1 hour post intubation   Adenocarcinoma of GE Junction s/p Laparoscopy  -proximal gastrectomy with esophagogastrostomy, pyloroplasty, jejunostomy tube placement, distal pancreatectomy and splenectomy.   -completed 3 cycles of FOLFOX -concern for LUQ gas/fluid on 6/12 CT ABD P: Hold TF NPO  PPI gtt  CCS aware of patients change in status  Defer post-operative recommendations to surgery  Continue empiric abx  Pan culture  At Risk AKI  -post arrest  P: Trend BMP / urinary output Replace electrolytes as indicated Avoid nephrotoxic agents, ensure adequate renal perfusion  Thrombocytosis  -?reactive vs post chemo P: Follow platelets   HTN HLD CAD s/p CABG P: Hold home agents  ICU monitoring  PRN lopressor for SBP >160  Best practice:  Diet: NPO  Pain/Anxiety/Delirium protocol (if indicated): Precedex, Fentanyl  VAP protocol (if indicated): In place  DVT prophylaxis: SCD's, hold lovenox 6/17 GI prophylaxis: PPI gtt  Glucose control: SSI  Mobility: BR, advance as  tolerated  Code Status: Full Code  Family Communication: update per Dr. Halford Chessman in detail 6/17 on events of am Disposition: ICU   Labs   CBC: Recent Labs  Lab 10/10/18 0428 10/11/18 0227 10/12/18 0512 10/14/18 0424 10/16/18 0352  WBC 17.6* 15.5* 14.5* 12.8* 19.6*  HGB 7.2* 7.1* 7.2* 7.5* 7.9*  HCT 23.1* 22.3* 23.1* 24.4* 26.0*  MCV 93.5 92.1 91.7 92.4 91.2  PLT 444* 457* 584* 731* 794*    Basic Metabolic Panel: Recent Labs  Lab 10/10/18 0428 10/11/18 0227 10/12/18 0512 10/14/18 0424 10/16/18 0352  NA 147* 146* 146* 143 145  K 4.0 3.9 4.0 3.8 3.5  CL 109 109 111 110 111  CO2 28 28 26 25 23   GLUCOSE 292* 240* 214* 244* 172*  BUN 21 19 22 21 15   CREATININE 1.01 0.88 0.85 0.94 0.96  CALCIUM 8.0* 8.0* 7.9* 7.7* 8.1*   GFR: Estimated Creatinine Clearance: 73.3 mL/min (by C-G formula based on SCr of 0.96 mg/dL). Recent Labs  Lab 10/11/18 0227 10/12/18 0512 10/14/18 0424 10/16/18 0352  WBC 15.5* 14.5* 12.8* 19.6*    Liver Function Tests: No results for  input(s): AST, ALT, ALKPHOS, BILITOT, PROT, ALBUMIN in the last 168 hours. No results for input(s): LIPASE, AMYLASE in the last 168 hours. No results for input(s): AMMONIA in the last 168 hours.  ABG    Component Value Date/Time   PHART 7.383 10/16/2018 0810   PCO2ART 46.8 10/16/2018 0810   PO2ART 181 (H) 10/16/2018 0810   HCO3 26.4 10/16/2018 0810   TCO2 27 10/01/2018 1126   O2SAT 99.1 10/16/2018 0810     Coagulation Profile: Recent Labs  Lab 10/11/18 0227  INR 1.2    Cardiac Enzymes: No results for input(s): CKTOTAL, CKMB, CKMBINDEX, TROPONINI in the last 168 hours.  HbA1C: Hemoglobin A1C  Date/Time Value Ref Range Status  05/20/2018 09:04 AM 5.9 (A) 4.0 - 5.6 % Final  11/16/2017 08:56 AM 6.5 (A) 4.0 - 5.6 % Final   Hgb A1c MFr Bld  Date/Time Value Ref Range Status  09/25/2018 11:18 AM 5.8 (H) 4.8 - 5.6 % Final    Comment:    (NOTE) Pre diabetes:          5.7%-6.4% Diabetes:               >6.4% Glycemic control for   <7.0% adults with diabetes   10/22/2015 11:26 AM 6.3 4.6 - 6.5 % Final    Comment:    Glycemic Control Guidelines for People with Diabetes:Non Diabetic:  <6%Goal of Therapy: <7%Additional Action Suggested:  >8%     CBG: Recent Labs  Lab 10/15/18 1603 10/15/18 2028 10/16/18 0016 10/16/18 0442 10/16/18 0727  GLUCAP 227* 201* 130* 191* 183*    Review of Systems:   Unable to complete as patient is altered on mechanical ventilation.   Past Medical History  Tristan Hughes,  has a past medical history of Anxiety, CAD (07/09/2009), Cancer ([place]), Cataract, COLONIC POLYPS, HX OF (07/09/2009), Depression, DIAB W/O COMP TYPE II/UNS NOT STATED UNCNTRL (07/09/2009), Family history of colon cancer, Family history of pancreatic cancer, GERD (gastroesophageal reflux disease), History of chicken pox, HYPERLIPIDEMIA (07/09/2009), HYPERTENSION (07/09/2009), OBSTRUCTIVE SLEEP APNEA (07/09/2009), and Sleep apnea.   Surgical History    Past Surgical History:  Procedure Laterality Date  . BIOPSY  07/22/2018   Procedure: BIOPSY;  Surgeon: Mansouraty, Telford Nab., MD;  Location: [place];  Service: Endoscopy;;  . BIOPSY BREAST Left   . CHOLECYSTECTOMY    . COLONOSCOPY    . CORONARY ANGIOPLASTY WITH STENT PLACEMENT    . CORONARY ARTERY BYPASS GRAFT  8/12   CABG X 4 LIMA-LAD, SVG-PDB, SVG-OM/RAMUS   /  . ESOPHAGOGASTRODUODENOSCOPY N/A 07/22/2018   Procedure: ESOPHAGOGASTRODUODENOSCOPY (EGD);  Surgeon: Rush Landmark Telford Nab., MD;  Location: [place];  Service: Endoscopy;  Laterality: N/A;  . EUS N/A 07/22/2018   Procedure: UPPER ENDOSCOPIC ULTRASOUND (EUS) RADIAL;  Surgeon: Rush Landmark Telford Nab., MD;  Location: [place];  Service: Endoscopy;  Laterality: N/A;  . EUS  07/22/2018   Procedure: UPPER ENDOSCOPIC ULTRASOUND (EUS) LINEAR;  Surgeon: Rush Landmark Telford Nab., MD;  Location: [place];  Service: Endoscopy;;  . EYE SURGERY Bilateral    catarct  . FINE NEEDLE ASPIRATION   07/22/2018   Procedure: FINE NEEDLE ASPIRATION (FNA) LINEAR;  Surgeon: Irving Copas., MD;  Location: [place];  Service: Endoscopy;;  . IR IMAGING GUIDED PORT INSERTION  05/15/2018  . JEJUNOSTOMY N/A 10/01/2018   Procedure: JEJUNOSTOMY TUBE PLACEMENT;  Surgeon: Stark Klein, MD;  Location: [place];  Service: General;  Laterality: N/A;  . LAPAROSCOPY N/A 10/01/2018   Procedure: LAPAROSCOPY DIAGNOSTIC;  Surgeon:  Stark Klein, MD;  Location: [place];  Service: General;  Laterality: N/A;  . OPEN PROXIMAL GASTRECTOMY  10/01/2018   Procedure: OPEN PROXIMAL GASTRECTOMY ;  Surgeon: Stark Klein, MD;  Location: [place];  Service: General;;  . OPERATIVE ULTRASOUND  10/01/2018   Procedure: OPERATIVE ULTRASOUND;  Surgeon: Stark Klein, MD;  Location: [place];  Service: General;;  . PYLOROPLASTY N/A 10/01/2018   Procedure: PYLOROPLASTY ;  Surgeon: Stark Klein, MD;  Location: [place];  Service: General;  Laterality: N/A;  . TONSILLECTOMY       Social History   reports that Tristan Hughes has never smoked. Tristan Hughes has never used smokeless tobacco. Tristan Hughes reports that Tristan Hughes does not drink alcohol or use drugs.   Family History   His family history includes Colon cancer in his maternal uncle; Diabetes in his mother; Heart disease in his father, maternal grandfather, mother, and paternal grandfather; Hypertension in his father and mother; Other in his maternal aunt and paternal aunt; Pancreatic cancer in his mother; Parkinson's disease in his sister; Stroke in his mother. There is no history of Esophageal cancer, Stomach cancer, or Rectal cancer.   Allergies Allergies  Allergen Reactions  . Naproxen Sodium Swelling    SWELLING REACTION UNSPECIFIED      Home Medications  Prior to Admission medications   Medication Sig Start Date End Date Taking? Authorizing Provider  amLODipine (NORVASC) 5 MG tablet TAKE 1 TABLET DAILY (PLEASE SCHEDULE A PHYSICAL FOR MORE REFILLS) Patient taking differently: Take 5 mg by mouth every evening.   05/27/18  Yes Burchette, Alinda Sierras, MD  Ascorbic Acid (VITAMIN C) 1000 MG tablet Take 1,000 mg by mouth every evening.    Yes [provider]  aspirin EC 81 MG tablet Take 81 mg by mouth every evening.   Yes [provider]  b complex vitamins tablet Take 1 tablet by mouth every evening.    Yes [provider]  calcium carbonate (TUMS - DOSED IN MG ELEMENTAL CALCIUM) 500 MG chewable tablet Chew 1-2 tablets by mouth 2 (two) times daily as needed (acid reflux/indigestion.).    Yes [provider]  Cholecalciferol (VITAMIN D-3) 125 MCG (5000 UT) TABS Take 5,000 Units by mouth daily.   Yes [provider]  ezetimibe (ZETIA) 10 MG tablet TAKE 1 TABLET DAILY Patient taking differently: Take 10 mg by mouth daily.  08/26/18  Yes Burchette, Alinda Sierras, MD  furosemide (LASIX) 20 MG tablet TAKE 1 TABLET TWICE A DAY Patient taking differently: Take 20 mg by mouth 2 (two) times daily.  01/01/18  Yes Burchette, Alinda Sierras, MD  [place] 300 MG CAPS Take 300 mg by mouth 2 (two) times daily.   Yes [provider]  insulin lispro (HUMALOG) 100 UNIT/ML injection Inject 0.15-0.18 mLs (15-18 Units total) into the skin 3 (three) times daily before meals. Patient taking differently: Inject 11-23 Units into the skin 3 (three) times daily before meals.  07/23/18  Yes Burchette, Alinda Sierras, MD  LANTUS 100 UNIT/ML injection INJECT 46 UNITS UNDER THE SKIN AT BEDTIME Patient taking differently: Inject 46 Units into the skin at bedtime.  10/22/17  Yes Burchette, Alinda Sierras, MD  lidocaine-prilocaine (EMLA) cream Apply to affected area once Patient taking differently: Apply 1 application topically daily as needed (prior to port being accessed.).  05/22/18  Yes [name], MD  lisinopril (PRINIVIL,ZESTRIL) 20 MG tablet Take 10 mg by mouth daily.   Yes [provider]  metFORMIN (GLUCOPHAGE) 1000 MG tablet TAKE 1  TABLET TWICE A DAY WITH MEALS Patient taking differently: Take 1,000 mg  by mouth 2 (two) times daily with a meal.  01/22/18  Yes Burchette, Alinda Sierras, MD  metoprolol tartrate (LOPRESSOR) 50 MG tablet TAKE ONE AND ONE-HALF TABLETS TWICE A DAY (SCHEDULE A PHYSICAL FOR MORE REFILLS) Patient taking differently: Take 75 mg by mouth 2 (two) times daily.  01/22/18  Yes Burchette, Alinda Sierras, MD  Multiple Vitamin (MULTIVITAMIN WITH MINERALS) TABS tablet Take 1 tablet by mouth every evening.   Yes [provider]  mupirocin ointment (BACTROBAN) 2 % Place 1 application into the nose 2 (two) times daily. Apply to left second toe once daily Patient taking differently: Apply 1 application topically daily. Apply to left second toe 06/19/18  Yes [place], Stephani Police, DPM  Omega-3 Fatty Acids (FISH OIL) 1200 MG CAPS Take 1,200 mg by mouth 2 (two) times daily.    Yes [provider]  omeprazole (PRILOSEC) 40 MG capsule Take 1 capsule (40 mg total) by mouth 2 (two) times daily. 08/23/18 08/23/19 Yes Burchette, Alinda Sierras, MD  rosuvastatin (CRESTOR) 20 MG tablet TAKE 1 TABLET DAILY (PLEASE SCHEDULE A PHYSICAL FOR MORE REFILLS) Patient taking differently: Take 20 mg by mouth every evening.  01/01/18  Yes Burchette, Alinda Sierras, MD  vitamin E (VITAMIN E) 400 UNIT capsule Takes one tablet by mouth daily. Patient taking differently: Take 400 Units by mouth daily. Takes one tablet by mouth daily. 10/25/15  Yes Burchette, Alinda Sierras, MD  Continuous Blood Gluc Receiver (FREESTYLE LIBRE 14 DAY READER) DEVI 1 Device by Does not apply route daily. Use device to scan blood glucose daily. Dx Code E11.9 09/17/18   Burchette, Alinda Sierras, MD  Continuous Blood Gluc Sensor (FREESTYLE LIBRE 14 DAY SENSOR) MISC Inject 1 Device into the skin every 14 (fourteen) days. Dx Code E11.9 09/17/18   Burchette, Alinda Sierras, MD  dexamethasone (DECADRON) 4 MG tablet Take 2 tablets (8 mg total) by mouth daily. Start the day after chemotherapy for 2 days. Take with food. Patient taking differently: Take 8 mg by mouth See admin  instructions. Start the day after chemotherapy for 2 days as needed.. Take with food. 05/22/18   [name], MD  Insulin Syringe-Needle U-100 (B-D INS SYR HALF-UNIT .3CC/31G) 31G X 5/16" 0.3 ML MISC by Does not apply route.    [provider]  Insulin Syringe-Needle U-100 31G X 5/16" 1 ML MISC Use once daily 12/19/16   Burchette, Alinda Sierras, MD  LORazepam (ATIVAN) 0.5 MG tablet Take 1 tablet (0.5 mg total) by mouth every 6 (six) hours as needed (Nausea or vomiting). 05/22/18   [name], MD  prochlorperazine (COMPAZINE) 10 MG tablet Take 1 tablet (10 mg total) by mouth every 6 (six) hours as needed (Nausea or vomiting). 05/22/18   [name], MD  traMADol (ULTRAM) 50 MG tablet Take 50 mg by mouth 3 (three) times daily as needed (pain).  06/08/18   [provider]     Critical care time: 35 minutes      [name], NP-C  Pulmonary & Critical Care Pgr: [phone] or if no answer [phone] 10/16/2018, 9:16 AM

## 2018-10-16 NOTE — Progress Notes (Signed)
Pharmacy Consult for TPN  Indication: dead stomach, ileus, and potential for fistula  Given timing of TPN consult, pharmacy will evaluate and order TPN as appropriate in AM. Will order TPN labs with AM labs.  Antonietta Jewel, PharmD, [place] Clinical Pharmacist  Pager: [phone] Phone: [phone]

## 2018-10-16 NOTE — Code Documentation (Signed)
After intubation patient developed PEA cardiac arrest with bradyarrhythmia.    CPR immediately started.  Given 2 rounds of epinephrine, bicarbonate, and IV fluid bolus.  ROSC after 8 minutes.  Chesley Mires, MD [facility] Pulmonary/Critical Care 10/16/2018, 9:29 AM

## 2018-10-16 NOTE — Progress Notes (Signed)
Received patient with RRRN at bedside. Patient in resp distress on 15L non-rebreather. Awaiting transfer order to ICU. Patient has had 30mg  of Lasix with urine output of 971mL since 0641. Temp 102.8 covered with PRN tylenol. Report given to Jerene Pitch, RN on 4N ICU.

## 2018-10-16 NOTE — Op Note (Signed)
PRE-OPERATIVE DIAGNOSIS: necrotic stomach  POST-OPERATIVE DIAGNOSIS:  Same  PROCEDURE:  Procedure(s): Reopening of recent laparotomy and completion gastrectomy, placement of Abthera wound vac  SURGEON:  Surgeon(s): Stark Klein, MD  ASSISTANT: Serita Grammes, MD  ANESTHESIA:   general  DRAINS: abthera wound vac   LOCAL MEDICATIONS USED:  NONE  SPECIMEN:  Source of Specimen:  stomach  DISPOSITION OF SPECIMEN:  PATHOLOGY  COUNTS:  NO two forceps missing and ACTION TAKEN: X-RAY(S) TAKEN negative for retained instruments  DICTATION: .Dragon Dictation  PLAN OF CARE: back to ICU  PATIENT DISPOSITION:  ICU - intubated and critically ill.  FINDINGS:  Dead stomach with contamination of LUQ  EBL: 50 mL  PROCEDURE:  Patient was identified in the ICU and brought directly to operating room to by the anesthesia team.  General anesthesia was induced.  A Foley catheter was placed.  Previous staples were removed.  The patient's abdomen was then prepped and draped in sterile fashion.  A timeout was performed according to the surgical safety checklist.  When all was correct, we continued.  The previous incision was opened with a #10 blade.  The prior sutures were removed.  The fascia was opened.  In the upper abdomen immediately gastric secretions were seen.  This was suctioned out.  There was foul odor consistent with necrosis.  The remainder of the incision was opened.  There were dense adhesions in the lower portion of the incision.  A defect in the small bowel was created due to the denseness of the adhesions.  This was oversewn with 2-0 Prolene.  The left upper quadrant was evaluated and the dead stomach was dissected away from the diaphragm and the omentum.  There was a dense inflammatory rind in these locations.  The previous drain was located and the NG tube was palpated.  The anastomosis could not be palpated.    The distal stomach was then evaluated.  The very distal stomach did have  blood supply.  This was transected near the pylorus and closed with Prolene.  The necrotic stomach was removed with a combination of blunt dissection and the LigaSure.  This was passed off the table.  I cannot reach high enough in the chest to get a good staple line on the esophagus.  The NG tube was retracted and the neo-proximal stomach pulled away with minimal traction.  The NG tube was left in the esophagus.  The left upper quadrant was irrigated copiously.  The drain that had been exiting the right abdomen but in the left upper quadrant was removed as it was densely adherent to 2 loops of bowel.  Once the irrigant was no longer cloudy, the Abthera wound VAC was placed into the abdomen.  DuraPrep was used on the skin to assist with adherence of the VAC dressing.  The Trac pad was placed and good suction was achieved.  The patient was taken back to the ICU in stable but critically ill condition.  Minimal changes were made to his pressors.  No acute hemodynamic events occurred intraoperatively .

## 2018-10-16 NOTE — Progress Notes (Signed)
15 Days Post-Op    CC: Respiratory distress  Subjective: Called for acute respiratory distress Normal EF Temp 102  138/58, HR 103 Sat 98% on rebreather facemask Lasix 20 given at 6:40 AM, additional given now On zosyn  Pt had a cough yesterday, progressive SOB over night, Rx with albuterol, then O@.  Lasix at 6:40AMl Now with full rebreather mask on sats stable.  Ongoing respiratory distress.  Rapid response called. We have give an additional Lasix 20 IV. ABG's ordered.  Transfer to ICU. CCM consult  Objective: Vital signs in last 24 hours: Temp:  [98.5 F (36.9 C)-102.8 F (39.3 C)] 102.8 F (39.3 C) (06/17 0745) Pulse Rate:  [84-120] 108 (06/17 0745) Resp:  [16-38] 38 (06/17 0745) BP: (130-172)/(55-68) 138/58 (06/17 0745) SpO2:  [91 %-98 %] 96 % (06/17 0745) Last BM Date: 10/15/18 2750 IV N.p.o. 1000 urine 10 drain Hold x1 Creatinine 0.96 BBC 19.6 Hemoglobin 7.9, hematocrit 26 Platelets 794,000. Chest x-ray shows enlarged heart with moderate pulmonary congestion small pleural effusion Intake/Output from previous day: 06/16 0701 - 06/17 0700 In: 2750.4 [I.V.:486; NG/GT:1560.7; IV Piggyback:703.8] Out: 1511 [Urine:1500; Drains:10; Stool:1] Intake/Output this shift: Total I/O In: -  Out: 500 [Urine:500]  General appearance: in respiratory distress, SOB, working to breath, on 100% FM Resp: rales and ronchi Cardio: He is tachycardic he was not on telemetry. GI: Surgical incision has a little bit of redness at the base.  Abdomen is distended some.  He does not have any overt peritonitis.  Drain is a dark serous/granular colored fluid. Extremities: Trace edema good distal pulses.  Lab Results:  Recent Labs    10/14/18 0424 10/16/18 0352  WBC 12.8* 19.6*  HGB 7.5* 7.9*  HCT 24.4* 26.0*  PLT 731* 794*    BMET Recent Labs    10/14/18 0424 10/16/18 0352  NA 143 145  K 3.8 3.5  CL 110 111  CO2 25 23  GLUCOSE 244* 172*  BUN 21 15  CREATININE 0.94 0.96   CALCIUM 7.7* 8.1*   PT/INR No results for input(s): LABPROT, INR in the last 72 hours.  No results for input(s): AST, ALT, ALKPHOS, BILITOT, PROT, ALBUMIN in the last 168 hours.   Lipase  No results found for: LIPASE   Medications: . furosemide      . sodium chloride   Intravenous Once  . bisacodyl  10 mg Rectal Daily  . chlorhexidine  15 mL Mouth Rinse BID  . Chlorhexidine Gluconate Cloth  6 each Topical Daily  . citalopram  10 mg Per Tube Daily  . docusate  100 mg Per Tube Daily  . enoxaparin (LOVENOX) injection  40 mg Subcutaneous Q24H  . feeding supplement (PRO-STAT SUGAR FREE 64)  30 mL Oral BID  . free water  150 mL Per Tube Q6H  . insulin aspart  0-20 Units Subcutaneous Q4H  . insulin glargine  45 Units Subcutaneous Daily  . mouth rinse  15 mL Mouth Rinse q12n4p  . metoprolol tartrate  12.5 mg Per Tube BID  . oxyCODONE  5 mg Per Tube Q8H  . pantoprazole sodium  40 mg Per Tube QHS  . sodium chloride flush  10-40 mL Intracatheter Q12H   . sodium chloride Stopped (10/11/18 1123)  . dextrose 5 % and 0.45 % NaCl with KCl 20 mEq/L 20 mL/hr at 10/16/18 0515  . feeding supplement (OSMOLITE 1.5 CAL) 1,000 mL (10/16/18 0534)  . methocarbamol (ROBAXIN) IV Stopped (10/07/18 2047)  . piperacillin-tazobactam (ZOSYN)  IV 3.375  g (10/16/18 0239)  . 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 Stark Klein POD #15  -UGI 10/08/2018 shows mild posterior leak.  FEN: IV fluids/p.o. ID: Zosyn 6/17 >> vancomycin per pharmacy added this a.m. DVT: Lovenox Follow-up: Dr. Barry Dienes   Plan: Patient is transferred to the ICU with CCM consult.  He is  on 100% nonrebreather mask with good saturations.  Dr. Barry Dienes is on the floor to see him now.  Dr. Barry Dienes has contacted the family and discussed events this a.m., and plans going forward.     LOS: 15 days    Tristan Hughes 10/16/2018 [phone]

## 2018-10-16 NOTE — Procedures (Signed)
Arterial Catheter Insertion Procedure Note ZYIR GASSERT [id number] 1941/04/09  Procedure: Insertion of Arterial Catheter  Indications: Blood pressure monitoring and Frequent blood sampling  Procedure Details Consent: Unable to obtain consent because of patient intubated, on ventilator.. Time Out: Verified patient identification, verified procedure, site/side was marked, verified correct patient position, special equipment/implants available, medications/allergies/relevent history reviewed, required imaging and test results available.  Performed  Maximum sterile technique was used including cap, gloves, gown, hand hygiene, mask and sheet. Skin prep: Chlorhexidine; local anesthetic administered 20 gauge catheter was inserted into left radial artery using the Seldinger technique. ULTRASOUND GUIDANCE USED: NO Evaluation Blood flow good; BP tracing good. Complications: No apparent complications.   Lonn Georgia A Dasean Brow 10/16/2018

## 2018-10-16 NOTE — Progress Notes (Signed)
Critical lab values  10/16/2018 1030 Lactic 5.7 [name] NP made aware 1045.     10/16/2018 1057 Troponin 0.28 [name] NP and Dr. Halford Chessman made aware at bedside during code 1130. 10/16/2018 1347 Lactic 7.7 [name] at bedside made aware

## 2018-10-16 NOTE — Progress Notes (Signed)
Per Dr. Halford Chessman give 1 unit PRBC's emergently pt actively coding.

## 2018-10-16 NOTE — Progress Notes (Signed)
Spoke with CCM and based on patients current condition we agree that it would be ideal for the family to come to the hospital. I called the patient's wife and informed her that her husband had another brief cardiac arrest and we recommend that she come to the hospital. I informed Dr. Barry Dienes of the patient's condition and that I instructed the family to come to the hospital.   Tristan Hughes, [facility] Surgery Pager [phone]

## 2018-10-16 NOTE — Progress Notes (Signed)
[place] Progress Note Patient Name: Tristan Hughes DOB: 01-16-1941 MRN: [id number]   Date of Service  10/16/2018  HPI/Events of Note  Lactate 8.9 CBC noted Metabolic panel pending  eICU Interventions  LR bolus x 1  Repeat LA at 3 am Plz notify when chemistry results obtained     Intervention Category Major Interventions: Sepsis - evaluation and management  [name] 10/16/2018, 11:06 PM

## 2018-10-16 NOTE — Significant Event (Signed)
Rapid Response Event Note  Overview:Called d/t resp distress. Around 0530, pt SpO2 dropped to 84% on RA, RN placed him on 3L [place] with SpO2 increasing to 91%. At that time RN heard wheezing t/o lungs, HR-120s, T-99.9. RN encouraged IS with pt, pt only able to pull 750.  MD Kae Heller notified and ordered PCXR, breathing tx, and EKG Time Called: 0604 Arrival Time: 0607 Event Type: Respiratory  Initial Focused Assessment: Pt laying in bed with increased WOB, congestion heard without stethoscope. Attempted to suction throat with yaunker but unable to get to secretions.  T-99.9, HR-130, BP-140/65, RR-36, SpO2-77% on 3L [place]. Pt placed on NRB with SpO2 increasing to 98%. Pt alert and oriented, unable to speak more than one word at a time. Lungs with rales and rhonchi t/o. Pt nts'd x 2, small amount thick tan secretions suctioned out.  Interventions: Pt placed on continuous pulse-ox. NRB NTS x2(by RN and RT) 20mg  lasix IV x 1 PCXR-1. Slight decrease in left pleural effusion and basilar airspace disease. 2. Cardiomegaly and moderate pulmonary vascular congestion persist. EKG-ST  Plan of Care (if not transferred):  Give lasix, if pt doesn't improve quickly will transfer to ICU.  Event Summary: Name of Physician Notified: Dr. Kae Heller at [phone]    at          [facility]

## 2018-10-16 NOTE — Progress Notes (Signed)
VAST responded to Code Blue. Pt has 2 appropriate and working vascular accesses.

## 2018-10-16 NOTE — Progress Notes (Signed)
[place] Progress Note Patient Name: Tristan Hughes DOB: 02/18/1941 MRN: [id number]   Date of Service  10/16/2018  HPI/Events of Note  Patient back in ICU s/p OR for necrotic stomach. On 2 vasopressors (max levophed and on vasopressin). He is intubated, sedated and currently not on paralytics since he has vent synchrony.   eICU Interventions  Spoke with RN Check stat labs ABG as well as lactate Continue fluids, vent support, pressors and antibiotics     Intervention Category Major Interventions: Shock - evaluation and management;Respiratory failure - evaluation and management  [name] 10/16/2018, 8:53 PM

## 2018-10-16 NOTE — Progress Notes (Signed)
Pt going to OR. CRNA arrived with OR team to take pt. Family at bedside.

## 2018-10-16 NOTE — Transfer of Care (Signed)
Immediate Anesthesia Transfer of Care Note  Patient: Tristan Hughes  Procedure(s) Performed: EXPLORATORY LAPAROTOMY (N/A Abdomen) Application Of Wound Vac (N/A Abdomen)  Patient Location: ICU  Anesthesia Type:General  Level of Consciousness: sedated, unresponsive and Patient remains intubated per anesthesia plan  Airway & Oxygen Therapy: Patient remains intubated per anesthesia plan and Patient placed on Ventilator (see vital sign flow sheet for setting)  Post-op Assessment: Report given to RN and Post -op Vital signs reviewed and stable  Post vital signs: Reviewed and stable  Last Vitals:  Vitals Value Taken Time  BP    Temp    Pulse    Resp    SpO2      Last Pain:  Vitals:   10/16/18 1600  TempSrc: Axillary  PainSc:       Patients Stated Pain Goal: 0 (93/81/82 9937)  Complications: No apparent anesthesia complications

## 2018-10-16 NOTE — Significant Event (Addendum)
Rapid Response Event Note  Overview: Respiratory Distress  Initial Focused Assessment: Met Night RR RN this morning at shift change on 6N. Tristan Hughes is still quite labored in his breathing, RR in the upper 30s- low 40s, 95% on 15L NRB, HR in the 110-115 range. Lung sounds - rhonchi throughout. Was given Lasix 20 mg IV and voided 500 cc since that dose. + SOB/+WOB, + use of accessory muscles. Abdomen intact, patient denies pain, + JP drain, G/J present, incision C/D/I. Skin - very hot to touch, + diaphoresis, flushed - febrile. + pulses - thready but present.  Interventions: -- Lasix 40 mg IV total (UOP 1L) - despite ongoing diuresis, patient is quite labored in breathing, RR still in the mid 87s. -- Temp 102.8 -- RN gave APAP -- STAT ABG -- Dr. Barry Dienes paged at [phone], awaiting page back. PA Questa paged, informed PA of patient's change in condition. PA arrived to bedside  Plan of Care: -- Pending Transfer to [place]. I called the RT as well to update them as well have a BIPAP ready in the ICU as well.  -- I attempted PIV access once in 4N - unable to gain PIV - I requested VAST RN consult. Only current access is RT Chest Port.   Event Summary:  Start Time: 0700  End Time: 0900  Tristan Hughes R

## 2018-10-16 NOTE — Procedures (Signed)
Intubation Procedure Note THORNE WIRZ [id number] [birth year]  Procedure: Intubation Indications: Respiratory insufficiency  Procedure Details Consent: Risks of procedure as well as the alternatives and risks of each were explained to the (patient/caregiver).  Consent for procedure obtained. Time Out: Verified patient identification, verified procedure, site/side was marked, verified correct patient position, special equipment/implants available, medications/allergies/relevent history reviewed, required imaging and test results available.  Performed  Maximum sterile technique was used including gloves, hand hygiene and mask.  MAC and 4  Given 2 mg versed, 100 mcg fentanyl, 20 mg etomidate, 80 mg rocuronium.  Inserted 7.5 ETT to 24 cm at lip.  Confirmed with CO2 detector and ausculation.  Had pooling red secretions from esophagus in posterior pharynx, with same material suctioned from ETT after intubation.  Likely has been aspirating.  Evaluation Hemodynamic Status: BP stable throughout; O2 sats: stable throughout Patient's Current Condition: stable Complications: No apparent complications Patient did tolerate procedure well. Chest X-ray ordered to verify placement.  CXR: pending.   Racquelle Hyser 10/16/2018

## 2018-10-16 NOTE — Code Documentation (Signed)
  Patient Name: Tristan Hughes   MRN: [id number]   Date of Birth/ Sex: 30-Dec-1940 , male      Admission Date: 10/01/2018  Attending Provider: Stark Klein, MD  Primary Diagnosis: Primary adenocarcinoma of cardia of stomach ([place]) [C16.0]   Indication: Pt was stable following achieving ROSC after a CODE blue earlier in the morning. when he was noted to be tachycardic and ultimately lost his pulse. Code blue was subsequently called. At the time of arrival on scene, ACLS protocol was underway.   Technical Description:  - CPR performance duration:  4 minutes  - Was defibrillation or cardioversion used? No   - Was external pacer placed? No  - Was patient intubated pre/post CPR? Yes   Medications Administered: Y = Yes; Blank = No Amiodarone    Atropine    Calcium    Epinephrine  Y  Lidocaine    Magnesium    Norepinephrine    Phenylephrine    Sodium bicarbonate    Vasopressin    Other    Post CPR evaluation:  - Final Status - Was patient successfully resuscitated ? Yes  Miscellaneous Information:     - Primary team notified?  Yes  - Family Notified? No     Matilde Haymaker, MD   10/16/2018, 11:48 AM

## 2018-10-16 NOTE — Progress Notes (Signed)
Called to repeat code - RN reports pt desaturated but was alert not biting on ETT.  Subsequent PEA arrest requiring ACLS with 1 round EPI before ROSC.  Pt remains on mechanical ventilation.  Reached out to Radiology regarding CT read which is pending.  New findings of bilateral basilar infiltrates.  CCS notified.  CCS to notify family and will work on clearance for visitation.    [name], NP-C [facility] Pgr: [phone] or if no answer [phone] 10/16/2018, 11:57 AM

## 2018-10-16 NOTE — Progress Notes (Signed)
5258 - ECG rhythm PEA, hypotensive (SBP 38/24). Dr. Halford Chessman at bedside. Started CPR, gave 1 epi. 1 unit emergent blood ordered.  0920 - 1 amp bicarb given  0921 - Stopped CPR for pulse check - no pulse. Resumed CPR  [phone] - 1 epi given  0923 - Stopped CPR for pulse check - pt has pulse.

## 2018-10-16 NOTE — Progress Notes (Signed)
This chaplain responded to Pt. Code Blue.  The chaplain was pastorally present for Pt. and healthcare team. Per communication with Elmo Putt, the chaplain understands spiritual care is not needed at this time.  This chaplain is available for F/U spiritual care as needed.

## 2018-10-16 NOTE — Progress Notes (Signed)
EKG CRITICAL VALUE     12 lead EKG performed.  Critical value noted.  Sharyn Dross, RN notified.   Radene Gunning, CCT 10/16/2018 12:41 PM

## 2018-10-16 NOTE — Procedures (Signed)
Central Venous Catheter Insertion Procedure Note Tristan Hughes [id number] 1941/01/23  Procedure: Insertion of Central Venous Catheter Indications: Assessment of intravascular volume, Drug and/or fluid administration and Frequent blood sampling  Procedure Details Consent: Risks of procedure as well as the alternatives and risks of each were explained to the (patient/caregiver).  Consent for procedure obtained. Time Out: Verified patient identification, verified procedure, site/side was marked, verified correct patient position, special equipment/implants available, medications/allergies/relevent history reviewed, required imaging and test results available.  Performed  Maximum sterile technique was used including antiseptics, cap, gloves, gown, hand hygiene, mask and sheet. Skin prep: Chlorhexidine; local anesthetic administered A antimicrobial bonded/coated triple lumen catheter was placed in the left internal jugular vein to 18 cm using the Seldinger technique.  Biopatch applied, sutured in place.    Evaluation Blood flow good Complications: No apparent complications Patient did tolerate procedure well. Chest X-ray ordered to verify placement.  CXR: pending.   Procedure performed under direct supervision of Dr. Halford Chessman and with ultrasound guidance for real time vessel cannulation.      [name], NP-C [place] Pgr: [phone] or if no answer [phone] 10/16/2018, 2:32 PM

## 2018-10-16 NOTE — Progress Notes (Signed)
Family at bedside, updated on patients status.  Central venous access explained - family would like to wait until son can visit before proceeding.    RN reports levophed at ceiling dose.  Pt continues to have soft / normal pressures.  Vasopressin gtt added for hypotension.  NMB gtt added as well.   Tristan Gens, NP-C [place] [facility] Pgr: [phone] or if no answer [phone] 10/16/2018, 1:38 PM

## 2018-10-16 NOTE — Progress Notes (Signed)
Arrived while patient was actively receiving CPR.  ROSC achieved after 8 minutes.  Some bloody fluid was encountered upon intubation in the mouth and airway.  J-tube placed to gravity bag to see if bleeding could be noted further down.  So far no output has been seen.  JP drain still with stable leak output.  No blood in this either.  OGT has been placed but does not appear to be working.  Awaiting CXR and ABX as well as CT scans of C/A/P.  Fluid from mouth noted to be bloody cloudy, but not frankly bloody c/w acute GI bleed.  Will continue to closely monitor for further surgical needs.  D/W Dr. Barry Dienes over the phone.  Tristan Hughes 9:50 AM 10/16/2018

## 2018-10-16 NOTE — Code Documentation (Addendum)
CODE BLUE   I was present in the room, patient was intubated successfully, become hypotensive, went into PEA at 0918, I initiated chest compressions and Code Blue was called. See CODE BLUE sheet for details. Obtained pulses at 0923.

## 2018-10-16 NOTE — Progress Notes (Signed)
Patient with worsening tachypnea and hypoxia early this morning. Minimal improvement with nebs. RR in the low to mid 20s, sats low 90s on NRB, lots of upper respiratory coarse wheezing, CXR looks like pulmonary congestion. RT at bedside to try NT suctioning and will try a dose of lasix. If no improvement will need to be transferred to ICU.

## 2018-10-16 NOTE — Progress Notes (Signed)
Pt satting 84% on RA this am, put on 3 L on Tristan Hughes O2 went up to 91%. Wheezing auscultated throughout the lungs. Encourage IS, pt only pulling 761ml. HR 120's, oral temp is 99.9. Made Dr. Kae Heller aware. Ordered stat CXR, breathing trt, and an EKG. Will continue to monitor pt.

## 2018-10-16 NOTE — TOC Progression Note (Signed)
Transition of Care [facility]) - Progression Note    Patient Details  Name: Tristan Hughes MRN: [id number] Date of Birth: 10/16/1940  Transition of [facility]) CM/SW Contact  Jacalyn Lefevre Edson Snowball, RN Phone Number: 10/16/2018, 10:21 AM  Clinical Narrative:     Previous events noted.  [facility]. Alvis Lemmings will follow .   Expected Discharge Plan: [place] Barriers to Discharge: Continued Medical Work up  Expected Discharge Plan and Services Expected Discharge Plan: [place]   Discharge Planning Services: CM Consult Post Acute Care Choice: Home Health, Durable Medical Equipment Living arrangements for the past 2 months: Single Family Home                 DME Arranged: Tube feeding, Tube feeding pump DME Agency: [employer]       HH Arranged: RN, PT, Nurse's Aide [place] Agency: [place] ([facility]) Date HH Agency Contacted: 10/15/18 Time [place]: [place] Representative spoke with at [place]: [place] ([place]) Interventions    Readmission Risk Interventions No flowsheet data found.

## 2018-10-16 NOTE — Progress Notes (Signed)
Chaplain returned to make offer to family for support.  Pt's wife and daughter bedside and son was said to be coming soon.  Family was pleasant with chaplain but did not seem to desire her presence.  They said they wanted to talk to doctor but that doctor was still in surgery.  They thanked chaplain for coming.  Will continue to be available. Tamsen Snider Pager [phone]

## 2018-10-16 NOTE — Progress Notes (Signed)
MAP 58-59 consistently. Need to start nimbex.  [name] NP made aware. New orders received. Will continue to monitor.

## 2018-10-16 NOTE — Anesthesia Preprocedure Evaluation (Signed)
Anesthesia Evaluation  Patient identified by MRN, date of birth, ID band Patient unresponsive    Reviewed: Unable to perform ROS - Chart review onlyPreop documentation limited or incomplete due to emergent nature of procedure.  Airway Mallampati: Intubated       Dental   Pulmonary sleep apnea ,    Pulmonary exam normal        Cardiovascular hypertension, Pt. on medications + CAD  Normal cardiovascular exam     Neuro/Psych Anxiety Depression    GI/Hepatic GERD  Medicated and Controlled,  Endo/Other  diabetes  Renal/GU      Musculoskeletal   Abdominal   Peds  Hematology   Anesthesia Other Findings   Reproductive/Obstetrics                             Anesthesia Physical Anesthesia Plan  ASA: IV and emergent  Anesthesia Plan: General   Post-op Pain Management:    Induction: Intravenous  PONV Risk Score and Plan: 2 and Treatment may vary due to age or medical condition  Airway Management Planned: Oral ETT  Additional Equipment:   Intra-op Plan:   Post-operative Plan: Post-operative intubation/ventilation  Informed Consent: I have reviewed the patients History and Physical, chart, labs and discussed the procedure including the risks, benefits and alternatives for the proposed anesthesia with the patient or authorized representative who has indicated his/her understanding and acceptance.       Plan Discussed with: Surgeon  Anesthesia Plan Comments:         Anesthesia Quick Evaluation

## 2018-10-17 ENCOUNTER — Encounter (HOSPITAL_COMMUNITY): Payer: Self-pay | Admitting: General Surgery

## 2018-10-17 ENCOUNTER — Inpatient Hospital Stay (HOSPITAL_COMMUNITY): Payer: Medicare Other | Admitting: Certified Registered Nurse Anesthetist

## 2018-10-17 ENCOUNTER — Encounter (HOSPITAL_COMMUNITY)
Admission: AD | Disposition: A | Discharge status: Hospice | Payer: Self-pay | Source: Home / Self Care | Attending: General Surgery

## 2018-10-17 ENCOUNTER — Inpatient Hospital Stay (HOSPITAL_COMMUNITY): Payer: Medicare Other

## 2018-10-17 DIAGNOSIS — R6521 Severe sepsis with septic shock: Secondary | ICD-10-CM

## 2018-10-17 DIAGNOSIS — A419 Sepsis, unspecified organism: Secondary | ICD-10-CM

## 2018-10-17 HISTORY — PX: LAPAROTOMY: SHX154

## 2018-10-17 LAB — GLUCOSE, CAPILLARY
Glucose-Capillary: 145 mg/dL — ABNORMAL HIGH (ref 70–99)
Glucose-Capillary: 193 mg/dL — ABNORMAL HIGH (ref 70–99)
Glucose-Capillary: 217 mg/dL — ABNORMAL HIGH (ref 70–99)
Glucose-Capillary: 236 mg/dL — ABNORMAL HIGH (ref 70–99)
Glucose-Capillary: 264 mg/dL — ABNORMAL HIGH (ref 70–99)
Glucose-Capillary: 355 mg/dL — ABNORMAL HIGH (ref 70–99)

## 2018-10-17 LAB — BLOOD GAS, ARTERIAL
Acid-base deficit: 11.6 mmol/L — ABNORMAL HIGH (ref 0.0–2.0)
Acid-base deficit: 5.9 mmol/L — ABNORMAL HIGH (ref 0.0–2.0)
Bicarbonate: 14.3 mmol/L — ABNORMAL LOW (ref 20.0–28.0)
Bicarbonate: 18.5 mmol/L — ABNORMAL LOW (ref 20.0–28.0)
Drawn by: 252031
Drawn by: 560031
FIO2: 0.8
FIO2: 70
MECHVT: 560 mL
MECHVT: 560 mL
O2 Saturation: 95.5 %
O2 Saturation: 93.7 %
PEEP: 12 cmH2O
PEEP: 12 cmH2O
Patient temperature: 98.2
Patient temperature: 98.6
RATE: 34 resp/min
RATE: 34 {breaths}/min
pCO2 arterial: 34.4 mmHg (ref 32.0–48.0)
pCO2 arterial: 33.2 mmHg (ref 32.0–48.0)
pH, Arterial: 7.242 — ABNORMAL LOW (ref 7.350–7.450)
pH, Arterial: 7.364 (ref 7.350–7.450)
pO2, Arterial: 94.7 mmHg (ref 83.0–108.0)
pO2, Arterial: 71 mmHg — ABNORMAL LOW (ref 83.0–108.0)

## 2018-10-17 LAB — COMPREHENSIVE METABOLIC PANEL
ALT: 33 U/L (ref 0–44)
ALT: 36 U/L (ref 0–44)
AST: 54 U/L — ABNORMAL HIGH (ref 15–41)
AST: 54 U/L — ABNORMAL HIGH (ref 15–41)
Albumin: 1.8 g/dL — ABNORMAL LOW (ref 3.5–5.0)
Albumin: 1.9 g/dL — ABNORMAL LOW (ref 3.5–5.0)
Alkaline Phosphatase: 60 U/L (ref 38–126)
Alkaline Phosphatase: 65 U/L (ref 38–126)
Anion gap: 16 — ABNORMAL HIGH (ref 5–15)
Anion gap: 19 — ABNORMAL HIGH (ref 5–15)
BUN: 24 mg/dL — ABNORMAL HIGH (ref 8–23)
BUN: 26 mg/dL — ABNORMAL HIGH (ref 8–23)
CO2: 14 mmol/L — ABNORMAL LOW (ref 22–32)
CO2: 16 mmol/L — ABNORMAL LOW (ref 22–32)
Calcium: 7 mg/dL — ABNORMAL LOW (ref 8.9–10.3)
Calcium: 7.1 mg/dL — ABNORMAL LOW (ref 8.9–10.3)
Chloride: 112 mmol/L — ABNORMAL HIGH (ref 98–111)
Chloride: 113 mmol/L — ABNORMAL HIGH (ref 98–111)
Creatinine, Ser: 1.84 mg/dL — ABNORMAL HIGH (ref 0.61–1.24)
Creatinine, Ser: 1.86 mg/dL — ABNORMAL HIGH (ref 0.61–1.24)
GFR calc Af Amer: 40 mL/min — ABNORMAL LOW (ref >60)
GFR calc Af Amer: 40 mL/min — ABNORMAL LOW (ref >60)
GFR calc non Af Amer: 34 mL/min — ABNORMAL LOW (ref >60)
GFR calc non Af Amer: 35 mL/min — ABNORMAL LOW (ref >60)
Glucose, Bld: 120 mg/dL — ABNORMAL HIGH (ref 70–99)
Glucose, Bld: 193 mg/dL — ABNORMAL HIGH (ref 70–99)
Potassium: 4.3 mmol/L (ref 3.5–5.1)
Potassium: 4.5 mmol/L (ref 3.5–5.1)
Sodium: 145 mmol/L (ref 135–145)
Sodium: 145 mmol/L (ref 135–145)
Total Bilirubin: 1 mg/dL (ref 0.3–1.2)
Total Bilirubin: 1 mg/dL (ref 0.3–1.2)
Total Protein: 4.7 g/dL — ABNORMAL LOW (ref 6.5–8.1)
Total Protein: 4.9 g/dL — ABNORMAL LOW (ref 6.5–8.1)

## 2018-10-17 LAB — BASIC METABOLIC PANEL
Anion gap: 15 (ref 5–15)
BUN: 30 mg/dL — ABNORMAL HIGH (ref 8–23)
CO2: 20 mmol/L — ABNORMAL LOW (ref 22–32)
Calcium: 7.1 mg/dL — ABNORMAL LOW (ref 8.9–10.3)
Chloride: 110 mmol/L (ref 98–111)
Creatinine, Ser: 1.6 mg/dL — ABNORMAL HIGH (ref 0.61–1.24)
GFR calc Af Amer: 47 mL/min — ABNORMAL LOW (ref >60)
GFR calc non Af Amer: 41 mL/min — ABNORMAL LOW (ref >60)
Glucose, Bld: 277 mg/dL — ABNORMAL HIGH (ref 70–99)
Potassium: 4.1 mmol/L (ref 3.5–5.1)
Sodium: 145 mmol/L (ref 135–145)

## 2018-10-17 LAB — POCT I-STAT 7, (LYTES, BLD GAS, ICA,H+H)
Acid-base deficit: 3 mmol/L — ABNORMAL HIGH (ref 0.0–2.0)
Bicarbonate: 23 mmol/L (ref 20.0–28.0)
Calcium, Ion: 1 mmol/L — ABNORMAL LOW (ref 1.15–1.40)
HCT: 28 % — ABNORMAL LOW (ref 39.0–52.0)
Hemoglobin: 9.5 g/dL — ABNORMAL LOW (ref 13.0–17.0)
O2 Saturation: 90 %
Patient temperature: 39.5
Potassium: 4.1 mmol/L (ref 3.5–5.1)
Sodium: 149 mmol/L — ABNORMAL HIGH (ref 135–145)
TCO2: 24 mmol/L (ref 22–32)
pCO2 arterial: 51.1 mmHg — ABNORMAL HIGH (ref 32.0–48.0)
pH, Arterial: 7.274 — ABNORMAL LOW (ref 7.350–7.450)
pO2, Arterial: 76 mmHg — ABNORMAL LOW (ref 83.0–108.0)

## 2018-10-17 LAB — CBC
HCT: 28 % — ABNORMAL LOW (ref 39.0–52.0)
Hemoglobin: 8.3 g/dL — ABNORMAL LOW (ref 13.0–17.0)
MCH: 28.2 pg (ref 26.0–34.0)
MCHC: 29.6 g/dL — ABNORMAL LOW (ref 30.0–36.0)
MCV: 95.2 fL (ref 80.0–100.0)
Platelets: 540 10*3/uL — ABNORMAL HIGH (ref 150–400)
RBC: 2.94 MIL/uL — ABNORMAL LOW (ref 4.22–5.81)
RDW: 16 % — ABNORMAL HIGH (ref 11.5–15.5)
WBC: 35.4 10*3/uL — ABNORMAL HIGH (ref 4.0–10.5)
nRBC: 0.6 % — ABNORMAL HIGH (ref 0.0–0.2)

## 2018-10-17 LAB — TRIGLYCERIDES: Triglycerides: 60 mg/dL (ref <150)

## 2018-10-17 LAB — DIFFERENTIAL
Abs Immature Granulocytes: 1.21 10*3/uL — ABNORMAL HIGH (ref 0.00–0.07)
Basophils Absolute: 0.2 10*3/uL — ABNORMAL HIGH (ref 0.0–0.1)
Basophils Relative: 0 %
Eosinophils Absolute: 0 10*3/uL (ref 0.0–0.5)
Eosinophils Relative: 0 %
Immature Granulocytes: 3 %
Lymphocytes Relative: 2 %
Lymphs Abs: 0.6 10*3/uL — ABNORMAL LOW (ref 0.7–4.0)
Monocytes Absolute: 0.4 10*3/uL (ref 0.1–1.0)
Monocytes Relative: 1 %
Neutro Abs: 32.9 10*3/uL — ABNORMAL HIGH (ref 1.7–7.7)
Neutrophils Relative %: 94 %

## 2018-10-17 LAB — PREALBUMIN
Prealbumin: 5.3 mg/dL — ABNORMAL LOW (ref 18–38)
Prealbumin: 6.2 mg/dL — ABNORMAL LOW (ref 18–38)

## 2018-10-17 LAB — LACTIC ACID, PLASMA
Lactic Acid, Venous: >11 mmol/L (ref 0.5–1.9)
Lactic Acid, Venous: 10.4 mmol/L (ref 0.5–1.9)
Lactic Acid, Venous: 8.4 mmol/L (ref 0.5–1.9)

## 2018-10-17 LAB — PHOSPHORUS: Phosphorus: 5.7 mg/dL — ABNORMAL HIGH (ref 2.5–4.6)

## 2018-10-17 LAB — BLOOD PRODUCT ORDER (VERBAL) VERIFICATION

## 2018-10-17 LAB — PATHOLOGIST SMEAR REVIEW: Path Review: 6152020

## 2018-10-17 LAB — MAGNESIUM: Magnesium: 2 mg/dL (ref 1.7–2.4)

## 2018-10-17 SURGERY — LAPAROTOMY, EXPLORATORY
Anesthesia: General | Site: Abdomen

## 2018-10-17 MED ORDER — SODIUM CHLORIDE 0.9 % IV SOLN
200.0000 mg | Freq: Once | INTRAVENOUS | Status: AC
  Administered 2018-10-17: 03:00:00 200 mg via INTRAVENOUS
  Filled 2018-10-17: qty 200

## 2018-10-17 MED ORDER — ACETAMINOPHEN 650 MG RE SUPP
650.0000 mg | RECTAL | Status: DC | PRN
  Administered 2018-10-19 – 2018-10-23 (×6): 650 mg via RECTAL
  Filled 2018-10-17 (×6): qty 1

## 2018-10-17 MED ORDER — SODIUM CHLORIDE 0.9 % IV SOLN
100.0000 mg | INTRAVENOUS | Status: DC
  Administered 2018-10-18 – 2018-10-26 (×9): 100 mg via INTRAVENOUS
  Filled 2018-10-17 (×10): qty 100

## 2018-10-17 MED ORDER — ROCURONIUM BROMIDE 10 MG/ML (PF) SYRINGE
PREFILLED_SYRINGE | INTRAVENOUS | Status: DC | PRN
  Administered 2018-10-17: 100 mg via INTRAVENOUS
  Administered 2018-10-17: 50 mg via INTRAVENOUS

## 2018-10-17 MED ORDER — PANTOPRAZOLE SODIUM 40 MG IV SOLR: 40.0000 mg | Freq: Two times a day (BID) | INTRAVENOUS | Status: DC

## 2018-10-17 MED ORDER — SODIUM BICARBONATE 8.4 % IV SOLN
50.0000 meq | Freq: Once | INTRAVENOUS | Status: AC
  Administered 2018-10-17: 02:00:00 50 meq via INTRAVENOUS
  Filled 2018-10-17: qty 50

## 2018-10-17 MED ORDER — SODIUM CHLORIDE 0.9 % IV SOLN
INTRAVENOUS | Status: DC | PRN
  Administered 2018-10-17: 15:00:00 via INTRAVENOUS

## 2018-10-17 MED ORDER — TRAVASOL 10 % IV SOLN
INTRAVENOUS | Status: AC
  Administered 2018-10-17: 17:00:00 via INTRAVENOUS
  Filled 2018-10-17: qty 528

## 2018-10-17 MED ORDER — SODIUM BICARBONATE 8.4 % IV SOLN
INTRAVENOUS | Status: DC
  Administered 2018-10-17 – 2018-10-18 (×4): via INTRAVENOUS
  Filled 2018-10-17 (×6): qty 150

## 2018-10-17 MED ORDER — SODIUM BICARBONATE 8.4 % IV SOLN
50.0000 meq | Freq: Once | INTRAVENOUS | Status: AC
  Administered 2018-10-17: 50 meq via INTRAVENOUS

## 2018-10-17 MED ORDER — 0.9 % SODIUM CHLORIDE (POUR BTL) OPTIME
TOPICAL | Status: DC | PRN
  Administered 2018-10-17 (×3): 3000 mL

## 2018-10-17 MED ORDER — LACTATED RINGERS IV BOLUS
500.0000 mL | Freq: Once | INTRAVENOUS | Status: AC
  Administered 2018-10-17: 500 mL via INTRAVENOUS

## 2018-10-17 MED ORDER — PANTOPRAZOLE SODIUM 40 MG IV SOLR
40.0000 mg | Freq: Two times a day (BID) | INTRAVENOUS | Status: DC
  Administered 2018-10-19 – 2018-10-25 (×14): 40 mg via INTRAVENOUS
  Filled 2018-10-17 (×14): qty 40

## 2018-10-17 MED FILL — Medication: Qty: 1 | Status: AC

## 2018-10-17 SURGICAL SUPPLY — 55 items
BAG BILE T-TUBES STRL (MISCELLANEOUS) ×6 IMPLANT
BIOPATCH RED 1 DISK 7.0 (GAUZE/BANDAGES/DRESSINGS) ×2 IMPLANT
BIOPATCH RED 1IN DISK 7.0MM (GAUZE/BANDAGES/DRESSINGS) ×1
BLADE CLIPPER SURG (BLADE) IMPLANT
CANISTER SUCT 3000ML PPV (MISCELLANEOUS) IMPLANT
CANISTER WOUNDNEG PRESSURE 500 (CANNISTER) ×3 IMPLANT
CHLORAPREP W/TINT 26ML (MISCELLANEOUS) IMPLANT
COVER SURGICAL LIGHT HANDLE (MISCELLANEOUS) ×3 IMPLANT
COVER WAND RF STERILE (DRAPES) ×3 IMPLANT
DRAIN CHANNEL 19F RND (DRAIN) ×12 IMPLANT
DRAPE LAPAROSCOPIC ABDOMINAL (DRAPES) ×3 IMPLANT
DRAPE WARM FLUID 44X44 (DRAPES) ×3 IMPLANT
DRSG OPSITE POSTOP 4X10 (GAUZE/BANDAGES/DRESSINGS) IMPLANT
DRSG OPSITE POSTOP 4X8 (GAUZE/BANDAGES/DRESSINGS) IMPLANT
DRSG VAC ATS LRG SENSATRAC (GAUZE/BANDAGES/DRESSINGS) ×3 IMPLANT
ELECT BLADE 6.5 EXT (BLADE) ×3 IMPLANT
ELECT CAUTERY BLADE 6.4 (BLADE) ×3 IMPLANT
ELECT REM PT RETURN 9FT ADLT (ELECTROSURGICAL) ×3
ELECTRODE REM PT RTRN 9FT ADLT (ELECTROSURGICAL) ×1 IMPLANT
EVACUATOR SILICONE 100CC (DRAIN) ×6 IMPLANT
GAUZE SPONGE 4X4 12PLY STRL (GAUZE/BANDAGES/DRESSINGS) ×3 IMPLANT
GAUZE SPONGE 4X4 12PLY STRL LF (GAUZE/BANDAGES/DRESSINGS) ×3 IMPLANT
GLOVE BIO SURGEON STRL SZ 6 (GLOVE) ×3 IMPLANT
GLOVE BIOGEL PI IND STRL 7.5 (GLOVE) ×3 IMPLANT
GLOVE BIOGEL PI INDICATOR 7.5 (GLOVE) ×6
GLOVE INDICATOR 6.5 STRL GRN (GLOVE) ×3 IMPLANT
GLOVE SKINSENSE NS SZ7.0 (GLOVE) ×2
GLOVE SKINSENSE STRL SZ7.0 (GLOVE) ×1 IMPLANT
GLOVE SURG SIGNA 7.5 PF LTX (GLOVE) ×3 IMPLANT
GOWN STRL REUS W/ TWL LRG LVL3 (GOWN DISPOSABLE) ×3 IMPLANT
GOWN STRL REUS W/TWL 2XL LVL3 (GOWN DISPOSABLE) ×3 IMPLANT
GOWN STRL REUS W/TWL LRG LVL3 (GOWN DISPOSABLE) ×6
KIT BASIN OR (CUSTOM PROCEDURE TRAY) ×3 IMPLANT
KIT TURNOVER KIT B (KITS) ×3 IMPLANT
LIGASURE IMPACT 36 18CM CVD LR (INSTRUMENTS) IMPLANT
NS IRRIG 1000ML POUR BTL (IV SOLUTION) ×6 IMPLANT
PACK GENERAL/GYN (CUSTOM PROCEDURE TRAY) ×3 IMPLANT
PAD ARMBOARD 7.5X6 YLW CONV (MISCELLANEOUS) ×6 IMPLANT
PENCIL SMOKE EVACUATOR (MISCELLANEOUS) ×3 IMPLANT
SPECIMEN JAR LARGE (MISCELLANEOUS) IMPLANT
SPONGE LAP 18X18 RF (DISPOSABLE) IMPLANT
STAPLER VISISTAT 35W (STAPLE) IMPLANT
SUCTION POOLE TIP (SUCTIONS) ×3 IMPLANT
SUT ETHILON 2 0 FS 18 (SUTURE) ×12 IMPLANT
SUT PDS II 0 TP-1 LOOPED 60 (SUTURE) ×6 IMPLANT
SUT VIC AB 2-0 SH 18 (SUTURE) ×3 IMPLANT
SUT VIC AB 3-0 SH 18 (SUTURE) ×3 IMPLANT
SUT VICRYL 4-0 PS2 18IN ABS (SUTURE) IMPLANT
SUT VICRYL AB 2 0 TIES (SUTURE) ×3 IMPLANT
SUT VICRYL AB 3 0 TIES (SUTURE) ×3 IMPLANT
TAPE CLOTH SURG 4X10 WHT LF (GAUZE/BANDAGES/DRESSINGS) ×3 IMPLANT
TOWEL GREEN STERILE FF (TOWEL DISPOSABLE) ×3 IMPLANT
TOWEL OR 17X26 10 PK STRL BLUE (TOWEL DISPOSABLE) ×3 IMPLANT
TRAY FOLEY MTR SLVR 16FR STAT (SET/KITS/TRAYS/PACK) IMPLANT
YANKAUER SUCT BULB TIP NO VENT (SUCTIONS) IMPLANT

## 2018-10-17 NOTE — Addendum Note (Signed)
Addendum  created 10/17/18 1839 by Lillia Abed, MD   Attestation recorded in [place], [place] filed, Dance movement psychotherapist edited

## 2018-10-17 NOTE — Anesthesia Procedure Notes (Signed)
Performed by: Leonor Liv, CRNA Oxygen Delivery Method: Circle system utilized Preoxygenation: Pre-oxygenation with 100% oxygen Induction Type: Inhalational induction with existing ETT Placement Confirmation: positive ETCO2 and breath sounds checked- equal and bilateral Dental Injury: Teeth and Oropharynx as per pre-operative assessment

## 2018-10-17 NOTE — Progress Notes (Signed)
Pt was transported from OR to 4N16. Increased 02 to 100% due to low saturations. Will titrate down as needed.

## 2018-10-17 NOTE — Progress Notes (Addendum)
CRITICAL VALUE ALERT  Critical Value:  Lactic Acid >11   Date & Time Notied:  10/17/2018 0423   Provider Notified: Warren Lacy RN Kathlee Nations   Orders Received/Actions taken: Bicarb push with 500cc LR bolus

## 2018-10-17 NOTE — Progress Notes (Signed)
NAME:  Tristan Hughes, MRN:  [id number], DOB:  02-16-1941, LOS: 82 ADMISSION DATE:  10/01/2018, CONSULTATION DATE:  10/16/18 REFERRING MD:  Dr. Barry Dienes , CHIEF COMPLAINT:  Respiratory Distress   Brief History   78 y/o M admitted 6/2 for planned laparoscopy in setting of known adenocarcinoma of the GE junction.  He is s/p proximal gastrectomy with esophagogastrostomy, pyloroplasty, jejunostomy tube placement, distal pancreatectomy and splenectomy.  There were concerns for possible leak post operatively.  CT of the ABD 6/12 raised concern for gas/fluid in the LUQ.  He developed respiratory distress 6/17 and was transferred to ICU.  Post intubation suffered PEA arrest requiring ~ 8 minutes of CPR before ROSC. Repeat ABD imaging 6/17 with concerns for stomach necrosis. He returned to the OR for completion gastrectomy.   Past Medical History  OSA  CAD HTN HLD GERD Gastric Cancer - identified 03/2018, s/p three cycles of FOLFOX DM II  Depression  Anxiety   Significant Hospital Events   6/02 Admit  6/17 PCCM consulted for resp distress, PEA arrest post intubation  Consults:  PCCM 6/17   Procedures:  ETT 6/17 >> L IJ TLC 6/17 >>  L Radial Aline 6/17 >>   Significant Diagnostic Tests:  CT Abd w/o 6/12 >> surgical changes of proximal gastrectomy with primary anastomosis, with no evidence of enteric contrast leak/anastomic leak or stomach wall disruption after PO contrast. Surgical changes of distal pancreatectomy & splenectomy with persisting flocculent gas/fluid in the LUQ (DDx infection, with perforation / disruption at the surgical site or stomach not likely given the absence of extraluminal contrast) CT Chest / ABD / Pelvis 6/17 >> dense bibasilar consolidation, nodular airspace disease in RML, bilateral upper lobes, large well-circumscribed flocculent gas collection in the LUQ with concern for rupture of the stomach and necrosis / abscess. Fluid & gas in the distal esophagus which extends  high into the esophagus.  Progression of subcutaneous gas in the left chest wall & right chest wall  Micro Data:  BCx2 6/8 >> negative  UC 6/8 >> negative   Antimicrobials:  Ancef 6/2 x1 (OR on call) Vanco 6/9 >>  Zosyn 6/9 >>  Anidulafungin 6/18 >>   Interim history/subjective:  RN reports pt received 2L LR bolus overnight.  Epi gtt weaned off.  Levophed at 32 mcg. Remains on protonix gtt.   Objective   Blood pressure (!) 117/57, pulse 83, temperature 98 F (36.7 C), temperature source Axillary, resp. rate 15, height 5\' 9"  (1.753 m), weight 94.9 kg, SpO2 97 %. CVP:  [10 mmHg-13 mmHg] 12 mmHg  Vent Mode: PRVC FiO2 (%):  [70 %-100 %] (P) 70 % Set Rate:  [26 bmp-34 bmp] 34 bmp Vt Set:  [550 mL-560 mL] 560 mL PEEP:  [5 cmH20-12 cmH20] 12 cmH20 Plateau Pressure:  [21 cmH20-37 cmH20] 26 cmH20   Intake/Output Summary (Last 24 hours) at 10/17/2018 0839 Last data filed at 10/17/2018 0800 Gross per 24 hour  Intake 5920.88 ml  Output 1750 ml  Net 4170.88 ml   Filed Weights   10/11/18 0500 10/12/18 0400 10/14/18 1957  Weight: 97.3 kg 98.1 kg 94.9 kg    Examination: General: critically ill appearing male lying in bed on vent HEENT: MM pink/moist, ETT, L IJ TLC c/d/i  Neuro: sedate  CV: s1s2 rrr, no m/r/g PULM: even/non-labored, lungs bilaterally clear  GI: soft, midline VAC in place, drainage catheter LUQ with thick black-green secretions  Extremities: warm/dry, no edema  Skin: no rashes or lesions  Resolved Hospital Problem list      Assessment & Plan:   Distributive Shock  -secondary to stomach necrosis  P: Follow cultures  Resume vasopressin, wean levophed for MAP >65 Follow lactate trend  Repeat afternoon labs ABX / antifungals as above   PEA Arrest -approx 8 minutes CPR before ROSC 6/17 -bloody secretions pooling at posterior pharynx on intubation  P: ICU monitoring  Pressors as above   Acute Hypoxemic Respiratory Failure  -in setting of arrest, possible  aspiration  ARDS -PF ratio 134, compatible with moderate ARDS Hx OSA  -does not use CPAP P: PRVC 8cc/kg, rate 34  Follow up ABG this afternoon  Wean PEEP / FiO2 based on ARDS protocol  Follow serial CXR  No WUA 6/18 due to hemodynamic instability    Adenocarcinoma of GE Junction s/p Laparoscopy  -proximal gastrectomy with esophagogastrostomy, pyloroplasty, jejunostomy tube placement, distal pancreatectomy and splenectomy 6/2.   -completed 3 cycles of FOLFOX -concern for LUQ gas/fluid on 6/12 CT ABD > back to OR for gastrectomy P: Begin TNA  NPO  Stop PPI gtt at 1200  Post-operative, wound care per CCS Follow cultures ABX as above Hold lovenox 6/18, defer to CCS on timing of restart  AKI AGMA / Lactic Acidosis  P: Trend BMP / urinary output Replace electrolytes as indicated Avoid nephrotoxic agents, ensure adequate renal perfusion Trend lactate  Thrombocytosis  -suspect reactive  P: Follow platelet trend > improving post surgery  Hold lovenox 6/18, defer to surgery timing of restart post gastrectomy  Moderate Protein Calorie Malnutrition  P: Begin TPN per pharmacy   HTN HLD CAD s/p CABG P: Hold home agents  ICU monitoirng  PRN lopressor for SBP>160   Best practice:  Diet: NPO  Pain/Anxiety/Delirium protocol (if indicated): Precedex, Fentanyl  VAP protocol (if indicated): In place  DVT prophylaxis: SCD's, hold lovenox 6/18 GI prophylaxis: PPI gtt  Glucose control: SSI  Mobility: BR, advance as tolerated  Code Status: Full Code  Family Communication: Per primary 6/18 Disposition: ICU   Labs   CBC: Recent Labs  Lab 10/16/18 0352 10/16/18 0934  10/16/18 1307 10/16/18 1528 10/16/18 1803 10/16/18 2048 10/17/18 0321  WBC 19.6* 17.8*  --   --  5.9  --  25.0* 35.4*  NEUTROABS  --   --   --   --   --   --  23.3* 32.9*  HGB 7.9* 7.8*   < > 9.3* 8.9* 9.5* 8.6* 8.3*  HCT 26.0* 26.7*   < > 31.8* 29.1* 28.0* 28.9* 28.0*  MCV 91.2 94.7  --   --  92.4  --   94.8 95.2  PLT 794* 698*  --   --  595*  --  568* 540*   < > = values in this interval not displayed.    Basic Metabolic Panel: Recent Labs  Lab 10/16/18 0352 10/16/18 0934 10/16/18 1015 10/16/18 1528 10/16/18 1803 10/17/18 0027 10/17/18 0321  NA 145 146* 147* 145 149* 145 145  K 3.5 3.1* 3.0* 4.0 4.1 4.3 4.5  CL 111 108  --  112*  --  113* 112*  CO2 23 23  --  19*  --  16* 14*  GLUCOSE 172* 202*  --  123*  --  120* 193*  BUN 15 18  --  22  --  24* 26*  CREATININE 0.96 1.36*  --  1.75*  --  1.84* 1.86*  CALCIUM 8.1* 7.7*  --  7.1*  --  7.0* 7.1*  MG  --   --   --   --   --   --  2.0  PHOS  --   --   --   --   --   --  5.7*   GFR: Estimated Creatinine Clearance: 37.8 mL/min (A) (by C-G formula based on SCr of 1.86 mg/dL (H)). Recent Labs  Lab 10/16/18 0934 10/16/18 1307 10/16/18 1528 10/16/18 2048 10/17/18 0317 10/17/18 0321  WBC 17.8*  --  5.9 25.0*  --  35.4*  LATICACIDVEN 5.7* 7.7*  --  8.9* >11.0*  --     Liver Function Tests: Recent Labs  Lab 10/16/18 0934 10/16/18 1528 10/17/18 0027 10/17/18 0321  AST 29 53* 54* 54*  ALT 20 34 33 36  ALKPHOS 93 64 65 60  BILITOT 0.7 0.9 1.0 1.0  PROT 5.3* 5.0* 4.9* 4.7*  ALBUMIN 2.0* 1.7* 1.9* 1.8*   No results for input(s): LIPASE, AMYLASE in the last 168 hours. No results for input(s): AMMONIA in the last 168 hours.  ABG    Component Value Date/Time   PHART 7.242 (L) 10/17/2018 0325   PCO2ART 34.4 10/17/2018 0325   PO2ART 94.7 10/17/2018 0325   HCO3 14.3 (L) 10/17/2018 0325   TCO2 24 10/16/2018 1803   ACIDBASEDEF 11.6 (H) 10/17/2018 0325   O2SAT 95.5 10/17/2018 0325     Coagulation Profile: Recent Labs  Lab 10/11/18 0227 10/16/18 1528  INR 1.2 1.4*    Cardiac Enzymes: Recent Labs  Lab 10/16/18 0934 10/16/18 1528 10/16/18 2128  TROPONINI 0.28* 0.64* 0.38*    HbA1C: Hemoglobin A1C  Date/Time Value Ref Range Status  05/20/2018 09:04 AM 5.9 (A) 4.0 - 5.6 % Final  11/16/2017 08:56 AM 6.5 (A)  4.0 - 5.6 % Final   Hgb A1c MFr Bld  Date/Time Value Ref Range Status  09/25/2018 11:18 AM 5.8 (H) 4.8 - 5.6 % Final    Comment:    (NOTE) Pre diabetes:          5.7%-6.4% Diabetes:              >6.4% Glycemic control for   <7.0% adults with diabetes   10/22/2015 11:26 AM 6.3 4.6 - 6.5 % Final    Comment:    Glycemic Control Guidelines for People with Diabetes:Non Diabetic:  <6%Goal of Therapy: <7%Additional Action Suggested:  >8%     CBG: Recent Labs  Lab 10/16/18 1508 10/16/18 2016 10/16/18 2311 10/17/18 0316 10/17/18 0718  GLUCAP 118* 101* 108* 145* 193*    Critical care time: 40 minutes      [name], NP-C [facility] Pgr: [phone] or if no answer [phone] 10/17/2018, 8:39 AM

## 2018-10-17 NOTE — Progress Notes (Signed)
[facility] Progress Note Patient Name: Tristan Hughes DOB: 1941-03-06 MRN: [id number]   Date of Service  10/17/2018  HPI/Events of Note  AM labs reported Lactate 11, but patient is off epinephrine totally, with Map in 70s on levo/vaso. Vaso and levo also down as compared to earlier doses Seen on camera - wakes up, grimaces to painful stimuli, making urine , belly is slightly distended but soft and otherwise unchanged  eICU Interventions  Bicarb push x 1, LR bolus x 1 Continue IV bicarb Will aim to take vaso off this AM  Continue levophed ABG and lactate at 7.30 am Fio2 already dropped to 80 and will plan on lowering to 70% soon as o2 sats are 99 Overall very ill and prognosis guarded      Intervention Category Minor Interventions: Clinical assessment - ordering diagnostic tests;Communication with other healthcare providers and/or family  [name] 10/17/2018, 4:47 AM

## 2018-10-17 NOTE — Progress Notes (Signed)
Pt scheduled for surgery 1400 today Dr. Barry Dienes at bedside. A line positional and unable to get ABG. Dr. Barry Dienes states it's ok anesthesia should be on their way soon and can obtain in OR. This RN will communicate when they arrive to get pt.

## 2018-10-17 NOTE — Consult Note (Deleted)
NAME:  Tristan Hughes, MRN:  [id number], DOB:  12/04/1940, LOS: 30 ADMISSION DATE:  10/01/2018, CONSULTATION DATE:  10/16/18 REFERRING MD:  Dr. Barry Dienes , CHIEF COMPLAINT:  Respiratory Distress   Brief History   78 y/o M admitted 6/2 for planned laparoscopy in setting of known adenocarcinoma of the GE junction.  He is s/p proximal gastrectomy with esophagogastrostomy, pyloroplasty, jejunostomy tube placement, distal pancreatectomy and splenectomy.  There were concerns for possible leak post operatively.  CT of the ABD 6/12 raised concern for gas/fluid in the LUQ.  He developed respiratory distress 6/17 and was transferred to ICU.  Post intubation suffered PEA arrest requiring ~ 8 minutes of CPR before ROSC. Repeat ABD imaging 6/17 with concerns for stomach necrosis. He returned to the OR for completion gastrectomy.   Past Medical History  OSA  CAD HTN HLD GERD Gastric Cancer - identified 03/2018, s/p three cycles of FOLFOX DM II  Depression  Anxiety   Significant Hospital Events   6/02 Admit  6/17 PCCM consulted for resp distress, PEA arrest post intubation  Consults:  PCCM 6/17   Procedures:  ETT 6/17 >> L IJ TLC 6/17 >>  L Radial Aline 6/17 >>   Significant Diagnostic Tests:  CT Abd w/o 6/12 >> surgical changes of proximal gastrectomy with primary anastomosis, with no evidence of enteric contrast leak/anastomic leak or stomach wall disruption after PO contrast. Surgical changes of distal pancreatectomy & splenectomy with persisting flocculent gas/fluid in the LUQ (DDx infection, with perforation / disruption at the surgical site or stomach not likely given the absence of extraluminal contrast) CT Chest / ABD / Pelvis 6/17 >> dense bibasilar consolidation, nodular airspace disease in RML, bilateral upper lobes, large well-circumscribed flocculent gas collection in the LUQ with concern for rupture of the stomach and necrosis / abscess. Fluid & gas in the distal esophagus which extends  high into the esophagus.  Progression of subcutaneous gas in the left chest wall & right chest wall  Micro Data:  BCx2 6/8 >> negative  UC 6/8 >> negative   Antimicrobials:  Ancef 6/2 x1 (OR on call) Vanco 6/9 >>  Zosyn 6/9 >>  Anidulafungin 6/18 >>   Interim history/subjective:  RN reports pt received 2L LR bolus overnight.  Epi gtt weaned off.  Levophed at 32 mcg. Remains on protonix gtt.   Objective   Blood pressure (!) 118/54, pulse 85, temperature 98.5 F (36.9 C), temperature source Axillary, resp. rate (!) 23, height 5\' 9"  (1.753 m), weight 94.9 kg, SpO2 98 %. CVP:  [10 mmHg-13 mmHg] 12 mmHg  Vent Mode: PRVC FiO2 (%):  [60 %-100 %] 80 % Set Rate:  [10 bmp-34 bmp] 34 bmp Vt Set:  [550 mL-560 mL] 560 mL PEEP:  [5 cmH20-12 cmH20] 12 cmH20 Pressure Support:  [5 cmH20] 5 cmH20 Plateau Pressure:  [21 cmH20-37 cmH20] 26 cmH20   Intake/Output Summary (Last 24 hours) at 10/17/2018 0741 Last data filed at 10/17/2018 0600 Gross per 24 hour  Intake 5506.49 ml  Output 2150 ml  Net 3356.49 ml   Filed Weights   10/11/18 0500 10/12/18 0400 10/14/18 1957  Weight: 97.3 kg 98.1 kg 94.9 kg    Examination: General: critically ill appearing male lying in bed on vent HEENT: MM pink/moist, ETT, L IJ TLC c/d/i  Neuro: sedate  CV: s1s2 rrr, no m/r/g PULM: even/non-labored, lungs bilaterally clear  GI: soft, midline VAC in place, drainage catheter LUQ with thick black-green secretions  Extremities: warm/dry, no edema  Skin: no rashes or lesions  Resolved Hospital Problem list      Assessment & Plan:   Distributive Shock  -secondary to stomach necrosis  P: Follow cultures  Resume vasopressin, wean levophed for MAP >65 Follow lactate trend  Repeat afternoon labs ABX / antifungals as above   PEA Arrest -approx 8 minutes CPR before ROSC 6/17 -bloody secretions pooling at posterior pharynx on intubation  P: ICU monitoring  Pressors as above   Acute Hypoxemic Respiratory  Failure  -in setting of arrest, possible aspiration  ARDS -PF ratio 134, compatible with moderate ARDS Hx OSA  -does not use CPAP P: PRVC 8cc/kg, rate 34  Follow up ABG this afternoon  Wean PEEP / FiO2 based on ARDS protocol  Follow serial CXR  No WUA 6/18 due to hemodynamic instability    Adenocarcinoma of GE Junction s/p Laparoscopy  -proximal gastrectomy with esophagogastrostomy, pyloroplasty, jejunostomy tube placement, distal pancreatectomy and splenectomy 6/2.   -completed 3 cycles of FOLFOX -concern for LUQ gas/fluid on 6/12 CT ABD > back to OR for gastrectomy P: Begin TNA  NPO  Stop PPI gtt at 1200  Post-operative, wound care per CCS Follow cultures ABX as above Hold lovenox 6/18, defer to CCS on timing of restart  AKI AGMA / Lactic Acidosis  P: Trend BMP / urinary output Replace electrolytes as indicated Avoid nephrotoxic agents, ensure adequate renal perfusion Trend lactate  Thrombocytosis  -suspect reactive  P: Follow platelet trend > improving post surgery  Hold lovenox 6/18, defer to surgery timing of restart post gastrectomy  Moderate Protein Calorie Malnutrition  P: Begin TPN per pharmacy   HTN HLD CAD s/p CABG P: Hold home agents  ICU monitoirng  PRN lopressor for SBP>160   Best practice:  Diet: NPO  Pain/Anxiety/Delirium protocol (if indicated): Precedex, Fentanyl  VAP protocol (if indicated): In place  DVT prophylaxis: SCD's, hold lovenox 6/18 GI prophylaxis: PPI gtt  Glucose control: SSI  Mobility: BR, advance as tolerated  Code Status: Full Code  Family Communication: Per primary 6/18 Disposition: ICU   Labs   CBC: Recent Labs  Lab 10/16/18 0352 10/16/18 0934 10/16/18 1015 10/16/18 1307 10/16/18 1528 10/16/18 2048 10/17/18 0321  WBC 19.6* 17.8*  --   --  5.9 25.0* 35.4*  NEUTROABS  --   --   --   --   --  23.3* 32.9*  HGB 7.9* 7.8* 9.9* 9.3* 8.9* 8.6* 8.3*  HCT 26.0* 26.7* 29.0* 31.8* 29.1* 28.9* 28.0*  MCV 91.2 94.7   --   --  92.4 94.8 95.2  PLT 794* 698*  --   --  595* 568* 540*    Basic Metabolic Panel: Recent Labs  Lab 10/16/18 0352 10/16/18 0934 10/16/18 1015 10/16/18 1528 10/17/18 0027 10/17/18 0321  NA 145 146* 147* 145 145 145  K 3.5 3.1* 3.0* 4.0 4.3 4.5  CL 111 108  --  112* 113* 112*  CO2 23 23  --  19* 16* 14*  GLUCOSE 172* 202*  --  123* 120* 193*  BUN 15 18  --  22 24* 26*  CREATININE 0.96 1.36*  --  1.75* 1.84* 1.86*  CALCIUM 8.1* 7.7*  --  7.1* 7.0* 7.1*  MG  --   --   --   --   --  2.0  PHOS  --   --   --   --   --  5.7*   GFR: Estimated Creatinine Clearance: 37.8 mL/min (A) (by C-G formula  based on SCr of 1.86 mg/dL (H)). Recent Labs  Lab 10/16/18 0934 10/16/18 1307 10/16/18 1528 10/16/18 2048 10/17/18 0317 10/17/18 0321  WBC 17.8*  --  5.9 25.0*  --  35.4*  LATICACIDVEN 5.7* 7.7*  --  8.9* >11.0*  --     Liver Function Tests: Recent Labs  Lab 10/16/18 0934 10/16/18 1528 10/17/18 0027 10/17/18 0321  AST 29 53* 54* 54*  ALT 20 34 33 36  ALKPHOS 93 64 65 60  BILITOT 0.7 0.9 1.0 1.0  PROT 5.3* 5.0* 4.9* 4.7*  ALBUMIN 2.0* 1.7* 1.9* 1.8*   No results for input(s): LIPASE, AMYLASE in the last 168 hours. No results for input(s): AMMONIA in the last 168 hours.  ABG    Component Value Date/Time   PHART 7.242 (L) 10/17/2018 0325   PCO2ART 34.4 10/17/2018 0325   PO2ART 94.7 10/17/2018 0325   HCO3 14.3 (L) 10/17/2018 0325   TCO2 25 10/16/2018 1015   ACIDBASEDEF 11.6 (H) 10/17/2018 0325   O2SAT 95.5 10/17/2018 0325     Coagulation Profile: Recent Labs  Lab 10/11/18 0227 10/16/18 1528  INR 1.2 1.4*    Cardiac Enzymes: Recent Labs  Lab 10/16/18 0934 10/16/18 1528 10/16/18 2128  TROPONINI 0.28* 0.64* 0.38*    HbA1C: Hemoglobin A1C  Date/Time Value Ref Range Status  05/20/2018 09:04 AM 5.9 (A) 4.0 - 5.6 % Final  11/16/2017 08:56 AM 6.5 (A) 4.0 - 5.6 % Final   Hgb A1c MFr Bld  Date/Time Value Ref Range Status  09/25/2018 11:18 AM 5.8 (H)  4.8 - 5.6 % Final    Comment:    (NOTE) Pre diabetes:          5.7%-6.4% Diabetes:              >6.4% Glycemic control for   <7.0% adults with diabetes   10/22/2015 11:26 AM 6.3 4.6 - 6.5 % Final    Comment:    Glycemic Control Guidelines for People with Diabetes:Non Diabetic:  <6%Goal of Therapy: <7%Additional Action Suggested:  >8%     CBG: Recent Labs  Lab 10/16/18 1508 10/16/18 2016 10/16/18 2311 10/17/18 0316 10/17/18 0718  GLUCAP 118* 101* 108* 145* 193*    Critical care time: 40 minutes      [name], NP-C [facility] Pgr: [phone] or if no answer [phone] 10/17/2018, 7:41 AM

## 2018-10-17 NOTE — Progress Notes (Signed)
[facility] CONSULT NOTE   Pharmacy Consult for TPN Indication: post-gastrectomy  Patient Measurements: Height: 5\' 9"  (175.3 cm) Weight: 209 lb 3.5 oz (94.9 kg) IBW/kg (Calculated) : 70.7 TPN AdjBW (KG): 76.7 Body mass index is 30.9 kg/m. Usual Weight: ~220 lbs  Assessment:  78 yo M admitted on 6/2 for laparoscopy in the setting of known adenocarcinoma of the GE junction. S/p proximal gastrectomy with esophagogastrostomy, pyloroplasty, jejunostomy tube placement, distal pancreatectomy and splenectomy. Developed respiratory distress requiring intubation and a PEA arrest on 6/17. Repeat abdominal imaging 6/17 with concern for stomach necrosis. S/p complete gastrectomy 6/18. Has been receiving tube feeds since 6/4 (stopped 6/18) and has moderate protein caloric malnutrition.   GI: TF 6/4>>6/18, albumin 1.8, prealbumin 5.3 Endo: prev requiring insulin glargine 40 daily + aspart ~30 units daily; glargine discontinued 6/17 Insulin requirements in the past 24 hours: 12 units aspart Lytes: K 4.5, coCa 8.9, Na 145, Cl 112, phos 5.7; Ca x phos = 50.8 Renal: SCr 1.86 (baseline ~ 0.8). sodium bicarb gtt @ 100 ml/hr Pulm: intubated 6/17 for acute hypoxemic respiratory failure, possible aspiration, moderate ARDS Cards: on norepi and vasopressin, s/p PEA arrest 6/17 Hepatobil: AST/ALT 54/36, tbili wnl Neuro: sedated on fentanyl and versed ID: abx for aspiration PNA and peritonitis. WBC 35.4, LA 10.4, fevers 6/17  6/9 vancomycin > 6/9 zosyn > 6/19 Eraxis >  6/8 blood cx:ng-final 6/8 urine cx:ng-final  TPN Access: central line placed 6/17 TPN start date: 6/18 Nutritional Goals (awaiting RD recommendations) KCal: 2400 kcal Protein: 120 g Fluid: 3,000 mL  Goal TPN rate is 88 ml/hr (provides 2160 mL/day)  Current Nutrition: tube feeds off 6/17, NPO, TPN to start today  Plan:  Initiate TPN at 40 mL/hr. Hold 20% lipid emulsion for first 7 days for ICU  patients per ASPEN guidelines (Start date 6/25) This TPN provides 52.8 g of protein, 250 g of dextrose, and 0 g of lipids which provides 1060 kCals per day, meeting 44% of patient needs Electrolytes in TPN: mostly standard electrolytes, chloride/acetate to max acetate, no phos added Add MVI, no trace elements (only MWF), and 0 units of regular insulin to TPN  Resistant SSI q4h ordered, adjust as needed No IVMF, on sodium bicarb gtt running at 100 mL/hr Monitor TPN labs, CBGs, fluid status, sodium bicarb LOT  Vertis Kelch, PharmD PGY1 Pharmacy Resident Phone [phone] 10/17/2018       10:53 AM

## 2018-10-17 NOTE — Anesthesia Postprocedure Evaluation (Signed)
Anesthesia Post Note  Patient: Tristan Hughes  Procedure(s) Performed: EXPLORATORY LAPAROTOMY (N/A Abdomen) Application Of Wound Vac (N/A Abdomen)     Patient location during evaluation: SICU Anesthesia Type: General Level of consciousness: sedated Pain management: pain level controlled Vital Signs Assessment: post-procedure vital signs reviewed and stable Respiratory status: patient remains intubated per anesthesia plan Cardiovascular status: stable Postop Assessment: no apparent nausea or vomiting Anesthetic complications: no    Last Vitals:  Vitals:   10/17/18 0430 10/17/18 0445  BP: (!) 131/58 (!) 118/56  Pulse: 83 83  Resp: 10 (!) 30  Temp:    SpO2: 99% 99%    Last Pain:  Vitals:   10/17/18 0400  TempSrc: Axillary  PainSc:                  Tristan Hughes

## 2018-10-17 NOTE — Op Note (Signed)
PRE-OPERATIVE DIAGNOSIS: necrotic stomach  POST-OPERATIVE DIAGNOSIS:  Same  PROCEDURE:  Procedure(s): Reopening of recent laparotomy, removal of Abthera wound vac, placement of drains, closure of fascia  SURGEON:  Surgeon(s): Stark Klein, MD  ASSIST: Nedra Hai, MD  ANESTHESIA:   general  DRAINS: (4) Blake drain(s)  #1 - near closure of pylorus to gravity bag drainage #2 - near enterotomy of small bowel to gravity bag drainage #3 - in mediastinum near open end of esophagus #4 - in LUQ   LOCAL MEDICATIONS USED:  NONE  SPECIMEN:  No Specimen  DISPOSITION OF SPECIMEN:  N/A  COUNTS:  YES  DICTATION: .Dragon Dictation  PLAN OF CARE: back to ICU  PATIENT DISPOSITION:  improving hemodynamics  FINDINGS:  Small amount murky fluid present. No bile or succus from pylorus of small bowel site  EBL: min  PROCEDURE:  Patient was identified in the ICU and taken to the operating room where he was placed supine on operating room table.  General anesthesia was induced.  His arterial line was fixed to work more properly.  The outer covering of the wound VAC was removed.  The abdomen was then prepped and draped in sterile fashion.  Timeout was performed according to the surgical safety checklist.  When all was correct, we continued.  The inner sponge and the abdominal drape was removed.  The left upper quadrant was examined.  There was a very small volume of murky fluid present.  This was suctioned out.  The pylorus was examined and there was no evidence of bile leaking from this closure.  There is a Prolene suture closing this.  The mall intestine enterotomy site was examined in the mid abdomen and there was no evidence of succus leaking.  The abdomen was copiously irrigated until clear.  Care was taken to minimize additional mobilization to avoid damage to the small bowel.  4 Blake drains were placed.  These were labeled by me.  The most superior right-sided drain was placed near the  opening of the pylorus and put to gravity bag drainage.  The more inferior drain on the right was placed near the small bowel enterotomy closure site.  On the left, the more superior drain was placed into the mediastinum along the aorta, the palpable NG tube, and where presumably the esophageal stump is.  The more inferior left-sided drain was placed in the left upper quadrant to capture the fluid collection in the subdiaphragmatic space.  The J-tube was left in place and it is the lowest tube on the left side.  There was adequate intra-abdominal domain.  The fascia was closed using running #1 looped PDS suture.  The skin was irrigated and a wound VAC was placed on the skin.  The drains were dressed.  The patient was allowed to emerge from anesthesia and was taken back to the ICU in improved condition.  Pressors were weaned some more.  Needle, sponge, and instrument counts were correct x 2.

## 2018-10-17 NOTE — Progress Notes (Signed)
Pt transported down to OR on the ventilator. No complications during transfer.

## 2018-10-17 NOTE — Progress Notes (Signed)
[facility] Progress Note Patient Name: GIORGIO CHABOT DOB: 1940-06-01 MRN: [id number]   Date of Service  10/17/2018  HPI/Events of Note  Reviewed CMP and ABG  eICU Interventions  Given the high pressor needs, start bicarb infusion after a push Repeat ABG at 3.30 am with lactate and AM labs Stop the NS Continue to monitor A fib - rates are just low 100s to 110 for now      Intervention Category Major Interventions: Arrhythmia - evaluation and management;Other:  [name] 10/17/2018, 1:23 AM

## 2018-10-17 NOTE — Progress Notes (Signed)
1 Day Post-Op    CC: sepsis  Subjective: Acute respiratory distress yesterday and coded upon intubation with clinical aspiration.  Coded again several hours later.  Taken back to OR and had completely dead stomach.  This was debrided.  Abd washed out.  Family visited.    Objective: Vital signs in last 24 hours: Temp:  [98 F (36.7 C)-101.9 F (38.8 C)] 98.8 F (37.1 C) (06/18 1200) Pulse Rate:  [71-119] 71 (06/18 1400) Resp:  [10-34] 34 (06/18 1400) BP: (75-163)/(43-63) 130/58 (06/18 1345) SpO2:  [92 %-100 %] 98 % (06/18 1400) Arterial Line BP: (72-188)/(41-81) 83/60 (06/18 1400) FiO2 (%):  [65 %-100 %] 65 % (06/18 1127) Last BM Date: 10/15/18  Intake/Output from previous day: 06/17 0701 - 06/18 0700 In: 5738.4 [I.V.:3017.1; Blood:466.2; IV Piggyback:2255.1] Out: 2650 [Urine:2175; Emesis/NG output:200; Drains:225; Blood:50] Intake/Output this shift: Total I/O In: 1266.6 [I.V.:1214.6; IV Piggyback:52.1] Out: 500 [Urine:350; Drains:150]  General appearance: intubated, sedated.   Resp: synchronous on vent.   Cardio: regular, tachy GI: vac in place  Extremities: Trace edema good distal pulses.  Lab Results:  Recent Labs    10/16/18 2048 10/17/18 0321  WBC 25.0* 35.4*  HGB 8.6* 8.3*  HCT 28.9* 28.0*  PLT 568* 540*    BMET Recent Labs    10/17/18 0027 10/17/18 0321  NA 145 145  K 4.3 4.5  CL 113* 112*  CO2 16* 14*  GLUCOSE 120* 193*  BUN 24* 26*  CREATININE 1.84* 1.86*  CALCIUM 7.0* 7.1*   PT/INR Recent Labs    10/16/18 1528  LABPROT 16.7*  INR 1.4*    Recent Labs  Lab 10/16/18 0934 10/16/18 1528 10/17/18 0027 10/17/18 0321  AST 29 53* 54* 54*  ALT 20 34 33 36  ALKPHOS 93 64 65 60  BILITOT 0.7 0.9 1.0 1.0  PROT 5.3* 5.0* 4.9* 4.7*  ALBUMIN 2.0* 1.7* 1.9* 1.8*     Lipase  No results found for: LIPASE   Medications: . bisacodyl  10 mg Rectal Daily  . chlorhexidine  15 mL Mouth Rinse BID  . chlorhexidine gluconate (MEDLINE KIT)  15 mL  Mouth Rinse BID  . Chlorhexidine Gluconate Cloth  6 each Topical Daily  . Chlorhexidine Gluconate Cloth  6 each Topical Daily  . insulin aspart  0-20 Units Subcutaneous Q4H  . mouth rinse  15 mL Mouth Rinse 10 times per day  . [START ON 10/19/2018] pantoprazole  40 mg Intravenous Q12H  . sodium chloride flush  10-40 mL Intracatheter Q12H  . vancomycin variable dose per unstable renal function (pharmacist dosing)   Does not apply See admin instructions   . sodium chloride Stopped (10/17/18 0933)  . [START ON 10/18/2018] anidulafungin    . fentaNYL infusion INTRAVENOUS 150 mcg/hr (10/17/18 1400)  . midazolam 3 mg/hr (10/17/18 1400)  . norepinephrine (LEVOPHED) Adult infusion 20 mcg/min (10/17/18 1400)  . pantoprozole (PROTONIX) infusion 8 mg/hr (10/17/18 1400)  . piperacillin-tazobactam (ZOSYN)  IV Stopped (10/17/18 1333)  .  sodium bicarbonate  infusion 1000 mL 100 mL/hr at 10/17/18 1400  . TPN ADULT (ION)    . vasopressin (PITRESSIN) infusion - *FOR SHOCK* 0.03 Units/min (10/17/18 1400)    Assessment/Plan Acute respiratory distress New congestive failure Anemia New fever   Adenocarcinoma of the gastric cardia, pancreatic neuroendocrine tumor S/p diagnostic laparoscopy, proximal gastrectomy with esophago-gastrostomy, pyloroplasty, jejunostomy tube placement, intraoperative ultrasound, distal pancreatectomy, splenectomy 10/01/2018, Dr. Faera Byerly POD #15  -UGI 10/08/2018 shows mild posterior leak. -acute respiratory distress, intubated,   coded x 2, placed on 3 pressors -takeback 6/17 with resection of necrotic stomach, open end of distal esophagus, abthera vac  TNA Sepsis Broad spectrum antibiotics- added antifungal yesterday. Plan takeback today for washout, wide drainage, and possible fascial closure.  Discussed with Dr. Servando Snare.  TENTATIVE PLAN to create conduit in around 6 weeks depending on recovery.    Discussed with daughter and wife at bedside.     Acute kidney  injury     LOS: 16 days    Tristan Hughes 10/17/2018 [phone]

## 2018-10-17 NOTE — Progress Notes (Signed)
Tristan Hughes DOB: 09/26/1940 MRN: [id number]   Date of Service  10/17/2018  HPI/Events of Note  Camera assessment Fio2 down to 70, o2 sat 99 Off vaso, off epi, and on levophed with good MAPs, making urine CVP 10-13   eICU Interventions  Follow AM abg/lactate as planned      Intervention Category Minor Interventions: Clinical assessment - ordering diagnostic tests  [name] 10/17/2018, 6:54 AM

## 2018-10-17 NOTE — Progress Notes (Signed)
CRITICAL VALUE ALERT  Critical Value:  Lactic acid 8.4 Date & Time Notied:  10/17/2018 1439  Provider Notified: MD aware  Orders Received/Actions taken: no new orders trending down

## 2018-10-17 NOTE — Transfer of Care (Signed)
Immediate Anesthesia Transfer of Care Note  Patient: Tristan Hughes  Procedure(s) Performed: RE-OPEN  LAPAROTOMY (N/A Abdomen)  Patient Location: ICU  Anesthesia Type:General  Level of Consciousness: Patient remains intubated per anesthesia plan  Airway & Oxygen Therapy: Patient remains intubated per anesthesia plan and Patient placed on Ventilator (see vital sign flow sheet for setting)  Post-op Assessment: Report given to RN and Post -op Vital signs reviewed and stable  Post vital signs: Reviewed and stable  Last Vitals:  Vitals Value Taken Time  BP 140/63 10/17/18 1640  Temp    Pulse 78 10/17/18 1641  Resp 34 10/17/18 1641  SpO2 98 % 10/17/18 1641  Vitals shown include unvalidated device data.  Last Pain:  Vitals:   10/17/18 1200  TempSrc: Axillary  PainSc:       Patients Stated Pain Goal: 0 (22/58/34 6219)  Complications: No apparent anesthesia complications

## 2018-10-17 NOTE — Progress Notes (Signed)
Nutrition Follow-up  DOCUMENTATION CODES:   Not applicable  INTERVENTION:   Pharmacy to adjust TPN to meet nutrition needs  Recommend trickle feedings via j-tube as able with Osmolite 1.5 @ 10 ml/hr  NUTRITION DIAGNOSIS:   Increased nutrient needs related to cancer and cancer related treatments as evidenced by estimated needs.  Ongoing  GOAL:   Patient will meet greater than or equal to 90% of their needs  Progressing  MONITOR:   TF tolerance, Labs  REASON FOR ASSESSMENT:   Consult Enteral/tube feeding initiation and management  ASSESSMENT:   Pt with PMH of HTN, HLD, GERD, DM, CAD and gastric cancer dx 03/2018 s/p chemo (FOLFOX) who is now s/p diagnostic laparoscopy, proximal gastrectomy with esophagogastrostomy, pyloroplasty, jejunostomy tube placement, intraoperative ultrasound, distal pancreatectomy, and splenectomy on 6/2.   6/2- s/p Procedure(s): Diagnostic laparoscopy, proximal gastrectomy with esophagogastrostomy, pyloroplasty, jejunostomy tube placement, intraoperative ultrasound, distal pancreatectomy, splenectomy 6/4- trickle feeds initiated 6/8- TF at goal 6/11- UGI reveals probable small leak 6/13- transferred from PCU to floor  6/15- NGT d/c 6/17- cardiac arrest with ischemic stomach s/p completion gastrectomy for ischemic stomach, wash out, and placement of a Abthera wound vac.  6/18 TPN started - 40 ml/hr without lipids. Provides: 1060 kcal and 52 grams protein. No need to withhold lipids   Noted events of 6/17. Pt was on 3 pressors, need has decreased.  Pt started on TPN, open abd with VAC.   Patient is currently intubated on ventilator support MV: 18.3 L/min Temp (24hrs), Avg:99.3 F (37.4 C), Min:98 F (36.7 C), Max:101.9 F (38.8 C) MAP: 51-78  Medications reviewed and include: ducolax, SSI Lephoed @ 20 mcg  Vasopressin @  .03 units Na bicarb @ 100 ml/hr  Labs reviewed: Lactic acid 10.4 (H) CBG's: 193-217 Pt positive 9 L, mild edema  noted VAC: 200 ml yesterday, 50 ml today OG tube, ends in esophagus   Diet Order:   Diet Order            Diet NPO time specified  Diet effective now              EDUCATION NEEDS:   No education needs have been identified at this time  Skin:  Skin Assessment: Skin Integrity Issues: Skin Integrity Issues:: Incisions Incisions: open abd with VAC  Last BM:  10/15/18  Height:   Ht Readings from Last 1 Encounters:  10/16/18 5\' 9"  (1.753 m)    Weight:   Wt Readings from Last 1 Encounters:  10/14/18 94.9 kg    Ideal Body Weight:  72.7 kg  BMI:  Body mass index is 30.9 kg/m.  Estimated Nutritional Needs:   Kcal:  2438  Protein:  135-175 grams  Fluid:  >2 L/day  Maylon Peppers RD, LDN, CNSC [phone] Pager [phone] After Hours Pager

## 2018-10-17 NOTE — Anesthesia Preprocedure Evaluation (Signed)
Anesthesia Evaluation  Patient identified by MRN, date of birth, ID band Patient unresponsive    Reviewed: Unable to perform ROS - Chart review onlyPreop documentation limited or incomplete due to emergent nature of procedure.  Airway Mallampati: Intubated  TM Distance: >3 FB Neck ROM: Full    Dental no notable dental hx.    Pulmonary sleep apnea ,    Pulmonary exam normal breath sounds clear to auscultation       Cardiovascular hypertension, Pt. on medications + CAD  Normal cardiovascular exam Rhythm:Regular Rate:Normal     Neuro/Psych Anxiety Depression    GI/Hepatic GERD  Medicated and Controlled,  Endo/Other  diabetes  Renal/GU      Musculoskeletal   Abdominal   Peds  Hematology   Anesthesia Other Findings   Reproductive/Obstetrics                             Anesthesia Physical  Anesthesia Plan  ASA: IV  Anesthesia Plan: General   Post-op Pain Management:    Induction: Intravenous  PONV Risk Score and Plan: 2 and Treatment may vary due to age or medical condition  Airway Management Planned: Oral ETT  Additional Equipment:   Intra-op Plan:   Post-operative Plan: Post-operative intubation/ventilation  Informed Consent: I have reviewed the patients History and Physical, chart, labs and discussed the procedure including the risks, benefits and alternatives for the proposed anesthesia with the patient or authorized representative who has indicated his/her understanding and acceptance.       Plan Discussed with: Surgeon  Anesthesia Plan Comments:         Anesthesia Quick Evaluation

## 2018-10-17 NOTE — Progress Notes (Signed)
CRITICAL VALUE ALERT  Critical Value:  Lactic acid 10.4 Date & Time Notied:   10/17/2018 0853  Provider Notified: Dr. Halford Chessman on unit 0930 Orders Received/Actions taken: no new orders

## 2018-10-18 ENCOUNTER — Inpatient Hospital Stay (HOSPITAL_COMMUNITY): Payer: Medicare Other

## 2018-10-18 ENCOUNTER — Encounter (HOSPITAL_COMMUNITY): Payer: Self-pay | Admitting: General Surgery

## 2018-10-18 LAB — BASIC METABOLIC PANEL
Anion gap: 12 (ref 5–15)
Anion gap: 7 (ref 5–15)
BUN: 33 mg/dL — ABNORMAL HIGH (ref 8–23)
BUN: 32 mg/dL — ABNORMAL HIGH (ref 8–23)
CO2: 26 mmol/L (ref 22–32)
CO2: 28 mmol/L (ref 22–32)
Calcium: 7.1 mg/dL — ABNORMAL LOW (ref 8.9–10.3)
Calcium: 7.1 mg/dL — ABNORMAL LOW (ref 8.9–10.3)
Chloride: 107 mmol/L (ref 98–111)
Chloride: 112 mmol/L — ABNORMAL HIGH (ref 98–111)
Creatinine, Ser: 1.34 mg/dL — ABNORMAL HIGH (ref 0.61–1.24)
Creatinine, Ser: 1.09 mg/dL (ref 0.61–1.24)
GFR calc Af Amer: 59 mL/min — ABNORMAL LOW (ref >60)
GFR calc Af Amer: >60 mL/min (ref >60)
GFR calc non Af Amer: 51 mL/min — ABNORMAL LOW (ref >60)
GFR calc non Af Amer: >60 mL/min (ref >60)
Glucose, Bld: 433 mg/dL — ABNORMAL HIGH (ref 70–99)
Glucose, Bld: 349 mg/dL — ABNORMAL HIGH (ref 70–99)
Potassium: 3.2 mmol/L — ABNORMAL LOW (ref 3.5–5.1)
Potassium: 3.3 mmol/L — ABNORMAL LOW (ref 3.5–5.1)
Sodium: 145 mmol/L (ref 135–145)
Sodium: 147 mmol/L — ABNORMAL HIGH (ref 135–145)

## 2018-10-18 LAB — POCT I-STAT 7, (LYTES, BLD GAS, ICA,H+H)
Acid-Base Excess: 7 mmol/L — ABNORMAL HIGH (ref 0.0–2.0)
Acid-base deficit: 2 mmol/L (ref 0.0–2.0)
Bicarbonate: 21.1 mmol/L (ref 20.0–28.0)
Bicarbonate: 29.8 mmol/L — ABNORMAL HIGH (ref 20.0–28.0)
Calcium, Ion: 1 mmol/L — ABNORMAL LOW (ref 1.15–1.40)
Calcium, Ion: 1.08 mmol/L — ABNORMAL LOW (ref 1.15–1.40)
HCT: 26 % — ABNORMAL LOW (ref 39.0–52.0)
HCT: 23 % — ABNORMAL LOW (ref 39.0–52.0)
Hemoglobin: 8.8 g/dL — ABNORMAL LOW (ref 13.0–17.0)
Hemoglobin: 7.8 g/dL — ABNORMAL LOW (ref 13.0–17.0)
O2 Saturation: 96 %
O2 Saturation: 94 %
Patient temperature: 98.6
Potassium: 3.9 mmol/L (ref 3.5–5.1)
Potassium: 3.2 mmol/L — ABNORMAL LOW (ref 3.5–5.1)
Sodium: 147 mmol/L — ABNORMAL HIGH (ref 135–145)
Sodium: 148 mmol/L — ABNORMAL HIGH (ref 135–145)
TCO2: 22 mmol/L (ref 22–32)
TCO2: 31 mmol/L (ref 22–32)
pCO2 arterial: 30.1 mmHg — ABNORMAL LOW (ref 32.0–48.0)
pCO2 arterial: 32 mmHg (ref 32.0–48.0)
pH, Arterial: 7.453 — ABNORMAL HIGH (ref 7.350–7.450)
pH, Arterial: 7.577 — ABNORMAL HIGH (ref 7.350–7.450)
pO2, Arterial: 74 mmHg — ABNORMAL LOW (ref 83.0–108.0)
pO2, Arterial: 58 mmHg — ABNORMAL LOW (ref 83.0–108.0)

## 2018-10-18 LAB — CULTURE, RESPIRATORY W GRAM STAIN

## 2018-10-18 LAB — CBC
HCT: 23.3 % — ABNORMAL LOW (ref 39.0–52.0)
Hemoglobin: 7.2 g/dL — ABNORMAL LOW (ref 13.0–17.0)
MCH: 27.6 pg (ref 26.0–34.0)
MCHC: 30.9 g/dL (ref 30.0–36.0)
MCV: 89.3 fL (ref 80.0–100.0)
Platelets: 421 10*3/uL — ABNORMAL HIGH (ref 150–400)
RBC: 2.61 MIL/uL — ABNORMAL LOW (ref 4.22–5.81)
RDW: 16.1 % — ABNORMAL HIGH (ref 11.5–15.5)
WBC: 21.7 10*3/uL — ABNORMAL HIGH (ref 4.0–10.5)
nRBC: 3.9 % — ABNORMAL HIGH (ref 0.0–0.2)

## 2018-10-18 LAB — PHOSPHORUS
Phosphorus: <1 mg/dL — CL (ref 2.5–4.6)
Phosphorus: 1.6 mg/dL — ABNORMAL LOW (ref 2.5–4.6)

## 2018-10-18 LAB — GLUCOSE, CAPILLARY
Glucose-Capillary: 361 mg/dL — ABNORMAL HIGH (ref 70–99)
Glucose-Capillary: 399 mg/dL — ABNORMAL HIGH (ref 70–99)
Glucose-Capillary: 366 mg/dL — ABNORMAL HIGH (ref 70–99)
Glucose-Capillary: 342 mg/dL — ABNORMAL HIGH (ref 70–99)
Glucose-Capillary: 329 mg/dL — ABNORMAL HIGH (ref 70–99)
Glucose-Capillary: 283 mg/dL — ABNORMAL HIGH (ref 70–99)

## 2018-10-18 LAB — MAGNESIUM: Magnesium: 2.1 mg/dL (ref 1.7–2.4)

## 2018-10-18 MED ORDER — TRAVASOL 10 % IV SOLN
INTRAVENOUS | Status: DC
  Administered 2018-10-18: 18:00:00 via INTRAVENOUS
  Filled 2018-10-18: qty 707.4

## 2018-10-18 MED ORDER — INSULIN GLARGINE 100 UNIT/ML ~~LOC~~ SOLN
20.0000 [IU] | Freq: Every day | SUBCUTANEOUS | Status: DC
  Administered 2018-10-18: 10:00:00 20 [IU] via SUBCUTANEOUS
  Filled 2018-10-18: qty 0.2

## 2018-10-18 MED ORDER — METRONIDAZOLE IN NACL 5-0.79 MG/ML-% IV SOLN
500.0000 mg | Freq: Three times a day (TID) | INTRAVENOUS | Status: DC
  Administered 2018-10-18 – 2018-10-26 (×24): 500 mg via INTRAVENOUS
  Filled 2018-10-18 (×24): qty 100

## 2018-10-18 MED ORDER — HEPARIN SODIUM (PORCINE) 5000 UNIT/ML IJ SOLN
5000.0000 [IU] | Freq: Three times a day (TID) | INTRAMUSCULAR | Status: DC
  Administered 2018-10-18 – 2018-10-26 (×24): 5000 [IU] via SUBCUTANEOUS
  Filled 2018-10-18 (×24): qty 1

## 2018-10-18 MED ORDER — POTASSIUM PHOSPHATES 15 MMOLE/5ML IV SOLN
20.0000 mmol | Freq: Once | INTRAVENOUS | Status: AC
  Administered 2018-10-18: 23:00:00 20 mmol via INTRAVENOUS
  Filled 2018-10-18: qty 6.67

## 2018-10-18 MED ORDER — POTASSIUM PHOSPHATES 15 MMOLE/5ML IV SOLN: 40.0000 meq | Freq: Once | INTRAVENOUS | Status: DC

## 2018-10-18 MED ORDER — SODIUM CHLORIDE 0.9 % IV SOLN
2.0000 g | Freq: Three times a day (TID) | INTRAVENOUS | Status: DC
  Administered 2018-10-18 – 2018-10-26 (×24): 2 g via INTRAVENOUS
  Filled 2018-10-18 (×28): qty 2

## 2018-10-18 MED ORDER — INSULIN ASPART 100 UNIT/ML ~~LOC~~ SOLN
5.0000 [IU] | Freq: Once | SUBCUTANEOUS | Status: AC
  Administered 2018-10-18: 09:00:00 5 [IU] via SUBCUTANEOUS

## 2018-10-18 MED ORDER — POTASSIUM PHOSPHATES 15 MMOLE/5ML IV SOLN
30.0000 mmol | Freq: Once | INTRAVENOUS | Status: AC
  Administered 2018-10-18: 13:00:00 30 mmol via INTRAVENOUS
  Filled 2018-10-18: qty 10

## 2018-10-18 MED ORDER — INSULIN GLARGINE 100 UNIT/ML ~~LOC~~ SOLN
30.0000 [IU] | Freq: Every day | SUBCUTANEOUS | Status: DC
  Administered 2018-10-19: 30 [IU] via SUBCUTANEOUS
  Filled 2018-10-18 (×2): qty 0.3

## 2018-10-18 MED ORDER — POTASSIUM CHLORIDE 10 MEQ/50ML IV SOLN
10.0000 meq | INTRAVENOUS | Status: DC
  Administered 2018-10-18 (×3): 10 meq via INTRAVENOUS
  Filled 2018-10-18: qty 50

## 2018-10-18 MED ORDER — CHLORHEXIDINE GLUCONATE CLOTH 2 % EX PADS
6.0000 | MEDICATED_PAD | Freq: Every day | CUTANEOUS | Status: DC
  Administered 2018-10-25: 6 via TOPICAL

## 2018-10-18 MED ORDER — INSULIN ASPART 100 UNIT/ML ~~LOC~~ SOLN
5.0000 [IU] | Freq: Once | SUBCUTANEOUS | Status: AC
  Administered 2018-10-18: 16:00:00 5 [IU] via SUBCUTANEOUS

## 2018-10-18 MED ORDER — SODIUM CHLORIDE 0.9 % IV SOLN: INTRAVENOUS | Status: DC | PRN

## 2018-10-18 MED ORDER — INSULIN GLARGINE 100 UNIT/ML ~~LOC~~ SOLN
10.0000 [IU] | Freq: Once | SUBCUTANEOUS | Status: AC
  Administered 2018-10-18: 16:00:00 10 [IU] via SUBCUTANEOUS
  Filled 2018-10-18: qty 0.1

## 2018-10-18 MED ORDER — CHLORHEXIDINE GLUCONATE CLOTH 2 % EX PADS: 6.0000 | MEDICATED_PAD | Freq: Every day | CUTANEOUS | Status: DC

## 2018-10-18 NOTE — Anesthesia Postprocedure Evaluation (Signed)
Anesthesia Post Note  Patient: Tristan Hughes  Procedure(s) Performed: RE-OPEN  LAPAROTOMY (N/A Abdomen)     Patient location during evaluation: NICU Anesthesia Type: General Level of consciousness: sedated and patient remains intubated per anesthesia plan Pain management: pain level controlled Vital Signs Assessment: post-procedure vital signs reviewed and stable Respiratory status: patient remains intubated per anesthesia plan and patient on ventilator - see flowsheet for VS Cardiovascular status: stable Postop Assessment: no apparent nausea or vomiting Anesthetic complications: no    Last Vitals:  Vitals:   10/18/18 1600 10/18/18 1615  BP: (!) 126/54 (!) 103/50  Pulse: 98 86  Resp: (!) 23 (!) 26  Temp:    SpO2: 97% 95%    Last Pain:  Vitals:   10/18/18 1552  TempSrc: Axillary  PainSc:                  Tristan Hughes

## 2018-10-18 NOTE — Progress Notes (Signed)
[facility] CONSULT NOTE   Pharmacy Consult for TPN Indication: post-gastrectomy  Patient Measurements: Height: 5\' 9"  (175.3 cm) Weight: 209 lb 3.5 oz (94.9 kg) IBW/kg (Calculated) : 70.7 TPN AdjBW (KG): 76.7 Body mass index is 30.9 kg/m. Usual Weight: ~220 lbs  Assessment:  78 yo M admitted on 6/2 for laparoscopy in the setting of known adenocarcinoma of the GE junction. S/p proximal gastrectomy with esophagogastrostomy, pyloroplasty, jejunostomy tube placement, distal pancreatectomy and splenectomy. Developed respiratory distress requiring intubation and a PEA arrest on 6/17. Repeat abdominal imaging 6/17 with concern for stomach necrosis. S/p complete gastrectomy and VAC change 6/18. Has been receiving tube feeds since 6/4 (stopped 6/18) and has moderate protein caloric malnutrition.   GI: TF 6/4>>6/18, albumin 1.8, prealbumin 5.3 Endo: prev requiring insulin glargine 40 daily + aspart ~30 units daily; glargine discontinued 6/17, reordered at 20 units daily 6/19 Insulin requirements in the past 24 hours: 69 units aspart on 6/18 Lytes: K 3.2 - receiving 10 mEq IV x 4, coCa 8.8, Na 145, Cl 107, phos <1; mag 2.1 Renal: SCr 1.34 (baseline ~ 0.8), UOP 0.7 ml/kg/hr. sodium bicarb gtt off 6/18 Pulm: intubated 6/17 for acute hypoxemic respiratory failure, possible aspiration, moderate ARDS  Cards: on norepi and vasopressin, s/p PEA arrest 6/17 Hepatobil: AST/ALT 54/36, tbili wnl Neuro: sedated on fentanyl and versed ID: abx for aspiration PNA and peritonitis. WBC 21.7, LA 8.4, low grade fevers   6/9 vancomycin > 6/9 zosyn > 6/19 Eraxis >  6/8 blood cx:ng-final 6/8 urine cx:ng-final 6/17 TA: few ecoli 6/17 blood: ngtd  TPN Access: central line placed 6/17 TPN start date: 6/18 Nutritional Goals (per RD recommendations on 6/18) KCal: 2438 kcal Protein: 135-175 g Fluid: >2,000 mL  Goal TPN rate is 95 ml/hr (provides 2,287 mL/day)  Current  Nutrition: tube feeds off 6/17, NPO, TPN initiated 6/18  Plan:  Increase TPN to 45 mL/hr (~50% of goal). Will start lipids in TPN on 6/19 per RD recommendations This TPN provides 71 g of protein, 135 g of dextrose, and 35 g of lipids which provides 1098 kCals per day, meeting 46% of kcal and 47% of protein requirements Electrolytes in TPN: standard electrolytes, chloride/acetate 1:1 Add MVI, trace elements (only MWF), and 0 units of regular insulin to TPN  Resistant SSI q4h ordered with insulin glargine 20 units daily per MD - adjust as needed No IVMF Give potassium phosphate IV 30 mmol x 1 Monitor TPN labs, CBGs, fluid status, repeat phos and BMET at [place], PharmD PGY1 Pharmacy Resident Phone [phone] 10/18/2018       10:39 AM

## 2018-10-18 NOTE — Progress Notes (Signed)
Pharmacy Antibiotic Note  Tristan Hughes is a 78 y.o. male admitted on 10/01/2018 with sepsis secondary to stomach necrosis, peritonitis and aspiration pneumonia.  Pharmacy has been consulted for cefepime dosing. Patient was on vancomycin, Zosyn, and Eraxis, and now transitioning to cefepime, metronidazole and Eraxis based on resp culture results.  CrCL ~50mL/min, sCr 1.34, WBC 21.7, Lac elevated.  Plan: Cefepime IV 2 g q8h  Metronidazole IV 500 mg q8h  F/u Cxs, clinical status, renal function, LOT/de-escalation Continue Eraxis IV 100 mg q24h   Height: 5\' 9"  (175.3 cm) Weight: 209 lb 3.5 oz (94.9 kg) IBW/kg (Calculated) : 70.7  Temp (24hrs), Avg:98.7 F (37.1 C), Min:97.8 F (36.6 C), Max:99.4 F (37.4 C)  Recent Labs  Lab 10/11/18 1633 10/11/18 2237  10/16/18 0934 10/16/18 1307 10/16/18 1528 10/16/18 1728 10/16/18 2048 10/16/18 2220 10/17/18 0027 10/17/18 0317 10/17/18 0321 10/17/18 0730 10/17/18 1400 10/18/18 0500  WBC  --   --    < > 17.8*  --  5.9  --  25.0*  --   --   --  35.4*  --   --  21.7*  CREATININE  --   --    < > 1.36*  --  1.75*  --   --   --  1.84*  --  1.86*  --  1.60* 1.34*  LATICACIDVEN  --   --    < > 5.7* 7.7*  --   --  8.9*  --   --  >11.0*  --  10.4* 8.4*  --   VANCOTROUGH 7*  --   --   --   --   --  12*  --   --   --   --   --   --   --   --   VANCOPEAK  --  19*  --   --   --   --   --   --  31  --   --   --   --   --   --    < > = values in this interval not displayed.    Estimated Creatinine Clearance: 52.5 mL/min (A) (by C-G formula based on SCr of 1.34 mg/dL (H)).    Allergies  Allergen Reactions  . Naproxen Sodium Swelling    SWELLING REACTION UNSPECIFIED     Antimicrobials this admission: Vancomycin 6/9 >> 6/18 zosyn 6/9 >> 6/19 Eraxis 6/19 >> Cefepime 6/19 >> Flagyl 6/19>>  Dose adjustments this admission:   Microbiology results: 6/17 Resp: E.coli (intermed: zosyn) 6/17 blood cx: ngtd 6/8 blood cx:ng-final 6/8 urine  cx:ng-final  Thank you for allowing pharmacy to be a part of this patient's care.  Harrietta Guardian, PharmD PGY1 Pharmacy Resident 10/18/2018    12:37 PM Please check AMION for all [place] numbers

## 2018-10-18 NOTE — Progress Notes (Signed)
[facility] ADULT ICU REPLACEMENT PROTOCOL FOR AM LAB REPLACEMENT ONLY  The patient does not apply for the [facility] Adult ICU Electrolyte Replacment Protocol based on the criteria listed below:   1. Is GFR >/= 40 ml/min? Yes.    Patient's GFR today is 51 2. Is urine output >/= 0.5 ml/kg/hr for the last 6 hours? Yes.   Patient's UOP is 1.07 ml/kg/hr 3. Is BUN < 60 mg/dL? Yes.    Patient's BUN today is 51 4. Abnormal electrolyte K 3.2 5. Ordered repletion with: per protocol 6. If a panic level lab has been reported, has the CCM MD in charge been notified? Yes.  .   Physician:  Illene Labrador, Canary Brim 10/18/2018 6:03 AM

## 2018-10-18 NOTE — Progress Notes (Signed)
1 Day Post-Op    CC: sepsis  Subjective: Pt improving overnight.  Pressors down more.  No fevers.    Objective: Vital signs in last 24 hours: Temp:  [97.8 F (36.6 C)-99.4 F (37.4 C)] 99.4 F (37.4 C) (06/19 0400) Pulse Rate:  [64-100] 70 (06/19 0600) Resp:  [18-34] 30 (06/19 0600) BP: (89-146)/(52-67) 132/60 (06/19 0600) SpO2:  [92 %-100 %] 99 % (06/19 0600) Arterial Line BP: (74-179)/(42-106) 84/67 (06/19 0200) FiO2 (%):  [50 %-100 %] 50 % (06/19 0330) Last BM Date: 10/15/18  Intake/Output from previous day: 06/18 0701 - 06/19 0700 In: 4120.7 [I.V.:3888.5; IV Piggyback:232.1] Out: 1885 [Urine:1500; Drains:375; Blood:10] Intake/Output this shift: No intake/output data recorded.  General appearance: intubated, sedated.   Resp: synchronous on vent.   Cardio: regular rate and rhythm. GI: skin wound vac in place Mediastinal and LUQ drain murky.  Right sided drains withoutout any output.   Extremities: +1 edema good distal pulses.  Lab Results:  Recent Labs    10/17/18 0321 10/18/18 0500  WBC 35.4* 21.7*  HGB 8.3* 7.2*  HCT 28.0* 23.3*  PLT 540* 421*    BMET Recent Labs    10/17/18 1400 10/18/18 0500  NA 145 145  K 4.1 3.2*  CL 110 107  CO2 20* 26  GLUCOSE 277* 433*  BUN 30* 33*  CREATININE 1.60* 1.34*  CALCIUM 7.1* 7.1*   PT/INR Recent Labs    10/16/18 1528  LABPROT 16.7*  INR 1.4*    Recent Labs  Lab 10/16/18 0934 10/16/18 1528 10/17/18 0027 10/17/18 0321  AST 29 53* 54* 54*  ALT 20 34 33 36  ALKPHOS 93 64 65 60  BILITOT 0.7 0.9 1.0 1.0  PROT 5.3* 5.0* 4.9* 4.7*  ALBUMIN 2.0* 1.7* 1.9* 1.8*     Lipase  No results found for: LIPASE   Medications: . chlorhexidine  15 mL Mouth Rinse BID  . chlorhexidine gluconate (MEDLINE KIT)  15 mL Mouth Rinse BID  . Chlorhexidine Gluconate Cloth  6 each Topical Daily  . Chlorhexidine Gluconate Cloth  6 each Topical Daily  . insulin aspart  0-20 Units Subcutaneous Q4H  . mouth rinse  15 mL  Mouth Rinse 10 times per day  . [START ON 10/19/2018] pantoprazole  40 mg Intravenous Q12H  . sodium chloride flush  10-40 mL Intracatheter Q12H  . vancomycin variable dose per unstable renal function (pharmacist dosing)   Does not apply See admin instructions   . sodium chloride Stopped (10/17/18 0933)  . anidulafungin 78 mL/hr at 10/18/18 0600  . fentaNYL infusion INTRAVENOUS 175 mcg/hr (10/18/18 0600)  . midazolam 3 mg/hr (10/18/18 0600)  . norepinephrine (LEVOPHED) Adult infusion 7 mcg/min (10/18/18 0600)  . pantoprozole (PROTONIX) infusion 8 mg/hr (10/17/18 2321)  . piperacillin-tazobactam (ZOSYN)  IV 3.375 g (10/18/18 0100)  . potassium chloride 10 mEq (10/18/18 0740)  .  sodium bicarbonate  infusion 1000 mL 100 mL/hr at 10/18/18 0600  . TPN ADULT (ION) 40 mL/hr at 10/18/18 0600  . 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 Stark Klein POD #15  -UGI 10/08/2018 shows mild posterior leak. -acute respiratory distress, intubated, coded x 2, placed on 3 pressors -takeback 6/17 with resection of necrotic stomach, open end of  distal esophagus, abthera vac -takeback 6/18 with washout and placement of drains, closure of fascia.    TNA Sepsis Broad spectrum antibiotics including antifungal.   DO NOT advance NGT   TENTATIVE PLAN to create esophageal conduit in around 6 weeks depending on recovery.        LOS: 17 days    Stark Klein 10/18/2018

## 2018-10-18 NOTE — Progress Notes (Signed)
[facility] DAILY ICU PROGRESS NOTE                   [address]            [place], [address]          [phone]   1 Day Post-Op Procedure(s) (LRB): RE-OPEN  LAPAROTOMY (N/A)  Total Length of Stay:  LOS: 17 days   Subjective: Intubated/sedated. Will move with touch but not command.   Objective: Vital signs in last 24 hours: Temp:  [97.8 F (36.6 C)-99.4 F (37.4 C)] 99.3 F (37.4 C) (06/19 0700) Pulse Rate:  [64-100] 81 (06/19 0829) Cardiac Rhythm: Normal sinus rhythm (06/19 0400) Resp:  [18-34] 34 (06/19 0829) BP: (94-157)/(53-67) 157/64 (06/19 0829) SpO2:  [92 %-100 %] 97 % (06/19 0829) Arterial Line BP: (74-179)/(45-106) 84/67 (06/19 0200) FiO2 (%):  [50 %-100 %] 50 % (06/19 0829)  Filed Weights   10/11/18 0500 10/12/18 0400 10/14/18 1957  Weight: 97.3 kg 98.1 kg 94.9 kg    Weight change:    Hemodynamic parameters for last 24 hours: CVP:  [6 mmHg-8 mmHg] 8 mmHg  Intake/Output from previous day: 06/18 0701 - 06/19 0700 In: 4120.7 [I.V.:3888.5; IV Piggyback:232.1] Out: 1885 [Urine:1500; Drains:375; Blood:10]  Intake/Output this shift: No intake/output data recorded.  Current Meds: Scheduled Meds: . chlorhexidine  15 mL Mouth Rinse BID  . chlorhexidine gluconate (MEDLINE KIT)  15 mL Mouth Rinse BID  . Chlorhexidine Gluconate Cloth  6 each Topical Daily  . Chlorhexidine Gluconate Cloth  6 each Topical Daily  . heparin injection (subcutaneous)  5,000 Units Subcutaneous Q8H  . insulin aspart  0-20 Units Subcutaneous Q4H  . insulin glargine  20 Units Subcutaneous Daily  . mouth rinse  15 mL Mouth Rinse 10 times per day  . [START ON 10/19/2018] pantoprazole  40 mg Intravenous Q12H  . sodium chloride flush  10-40 mL Intracatheter Q12H   Continuous Infusions: . sodium chloride Stopped (10/17/18 0933)  . anidulafungin 78 mL/hr at 10/18/18 0600  . fentaNYL infusion INTRAVENOUS 175 mcg/hr (10/18/18 0600)  . midazolam 3 mg/hr (10/18/18 0600)  .  norepinephrine (LEVOPHED) Adult infusion 7 mcg/min (10/18/18 0600)  . pantoprozole (PROTONIX) infusion 8 mg/hr (10/17/18 2321)  . piperacillin-tazobactam (ZOSYN)  IV 3.375 g (10/18/18 0100)  . potassium chloride 10 mEq (10/18/18 0910)  .  sodium bicarbonate  infusion 1000 mL 100 mL/hr at 10/18/18 0948  . TPN ADULT (ION) 40 mL/hr at 10/18/18 0600  . vasopressin (PITRESSIN) infusion - *FOR SHOCK* 0.03 Units/min (10/18/18 0950)   PRN Meds:.sodium chloride, acetaminophen, albuterol, fentaNYL, midazolam, sodium chloride flush  General appearance: intubated/sedated.  Heart: regular rate and rhythm, S1, S2 normal, no murmur, click, rub or gallop Lungs: clear to auscultation bilaterally Abdomen: diminished bowel sounds Extremities: 2-3+ pitting pedal edema  Lab Results: CBC: Recent Labs    10/17/18 0321 10/18/18 0500  WBC 35.4* 21.7*  HGB 8.3* 7.2*  HCT 28.0* 23.3*  PLT 540* 421*   BMET:  Recent Labs    10/17/18 1400 10/18/18 0500  NA 145 145  K 4.1 3.2*  CL 110 107  CO2 20* 26  GLUCOSE 277* 433*  BUN 30* 33*  CREATININE 1.60* 1.34*  CALCIUM 7.1* 7.1*    CMET: Lab Results  Component Value Date   WBC 21.7 (H) 10/18/2018   HGB 7.2 (L) 10/18/2018   HCT 23.3 (L) 10/18/2018   PLT 421 (H) 10/18/2018   GLUCOSE 433 (H) 10/18/2018  CHOL 104 11/16/2017   TRIG 60 10/17/2018   HDL 39.60 11/16/2017   LDLCALC 45 11/16/2017   ALT 36 10/17/2018   AST 54 (H) 10/17/2018   NA 145 10/18/2018   K 3.2 (L) 10/18/2018   CL 107 10/18/2018   CREATININE 1.34 (H) 10/18/2018   BUN 33 (H) 10/18/2018   CO2 26 10/18/2018   PSA 1.36 08/03/2009   INR 1.4 (H) 10/16/2018   HGBA1C 5.8 (H) 09/25/2018   MICROALBUR 1.8 07/09/2009      PT/INR:  Recent Labs    10/16/18 1528  LABPROT 16.7*  INR 1.4*   Radiology: Dg Chest Port 1 View  Result Date: 10/18/2018 CLINICAL DATA:  Acute respiratory failure. EXAM: PORTABLE CHEST 1 VIEW COMPARISON:  10/17/2018. FINDINGS: NG tube tip again noted  above the GE junction. Endotracheal tube in stable position. Left IJ line in stable position. PowerPort catheter stable position. Prior median sternotomy. Heart size stable. Persistent bibasilar infiltrates and small bilateral pleural effusions. No change. No pneumothorax. Old right rib fracture. IMPRESSION: 1. NG tube tip again noted above the GE junction. Further advancement should be considered. Remaining lines and tubes in stable position. 2. Persistent bibasilar pulmonary infiltrates and bilateral pleural effusions. No interim change. 3.  Prior median sternotomy.  Stable cardiomegaly. Electronically Signed   By: Marcello Moores  Register   On: 10/18/2018 07:46     Assessment/Plan: S/P Procedure(s) (LRB): RE-OPEN  LAPAROTOMY (N/A)  Adenocarcinoma of the gastric cardia, pancreatic neuroendocrine tumor S/p diagnostic laparoscopy, proximal gastrectomy with esophago-gastrostomy, pyloroplasty, jejunostomy tube placement, intraoperative ultrasound, distal pancreatectomy, splenectomy 10/01/2018, Dr. Stark Klein POD #15  -UGI 10/08/2018 shows mild posterior leak. -acute respiratory distress, intubated, coded x 2, placed on 3 pressors -takeback 6/17 with resection of necrotic stomach, open end of distal esophagus, abthera vac -takeback 6/18 with washout and placement of drains, closure of fascia.    1. Shock-secondary to stomach necrosis, peritonitis and aspiration PNA. Continuing to wean pressor support as tolerated. Continue broad spectrum abx and antifungal 2. PEA arrest on 6/17 x 2 3. Acute respiratory failure- possibly due to aspiration. Intubated and sedated.  4. Lactic acidosis- care per CCM.   Hopefully back to OR to create an esophageal conduit in 6 weeks per Dr. Barry Dienes depending on the patient's recovery/progress.     Elgie Collard 10/18/2018 10:00 AM

## 2018-10-18 NOTE — Progress Notes (Signed)
Inpatient Diabetes Program Recommendations  AACE/ADA: New Consensus Statement on Inpatient Glycemic Control (2015)  Target Ranges:  Prepandial:   less than 140 mg/dL      Peak postprandial:   less than 180 mg/dL (1-2 hours)      Critically ill patients:  140 - 180 mg/dL   Lab Results  Component Value Date   GLUCAP 366 (H) 10/18/2018   HGBA1C 5.8 (H) 09/25/2018    Review of Glycemic Control Results for ARIAS, WEINERT "Tristan Hughes" (MRN [id number]) as of 10/18/2018 12:20  Ref. Range 10/17/2018 23:09 10/18/2018 03:07 10/18/2018 07:27 10/18/2018 11:10  Glucose-Capillary Latest Ref Range: 70 - 99 mg/dL 355 (H) 361 (H) 399 (H) 366 (H)   Diabetes history: Type 2 DM Outpatient Diabetes medications: Humalog 11-23 units TID, Lantus 46 units QHS Current orders for Inpatient glycemic control: Lantus 20 units QD, Novolog 0-20 units Q4H, Novolog 5 units x1 Decadron 5 mg x1 on 6/17  Inpatient Diabetes Program Recommendations:   TPN @ 40 ml/hr initiated on 6/18 @ 1706 TPN @ 45 ml/hr to start at 1800  Noted Decadron administration, thus glucose yesterday were slightly increased to 190-200's mg/dL. At time of TPN initiation, glucose trends increased to now at high as 350-400's mg/dL.  Lantus started this AM.  TPN at this time does not contain insulin. Discussed via secure chat with pharmacist and changes consist of adding Lantus, decreasing total dextrose and adding lipids to solution. Additionally, reached out to [place], NP. Discussed insulin changes made, insulin received including Lantus dose, discussion with pharmacy and to consider adding insulin to TPN today. Continue with plan to reevaluate in AM.    Thanks, Bronson Curb, MSN, RNC-OB Diabetes Coordinator [phone] (8a-5p)

## 2018-10-18 NOTE — Progress Notes (Addendum)
NAME:  Tristan Hughes, MRN:  [id number], DOB:  Apr 01, 1941, LOS: 31 ADMISSION DATE:  10/01/2018, CONSULTATION DATE:  10/16/18 REFERRING MD:  Dr. Barry Dienes , CHIEF COMPLAINT:  Respiratory Distress   Brief History   78 y/o M admitted 6/2 for planned laparoscopy in setting of known adenocarcinoma of the GE junction.  He is s/p proximal gastrectomy with esophagogastrostomy, pyloroplasty, jejunostomy tube placement, distal pancreatectomy and splenectomy.  There were concerns for possible leak post operatively.  CT of the ABD 6/12 raised concern for gas/fluid in the LUQ.  He developed respiratory distress 6/17 and was transferred to ICU.  Post intubation suffered PEA arrest requiring ~ 8 minutes of CPR before ROSC. Repeat ABD imaging 6/17 with concerns for stomach necrosis. He returned to the OR for completion gastrectomy. Returned 6/18 for VAC change, exploration & multiple drain placement.   Past Medical History  OSA  CAD HTN HLD GERD Gastric Cancer - identified 03/2018, s/p three cycles of FOLFOX DM II  Depression  Anxiety   Significant Hospital Events   6/02 Admit  6/17 PCCM consulted for resp distress, PEA arrest post intubation x2.  To OR for gastrectomy 6/18 Off epi gtt.  To OR for VAC change, exploration and drain placement  Consults:  PCCM 6/17   Procedures:  ETT 6/17 >> L IJ TLC 6/17 >>  L Radial Aline 6/17 >>   Significant Diagnostic Tests:  CT Abd w/o 6/12 >> surgical changes of proximal gastrectomy with primary anastomosis, with no evidence of enteric contrast leak/anastomic leak or stomach wall disruption after PO contrast. Surgical changes of distal pancreatectomy & splenectomy with persisting flocculent gas/fluid in the LUQ (DDx infection, with perforation / disruption at the surgical site or stomach not likely given the absence of extraluminal contrast) CT Chest / ABD / Pelvis 6/17 >> dense bibasilar consolidation, nodular airspace disease in RML, bilateral upper lobes,  large well-circumscribed flocculent gas collection in the LUQ with concern for rupture of the stomach and necrosis / abscess. Fluid & gas in the distal esophagus which extends high into the esophagus.  Progression of subcutaneous gas in the left chest wall & right chest wall  Micro Data:  BCx2 6/8 >> negative  UC 6/8 >> negative  Tracheal aspirate 6/17 >> few GNR >>  BCx2 6/17 >>   Antimicrobials:  Ancef 6/2 x1 (OR on call) Vanco 6/9 >> 6/19 Zosyn 6/9 >> 6/19 Anidulafungin 6/18 >>  Cefepime 6/19 >>  Interim history/subjective:  RN reports hyperglycemia.  Vasopressor need decreased. TPN infusing.  Objective   Blood pressure 132/60, pulse 70, temperature 99.4 F (37.4 C), temperature source Oral, resp. rate (!) 30, height 5\' 9"  (1.753 m), weight 94.9 kg, SpO2 99 %. CVP:  [6 mmHg-11 mmHg] 8 mmHg  Vent Mode: PRVC FiO2 (%):  [50 %-100 %] 50 % Set Rate:  [34 bmp] 34 bmp Vt Set:  [560 mL] 560 mL PEEP:  [10 IHK74-25 cmH20] 10 cmH20 Plateau Pressure:  [26 cmH20-30 cmH20] 26 cmH20   Intake/Output Summary (Last 24 hours) at 10/18/2018 0758 Last data filed at 10/18/2018 0600 Gross per 24 hour  Intake 4120.67 ml  Output 1885 ml  Net 2235.67 ml   Filed Weights   10/11/18 0500 10/12/18 0400 10/14/18 1957  Weight: 97.3 kg 98.1 kg 94.9 kg    Examination: General: critically ill appearing male lying in bed on vent in NAD HEENT: MM pink/moist, ETT, L IJ clean / dry / intact Neuro: sedate, pupils 64mm  CV: s1s2  rrr, no m/r/g PULM: even/non-labored, lungs bilaterally clear  KD:XIPJ, non-tender, bsx4 active  Extremities: warm/dry, generalized 2+ pitting edema  Skin: no rashes or lesions  Resolved Hospital Problem list      Assessment & Plan:   Distributive Shock  -secondary to stomach necrosis, peritonitis and aspiration PNA P: Wean pressors for MAP >65 Continue vasopressin until levophed weaned off  Change to cefepime based on sensitivities  Stop vancomycin (D10)  PEA Arrest  -approx 8 minutes CPR before ROSC 6/17 -bloody secretions pooling at posterior pharynx on intubation  P: ICU monitoring  Vasopressors as above  Acute Hypoxemic Respiratory Failure  -in setting of arrest, possible aspiration  ARDS -PF ratio 134, compatible with moderate ARDS 6/18 Hx OSA  -does not use CPAP P: PRVC 8cc/kg, rate 34 Follow intermittent ABG May need to replace aline 6/19 given poor wave form No WUA 6/19 due to hemodynamic instability  Follow CXR  Adenocarcinoma of GE Junction s/p Laparoscopy with Gastrectomy -proximal gastrectomy with esophagogastrostomy, pyloroplasty, jejunostomy tube placement, distal pancreatectomy and splenectomy 6/2.   -completed 3 cycles of FOLFOX -concern for LUQ gas/fluid on 6/12 CT ABD > back to OR for gastrectomy -abd closed 6/18 P: Continue TPN  NPO  DO NOT MANIPULATE NGT Continue PPI  Post-operative, wound care per CCS  AKI AGMA / Lactic Acidosis  Hypokalemia P: Stop Bicarb gtt (CO2 26) Trend BMP / urinary output Replace electrolytes as indicated, KCL 6/19  Avoid nephrotoxic agents, ensure adequate renal perfusion  Thrombocytosis  -suspect reactive  P: Follow trend, improving  Resume heparin for DVT prophylaxis   Moderate Protein Calorie Malnutrition  P: TPN  No TF per CCS  HTN HLD CAD s/p CABG P: Hold home agents  ICU monitoring   DM with Hyperglycemia  P: SSI, resistant scale  Add lantus 8 units   Best practice:  Diet: NPO  Pain/Anxiety/Delirium protocol (if indicated): Versed, Fentanyl  VAP protocol (if indicated): In place  DVT prophylaxis: SCD's, heparin restarted 6/19  GI prophylaxis: PPI   Glucose control: SSI  Mobility: BR, advance as tolerated  Code Status: Full Code  Family Communication: Per primary SVC 6/19 Disposition: ICU   Labs   CBC: Recent Labs  Lab 10/16/18 0934  10/16/18 1528 10/16/18 1803 10/16/18 2048 10/17/18 0321 10/18/18 0500  WBC 17.8*  --  5.9  --  25.0* 35.4* 21.7*   NEUTROABS  --   --   --   --  23.3* 32.9*  --   HGB 7.8*   < > 8.9* 9.5* 8.6* 8.3* 7.2*  HCT 26.7*   < > 29.1* 28.0* 28.9* 28.0* 23.3*  MCV 94.7  --  92.4  --  94.8 95.2 89.3  PLT 698*  --  595*  --  568* 540* 421*   < > = values in this interval not displayed.    Basic Metabolic Panel: Recent Labs  Lab 10/16/18 1528 10/16/18 1803 10/17/18 0027 10/17/18 0321 10/17/18 1400 10/18/18 0500  NA 145 149* 145 145 145 145  K 4.0 4.1 4.3 4.5 4.1 3.2*  CL 112*  --  113* 112* 110 107  CO2 19*  --  16* 14* 20* 26  GLUCOSE 123*  --  120* 193* 277* 433*  BUN 22  --  24* 26* 30* 33*  CREATININE 1.75*  --  1.84* 1.86* 1.60* 1.34*  CALCIUM 7.1*  --  7.0* 7.1* 7.1* 7.1*  MG  --   --   --  2.0  --   --  PHOS  --   --   --  5.7*  --   --    GFR: Estimated Creatinine Clearance: 52.5 mL/min (A) (by C-G formula based on SCr of 1.34 mg/dL (H)). Recent Labs  Lab 10/16/18 1528 10/16/18 2048 10/17/18 0317 10/17/18 0321 10/17/18 0730 10/17/18 1400 10/18/18 0500  WBC 5.9 25.0*  --  35.4*  --   --  21.7*  LATICACIDVEN  --  8.9* >11.0*  --  10.4* 8.4*  --     Liver Function Tests: Recent Labs  Lab 10/16/18 0934 10/16/18 1528 10/17/18 0027 10/17/18 0321  AST 29 53* 54* 54*  ALT 20 34 33 36  ALKPHOS 93 64 65 60  BILITOT 0.7 0.9 1.0 1.0  PROT 5.3* 5.0* 4.9* 4.7*  ALBUMIN 2.0* 1.7* 1.9* 1.8*   No results for input(s): LIPASE, AMYLASE in the last 168 hours. No results for input(s): AMMONIA in the last 168 hours.  ABG    Component Value Date/Time   PHART 7.364 10/17/2018 0846   PCO2ART 33.2 10/17/2018 0846   PO2ART 71.0 (L) 10/17/2018 0846   HCO3 18.5 (L) 10/17/2018 0846   TCO2 24 10/16/2018 1803   ACIDBASEDEF 5.9 (H) 10/17/2018 0846   O2SAT 93.7 10/17/2018 0846     Coagulation Profile: Recent Labs  Lab 10/16/18 1528  INR 1.4*    Cardiac Enzymes: Recent Labs  Lab 10/16/18 0934 10/16/18 1528 10/16/18 2128  TROPONINI 0.28* 0.64* 0.38*    HbA1C: Hemoglobin A1C   Date/Time Value Ref Range Status  05/20/2018 09:04 AM 5.9 (A) 4.0 - 5.6 % Final  11/16/2017 08:56 AM 6.5 (A) 4.0 - 5.6 % Final   Hgb A1c MFr Bld  Date/Time Value Ref Range Status  09/25/2018 11:18 AM 5.8 (H) 4.8 - 5.6 % Final    Comment:    (NOTE) Pre diabetes:          5.7%-6.4% Diabetes:              >6.4% Glycemic control for   <7.0% adults with diabetes   10/22/2015 11:26 AM 6.3 4.6 - 6.5 % Final    Comment:    Glycemic Control Guidelines for People with Diabetes:Non Diabetic:  <6%Goal of Therapy: <7%Additional Action Suggested:  >8%     CBG: Recent Labs  Lab 10/17/18 1642 10/17/18 1912 10/17/18 2309 10/18/18 0307 10/18/18 0727  GLUCAP 236* 264* 355* 361* 399*    Critical care time: 35 minutes      [name], NP-C [facility] Pgr: [phone] or if no answer [phone] 10/18/2018, 7:58 AM

## 2018-10-19 ENCOUNTER — Inpatient Hospital Stay (HOSPITAL_COMMUNITY): Payer: Medicare Other

## 2018-10-19 LAB — POCT I-STAT 7, (LYTES, BLD GAS, ICA,H+H)
Acid-Base Excess: 4 mmol/L — ABNORMAL HIGH (ref 0.0–2.0)
Bicarbonate: 28.3 mmol/L — ABNORMAL HIGH (ref 20.0–28.0)
Calcium, Ion: 1.13 mmol/L — ABNORMAL LOW (ref 1.15–1.40)
HCT: 23 % — ABNORMAL LOW (ref 39.0–52.0)
Hemoglobin: 7.8 g/dL — ABNORMAL LOW (ref 13.0–17.0)
O2 Saturation: 94 %
Patient temperature: 99.1
Potassium: 3.5 mmol/L (ref 3.5–5.1)
Sodium: 147 mmol/L — ABNORMAL HIGH (ref 135–145)
TCO2: 29 mmol/L (ref 22–32)
pCO2 arterial: 38.8 mmHg (ref 32.0–48.0)
pH, Arterial: 7.472 — ABNORMAL HIGH (ref 7.350–7.450)
pO2, Arterial: 66 mmHg — ABNORMAL LOW (ref 83.0–108.0)

## 2018-10-19 LAB — GLUCOSE, CAPILLARY
Glucose-Capillary: 281 mg/dL — ABNORMAL HIGH (ref 70–99)
Glucose-Capillary: 236 mg/dL — ABNORMAL HIGH (ref 70–99)
Glucose-Capillary: 230 mg/dL — ABNORMAL HIGH (ref 70–99)
Glucose-Capillary: 231 mg/dL — ABNORMAL HIGH (ref 70–99)
Glucose-Capillary: 241 mg/dL — ABNORMAL HIGH (ref 70–99)
Glucose-Capillary: 212 mg/dL — ABNORMAL HIGH (ref 70–99)

## 2018-10-19 LAB — CBC
HCT: 23.1 % — ABNORMAL LOW (ref 39.0–52.0)
Hemoglobin: 7.2 g/dL — ABNORMAL LOW (ref 13.0–17.0)
MCH: 28 pg (ref 26.0–34.0)
MCHC: 31.2 g/dL (ref 30.0–36.0)
MCV: 89.9 fL (ref 80.0–100.0)
Platelets: 321 10*3/uL (ref 150–400)
RBC: 2.57 MIL/uL — ABNORMAL LOW (ref 4.22–5.81)
RDW: 16.2 % — ABNORMAL HIGH (ref 11.5–15.5)
WBC: 24.1 10*3/uL — ABNORMAL HIGH (ref 4.0–10.5)
nRBC: 1.9 % — ABNORMAL HIGH (ref 0.0–0.2)

## 2018-10-19 LAB — BASIC METABOLIC PANEL
Anion gap: 9 (ref 5–15)
Anion gap: 8 (ref 5–15)
BUN: 31 mg/dL — ABNORMAL HIGH (ref 8–23)
BUN: 31 mg/dL — ABNORMAL HIGH (ref 8–23)
CO2: 27 mmol/L (ref 22–32)
CO2: 28 mmol/L (ref 22–32)
Calcium: 7.3 mg/dL — ABNORMAL LOW (ref 8.9–10.3)
Calcium: 7.4 mg/dL — ABNORMAL LOW (ref 8.9–10.3)
Chloride: 109 mmol/L (ref 98–111)
Chloride: 112 mmol/L — ABNORMAL HIGH (ref 98–111)
Creatinine, Ser: 1.04 mg/dL (ref 0.61–1.24)
Creatinine, Ser: 1.03 mg/dL (ref 0.61–1.24)
GFR calc Af Amer: >60 mL/min (ref >60)
GFR calc Af Amer: >60 mL/min (ref >60)
GFR calc non Af Amer: >60 mL/min (ref >60)
GFR calc non Af Amer: >60 mL/min (ref >60)
Glucose, Bld: 287 mg/dL — ABNORMAL HIGH (ref 70–99)
Glucose, Bld: 247 mg/dL — ABNORMAL HIGH (ref 70–99)
Potassium: 3.5 mmol/L (ref 3.5–5.1)
Potassium: 3.9 mmol/L (ref 3.5–5.1)
Sodium: 145 mmol/L (ref 135–145)
Sodium: 148 mmol/L — ABNORMAL HIGH (ref 135–145)

## 2018-10-19 LAB — PHOSPHORUS
Phosphorus: 2 mg/dL — ABNORMAL LOW (ref 2.5–4.6)
Phosphorus: 2.4 mg/dL — ABNORMAL LOW (ref 2.5–4.6)

## 2018-10-19 LAB — MAGNESIUM: Magnesium: 2.1 mg/dL (ref 1.7–2.4)

## 2018-10-19 MED ORDER — POTASSIUM PHOSPHATES 15 MMOLE/5ML IV SOLN
10.0000 mmol | Freq: Once | INTRAVENOUS | Status: AC
  Administered 2018-10-19: 10 mmol via INTRAVENOUS
  Filled 2018-10-19: qty 3.33

## 2018-10-19 MED ORDER — FENTANYL 2500MCG IN NS 250ML (10MCG/ML) PREMIX INFUSION
25.0000 ug/h | INTRAVENOUS | Status: DC
  Administered 2018-10-19: 14:00:00 75 ug/h via INTRAVENOUS
  Administered 2018-10-20: 22:00:00 100 ug/h via INTRAVENOUS
  Administered 2018-10-20: 10:00:00 200 ug/h via INTRAVENOUS
  Administered 2018-10-21: 10:00:00 150 ug/h via INTRAVENOUS
  Administered 2018-10-22: 25 ug/h via INTRAVENOUS
  Administered 2018-10-23: 100 ug/h via INTRAVENOUS
  Administered 2018-10-24: 18:00:00 50 ug/h via INTRAVENOUS
  Filled 2018-10-19 (×8): qty 250

## 2018-10-19 MED ORDER — POTASSIUM PHOSPHATES 15 MMOLE/5ML IV SOLN
20.0000 mmol | Freq: Once | INTRAVENOUS | Status: AC
  Administered 2018-10-19: 10:00:00 20 mmol via INTRAVENOUS
  Filled 2018-10-19: qty 6.67

## 2018-10-19 MED ORDER — MIDAZOLAM HCL 2 MG/2ML IJ SOLN
1.0000 mg | INTRAMUSCULAR | Status: DC | PRN
  Administered 2018-10-20 – 2018-10-26 (×6): 1 mg via INTRAVENOUS
  Filled 2018-10-19 (×6): qty 2

## 2018-10-19 MED ORDER — FENTANYL BOLUS VIA INFUSION
25.0000 ug | INTRAVENOUS | Status: DC | PRN
  Administered 2018-10-21: 25 ug via INTRAVENOUS
  Filled 2018-10-19: qty 25

## 2018-10-19 MED ORDER — CHLORHEXIDINE GLUCONATE CLOTH 2 % EX PADS
6.0000 | MEDICATED_PAD | Freq: Every day | CUTANEOUS | Status: DC
  Administered 2018-10-19 – 2018-10-24 (×6): 6 via TOPICAL

## 2018-10-19 MED ORDER — FUROSEMIDE 10 MG/ML IJ SOLN
40.0000 mg | Freq: Once | INTRAMUSCULAR | Status: AC
  Administered 2018-10-19: 08:00:00 40 mg via INTRAVENOUS
  Filled 2018-10-19: qty 4

## 2018-10-19 MED ORDER — STERILE WATER FOR INJECTION IV SOLN
INTRAVENOUS | Status: AC
  Administered 2018-10-19: 18:00:00 via INTRAVENOUS
  Filled 2018-10-19: qty 774.4

## 2018-10-19 NOTE — Progress Notes (Signed)
[facility] CONSULT NOTE   Pharmacy Consult for TPN Indication: post-gastrectomy  Patient Measurements: Height: 5\' 9"  (175.3 cm) Weight: 209 lb 3.5 oz (94.9 kg) IBW/kg (Calculated) : 70.7 TPN AdjBW (KG): 76.7 Body mass index is 30.9 kg/m. Usual Weight: ~220 lbs  Assessment:  78 yo M admitted on 6/2 for laparoscopy in the setting of known adenocarcinoma of the GE junction. S/p proximal gastrectomy with esophagogastrostomy, pyloroplasty, jejunostomy tube placement, distal pancreatectomy and splenectomy. Developed respiratory distress requiring intubation and a PEA arrest on 6/17. Repeat abdominal imaging 6/17 with concern for stomach necrosis. S/p complete gastrectomy and VAC change 6/18. Has been receiving tube feeds since 6/4 (stopped 6/18) and has moderate protein caloric malnutrition.   GI: TF 6/4>>6/18, albumin 1.8, prealbumin 5.3 Endo: Hx DM and now s/p distal pancreatectomy. Prior to admission on Lantus 40 daily + Novolog ~30 units daily; Decadron 5mg  x1 on 6/17. Lantus restarted 6/19. CBGs 280s this AM -trending down overall with addition of Lantus.  Insulin requirements in the past 24 hours: 102 units aspart on 6/19. Lytes: K up 3.5, coCa 9.1, Na 145, Cl 109, phos up to 2 with replacement (prior phos of 5.7 may have been error); mag stable 2.1 Renal: AKI resolving. SCr down 1.04 (baseline ~ 0.8), UOP 1.3 ml/kg/hr. BUNT down to 31. Pulm: intubated 6/17 for acute hypoxemic respiratory failure, possible aspiration, moderate ARDS. Giving Lasix 40mg  IV x1 today.  Cards: s/p PEA arrest on 6/17. MAP 71 (using cuff as aline not working) and off pressors this AM. Hepatobil: AST/ALT 54/36, tbili wnl Neuro: sedated on fentanyl at 100 and versed at 2. RASS -3.  ID: abx for aspiration PNA and peritonitis- Ecoli intermediate to Zosyn>> changed to Cefepime + Flagyl and continuing Eraxis. WBC 24.1, LA 8.4, Tm 99  TPN Access: central line placed 6/17 TPN start date:  6/18 Nutritional Goals (per RD recommendations on 6/18) KCal: 2438 kcal Protein: 135-175 g Fluid: >2,000 mL  Goal TPN rate is 75 ml/hr (provides 1800 mL/day) - this provides 158 g protein, 270 g dextrose, and 73.8 g lipids meeting 100% of protein needs and ~95% of kcal needs based on current goals.  *Allowing lower kcal to help reduce dextrose amount with uncontrolled CBGs and concentrated to reduce volume in the setting of ARDS.   Current Nutrition: tube feeds off 6/17, NPO, TPN initiated 6/18  Plan:  Increase TPN to 55 mL/hr (slowly increasing to help prevent refeeding).  This TPN provides 116 g of protein, 198 g of dextrose, and  54 g of lipids which provides 1678 kCals per day, meeting ~75% of requirements Electrolytes in TPN: slightly increase phos, others standard, chloride/acetate 1:1  Add MVI to TPN Trace elements to be added only MWF due to national shortage Resistant SSI q4h ordered with insulin glargine increased to 30 units daily per MD - adjust as needed. Discussed with Dr. Halford Chessman on 6/20- safer to do insulin either all in bag or all out of bag; will do all out of bag for now.   Give potassium phosphate IV 20 mmol x 1 Repeat BMET and Phos at 1800 Monitor TPN labs, CBGs, fluid status  Sloan Leiter, PharmD, BCPS, BCCCP Clinical Pharmacist Please refer to [facility] for [place] numbers 10/19/2018       7:06 AM

## 2018-10-19 NOTE — Progress Notes (Addendum)
NAME:  Tristan Hughes, MRN:  [id number], DOB:  15-Aug-1940, LOS: 61 ADMISSION DATE:  10/01/2018, CONSULTATION DATE:  10/16/18 REFERRING MD:  Dr. Barry Dienes , CHIEF COMPLAINT:  Respiratory Distress   Brief History   78 y/o M admitted 6/2 for planned laparoscopy in setting of known adenocarcinoma of the GE junction.  He is s/p proximal gastrectomy with esophagogastrostomy, pyloroplasty, jejunostomy tube placement, distal pancreatectomy and splenectomy.  There were concerns for possible leak post operatively.  CT of the ABD 6/12 raised concern for gas/fluid in the LUQ.  He developed respiratory distress 6/17 and was transferred to ICU.  Post intubation suffered PEA arrest requiring ~ 8 minutes of CPR before ROSC. Repeat ABD imaging 6/17 with concerns for stomach necrosis. He returned to the OR for completion gastrectomy. Returned 6/18 for VAC change, exploration & multiple drain placement.   Past Medical History  OSA  CAD HTN HLD GERD Gastric Cancer - identified 03/2018, s/p three cycles of FOLFOX DM II  Depression  Anxiety   Significant Hospital Events   6/02 Admit  6/17 PCCM consulted for resp distress, PEA arrest post intubation x2.  To OR for gastrectomy 6/18 Off epi gtt.  To OR for VAC change, exploration and drain placement  Consults:  TCTS 6/19   Procedures:  ETT 6/17 >> L IJ TLC 6/17 >>  L Radial Aline 6/17 >> 6/20  Significant Diagnostic Tests:  CT Abd w/o 6/12 >> surgical changes of proximal gastrectomy with primary anastomosis, with no evidence of enteric contrast leak/anastomic leak or stomach wall disruption after PO contrast. Surgical changes of distal pancreatectomy & splenectomy with persisting flocculent gas/fluid in the LUQ (DDx infection, with perforation / disruption at the surgical site or stomach not likely given the absence of extraluminal contrast) CT Chest / ABD / Pelvis 6/17 >> dense bibasilar consolidation, nodular airspace disease in RML, bilateral upper lobes,  large well-circumscribed flocculent gas collection in the LUQ with concern for rupture of the stomach and necrosis / abscess. Fluid & gas in the distal esophagus which extends high into the esophagus.  Progression of subcutaneous gas in the left chest wall & right chest wall  Micro Data:  BCx2 6/8 >> negative  UC 6/8 >> negative  Tracheal aspirate 6/17 >> E coli BCx2 6/17 >>   Antimicrobials:  Anidulafungin 6/18 >>  Cefepime 6/19 >>  Interim history/subjective:  Remains on vent support, O2 and PEEP increased overnight.  Objective   Blood pressure (!) 123/58, pulse 85, temperature 99.1 F (37.3 C), temperature source Oral, resp. rate (!) 23, height 5\' 9"  (1.753 m), weight 94.9 kg, SpO2 99 %. CVP:  [8 mmHg-10 mmHg] 8 mmHg  Vent Mode: PRVC FiO2 (%):  [40 %-60 %] 60 % Set Rate:  [20 bmp-34 bmp] 20 bmp Vt Set:  [560 mL] 560 mL PEEP:  [5 cmH20-10 cmH20] 8 cmH20 Plateau Pressure:  [17 cmH20-28 cmH20] 17 cmH20   Intake/Output Summary (Last 24 hours) at 10/19/2018 0730 Last data filed at 10/19/2018 0600 Gross per 24 hour  Intake 3894.06 ml  Output 3655 ml  Net 239.06 ml   Filed Weights   10/11/18 0500 10/12/18 0400 10/14/18 1957  Weight: 97.3 kg 98.1 kg 94.9 kg    Examination:  General - sedated Eyes - pupils pinpoint and reactive ENT - ETT in place Cardiac - regular rate/rhythm, no murmur Chest - b/l crackles Abdomen - wound vac, drains in place Extremities - 2+ edema Skin - no rashes Neuro - RASS -3  GU - scrotal edema  Resolved Hospital Problem list   PEA arrest, AKI, Lactic acidosis, Thrombocytosis  Assessment & Plan:   Septic shock from necrotic stomach with peritonitis and aspiration pneumonia with E coli. Plan - day 12/14 of Abx, currently on cefepime, flagyl - day 4 of anidulafungin - d/c arterial line 6/20 - likely can d/c vasopressin 6/20  Acute hypoxic respiratory failure with ARDS from aspiration pneumonia. Plan: - full vent support - Vt at 8 cc/kg  due to acidosis - goal SpO2 88 to 95% - lasix 40 mg IV x one 6/20  Adenocarcinoma of GE Junction s/p Laparoscopy with Gastrectomy. Discussion: -proximal gastrectomy with esophagogastrostomy, pyloroplasty, jejunostomy tube placement, distal pancreatectomy and splenectomy 6/2.   -completed 3 cycles of FOLFOX -concern for LUQ gas/fluid on 6/12 CT ABD > back to OR for gastrectomy -abd closed 6/18 Plan - post op care per surgery - TCTS consulted - do not manipulate NG tube - continue protonix  Acute metabolic encephalopathy. Plan - RASS goal -1 to -2  Hypophosphatemia, hypokalemia. Plan - replace as needed  Moderate Protein Calorie Malnutrition  Plan - continue TPN  Anemia of critical illness. Plan - f/u CBC - transfuse for Hb < 7  HTN, HLD, CAD s/p CABG. P: - hold outpt meds  DM with Hyperglycemia  Plan - SSI with lantus  Best practice:  Diet: NPO  DVT prophylaxis: SQ heparin GI prophylaxis: Protonix Mobility: bed rest Code Status: Full Code  Disposition: ICU   Labs    CMP Latest Ref Rng & Units 10/19/2018 10/19/2018 10/18/2018  Glucose 70 - 99 mg/dL 287(H) - 349(H)  BUN 8 - 23 mg/dL 31(H) - 32(H)  Creatinine 0.61 - 1.24 mg/dL 1.04 - 1.09  Sodium 135 - 145 mmol/L 145 147(H) 147(H)  Potassium 3.5 - 5.1 mmol/L 3.5 3.5 3.3(L)  Chloride 98 - 111 mmol/L 109 - 112(H)  CO2 22 - 32 mmol/L 27 - 28  Calcium 8.9 - 10.3 mg/dL 7.3(L) - 7.1(L)  Total Protein 6.5 - 8.1 g/dL - - -  Total Bilirubin 0.3 - 1.2 mg/dL - - -  Alkaline Phos 38 - 126 U/L - - -  AST 15 - 41 U/L - - -  ALT 0 - 44 U/L - - -   CBC Latest Ref Rng & Units 10/19/2018 10/19/2018 10/18/2018  WBC 4.0 - 10.5 K/uL 24.1(H) - -  Hemoglobin 13.0 - 17.0 g/dL 7.2(L) 7.8(L) 7.8(L)  Hematocrit 39.0 - 52.0 % 23.1(L) 23.0(L) 23.0(L)  Platelets 150 - 400 K/uL 321 - -   ABG    Component Value Date/Time   PHART 7.472 (H) 10/19/2018 0343   PCO2ART 38.8 10/19/2018 0343   PO2ART 66.0 (L) 10/19/2018 0343   HCO3 28.3  (H) 10/19/2018 0343   TCO2 29 10/19/2018 0343   ACIDBASEDEF 2.0 10/17/2018 1544   O2SAT 94.0 10/19/2018 0343   CBG (last 3)  Recent Labs    10/18/18 2302 10/19/18 0336 10/19/18 0723  GLUCAP 283* 281* 236*   CC time 33 minutes  Chesley Mires, MD [place] 10/19/2018, 7:40 AM

## 2018-10-19 NOTE — Progress Notes (Signed)
Pharmacy note: TPN  78 yo male on TPN. Pharmacy managing electrolytes -K= 3.9, phos= 2.4  Plan -10 mmol phos IV. -BMET and phos in am  Hildred Laser, PharmD Clinical Pharmacist **Pharmacist phone directory can now be found on [email or web address] ([email or web address]).  Listed under [place].

## 2018-10-19 NOTE — Progress Notes (Addendum)
Patient ID: Tristan Hughes, male   DOB: 05-10-1940, 78 y.o.   MRN: [id number]   Acute Care Surgery Service Progress Note:    Chief Complaint/Subjective: Pressors weaned off Some increase in PEEP Got 1 dose lasix today  Objective: Vital signs in last 24 hours: Temp:  [98.7 F (37.1 C)-99.2 F (37.3 C)] 99.2 F (37.3 C) (06/20 0801) Pulse Rate:  [71-98] 82 (06/20 0730) Resp:  [13-34] 23 (06/20 0730) BP: (100-126)/(44-75) 109/54 (06/20 0730) SpO2:  [94 %-100 %] 97 % (06/20 0801) FiO2 (%):  [40 %-60 %] 60 % (06/20 0801) Last BM Date: 10/15/18  Intake/Output from previous day: 06/19 0701 - 06/20 0700 In: 4075.1 [I.V.:2285.8; IV Piggyback:1789.3] Out: 0086 [Urine:2900; Drains:755] Intake/Output this shift: No intake/output data recorded.  Lungs: coarse BS b/l  Cardiovascular: reg  Abd: soft, distended, drains - bilious  Extremities: 1+edema, +SCDs  Neuro: intubated, weaning sedation;   Lab Results: CBC  Recent Labs    10/18/18 0500  10/19/18 0343 10/19/18 0546  WBC 21.7*  --   --  24.1*  HGB 7.2*   < > 7.8* 7.2*  HCT 23.3*   < > 23.0* 23.1*  PLT 421*  --   --  321   < > = values in this interval not displayed.   BMET Recent Labs    10/18/18 2030 10/19/18 0343 10/19/18 0546  NA 147* 147* 145  K 3.3* 3.5 3.5  CL 112*  --  109  CO2 28  --  27  GLUCOSE 349*  --  287*  BUN 32*  --  31*  CREATININE 1.09  --  1.04  CALCIUM 7.1*  --  7.3*   LFT Hepatic Function Latest Ref Rng & Units 10/17/2018 10/17/2018 10/16/2018  Total Protein 6.5 - 8.1 g/dL 4.7(L) 4.9(L) 5.0(L)  Albumin 3.5 - 5.0 g/dL 1.8(L) 1.9(L) 1.7(L)  AST 15 - 41 U/L 54(H) 54(H) 53(H)  ALT 0 - 44 U/L 36 33 34  Alk Phosphatase 38 - 126 U/L 60 65 64  Total Bilirubin 0.3 - 1.2 mg/dL 1.0 1.0 0.9  Bilirubin, Direct 0.0 - 0.3 mg/dL - - -   PT/INR Recent Labs    10/16/18 1528  LABPROT 16.7*  INR 1.4*   ABG Recent Labs    10/18/18 1547 10/19/18 0343  PHART 7.577* 7.472*  HCO3 29.8* 28.3*     Studies/Results:  Anti-infectives: Anti-infectives (From admission, onward)   Start     Dose/Rate Route Frequency Ordered Stop   10/18/18 1300  metroNIDAZOLE (FLAGYL) IVPB 500 mg     500 mg 100 mL/hr over 60 Minutes Intravenous Every 8 hours 10/18/18 1230     10/18/18 1130  ceFEPIme (MAXIPIME) 2 g in sodium chloride 0.9 % 100 mL IVPB     2 g 200 mL/hr over 30 Minutes Intravenous Every 8 hours 10/18/18 1049     10/18/18 0300  anidulafungin (ERAXIS) 100 mg in sodium chloride 0.9 % 100 mL IVPB    Note to Pharmacy: Gross gastric contamination.  Coverage for yeast.   100 mg 78 mL/hr over 100 Minutes Intravenous Every 24 hours 10/17/18 0118     10/17/18 0130  anidulafungin (ERAXIS) 200 mg in sodium chloride 0.9 % 200 mL IVPB     200 mg 78 mL/hr over 200 Minutes Intravenous  Once 10/17/18 0119 10/17/18 0633   10/16/18 2342  vancomycin variable dose per unstable renal function (pharmacist dosing)  Status:  Discontinued      Does not apply  See admin instructions 10/16/18 2342 10/18/18 0822   10/12/18 1800  vancomycin (VANCOCIN) 2,000 mg in sodium chloride 0.9 % 500 mL IVPB  Status:  Discontinued     2,000 mg 250 mL/hr over 120 Minutes Intravenous Every 24 hours 10/12/18 0958 10/16/18 2342   10/09/18 1800  vancomycin (VANCOCIN) 1,500 mg in sodium chloride 0.9 % 500 mL IVPB  Status:  Discontinued     1,500 mg 250 mL/hr over 120 Minutes Intravenous Every 24 hours 10/08/18 1648 10/12/18 0957   10/09/18 0200  piperacillin-tazobactam (ZOSYN) IVPB 3.375 g  Status:  Discontinued     3.375 g 12.5 mL/hr over 240 Minutes Intravenous Every 8 hours 10/08/18 1648 10/18/18 1040   10/08/18 1700  vancomycin (VANCOCIN) 1,500 mg in sodium chloride 0.9 % 500 mL IVPB     1,500 mg 250 mL/hr over 120 Minutes Intravenous  Once 10/08/18 1648 10/08/18 1924   10/08/18 1645  piperacillin-tazobactam (ZOSYN) IVPB 3.375 g     3.375 g 100 mL/hr over 30 Minutes Intravenous  Once 10/08/18 1638 10/08/18 1749   10/01/18  1600  ceFAZolin (ANCEF) IVPB 2g/100 mL premix     2 g 200 mL/hr over 30 Minutes Intravenous Every 8 hours 10/01/18 1539 10/01/18 1759   10/01/18 0631  ceFAZolin (ANCEF) 2-4 GM/100ML-% IVPB    Note to Pharmacy: Marga Melnick   : cabinet override      10/01/18 0631 10/01/18 0814   10/01/18 0630  ceFAZolin (ANCEF) IVPB 2g/100 mL premix     2 g 200 mL/hr over 30 Minutes Intravenous On call to O.R. 10/01/18 7106 10/01/18 2694      Medications: Scheduled Meds: . chlorhexidine  15 mL Mouth Rinse BID  . chlorhexidine gluconate (MEDLINE KIT)  15 mL Mouth Rinse BID  . Chlorhexidine Gluconate Cloth  6 each Topical Daily  . Chlorhexidine Gluconate Cloth  6 each Topical Daily  . heparin injection (subcutaneous)  5,000 Units Subcutaneous Q8H  . insulin aspart  0-20 Units Subcutaneous Q4H  . insulin glargine  30 Units Subcutaneous Daily  . mouth rinse  15 mL Mouth Rinse 10 times per day  . pantoprazole  40 mg Intravenous Q12H  . sodium chloride flush  10-40 mL Intracatheter Q12H   Continuous Infusions: . sodium chloride 10 mL/hr at 10/19/18 0700  . anidulafungin Stopped (10/19/18 0438)  . ceFEPime (MAXIPIME) IV Stopped (10/19/18 8546)  . fentaNYL infusion INTRAVENOUS    . metronidazole Stopped (10/19/18 0646)  . norepinephrine (LEVOPHED) Adult infusion Stopped (10/18/18 1548)  . pantoprozole (PROTONIX) infusion Stopped (10/19/18 0820)  . potassium PHOSPHATE IVPB (in mmol)    . TPN ADULT (ION)    . vasopressin (PITRESSIN) infusion - *FOR SHOCK* Stopped (10/19/18 0749)   PRN Meds:.sodium chloride, acetaminophen, albuterol, fentaNYL, midazolam, sodium chloride flush  Assessment/Plan: Patient Active Problem List   Diagnosis Date Noted  . Cardiac arrest ([place])   . Gastric carcinoma ([place]) 10/01/2018  . 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[place]) 10/01/2018  . Genetic testing 09/13/2018  . 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 Family history of colon cancer   . Family  history of pancreatic cancer   . Anemia due to antineoplastic chemotherapy 07/17/2018  . Port-A-Cath in place 06/12/2018  . Leukopenia due to antineoplastic chemotherapy ([place]) 06/12/2018  . Chemotherapy-induced thrombocytopenia 06/12/2018  . Protein malnutrition ([place]) 06/12/2018  . Drug rash 06/12/2018  . Chemotherapy-induced nausea 05/22/2018  . Gastric adenocarcinoma ([place]) 05/08/2018  . Abdominal pain 05/08/2018  . Obesity (BMI 30-39.9) 10/16/2013  .  Type 2 diabetes mellitus, controlled ([place]) 07/09/2009  . Hyperlipidemia 07/09/2009  . OBSTRUCTIVE SLEEP APNEA 07/09/2009  . Essential hypertension 07/09/2009  . Coronary atherosclerosis 07/09/2009  . COLONIC POLYPS, HX OF 07/09/2009   Adenocarcinoma of the gastric cardia, pancreatic neuroendocrine tumor S/p diagnostic laparoscopy, proximal gastrectomy with esophago-gastrostomy, pyloroplasty, jejunostomy tube placement, intraoperative ultrasound, distal pancreatectomy, splenectomy 10/01/2018, Dr. Stark Klein POD #15  -UGI 10/08/2018 shows mild posterior leak. -acute respiratory distress, intubated, coded x 2, placed on 3 pressors -takeback 6/17 with resection of necrotic stomach, open end of distal esophagus, repair SB enterotomy; abthera vac Dr Barry Dienes -takeback 6/18 with washout and placement of drains, closure of fascia.  Dr Barry Dienes  TNA Sepsis - improving - no pressors.  Broad spectrum antibiotics including antifungal.   Will need to change out OG to NG and place at same level with xray confirmation prior to extubation (pt has open ended draining esophagus)  GI - bowel rest, monitor J tube drainage; may consider restart trophic J tube feeds sometime next week (give enterotomy chance to heal) Endo - Increase lantus/SSI for hyperglycemia FEN - lytes ok. Cont TPN; monitor volume status - may need low volume mIVF, CVP 6 today Renal - cont foley; ARF resolved. Cr nml ID - cont broad spectrum abx ABL anemia - hgb stable. Repeat in am VTE  prophylaxis - scds, heparin   TENTATIVE PLAN to create esophageal conduit in around 6 weeks depending on recovery.   Updated daughter at 1600  LOS: 9 days    Leighton Ruff. Redmond Pulling, MD, FACS General, Bariatric, & Minimally Invasive Surgery [phone] [facility]

## 2018-10-20 ENCOUNTER — Inpatient Hospital Stay (HOSPITAL_COMMUNITY): Payer: Medicare Other

## 2018-10-20 LAB — BASIC METABOLIC PANEL
Anion gap: 7 (ref 5–15)
BUN: 30 mg/dL — ABNORMAL HIGH (ref 8–23)
CO2: 28 mmol/L (ref 22–32)
Calcium: 7.5 mg/dL — ABNORMAL LOW (ref 8.9–10.3)
Chloride: 112 mmol/L — ABNORMAL HIGH (ref 98–111)
Creatinine, Ser: 1 mg/dL (ref 0.61–1.24)
GFR calc Af Amer: >60 mL/min (ref >60)
GFR calc non Af Amer: >60 mL/min (ref >60)
Glucose, Bld: 274 mg/dL — ABNORMAL HIGH (ref 70–99)
Potassium: 4 mmol/L (ref 3.5–5.1)
Sodium: 147 mmol/L — ABNORMAL HIGH (ref 135–145)

## 2018-10-20 LAB — CBC
HCT: 24 % — ABNORMAL LOW (ref 39.0–52.0)
Hemoglobin: 7.4 g/dL — ABNORMAL LOW (ref 13.0–17.0)
MCH: 28 pg (ref 26.0–34.0)
MCHC: 30.8 g/dL (ref 30.0–36.0)
MCV: 90.9 fL (ref 80.0–100.0)
Platelets: 226 10*3/uL (ref 150–400)
RBC: 2.64 MIL/uL — ABNORMAL LOW (ref 4.22–5.81)
RDW: 16.4 % — ABNORMAL HIGH (ref 11.5–15.5)
WBC: 22.5 10*3/uL — ABNORMAL HIGH (ref 4.0–10.5)
nRBC: 2.3 % — ABNORMAL HIGH (ref 0.0–0.2)

## 2018-10-20 LAB — TYPE AND SCREEN
ABO/RH(D): AB POS
Antibody Screen: NEGATIVE
Unit division: 0
Unit division: 0

## 2018-10-20 LAB — GLUCOSE, CAPILLARY
Glucose-Capillary: 261 mg/dL — ABNORMAL HIGH (ref 70–99)
Glucose-Capillary: 230 mg/dL — ABNORMAL HIGH (ref 70–99)
Glucose-Capillary: 199 mg/dL — ABNORMAL HIGH (ref 70–99)
Glucose-Capillary: 195 mg/dL — ABNORMAL HIGH (ref 70–99)
Glucose-Capillary: 214 mg/dL — ABNORMAL HIGH (ref 70–99)
Glucose-Capillary: 240 mg/dL — ABNORMAL HIGH (ref 70–99)

## 2018-10-20 LAB — BPAM RBC
Blood Product Expiration Date: 202007022359
Blood Product Expiration Date: 202007132359
ISSUE DATE / TIME: 202006170924
Unit Type and Rh: 5100
Unit Type and Rh: 6200

## 2018-10-20 LAB — IRON AND TIBC
Iron: 8 ug/dL — ABNORMAL LOW (ref 45–182)
Saturation Ratios: 5 % — ABNORMAL LOW (ref 17.9–39.5)
TIBC: 169 ug/dL — ABNORMAL LOW (ref 250–450)
UIBC: 161 ug/dL

## 2018-10-20 LAB — PHOSPHORUS: Phosphorus: 2.6 mg/dL (ref 2.5–4.6)

## 2018-10-20 LAB — FERRITIN: Ferritin: 115 ng/mL (ref 24–336)

## 2018-10-20 LAB — MAGNESIUM: Magnesium: 2.2 mg/dL (ref 1.7–2.4)

## 2018-10-20 MED ORDER — FUROSEMIDE 10 MG/ML IJ SOLN
40.0000 mg | Freq: Once | INTRAMUSCULAR | Status: AC
  Administered 2018-10-20: 40 mg via INTRAVENOUS
  Filled 2018-10-20: qty 4

## 2018-10-20 MED ORDER — STERILE WATER FOR INJECTION IV SOLN
INTRAVENOUS | Status: AC
  Administered 2018-10-20: 18:00:00 via INTRAVENOUS
  Filled 2018-10-20: qty 1056

## 2018-10-20 MED ORDER — INSULIN GLARGINE 100 UNIT/ML ~~LOC~~ SOLN
35.0000 [IU] | Freq: Every day | SUBCUTANEOUS | Status: DC
  Administered 2018-10-20: 35 [IU] via SUBCUTANEOUS
  Filled 2018-10-20 (×2): qty 0.35

## 2018-10-20 MED ORDER — STERILE WATER FOR INJECTION IV SOLN
INTRAVENOUS | Status: AC
  Filled 2018-10-20: qty 774.4

## 2018-10-20 NOTE — Progress Notes (Signed)
Patient ID: Tristan Hughes, male   DOB: [birth year], 78 y.o.   MRN: [id number]   Acute Care Surgery Service Progress Note:    Chief Complaint/Subjective: Pressors off Coming back down on PEEP - appreciate CCMs assistance Got 1 dose lasix yesterday  Objective: Vital signs in last 24 hours: Temp:  [99.3 F (37.4 C)-100.9 F (38.3 C)] 99.3 F (37.4 C) (06/21 0400) Pulse Rate:  [76-106] 80 (06/21 0700) Resp:  [6-29] 21 (06/21 0700) BP: (86-163)/(48-79) 123/55 (06/21 0743) SpO2:  [90 %-99 %] 98 % (06/21 0743) FiO2 (%):  [40 %-50 %] 40 % (06/21 0743) Last BM Date: 10/15/18  Intake/Output from previous day: 06/20 0701 - 06/21 0700 In: 2148.9 [I.V.:758; IV Piggyback:1390.9] Out: 3340 [Urine:2500; Drains:840] Intake/Output this shift: No intake/output data recorded.  Lungs: coarse BS bilaterally; ronchi  Cardiovascular: RRR  Abd: soft, mildly distended, drains - bilious, stable  Extremities: 1+edema, +SCDs  Neuro: intubated, weaning sedation  Lab Results: CBC  Recent Labs    10/19/18 0546 10/20/18 0544  WBC 24.1* 22.5*  HGB 7.2* 7.4*  HCT 23.1* 24.0*  PLT 321 226   BMET Recent Labs    10/19/18 1950 10/20/18 0544  NA 148* 147*  K 3.9 4.0  CL 112* 112*  CO2 28 28  GLUCOSE 247* 274*  BUN 31* 30*  CREATININE 1.03 1.00  CALCIUM 7.4* 7.5*   LFT Hepatic Function Latest Ref Rng & Units 10/17/2018 10/17/2018 10/16/2018  Total Protein 6.5 - 8.1 g/dL 4.7(L) 4.9(L) 5.0(L)  Albumin 3.5 - 5.0 g/dL 1.8(L) 1.9(L) 1.7(L)  AST 15 - 41 U/L 54(H) 54(H) 53(H)  ALT 0 - 44 U/L 36 33 34  Alk Phosphatase 38 - 126 U/L 60 65 64  Total Bilirubin 0.3 - 1.2 mg/dL 1.0 1.0 0.9  Bilirubin, Direct 0.0 - 0.3 mg/dL - - -   PT/INR No results for input(s): LABPROT, INR in the last 72 hours. ABG Recent Labs    10/18/18 1547 10/19/18 0343  PHART 7.577* 7.472*  HCO3 29.8* 28.3*    Studies/Results:  Anti-infectives: Anti-infectives (From admission, onward)   Start     Dose/Rate  Route Frequency Ordered Stop   10/18/18 1300  metroNIDAZOLE (FLAGYL) IVPB 500 mg     500 mg 100 mL/hr over 60 Minutes Intravenous Every 8 hours 10/18/18 1230     10/18/18 1130  ceFEPIme (MAXIPIME) 2 g in sodium chloride 0.9 % 100 mL IVPB     2 g 200 mL/hr over 30 Minutes Intravenous Every 8 hours 10/18/18 1049     10/18/18 0300  anidulafungin (ERAXIS) 100 mg in sodium chloride 0.9 % 100 mL IVPB    Note to Pharmacy: Gross gastric contamination.  Coverage for yeast.   100 mg 78 mL/hr over 100 Minutes Intravenous Every 24 hours 10/17/18 0118     10/17/18 0130  anidulafungin (ERAXIS) 200 mg in sodium chloride 0.9 % 200 mL IVPB     200 mg 78 mL/hr over 200 Minutes Intravenous  Once 10/17/18 0119 10/17/18 0633   10/16/18 2342  vancomycin variable dose per unstable renal function (pharmacist dosing)  Status:  Discontinued      Does not apply See admin instructions 10/16/18 2342 10/18/18 0822   10/12/18 1800  vancomycin (VANCOCIN) 2,000 mg in sodium chloride 0.9 % 500 mL IVPB  Status:  Discontinued     2,000 mg 250 mL/hr over 120 Minutes Intravenous Every 24 hours 10/12/18 0958 10/16/18 2342   10/09/18 1800  vancomycin (VANCOCIN) 1,500 mg  in sodium chloride 0.9 % 500 mL IVPB  Status:  Discontinued     1,500 mg 250 mL/hr over 120 Minutes Intravenous Every 24 hours 10/08/18 1648 10/12/18 0957   10/09/18 0200  piperacillin-tazobactam (ZOSYN) IVPB 3.375 g  Status:  Discontinued     3.375 g 12.5 mL/hr over 240 Minutes Intravenous Every 8 hours 10/08/18 1648 10/18/18 1040   10/08/18 1700  vancomycin (VANCOCIN) 1,500 mg in sodium chloride 0.9 % 500 mL IVPB     1,500 mg 250 mL/hr over 120 Minutes Intravenous  Once 10/08/18 1648 10/08/18 1924   10/08/18 1645  piperacillin-tazobactam (ZOSYN) IVPB 3.375 g     3.375 g 100 mL/hr over 30 Minutes Intravenous  Once 10/08/18 1638 10/08/18 1749   10/01/18 1600  ceFAZolin (ANCEF) IVPB 2g/100 mL premix     2 g 200 mL/hr over 30 Minutes Intravenous Every 8 hours  10/01/18 1539 10/01/18 1759   10/01/18 0631  ceFAZolin (ANCEF) 2-4 GM/100ML-% IVPB    Note to Pharmacy: Marga Melnick   : cabinet override      10/01/18 0631 10/01/18 0814   10/01/18 0630  ceFAZolin (ANCEF) IVPB 2g/100 mL premix     2 g 200 mL/hr over 30 Minutes Intravenous On call to O.R. 10/01/18 0625 10/01/18 0814      Medications: Scheduled Meds:  chlorhexidine  15 mL Mouth Rinse BID   chlorhexidine gluconate (MEDLINE KIT)  15 mL Mouth Rinse BID   Chlorhexidine Gluconate Cloth  6 each Topical Daily   Chlorhexidine Gluconate Cloth  6 each Topical Daily   heparin injection (subcutaneous)  5,000 Units Subcutaneous Q8H   insulin aspart  0-20 Units Subcutaneous Q4H   insulin glargine  35 Units Subcutaneous Daily   mouth rinse  15 mL Mouth Rinse 10 times per day   pantoprazole  40 mg Intravenous Q12H   sodium chloride flush  10-40 mL Intracatheter Q12H   Continuous Infusions:  sodium chloride Stopped (10/20/18 0205)   anidulafungin 78 mL/hr at 10/20/18 0300   ceFEPime (MAXIPIME) IV 2 g (10/20/18 0551)   fentaNYL infusion INTRAVENOUS 100 mcg/hr (10/20/18 0300)   metronidazole 500 mg (10/20/18 0407)   PRN Meds:.sodium chloride, acetaminophen, albuterol, fentaNYL, midazolam, sodium chloride flush  Assessment/Plan: Patient Active Problem List   Diagnosis Date Noted   Cardiac arrest ([place])    Gastric carcinoma ([place]) 10/01/2018   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[place]) 10/01/2018   Genetic testing 09/13/2018   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   Family history of colon cancer    Family history of pancreatic cancer    Anemia due to antineoplastic chemotherapy 07/17/2018   Port-A-Cath in place 06/12/2018   Leukopenia due to antineoplastic chemotherapy ([place]) 06/12/2018   Chemotherapy-induced thrombocytopenia 06/12/2018   Protein malnutrition ([place]) 06/12/2018   Drug rash 06/12/2018   Chemotherapy-induced nausea 05/22/2018    Gastric adenocarcinoma ([place]) 05/08/2018   Abdominal pain 05/08/2018   Obesity (BMI 30-39.9) 10/16/2013   Type 2 diabetes mellitus, controlled ([place]) 07/09/2009   Hyperlipidemia 07/09/2009   OBSTRUCTIVE SLEEP APNEA 07/09/2009   Essential hypertension 07/09/2009   Coronary atherosclerosis 07/09/2009   COLONIC POLYPS, HX OF 07/09/2009   Adenocarcinoma of the gastric cardia, pancreatic neuroendocrine tumor S/p diagnostic laparoscopy, proximal gastrectomy with esophago-gastrostomy, pyloroplasty, jejunostomy tube placement, intraoperative ultrasound, distal pancreatectomy, splenectomy 10/01/2018, Dr. Stark Klein POD #16  -UGI 10/08/2018 shows mild posterior leak. -acute respiratory distress, intubated, coded x 2, placed on 3 pressors -takeback 6/17 with resection of necrotic stomach, open end of  distal esophagus, repair SB enterotomy; abthera vac Dr Barry Dienes -takeback 6/18 with washout and placement of drains, closure of fascia.  Dr Barry Dienes  TNA Sepsis - improving - off pressors.  Broad spectrum antibiotics including antifungal coverage.   Will need to change out OG to NG and place at same level with xray confirmation prior to extubation (pt has open ended draining esophagus); PEEP coming down today - went up yesteday  GI - bowel rest, monitor J tube drainage; may consider restart trophic J tube feeds sometime next week (give enterotomy chance to heal) Endo - Increase lantus/SSI for hyperglycemia FEN - lytes ok. Cont TPN; monitor volume status - may need low volume mIVF, CVP 6 today Renal - cont foley; ARF resolved. Cr nml ID - cont broad spectrum abx ABL anemia - hgb stable. Repeat in am VTE prophylaxis - scds, heparin  TENTATIVE PLAN to create esophageal conduit in around 6 weeks depending on recovery.    I updated his daughter via phone this morning - 0820   LOS: 19 days   Sharon Mt. Dema Severin, M.D. [facility]

## 2018-10-20 NOTE — Progress Notes (Signed)
30 ml versed wasted in hazardous bin with Lauree Chandler, Therapist, sports.

## 2018-10-20 NOTE — Progress Notes (Addendum)
[facility] CONSULT NOTE   Pharmacy Consult for TPN Indication: post-gastrectomy  Patient Measurements: Height: 5\' 9"  (175.3 cm) Weight: 209 lb 3.5 oz (94.9 kg) IBW/kg (Calculated) : 70.7 TPN AdjBW (KG): 76.7 Body mass index is 30.9 kg/m. Usual Weight: ~220 lbs  Assessment:  78 yo M admitted on 6/2 for laparoscopy in the setting of known adenocarcinoma of the GE junction. S/p proximal gastrectomy with esophagogastrostomy, pyloroplasty, jejunostomy tube placement, distal pancreatectomy and splenectomy. Developed respiratory distress requiring intubation and a PEA arrest on 6/17. Repeat abdominal imaging 6/17 with concern for stomach necrosis. S/p complete gastrectomy and VAC change 6/18. Has been receiving tube feeds since 6/4 (stopped 6/18) and has moderate protein caloric malnutrition.   GI: TF 6/4>>6/18, albumin 1.8, prealbumin 5.3 Endo: Hx DM and now s/p distal pancreatectomy. Prior to admission on Lantus 40 daily + Novolog ~30 units daily; Decadron 5mg  x1 on 6/17. Lantus restarted 6/19. CBGs 230-270. MD dosing Lantus - requested NOT to add insulin to TPN bag to prevent errors. Insulin requirements in the past 24 hours: 46 units aspart on 6/20 (decreasing) Lytes: K 4, coCa 9.3, Na 145>>147, Cl 109, phos wnl after replacement (prior phos of 5.7 may have been error); mag stable 2.2 Renal: AKI resolving. SCr down 1 (baseline ~ 0.8), UOP 1.1 ml/kg/hr (Lasix x1 given 6/20). BUN down to 30. Pulm: intubated 6/17 for acute hypoxemic respiratory failure, possible aspiration, moderate ARDS. Giving Lasix 40mg  IV x1 again today.  Cards: s/p PEA arrest on 6/17. MAP 73 (using cuff as aline not working) and off pressors this AM. CVP 7 (ARDS target CVP <4). Hepatobil: AST/ALT 54/36, tbili wnl Neuro: sedated on fentanyl at 100, off versed. RASS -2.  ID: abx for aspiration PNA and peritonitis- Ecoli intermediate to Zosyn>> changed to Cefepime + Flagyl and continuing Eraxis.  WBC 24.1>>22.5, LA 8.4 (decr), Tm 100.5  TPN Access: central line placed 6/17 TPN start date: 6/18 Nutritional Goals (per RD recommendations on 6/18) KCal: 2438 kcal Protein: 135-175 g Fluid: >2,000 mL  Goal TPN rate is 75 ml/hr (provides 1800 mL/day) - this provides 158 g protein, 270 g dextrose, and 73.8 g lipids meeting 100% of protein needs and ~95% of kcal needs based on current goals.  *Allowing lower kcal to help reduce dextrose amount with uncontrolled CBGs and concentrated to reduce volume in the setting of ARDS.   Current Nutrition: tube feeds off 6/17, NPO, TPN initiated 6/18  Plan:  Increase TPN to 75 mL/hr (have been slowly increasing to help prevent refeeding).  This TPN provides 158 g of protein, 270 g of dextrose, and 73.8 g of lipids which provides 2290 kCals per day, meeting 100% of protein needs and ~95% of kcal needs  *Allowing for low kcal goal to help reduce dextrose amount with uncontrolled CBGs; TPN has been concentrated to help reduce volume in setting of ARDS).   Electrolytes in TPN: decrease Na, increase phos, others standard, chloride/acetate 1:2  Add MVI to TPN Trace elements only MWF due to national shortage *Discussed with Dr. Halford Chessman on 6/20- safer to do insulin either all in bag or all out of bag; will do all out of bag for now per his request.  Continue Resistant SSI q4h Insulin glargine increased to 35 units daily per MD.  Monitor TPN labs, CBGs, and fluid status  Sloan Leiter, PharmD, BCPS, BCCCP Clinical Pharmacist Please refer to [facility] for [place] numbers 10/20/2018       8:05 AM

## 2018-10-20 NOTE — Progress Notes (Signed)
NAME:  Tristan Hughes, MRN:  [id number], DOB:  1940-11-14, LOS: 51 ADMISSION DATE:  10/01/2018, CONSULTATION DATE:  10/16/18 REFERRING MD:  Dr. Barry Dienes , CHIEF COMPLAINT:  Respiratory Distress   Brief History   78 y/o M admitted 6/2 for planned laparoscopy in setting of known adenocarcinoma of the GE junction.  He is s/p proximal gastrectomy with esophagogastrostomy, pyloroplasty, jejunostomy tube placement, distal pancreatectomy and splenectomy.  There were concerns for possible leak post operatively.  CT of the ABD 6/12 raised concern for gas/fluid in the LUQ.  He developed respiratory distress 6/17 and was transferred to ICU.  Post intubation suffered PEA arrest requiring ~ 8 minutes of CPR before ROSC. Repeat ABD imaging 6/17 with concerns for stomach necrosis. He returned to the OR for completion gastrectomy. Returned 6/18 for VAC change, exploration & multiple drain placement.   Past Medical History  OSA , CAD, HTN, HLD, GERD, Gastric Cancer - identified 03/2018, s/p three cycles of FOLFOX, DM II, Depression, Anxiety   Significant Hospital Events   6/02 Admit  6/17 PCCM consulted for resp distress, PEA arrest post intubation x2.  To OR for gastrectomy 6/18 Off epi gtt.  To OR for VAC change, exploration and drain placement 6/21 Off pressors, start pressure support  Consults:  TCTS 6/19   Procedures:  ETT 6/17 >> L IJ TLC 6/17 >>  L Radial Aline 6/17 >> 6/20  Significant Diagnostic Tests:  CT Abd w/o 6/12 >> surgical changes of proximal gastrectomy with primary anastomosis, with no evidence of enteric contrast leak/anastomic leak or stomach wall disruption after PO contrast. Surgical changes of distal pancreatectomy & splenectomy with persisting flocculent gas/fluid in the LUQ (DDx infection, with perforation / disruption at the surgical site or stomach not likely given the absence of extraluminal contrast) CT Chest / ABD / Pelvis 6/17 >> dense bibasilar consolidation, nodular  airspace disease in RML, bilateral upper lobes, large well-circumscribed flocculent gas collection in the LUQ with concern for rupture of the stomach and necrosis / abscess. Fluid & gas in the distal esophagus which extends high into the esophagus.  Progression of subcutaneous gas in the left chest wall & right chest wall  Micro Data:  BCx2 6/8 >> negative  UC 6/8 >> negative  Tracheal aspirate 6/17 >> E coli BCx2 6/17 >>   Antimicrobials:  Anidulafungin 6/18 >>  Cefepime 6/19 >>  Interim history/subjective:  Still has respiratory secretions.  Objective   Blood pressure (!) 123/55, pulse 80, temperature 99.3 F (37.4 C), temperature source Oral, resp. rate (!) 21, height 5\' 9"  (1.753 m), weight 94.9 kg, SpO2 96 %. CVP:  [6 mmHg-10 mmHg] 7 mmHg  Vent Mode: PRVC FiO2 (%):  [40 %-60 %] 40 % Set Rate:  [20 bmp] 20 bmp Vt Set:  [560 mL] 560 mL PEEP:  [8 cmH20] 8 cmH20 Plateau Pressure:  [18 cmH20-23 cmH20] 21 cmH20   Intake/Output Summary (Last 24 hours) at 10/20/2018 0750 Last data filed at 10/20/2018 0700 Gross per 24 hour  Intake 2148.89 ml  Output 3340 ml  Net -1191.11 ml   Filed Weights   10/11/18 0500 10/12/18 0400 10/14/18 1957  Weight: 97.3 kg 98.1 kg 94.9 kg    Examination:  General - sedated Eyes - pupils reactive ENT - ETT in place Cardiac - regular rate/rhythm, no murmur Chest - scattered rhonchi Abdomen - wound vac, drains in place Extremities - 2+ edema Skin - no rashes Neuro - RASS -2, moves extremities and opens eyes  with stimulation GU - scrotal edema  CXR (reviewed by me) - b/l ASD  Resolved Hospital Problem list   PEA arrest, AKI, Lactic acidosis, Thrombocytosis, Septic shock, ARDS, Hypophosphatemia  Assessment & Plan:   Sepsis from necrotic stomach with peritonitis and aspiration pneumonia with E coli. Plan - day 13/14 of Abx, currently on cefepime and flagyl - day 5 of anidulafungin  Acute hypoxic respiratory failure with from aspiration  pneumonia. Plan: - start pressure support trials - not ready for extubation trial at this time - lasix 40 mg IV x one 6/21 - f/u CXR - goal SpO2 88 to 95%  Adenocarcinoma of GE Junction s/p Laparoscopy with Gastrectomy. Discussion: -proximal gastrectomy with esophagogastrostomy, pyloroplasty, jejunostomy tube placement, distal pancreatectomy and splenectomy 6/2.   -completed 3 cycles of FOLFOX -concern for LUQ gas/fluid on 6/12 CT ABD > back to OR for gastrectomy -abd closed 6/18 Plan - post op care per surgery - TCTS consulted - do not manipulate NG tube - continue protonix  Acute metabolic encephalopathy. Plan - RASS goal -1 to -2  Moderate Protein Calorie Malnutrition  Plan - continue TPN  Anemia of critical illness. Plan - f/u CBC - transfuse for Hb < 7 - check iron levels  HTN, HLD, CAD s/p CABG. P: - hold outpt norvasc, ASA, zetia, lisinopril, lopressor, crestor  DM with Hyperglycemia  Plan - SSI  - increase lantus to 35 units daily on 6/21  Best practice:  Diet: TPN DVT prophylaxis: SQ heparin GI prophylaxis: Protonix Mobility: bed rest Code Status: Full Code  Disposition: ICU   Labs    CMP Latest Ref Rng & Units 10/20/2018 10/19/2018 10/19/2018  Glucose 70 - 99 mg/dL 274(H) 247(H) 287(H)  BUN 8 - 23 mg/dL 30(H) 31(H) 31(H)  Creatinine 0.61 - 1.24 mg/dL 1.00 1.03 1.04  Sodium 135 - 145 mmol/L 147(H) 148(H) 145  Potassium 3.5 - 5.1 mmol/L 4.0 3.9 3.5  Chloride 98 - 111 mmol/L 112(H) 112(H) 109  CO2 22 - 32 mmol/L 28 28 27   Calcium 8.9 - 10.3 mg/dL 7.5(L) 7.4(L) 7.3(L)  Total Protein 6.5 - 8.1 g/dL - - -  Total Bilirubin 0.3 - 1.2 mg/dL - - -  Alkaline Phos 38 - 126 U/L - - -  AST 15 - 41 U/L - - -  ALT 0 - 44 U/L - - -   CBC Latest Ref Rng & Units 10/20/2018 10/19/2018 10/19/2018  WBC 4.0 - 10.5 K/uL 22.5(H) 24.1(H) -  Hemoglobin 13.0 - 17.0 g/dL 7.4(L) 7.2(L) 7.8(L)  Hematocrit 39.0 - 52.0 % 24.0(L) 23.1(L) 23.0(L)  Platelets 150 - 400 K/uL 226  321 -   ABG    Component Value Date/Time   PHART 7.472 (H) 10/19/2018 0343   PCO2ART 38.8 10/19/2018 0343   PO2ART 66.0 (L) 10/19/2018 0343   HCO3 28.3 (H) 10/19/2018 0343   TCO2 29 10/19/2018 0343   ACIDBASEDEF 2.0 10/17/2018 1544   O2SAT 94.0 10/19/2018 0343   CBG (last 3)  Recent Labs    10/19/18 2313 10/20/18 0316 10/20/18 0717  GLUCAP 212* 261* 230*   CC time 32 minutes  Chesley Mires, MD [place] 10/20/2018, 7:50 AM

## 2018-10-21 ENCOUNTER — Inpatient Hospital Stay (HOSPITAL_COMMUNITY): Payer: Medicare Other

## 2018-10-21 DIAGNOSIS — G931 Anoxic brain damage, not elsewhere classified: Secondary | ICD-10-CM

## 2018-10-21 DIAGNOSIS — C160 Malignant neoplasm of cardia: Principal | ICD-10-CM

## 2018-10-21 LAB — GLUCOSE, CAPILLARY
Glucose-Capillary: 254 mg/dL — ABNORMAL HIGH (ref 70–99)
Glucose-Capillary: 258 mg/dL — ABNORMAL HIGH (ref 70–99)
Glucose-Capillary: 284 mg/dL — ABNORMAL HIGH (ref 70–99)
Glucose-Capillary: 363 mg/dL — ABNORMAL HIGH (ref 70–99)
Glucose-Capillary: 328 mg/dL — ABNORMAL HIGH (ref 70–99)
Glucose-Capillary: 290 mg/dL — ABNORMAL HIGH (ref 70–99)

## 2018-10-21 LAB — COMPREHENSIVE METABOLIC PANEL
ALT: 37 U/L (ref 0–44)
AST: 19 U/L (ref 15–41)
Albumin: 1.3 g/dL — ABNORMAL LOW (ref 3.5–5.0)
Alkaline Phosphatase: 88 U/L (ref 38–126)
Anion gap: 5 (ref 5–15)
BUN: 34 mg/dL — ABNORMAL HIGH (ref 8–23)
CO2: 28 mmol/L (ref 22–32)
Calcium: 7.8 mg/dL — ABNORMAL LOW (ref 8.9–10.3)
Chloride: 116 mmol/L — ABNORMAL HIGH (ref 98–111)
Creatinine, Ser: 1.03 mg/dL (ref 0.61–1.24)
GFR calc Af Amer: >60 mL/min (ref >60)
GFR calc non Af Amer: >60 mL/min (ref >60)
Glucose, Bld: 279 mg/dL — ABNORMAL HIGH (ref 70–99)
Potassium: 4.2 mmol/L (ref 3.5–5.1)
Sodium: 149 mmol/L — ABNORMAL HIGH (ref 135–145)
Total Bilirubin: 0.4 mg/dL (ref 0.3–1.2)
Total Protein: 4.4 g/dL — ABNORMAL LOW (ref 6.5–8.1)

## 2018-10-21 LAB — DIFFERENTIAL
Abs Immature Granulocytes: 0.46 10*3/uL — ABNORMAL HIGH (ref 0.00–0.07)
Basophils Absolute: 0 10*3/uL (ref 0.0–0.1)
Basophils Relative: 0 %
Eosinophils Absolute: 0.2 10*3/uL (ref 0.0–0.5)
Eosinophils Relative: 1 %
Immature Granulocytes: 3 %
Lymphocytes Relative: 5 %
Lymphs Abs: 0.8 10*3/uL (ref 0.7–4.0)
Monocytes Absolute: 0.9 10*3/uL (ref 0.1–1.0)
Monocytes Relative: 6 %
Neutro Abs: 12.5 10*3/uL — ABNORMAL HIGH (ref 1.7–7.7)
Neutrophils Relative %: 85 %

## 2018-10-21 LAB — PHOSPHORUS: Phosphorus: 2.3 mg/dL — ABNORMAL LOW (ref 2.5–4.6)

## 2018-10-21 LAB — CBC
HCT: 24.6 % — ABNORMAL LOW (ref 39.0–52.0)
Hemoglobin: 7.3 g/dL — ABNORMAL LOW (ref 13.0–17.0)
MCH: 27.5 pg (ref 26.0–34.0)
MCHC: 29.7 g/dL — ABNORMAL LOW (ref 30.0–36.0)
MCV: 92.8 fL (ref 80.0–100.0)
Platelets: 228 10*3/uL (ref 150–400)
RBC: 2.65 MIL/uL — ABNORMAL LOW (ref 4.22–5.81)
RDW: 17 % — ABNORMAL HIGH (ref 11.5–15.5)
WBC: 14.9 10*3/uL — ABNORMAL HIGH (ref 4.0–10.5)
nRBC: 10.4 % — ABNORMAL HIGH (ref 0.0–0.2)

## 2018-10-21 LAB — CULTURE, BLOOD (ROUTINE X 2)
Culture: NO GROWTH
Culture: NO GROWTH
Special Requests: ADEQUATE
Special Requests: ADEQUATE

## 2018-10-21 LAB — PREALBUMIN: Prealbumin: <5 mg/dL — ABNORMAL LOW (ref 18–38)

## 2018-10-21 LAB — MAGNESIUM: Magnesium: 2.1 mg/dL (ref 1.7–2.4)

## 2018-10-21 LAB — TRIGLYCERIDES: Triglycerides: 77 mg/dL (ref <150)

## 2018-10-21 MED ORDER — TRACE MINERALS CR-CU-MN-SE-ZN 10-1000-500-60 MCG/ML IV SOLN
INTRAVENOUS | Status: AC
  Administered 2018-10-21: 18:00:00 via INTRAVENOUS
  Filled 2018-10-21: qty 1056

## 2018-10-21 MED ORDER — POTASSIUM PHOSPHATES 15 MMOLE/5ML IV SOLN
30.0000 mmol | Freq: Once | INTRAVENOUS | Status: AC
  Administered 2018-10-21: 10:00:00 30 mmol via INTRAVENOUS
  Filled 2018-10-21: qty 10

## 2018-10-21 MED ORDER — DEXMEDETOMIDINE HCL IN NACL 200 MCG/50ML IV SOLN
0.4000 ug/kg/h | INTRAVENOUS | Status: DC
  Administered 2018-10-21: 0.4 ug/kg/h via INTRAVENOUS
  Filled 2018-10-21: qty 50

## 2018-10-21 MED ORDER — DEXTROSE 5 % IV SOLN
INTRAVENOUS | Status: AC
  Administered 2018-10-21 – 2018-10-22 (×2): via INTRAVENOUS

## 2018-10-21 MED ORDER — PHENYLEPHRINE HCL-NACL 10-0.9 MG/250ML-% IV SOLN
0.0000 ug/min | INTRAVENOUS | Status: DC
  Administered 2018-10-21: 17:00:00 15 ug/min via INTRAVENOUS
  Administered 2018-10-21 – 2018-10-22 (×2): 20 ug/min via INTRAVENOUS
  Administered 2018-10-22: 05:00:00 15 ug/min via INTRAVENOUS
  Administered 2018-10-23: 30 ug/min via INTRAVENOUS
  Administered 2018-10-23 (×2): 20 ug/min via INTRAVENOUS
  Administered 2018-10-23 – 2018-10-24 (×3): 30 ug/min via INTRAVENOUS
  Administered 2018-10-24 – 2018-10-25 (×2): 40 ug/min via INTRAVENOUS
  Administered 2018-10-25: 12:00:00 20 ug/min via INTRAVENOUS
  Administered 2018-10-25: 50 ug/min via INTRAVENOUS
  Administered 2018-10-25 – 2018-10-26 (×3): 20 ug/min via INTRAVENOUS
  Filled 2018-10-21 (×13): qty 250

## 2018-10-21 MED ORDER — FUROSEMIDE 10 MG/ML IJ SOLN
40.0000 mg | Freq: Four times a day (QID) | INTRAMUSCULAR | Status: AC
  Administered 2018-10-21 (×3): 40 mg via INTRAVENOUS
  Filled 2018-10-21 (×3): qty 4

## 2018-10-21 MED ORDER — DEXMEDETOMIDINE HCL IN NACL 400 MCG/100ML IV SOLN
0.4000 ug/kg/h | INTRAVENOUS | Status: DC
  Administered 2018-10-21: 20:00:00 0.7 ug/kg/h via INTRAVENOUS
  Administered 2018-10-21: 12:00:00 0.4 ug/kg/h via INTRAVENOUS
  Administered 2018-10-21: 1.2 ug/kg/h via INTRAVENOUS
  Administered 2018-10-22 (×2): 1 ug/kg/h via INTRAVENOUS
  Administered 2018-10-22: 1.2 ug/kg/h via INTRAVENOUS
  Administered 2018-10-22: 0.7 ug/kg/h via INTRAVENOUS
  Administered 2018-10-22 – 2018-10-23 (×2): 1 ug/kg/h via INTRAVENOUS
  Administered 2018-10-23: 08:00:00 0.9 ug/kg/h via INTRAVENOUS
  Administered 2018-10-23: 17:00:00 0.8 ug/kg/h via INTRAVENOUS
  Administered 2018-10-23: 1 ug/kg/h via INTRAVENOUS
  Administered 2018-10-23 – 2018-10-24 (×3): 0.9 ug/kg/h via INTRAVENOUS
  Administered 2018-10-24 (×2): 1 ug/kg/h via INTRAVENOUS
  Administered 2018-10-24: 0.8 ug/kg/h via INTRAVENOUS
  Administered 2018-10-24: 0.9 ug/kg/h via INTRAVENOUS
  Administered 2018-10-25: 09:00:00 0.8 ug/kg/h via INTRAVENOUS
  Administered 2018-10-25: 0.7 ug/kg/h via INTRAVENOUS
  Administered 2018-10-25: 03:00:00 0.9 ug/kg/h via INTRAVENOUS
  Administered 2018-10-25: 12:00:00 0.8 ug/kg/h via INTRAVENOUS
  Administered 2018-10-25 – 2018-10-26 (×2): 0.7 ug/kg/h via INTRAVENOUS
  Filled 2018-10-21 (×27): qty 100

## 2018-10-21 NOTE — Progress Notes (Signed)
[facility] CONSULT NOTE   Pharmacy Consult for TPN Indication: post-gastrectomy  Patient Measurements: Height: 5\' 9"  (175.3 cm) Weight: 224 lb 10.4 oz (101.9 kg) IBW/kg (Calculated) : 70.7 TPN AdjBW (KG): 76.7 Body mass index is 33.17 kg/m. Usual Weight: ~220 lbs  Assessment:  78 yo M admitted on 6/2 for laparoscopy in the setting of known adenocarcinoma of the GE junction. S/p proximal gastrectomy with esophagogastrostomy, pyloroplasty, jejunostomy tube placement, distal pancreatectomy and splenectomy. Developed respiratory distress requiring intubation and a PEA arrest on 6/17. Repeat abdominal imaging 6/17 with concern for stomach necrosis. S/p complete gastrectomy and VAC change 6/18. Has been receiving tube feeds since 6/4 (stopped 6/18) and has moderate protein caloric malnutrition.   GI: TF 6/4>>6/18, albumin 1.8, prealbumin 5.3 Endo: Hx DM and now s/p distal pancreatectomy. Prior to admission on Lantus 40 daily + Novolog ~30 units daily; Lantus restarted 6/19, cbgs still in 200s. Insulin requirements in the past 24 hours: 40 units Novolog + Lantus 35 units (Lantus dc'd this morning) Lytes: K up 4.2, coCa 9.1, Na 149, Cl 109 > 116, phos 2.3; mag stable 2.1. MD ordered Kphos for today. Renal: AKI resolving. SCr down 1, UOP 1.3 ml/kg/hr.  Pulm: intubated 6/17 for acute hypoxemic respiratory failure, possible aspiration, moderate ARDS. Cards: s/p PEA arrest on 6/17. MAP 71 (using cuff as aline not working) and off pressors this AM. Hepatobil: AST/ALT 54/36, tbili wnl Neuro: sedated on fentanyl at 100 and versed at 2. RASS -3.  ID: abx for aspiration PNA and peritonitis- Ecoli intermediate to Zosyn>> changed to Cefepime + Flagyl and continuing Eraxis. WBC 24 >14, Tm 99  TPN Access: central line placed 6/17 TPN start date: 6/18 Nutritional Goals (per RD recommendations on 6/18) KCal: 2438 kcal Protein: 135-175 g Fluid: >2,000 mL   Current  Nutrition: tube feeds off 6/17, NPO, TPN initiated 6/18  Plan:  -Increase TPN to 75 mL/hr (slowly increasing to help prevent refeeding).  -This TPN provides 158 g of protein, 216 g of dextrose, and 73 g of lipids which provides 2106 kCals per day, meeting ~86% of kcal needs and 100% of protein needs -Electrolytes in TPN: reduce Na, others standard, chloride/acetate 1:2  -Add MVI to TPN -Trace elements to be added only MWF due to national shortage -Discontinue Lantus, will add insulin regular 30 units in bag    Tristan Hughes 10/21/2018 8:41 AM

## 2018-10-21 NOTE — Progress Notes (Signed)
Pharmacy Antibiotic Note  Tristan Hughes is a 78 y.o. male admitted on 10/01/2018 with sepsis secondary to stomach necrosis, peritonitis and aspiration pneumonia.  Pharmacy has been consulted for cefepime dosing. Patient was on vancomycin, Zosyn, and Eraxis, and was transitioned to cefepime, metronidazole and Eraxis based on resp culture results. Stable renal function, WBC 14  Plan: Continue Cefepime IV 2 g q8h  Continue Metronidazole IV 500 mg q8h  F/u Cxs, clinical status, renal function, LOT/de-escalation Continue Eraxis IV 100 mg q24h   Height: 5\' 9"  (175.3 cm) Weight: 224 lb 10.4 oz (101.9 kg) IBW/kg (Calculated) : 70.7  Temp (24hrs), Avg:100.5 F (38.1 C), Min:98.8 F (37.1 C), Max:102.3 F (39.1 C)  Recent Labs  Lab 10/16/18 1307  10/16/18 1728 10/16/18 2048 10/16/18 2220  10/17/18 0317 10/17/18 0321 10/17/18 0730 10/17/18 1400 10/18/18 0500 10/18/18 2030 10/19/18 0546 10/19/18 1950 10/20/18 0544 10/21/18 0455  WBC  --    < >  --  25.0*  --   --   --  35.4*  --   --  21.7*  --  24.1*  --  22.5* 14.9*  CREATININE  --    < >  --   --   --    < >  --  1.86*  --  1.60* 1.34* 1.09 1.04 1.03 1.00 1.03  LATICACIDVEN 7.7*  --   --  8.9*  --   --  >11.0*  --  10.4* 8.4*  --   --   --   --   --   --   VANCOTROUGH  --   --  12*  --   --   --   --   --   --   --   --   --   --   --   --   --   VANCOPEAK  --   --   --   --  31  --   --   --   --   --   --   --   --   --   --   --    < > = values in this interval not displayed.    Estimated Creatinine Clearance: 70.7 mL/min (by C-G formula based on SCr of 1.03 mg/dL).    Allergies  Allergen Reactions  . Naproxen Sodium Swelling    SWELLING REACTION UNSPECIFIED     Antimicrobials this admission: Vancomycin 6/9 >> 6/18 zosyn 6/9 >> 6/19 Eraxis 6/19 >> Cefepime 6/19 >> Flagyl 6/19>>  Dose adjustments this admission:   Microbiology results: 6/17 Resp: E.coli (intermed: zosyn) 6/17 blood cx: ng-final 6/8 blood  cx:ng-final 6/8 urine cx:ng-final  Thank you for allowing pharmacy to be a part of this patient's care.  Harrietta Guardian, PharmD PGY1 Pharmacy Resident 10/21/2018    11:32 AM Please check AMION for all [place] numbers

## 2018-10-21 NOTE — Progress Notes (Signed)
NAME:  Tristan Hughes, MRN:  [id number], DOB:  [birth year], LOS: 42 ADMISSION DATE:  10/01/2018, CONSULTATION DATE:  10/16/18 REFERRING MD:  Dr. Barry Dienes , CHIEF COMPLAINT:  Respiratory Distress   Brief History   78 y/o M admitted 6/2 for planned laparoscopy in setting of known adenocarcinoma of the GE junction.  He is s/p proximal gastrectomy with esophagogastrostomy, pyloroplasty, jejunostomy tube placement, distal pancreatectomy and splenectomy.  There were concerns for possible leak post operatively.  CT of the ABD 6/12 raised concern for gas/fluid in the LUQ.  He developed respiratory distress 6/17 and was transferred to ICU.  Post intubation suffered PEA arrest requiring ~ 8 minutes of CPR before ROSC. Repeat ABD imaging 6/17 with concerns for stomach necrosis. He returned to the OR for completion gastrectomy. Returned 6/18 for VAC change, exploration & multiple drain placement.   Past Medical History  OSA , CAD, HTN, HLD, GERD, Gastric Cancer - identified 03/2018, s/p three cycles of FOLFOX, DM II, Depression, Anxiety   Significant Hospital Events   6/02 Admit  6/17 PCCM consulted for resp distress, PEA arrest post intubation x2.  To OR for gastrectomy 6/18 Off epi gtt.  To OR for VAC change, exploration and drain placement 6/21 Off pressors, start pressure support  Consults:  TCTS 6/19   Procedures:  ETT 6/17 >> L IJ TLC 6/17 >>  L Radial Aline 6/17 >> 6/20  Significant Diagnostic Tests:  CT Abd w/o 6/12 >> surgical changes of proximal gastrectomy with primary anastomosis, with no evidence of enteric contrast leak/anastomic leak or stomach wall disruption after PO contrast. Surgical changes of distal pancreatectomy & splenectomy with persisting flocculent gas/fluid in the LUQ (DDx infection, with perforation / disruption at the surgical site or stomach not likely given the absence of extraluminal contrast) CT Chest / ABD / Pelvis 6/17 >> dense bibasilar consolidation, nodular  airspace disease in RML, bilateral upper lobes, large well-circumscribed flocculent gas collection in the LUQ with concern for rupture of the stomach and necrosis / abscess. Fluid & gas in the distal esophagus which extends high into the esophagus.  Progression of subcutaneous gas in the left chest wall & right chest wall  Micro Data:  BCx2 6/8 >> negative  UC 6/8 >> negative  Tracheal aspirate 6/17 >> E coli BCx2 6/17 >>   Antimicrobials:  Anidulafungin 6/18 >>  Cefepime 6/19 >> Flagyl 6/19>>>  Interim history/subjective:  No events overnight, continues to have increased respirator secretions.  Objective   Blood pressure 137/72, pulse 94, temperature 100.1 F (37.8 C), temperature source Axillary, resp. rate (!) 22, height 5\' 9"  (1.753 m), weight 101.9 kg, SpO2 97 %.    Vent Mode: PRVC FiO2 (%):  [40 %] 40 % Set Rate:  [20 bmp] 20 bmp Vt Set:  [560 mL] 560 mL PEEP:  [5 cmH20] 5 cmH20 Plateau Pressure:  [17 cmH20-22 cmH20] 22 cmH20   Intake/Output Summary (Last 24 hours) at 10/21/2018 0801 Last data filed at 10/21/2018 0700 Gross per 24 hour  Intake 2854.53 ml  Output 4260 ml  Net -1405.47 ml   Filed Weights   10/14/18 1957 10/20/18 1500 10/21/18 0353  Weight: 94.9 kg 102 kg 101.9 kg    Examination:  General - Sedated and intubated, acutely ill appearing male Eyes - PERRL, EOM-I and MMM ENT - ETT in place Cardiac - RRR, Nl S1/S2 and -M/R/G Chest - Coarse BS diffusely Abdomen - Wound vac in place, hypoactive BS Extremities - 2+ edema diffusely Skin -  no rashes Neuro - RASS -3, moves extremities and opens eyes with stimulation GU - scrotal edema  I reviewed CXR myself, ETT is ok with bilateral infiltrate noted  Resolved Hospital Problem list   PEA arrest, AKI, Lactic acidosis, Thrombocytosis, Septic shock, ARDS, Hypophosphatemia  Assessment & Plan:   Sepsis from necrotic stomach with peritonitis and aspiration pneumonia with E coli. Plan - day 14/21 of Abx,  currently on cefepime and flagyl - day 6 of anidulafungin  Acute hypoxic respiratory failure with from aspiration pneumonia. Plan: - Begin PS trials - Active diureses today, lasix 40 mg IV q6 x3 doses - D5W for hypernatremia while diuresing for 1 day - No extubation until clarification with surgery and neuro situation has been addressed - F/u CXR - Goal SpO2 88 to 95% - Replace K while diuresing  Adenocarcinoma of GE Junction s/p Laparoscopy with Gastrectomy. Discussion: -proximal gastrectomy with esophagogastrostomy, pyloroplasty, jejunostomy tube placement, distal pancreatectomy and splenectomy 6/2.   -completed 3 cycles of FOLFOX -concern for LUQ gas/fluid on 6/12 CT ABD > back to OR for gastrectomy -abd closed 6/18 Plan - Post op care per surgery - TCTS consulted - Do not manipulate NG tube - Continue protonix  Acute metabolic encephalopathy. Plan - RASS goal -1 to -2 - D/C PRN versed - Start precedex - Minimize fentanyl drip  Moderate Protein Calorie Malnutrition Plan - Continue TPN  Anemia of critical illness. Plan - f/u CBC - transfuse for Hb < 7 - check iron levels  HTN, HLD, CAD s/p CABG. P: - hold outpt norvasc, ASA, zetia, lisinopril, lopressor, crestor  DM with Hyperglycemia  Plan - SSI  - Continue lantus to 35 units daily on 6/21 - Add insulin to TPN to avoid further lantus at this point  Will need to discuss with surgery plans prior to pursuing aggressive weaning  Best practice:  Diet: TPN DVT prophylaxis: SQ heparin GI prophylaxis: Protonix Mobility: bed rest Code Status: Full Code  Disposition: ICU   Labs    CMP Latest Ref Rng & Units 10/21/2018 10/20/2018 10/19/2018  Glucose 70 - 99 mg/dL 279(H) 274(H) 247(H)  BUN 8 - 23 mg/dL 34(H) 30(H) 31(H)  Creatinine 0.61 - 1.24 mg/dL 1.03 1.00 1.03  Sodium 135 - 145 mmol/L 149(H) 147(H) 148(H)  Potassium 3.5 - 5.1 mmol/L 4.2 4.0 3.9  Chloride 98 - 111 mmol/L 116(H) 112(H) 112(H)  CO2 22 - 32  mmol/L 28 28 28   Calcium 8.9 - 10.3 mg/dL 7.8(L) 7.5(L) 7.4(L)  Total Protein 6.5 - 8.1 g/dL 4.4(L) - -  Total Bilirubin 0.3 - 1.2 mg/dL 0.4 - -  Alkaline Phos 38 - 126 U/L 88 - -  AST 15 - 41 U/L 19 - -  ALT 0 - 44 U/L 37 - -   CBC Latest Ref Rng & Units 10/21/2018 10/20/2018 10/19/2018  WBC 4.0 - 10.5 K/uL 14.9(H) 22.5(H) 24.1(H)  Hemoglobin 13.0 - 17.0 g/dL 7.3(L) 7.4(L) 7.2(L)  Hematocrit 39.0 - 52.0 % 24.6(L) 24.0(L) 23.1(L)  Platelets 150 - 400 K/uL 228 226 321   ABG    Component Value Date/Time   PHART 7.472 (H) 10/19/2018 0343   PCO2ART 38.8 10/19/2018 0343   PO2ART 66.0 (L) 10/19/2018 0343   HCO3 28.3 (H) 10/19/2018 0343   TCO2 29 10/19/2018 0343   ACIDBASEDEF 2.0 10/17/2018 1544   O2SAT 94.0 10/19/2018 0343   CBG (last 3)  Recent Labs    10/20/18 2309 10/21/18 0309 10/21/18 0736  GLUCAP 240* 254* 258*  The patient is critically ill with multiple organ systems failure and requires high complexity decision making for assessment and support, frequent evaluation and titration of therapies, application of advanced monitoring technologies and extensive interpretation of multiple databases.   Critical Care Time devoted to patient care services described in this note is  33  Minutes. This time reflects time of care of this signee Dr Jennet Maduro. This critical care time does not reflect procedure time, or teaching time or supervisory time of PA/NP/Med student/Med Resident etc but could involve care discussion time.  Rush Farmer, M.D. [facility] Pulmonary/Critical Care Medicine. Pager: [phone]. After hours pager: [phone].  10/21/2018, 8:01 AM

## 2018-10-21 NOTE — Progress Notes (Signed)
[facility] DAILY ICU PROGRESS NOTE                   [address]            [place] [address]          [phone]   4 Days Post-Op Procedure(s) (LRB): RE-OPEN  LAPAROTOMY (N/A)  Total Length of Stay:  LOS: 20 days   Subjective: Intubated/sedated. Non-responsive.   Objective: Vital signs in last 24 hours: Temp:  [98.8 F (37.1 C)-102.3 F (39.1 C)] 102.3 F (39.1 C) (06/22 0800) Pulse Rate:  [71-97] 81 (06/22 1030) Cardiac Rhythm: Normal sinus rhythm (06/22 0000) Resp:  [0-29] 21 (06/22 1030) BP: (86-150)/(42-79) 102/48 (06/22 1030) SpO2:  [90 %-100 %] 92 % (06/22 1030) FiO2 (%):  [40 %] 40 % (06/22 0726) Weight:  [101.9 kg-102 kg] 101.9 kg (06/22 0353)  Filed Weights   10/14/18 1957 10/20/18 1500 10/21/18 0353  Weight: 94.9 kg 102 kg 101.9 kg    Weight change:       Intake/Output from previous day: 06/21 0701 - 06/22 0700 In: 3024.4 [I.V.:2049.6; IV Piggyback:899.8] Out: 4260 [Urine:3250; Drains:1010]  Intake/Output this shift: Total I/O In: 374.4 [I.V.:323.6; IV Piggyback:50.9] Out: 1160 [Urine:1035; Drains:125]  Current Meds: Scheduled Meds:  chlorhexidine gluconate (MEDLINE KIT)  15 mL Mouth Rinse BID   Chlorhexidine Gluconate Cloth  6 each Topical Daily   Chlorhexidine Gluconate Cloth  6 each Topical Daily   furosemide  40 mg Intravenous Q6H   heparin injection (subcutaneous)  5,000 Units Subcutaneous Q8H   insulin aspart  0-20 Units Subcutaneous Q4H   mouth rinse  15 mL Mouth Rinse 10 times per day   pantoprazole  40 mg Intravenous Q12H   sodium chloride flush  10-40 mL Intracatheter Q12H   Continuous Infusions:  sodium chloride Stopped (10/21/18 0938)   anidulafungin Stopped (10/21/18 0511)   ceFEPime (MAXIPIME) IV Stopped (10/21/18 0710)   dexmedetomidine (PRECEDEX) IV infusion     dextrose 50 mL/hr at 10/21/18 1000   fentaNYL infusion INTRAVENOUS 150 mcg/hr (10/21/18 1017)   metronidazole Stopped (10/21/18 6384)    potassium PHOSPHATE IVPB (in mmol) 85 mL/hr at 10/21/18 1000   TPN ADULT (ION) 75 mL/hr at 10/21/18 1000   TPN ADULT (ION)     PRN Meds:.sodium chloride, acetaminophen, albuterol, fentaNYL, midazolam, sodium chloride flush  General appearance: intubated/sedative. Non-responsive Cor: RRR Pulm: coarse vent sounds and wet sounding airway Abd: diminished bowel sounds Ext: 2-3+ pitting edema in lower and upper extremity  Lab Results: CBC: Recent Labs    10/20/18 0544 10/21/18 0455  WBC 22.5* 14.9*  HGB 7.4* 7.3*  HCT 24.0* 24.6*  PLT 226 228   BMET:  Recent Labs    10/20/18 0544 10/21/18 0455  NA 147* 149*  K 4.0 4.2  CL 112* 116*  CO2 28 28  GLUCOSE 274* 279*  BUN 30* 34*  CREATININE 1.00 1.03  CALCIUM 7.5* 7.8*    CMET: Lab Results  Component Value Date   WBC 14.9 (H) 10/21/2018   HGB 7.3 (L) 10/21/2018   HCT 24.6 (L) 10/21/2018   PLT 228 10/21/2018   GLUCOSE 279 (H) 10/21/2018   CHOL 104 11/16/2017   TRIG 77 10/21/2018   HDL 39.60 11/16/2017   LDLCALC 45 11/16/2017   ALT 37 10/21/2018   AST 19 10/21/2018   NA 149 (H) 10/21/2018   K 4.2 10/21/2018   CL 116 (H) 10/21/2018   CREATININE 1.03 10/21/2018  BUN 34 (H) 10/21/2018   CO2 28 10/21/2018   PSA 1.36 08/03/2009   INR 1.4 (H) 10/16/2018   HGBA1C 5.8 (H) 09/25/2018   MICROALBUR 1.8 07/09/2009      PT/INR: No results for input(s): LABPROT, INR in the last 72 hours. Radiology: Dg Chest Port 1 View  Result Date: 10/21/2018 CLINICAL DATA:  Respiratory failure. EXAM: PORTABLE CHEST 1 VIEW COMPARISON:  Radiograph October 20, 2018. FINDINGS: Stable cardiomediastinal silhouette. Endotracheal and nasogastric tubes are unchanged in position. Left internal jugular catheter is unchanged. Right internal jugular Port-A-Cath is unchanged. No pneumothorax is noted. Increased bilateral perihilar and basilar opacities are noted concerning for worsening edema or pneumonia. Small left pleural effusion is noted. Bony  thorax is unremarkable. IMPRESSION: Stable support apparatus. Increased bilateral perihilar and basilar opacities are noted concerning for worsening edema or pneumonia. Small left pleural effusion is noted. Electronically Signed   By: Marijo Conception M.D.   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 Stark Klein POD #16  -UGI 10/08/2018 shows mild posterior leak. -acute respiratory distress, intubated, coded x 2, placed on 3 pressors -takeback 6/17 with resection of necrotic stomach, open end of distal esophagus, repair SB enterotomy; abthera vac Dr Barry Dienes -takeback 6/18 with washout and placement of drains, closure of fascia.Dr Barry Dienes  1. Shock-secondary to stomach necrosis, peritonitis and aspiration PNA. Off pressor support. Continue broad spectrum abx and antifungal 2. PEA arrest on 6/17 x 2 3. Acute respiratory failure- possibly due to aspiration. PEEP of 5. FiO2 of 40%. Sedation and Fentanyl gtt. Beginning PS trials per CCM 4. GI-bowel rest. Per Surgery may start J tube feeds next week currently on TPN  Hopefully back to OR to create an esophageal conduit in 6 weeks per Surgery depending on the patient's recovery/progress.   Elgie Collard 10/21/2018 11:00 AM

## 2018-10-21 NOTE — Progress Notes (Signed)
4 Days Post-Op      Subjective: Sedated on the vent.  BP down some with Lasix. Better with 500 cc NS bolus.   He has green bilious drainage from all the drains, JP is a little more cloudy than the G tube.      Objective: Vital signs in last 24 hours: Temp:  [98.8 F (37.1 C)-102.3 F (39.1 C)] 102.3 F (39.1 C) (06/22 0800) Pulse Rate:  [69-97] 69 (06/22 1132) Resp:  [0-29] 24 (06/22 1132) BP: (83-150)/(40-79) 83/40 (06/22 1132) SpO2:  [90 %-100 %] 95 % (06/22 1132) FiO2 (%):  [40 %] 40 % (06/22 1132) Weight:  [101.9 kg-102 kg] 101.9 kg (06/22 0353) Last BM Date: 10/15/18 3024 IV 3250 urine 0 NG Drain 1010 Temp 102.3 at 7 AM BP down around 11 AM after 40 IV lasix Labs CMP stable WBC improving CMP Latest Ref Rng & Units 10/21/2018 10/20/2018 10/19/2018  Glucose 70 - 99 mg/dL 279(H) 274(H) 247(H)  BUN 8 - 23 mg/dL 34(H) 30(H) 31(H)  Creatinine 0.61 - 1.24 mg/dL 1.03 1.00 1.03  Sodium 135 - 145 mmol/L 149(H) 147(H) 148(H)  Potassium 3.5 - 5.1 mmol/L 4.2 4.0 3.9  Chloride 98 - 111 mmol/L 116(H) 112(H) 112(H)  CO2 22 - 32 mmol/L _0 Calcium 8.9 - 10.3 mg/dL 7.8(L) 7.5(L) 7.4(L)  Total Protein 6.5 - 8.1 g/dL 4.4(L) - -  Total Bilirubin 0.3 - 1.2 mg/dL 0.4 - -  Alkaline Phos 38 - 126 U/L 88 - -  AST 15 - 41 U/L 19 - -  ALT 0 - 44 U/L 37 - -   CBC Latest Ref Rng & Units 10/21/2018 10/20/2018 10/19/2018  WBC 4.0 - 10.5 K/uL 14.9(H) 22.5(H) 24.1(H)  Hemoglobin 13.0 - 17.0 g/dL 7.3(L) 7.4(L) 7.2(L)  Hematocrit 39.0 - 52.0 % 24.6(L) 24.0(L) 23.1(L)  Platelets 150 - 400 K/uL 228 226 321   Intake/Output from previous day: 06/21 0701 - 06/22 0700 In: 3024.4 [I.V.:2049.6; IV Piggyback:899.8] Out: 4260 [Urine:3250; Drains:1010] Intake/Output this shift: Total I/O In: 612.2 [I.V.:476.3; IV Piggyback:135.9] Out: 1385 [Urine:1260; Drains:125]  General appearance: sedated on the Vent.  Resp: clear to auscultation bilaterally and anteriror exam GI: wound vac in place, 4 JP  drains with green-white fluid, a bit thick in appearance.  G-tube dark green bilious fluid. Extremities: +2 edema both lets and edema both arms  Lipase  No results found for: LIPASE   Medications: . chlorhexidine gluconate (MEDLINE KIT)  15 mL Mouth Rinse BID  . Chlorhexidine Gluconate Cloth  6 each Topical Daily  . Chlorhexidine Gluconate Cloth  6 each Topical Daily  . furosemide  40 mg Intravenous Q6H  . heparin injection (subcutaneous)  5,000 Units Subcutaneous Q8H  . insulin aspart  0-20 Units Subcutaneous Q4H  . mouth rinse  15 mL Mouth Rinse 10 times per day  . pantoprazole  40 mg Intravenous Q12H  . sodium chloride flush  10-40 mL Intracatheter Q12H   . sodium chloride 500 mL (10/21/18 1204)  . anidulafungin Stopped (10/21/18 0511)  . ceFEPime (MAXIPIME) IV Stopped (10/21/18 0710)  . dexmedetomidine (PRECEDEX) IV infusion 0.4 mcg/kg/hr (10/21/18 1217)  . dextrose 50 mL/hr at 10/21/18 1100  . fentaNYL infusion INTRAVENOUS 125 mcg/hr (10/21/18 1100)  . metronidazole 500 mg (10/21/18 1206)  . phenylephrine (NEO-SYNEPHRINE) Adult infusion    . potassium PHOSPHATE IVPB (in mmol) 85 mL/hr at 10/21/18 1100  . TPN ADULT (ION) 75 mL/hr at 10/21/18 1100  . TPN ADULT (ION)  Anti-infectives (From admission, onward)   Start     Dose/Rate Route Frequency Ordered Stop   10/18/18 1300  metroNIDAZOLE (FLAGYL) IVPB 500 mg     500 mg 100 mL/hr over 60 Minutes Intravenous Every 8 hours 10/18/18 1230     10/18/18 1130  ceFEPIme (MAXIPIME) 2 g in sodium chloride 0.9 % 100 mL IVPB     2 g 200 mL/hr over 30 Minutes Intravenous Every 8 hours 10/18/18 1049     10/18/18 0300  anidulafungin (ERAXIS) 100 mg in sodium chloride 0.9 % 100 mL IVPB    Note to Pharmacy: Gross gastric contamination.  Coverage for yeast.   100 mg 78 mL/hr over 100 Minutes Intravenous Every 24 hours 10/17/18 0118     10/17/18 0130  anidulafungin (ERAXIS) 200 mg in sodium chloride 0.9 % 200 mL IVPB     200 mg 78 mL/hr  over 200 Minutes Intravenous  Once 10/17/18 0119 10/17/18 0633   10/16/18 2342  vancomycin variable dose per unstable renal function (pharmacist dosing)  Status:  Discontinued      Does not apply See admin instructions 10/16/18 2342 10/18/18 0822   10/12/18 1800  vancomycin (VANCOCIN) 2,000 mg in sodium chloride 0.9 % 500 mL IVPB  Status:  Discontinued     2,000 mg 250 mL/hr over 120 Minutes Intravenous Every 24 hours 10/12/18 0958 10/16/18 2342   10/09/18 1800  vancomycin (VANCOCIN) 1,500 mg in sodium chloride 0.9 % 500 mL IVPB  Status:  Discontinued     1,500 mg 250 mL/hr over 120 Minutes Intravenous Every 24 hours 10/08/18 1648 10/12/18 0957   10/09/18 0200  piperacillin-tazobactam (ZOSYN) IVPB 3.375 g  Status:  Discontinued     3.375 g 12.5 mL/hr over 240 Minutes Intravenous Every 8 hours 10/08/18 1648 10/18/18 1040   10/08/18 1700  vancomycin (VANCOCIN) 1,500 mg in sodium chloride 0.9 % 500 mL IVPB     1,500 mg 250 mL/hr over 120 Minutes Intravenous  Once 10/08/18 1648 10/08/18 1924   10/08/18 1645  piperacillin-tazobactam (ZOSYN) IVPB 3.375 g     3.375 g 100 mL/hr over 30 Minutes Intravenous  Once 10/08/18 1638 10/08/18 1749   10/01/18 1600  ceFAZolin (ANCEF) IVPB 2g/100 mL premix     2 g 200 mL/hr over 30 Minutes Intravenous Every 8 hours 10/01/18 1539 10/01/18 1759   10/01/18 0631  ceFAZolin (ANCEF) 2-4 GM/100ML-% IVPB    Note to Pharmacy: Marga Melnick   : cabinet override      10/01/18 0631 10/01/18 0814   10/01/18 0630  ceFAZolin (ANCEF) IVPB 2g/100 mL premix     2 g 200 mL/hr over 30 Minutes Intravenous On call to O.R. 10/01/18 1829 10/01/18 0814      Assessment/Plan Acute respiratory failure 10/16/18 - intubated PEA arrest 10/16/18- 8 min CPR/ROSC Aspiration pneumonia Acute metabolic encephalopathy Moderate PCM Anemia Hx hypertension Hx CABG Hx type II diabetes   Adenocarcinoma the gastric cardia, pancreatic neuroendocrine tumor 1.  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 Stark Klein.  - UGI 10/08/2018 shows mild posterior leak- feeding via J tube 2.  Reopening of recent laparotomy and completion gastrectomy, placement of Abthera wound vac 10/16/18 Dr.Byerly 3.  Reopening of recent laparotomy, removal of Abthera wound vac, placement of drains, closure of fascia 10/17/18, Dr. Barry Dienes  FEN:  NPO/IV fluids/TPN ID:  anidulafungin 6/19 >> day 4  Cefepime 6/19 >> day 4; Flagyl 6/19 >>  day 4 DVT:  SCD  Plan:  Ongoing support.      LOS: 20 days    Velna Hedgecock 10/21/2018 [phone]

## 2018-10-21 NOTE — Progress Notes (Signed)
Pt hypotensive, BP 80/42. Paused fentanyl and precedex. Lavenia Atlas, NP. See associated orders.

## 2018-10-22 ENCOUNTER — Inpatient Hospital Stay (HOSPITAL_COMMUNITY): Payer: Medicare Other

## 2018-10-22 DIAGNOSIS — I959 Hypotension, unspecified: Secondary | ICD-10-CM

## 2018-10-22 LAB — POCT I-STAT 7, (LYTES, BLD GAS, ICA,H+H)
Acid-Base Excess: 4 mmol/L — ABNORMAL HIGH (ref 0.0–2.0)
Bicarbonate: 27.9 mmol/L (ref 20.0–28.0)
Calcium, Ion: 1.25 mmol/L (ref 1.15–1.40)
HCT: 39 % (ref 39.0–52.0)
Hemoglobin: 13.3 g/dL (ref 13.0–17.0)
O2 Saturation: 94 %
Patient temperature: 98
Potassium: 3.6 mmol/L (ref 3.5–5.1)
Sodium: 148 mmol/L — ABNORMAL HIGH (ref 135–145)
TCO2: 29 mmol/L (ref 22–32)
pCO2 arterial: 36.8 mmHg (ref 32.0–48.0)
pH, Arterial: 7.487 — ABNORMAL HIGH (ref 7.350–7.450)
pO2, Arterial: 63 mmHg — ABNORMAL LOW (ref 83.0–108.0)

## 2018-10-22 LAB — GLUCOSE, CAPILLARY
Glucose-Capillary: 223 mg/dL — ABNORMAL HIGH (ref 70–99)
Glucose-Capillary: 190 mg/dL — ABNORMAL HIGH (ref 70–99)
Glucose-Capillary: 256 mg/dL — ABNORMAL HIGH (ref 70–99)
Glucose-Capillary: 275 mg/dL — ABNORMAL HIGH (ref 70–99)
Glucose-Capillary: 252 mg/dL — ABNORMAL HIGH (ref 70–99)
Glucose-Capillary: 203 mg/dL — ABNORMAL HIGH (ref 70–99)

## 2018-10-22 LAB — BASIC METABOLIC PANEL
Anion gap: 9 (ref 5–15)
BUN: 34 mg/dL — ABNORMAL HIGH (ref 8–23)
CO2: 24 mmol/L (ref 22–32)
Calcium: 7.6 mg/dL — ABNORMAL LOW (ref 8.9–10.3)
Chloride: 115 mmol/L — ABNORMAL HIGH (ref 98–111)
Creatinine, Ser: 1.23 mg/dL (ref 0.61–1.24)
GFR calc Af Amer: >60 mL/min (ref >60)
GFR calc non Af Amer: 56 mL/min — ABNORMAL LOW (ref >60)
Glucose, Bld: 221 mg/dL — ABNORMAL HIGH (ref 70–99)
Potassium: 3.6 mmol/L (ref 3.5–5.1)
Sodium: 148 mmol/L — ABNORMAL HIGH (ref 135–145)

## 2018-10-22 LAB — MAGNESIUM: Magnesium: 1.9 mg/dL (ref 1.7–2.4)

## 2018-10-22 LAB — PHOSPHORUS: Phosphorus: 2.2 mg/dL — ABNORMAL LOW (ref 2.5–4.6)

## 2018-10-22 MED ORDER — POTASSIUM CHLORIDE 20 MEQ/15ML (10%) PO SOLN: 40.0000 meq | Freq: Three times a day (TID) | ORAL | Status: DC

## 2018-10-22 MED ORDER — ALTEPLASE 2 MG IJ SOLR
2.0000 mg | Freq: Once | INTRAMUSCULAR | Status: AC
  Administered 2018-10-22: 2 mg
  Filled 2018-10-22: qty 2

## 2018-10-22 MED ORDER — FUROSEMIDE 10 MG/ML IJ SOLN
40.0000 mg | Freq: Four times a day (QID) | INTRAMUSCULAR | Status: AC
  Administered 2018-10-22 (×3): 40 mg via INTRAVENOUS
  Filled 2018-10-22 (×3): qty 4

## 2018-10-22 MED ORDER — FENTANYL CITRATE (PF) 100 MCG/2ML IJ SOLN
50.0000 ug | INTRAMUSCULAR | Status: DC | PRN
  Administered 2018-10-22 – 2018-10-26 (×2): 50 ug via INTRAVENOUS
  Filled 2018-10-22: qty 2

## 2018-10-22 MED ORDER — POTASSIUM PHOSPHATES 15 MMOLE/5ML IV SOLN
30.0000 mmol | Freq: Once | INTRAVENOUS | Status: AC
  Administered 2018-10-22: 30 mmol via INTRAVENOUS
  Filled 2018-10-22: qty 10

## 2018-10-22 MED ORDER — FENTANYL CITRATE (PF) 100 MCG/2ML IJ SOLN
INTRAMUSCULAR | Status: AC
  Filled 2018-10-22: qty 2

## 2018-10-22 MED ORDER — DEXTROSE 5 % IV SOLN
INTRAVENOUS | Status: AC
  Administered 2018-10-22 – 2018-10-23 (×2): via INTRAVENOUS

## 2018-10-22 MED ORDER — STERILE WATER FOR INJECTION IV SOLN
INTRAVENOUS | Status: AC
  Administered 2018-10-22: 18:00:00 via INTRAVENOUS
  Filled 2018-10-22: qty 960

## 2018-10-22 MED ORDER — SODIUM CHLORIDE 0.9 % IV SOLN
INTRAVENOUS | Status: DC | PRN
  Administered 2018-10-22: 12:00:00 500 mL via INTRAVENOUS

## 2018-10-22 MED ORDER — STERILE WATER FOR INJECTION IV SOLN: INTRAVENOUS | Status: DC

## 2018-10-22 MED ORDER — POTASSIUM CHLORIDE 10 MEQ/50ML IV SOLN
10.0000 meq | INTRAVENOUS | Status: AC
  Administered 2018-10-22 (×6): 10 meq via INTRAVENOUS
  Filled 2018-10-22 (×6): qty 50

## 2018-10-22 NOTE — Progress Notes (Addendum)
[facility] CONSULT NOTE   Pharmacy Consult for TPN Indication: post-gastrectomy  Patient Measurements: Height: 5\' 9"  (175.3 cm) Weight: 224 lb 10.4 oz (101.9 kg) IBW/kg (Calculated) : 70.7 TPN AdjBW (KG): 76.7 Body mass index is 33.17 kg/m. Usual Weight: ~220 lbs  Assessment:  78 yo M admitted on 6/2 for laparoscopy in the setting of known adenocarcinoma of the GE junction. S/p proximal gastrectomy with esophagogastrostomy, pyloroplasty, jejunostomy tube placement, distal pancreatectomy and splenectomy. Developed respiratory distress requiring intubation and a PEA arrest on 6/17. Repeat abdominal imaging 6/17 with concern for stomach necrosis. S/p complete gastrectomy and VAC change 6/18. Has been receiving tube feeds since 6/4 (stopped 6/18) and has moderate protein caloric malnutrition.   GI: TF 6/4>>6/18, albumin 1.8, prealbumin 5.3 Endo: Hx DM and now s/p distal pancreatectomy. Prior to admission on Lantus 40 daily + Novolog ~30 units daily; Lantus discontinued. Insulin requirements in the past 24 hours: 57 units Novolog  Lytes: K 3.6, coCa 9.7, Na 148, Cl 109 > 115, phos 2.2; Mg 1.9. MD ordered Kphos for today. Renal: AKI resolving. SCr down 1, UOP 1.3 ml/kg/hr.  Pulm: intubated 6/17 for acute hypoxemic respiratory failure, possible aspiration, moderate ARDS. Cards: s/p PEA arrest on 6/17. MAP 71 (using cuff as aline not working) and off pressors this AM. Hepatobil: AST/ALT 54/36, tbili wnl Neuro: sedated on fentanyl at 100 and versed at 2. RASS -3.  ID: abx for aspiration PNA and peritonitis- Ecoli intermediate to Zosyn>> changed to Cefepime + Flagyl and continuing Eraxis. WBC 24 >14, Tm 99  TPN Access: central line placed 6/17 TPN start date: 6/18 Nutritional Goals (per RD recommendations on 6/18) KCal: 2438 kcal Protein: 135-175 g Fluid: >2,000 mL   Current Nutrition: tube feeds off 6/17, NPO, TPN initiated 6/18  Plan:  -Increase TPN to  75 mL/hr -This TPN provides 126 g of protein, 270 g of dextrose, and 61 g of lipids which provides 2106 kCals per day, meeting ~86% of kcal needs and 100% of protein needs -Electrolytes in TPN: reduce Na, increase Phos, increase Mg, chloride/acetate 1:2  -Add MVI to TPN -Trace elements to be added only MWF due to national shortage -Increase regular insulin to 50 units in bag to due increased dextrose in the bag -Electrolyte replacement per MD today -Patient has d5w running, which is contributing to his glucose fluctuations  [name] 10/22/2018 9:10 AM

## 2018-10-22 NOTE — Progress Notes (Signed)
5 Days Post-Op   Subjective/Chief Complaint: On vent not waking up much    Objective: Vital signs in last 24 hours: Temp:  [99.2 F (37.3 C)-100.2 F (37.9 C)] 100.2 F (37.9 C) (06/23 0400) Pulse Rate:  [55-96] 58 (06/23 0733) Resp:  [18-32] 30 (06/23 0733) BP: (70-148)/(39-66) 117/50 (06/23 0733) SpO2:  [90 %-100 %] 96 % (06/23 0733) FiO2 (%):  [40 %] 40 % (06/23 0733) Last BM Date: 10/15/18  Intake/Output from previous day: 06/22 0701 - 06/23 0700 In: 5225.4 [I.V.:4071.2; IV Piggyback:1154.2] Out: 5750 [Urine:5150; Drains:600] Intake/Output this shift: Total I/O In: -  Out: 125 [Urine:125]  Incision/Wound:vac in place Drains with bilious type drainage   Lab Results:  Recent Labs    10/21/18 0455 10/22/18 0256 10/22/18 0432  WBC 14.9*  --  15.5*  HGB 7.3* 13.3 7.9*  HCT 24.6* 39.0 27.0*  PLT 228  --  PLATELET CLUMPS NOTED ON SMEAR, UNABLE TO ESTIMATE   BMET Recent Labs    10/21/18 0455 10/22/18 0256 10/22/18 0432  NA 149* 148* 148*  K 4.2 3.6 3.6  CL 116*  --  115*  CO2 28  --  24  GLUCOSE 279*  --  221*  BUN 34*  --  34*  CREATININE 1.03  --  1.23  CALCIUM 7.8*  --  7.6*   PT/INR No results for input(s): LABPROT, INR in the last 72 hours. ABG Recent Labs    10/22/18 0256  PHART 7.487*  HCO3 27.9    Studies/Results: Dg Chest Port 1 View  Result Date: 10/21/2018 CLINICAL DATA:  Respiratory failure. EXAM: PORTABLE CHEST 1 VIEW COMPARISON:  Radiograph October 20, 2018. FINDINGS: Stable cardiomediastinal silhouette. Endotracheal and nasogastric tubes are unchanged in position. Left internal jugular catheter is unchanged. Right internal jugular Port-A-Cath is unchanged. No pneumothorax is noted. Increased bilateral perihilar and basilar opacities are noted concerning for worsening edema or pneumonia. Small left pleural effusion is noted. Bony thorax is unremarkable. IMPRESSION: Stable support apparatus. Increased bilateral perihilar and basilar opacities  are noted concerning for worsening edema or pneumonia. Small left pleural effusion is noted. Electronically Signed   By: Marijo Conception M.D.   On: 10/21/2018 07:12    Anti-infectives: Anti-infectives (From admission, onward)   Start     Dose/Rate Route Frequency Ordered Stop   10/18/18 1300  metroNIDAZOLE (FLAGYL) IVPB 500 mg     500 mg 100 mL/hr over 60 Minutes Intravenous Every 8 hours 10/18/18 1230     10/18/18 1130  ceFEPIme (MAXIPIME) 2 g in sodium chloride 0.9 % 100 mL IVPB     2 g 200 mL/hr over 30 Minutes Intravenous Every 8 hours 10/18/18 1049     10/18/18 0300  anidulafungin (ERAXIS) 100 mg in sodium chloride 0.9 % 100 mL IVPB    Note to Pharmacy: Gross gastric contamination.  Coverage for yeast.   100 mg 78 mL/hr over 100 Minutes Intravenous Every 24 hours 10/17/18 0118     10/17/18 0130  anidulafungin (ERAXIS) 200 mg in sodium chloride 0.9 % 200 mL IVPB     200 mg 78 mL/hr over 200 Minutes Intravenous  Once 10/17/18 0119 10/17/18 0633   10/16/18 2342  vancomycin variable dose per unstable renal function (pharmacist dosing)  Status:  Discontinued      Does not apply See admin instructions 10/16/18 2342 10/18/18 0822   10/12/18 1800  vancomycin (VANCOCIN) 2,000 mg in sodium chloride 0.9 % 500 mL IVPB  Status:  Discontinued  2,000 mg 250 mL/hr over 120 Minutes Intravenous Every 24 hours 10/12/18 0958 10/16/18 2342   10/09/18 1800  vancomycin (VANCOCIN) 1,500 mg in sodium chloride 0.9 % 500 mL IVPB  Status:  Discontinued     1,500 mg 250 mL/hr over 120 Minutes Intravenous Every 24 hours 10/08/18 1648 10/12/18 0957   10/09/18 0200  piperacillin-tazobactam (ZOSYN) IVPB 3.375 g  Status:  Discontinued     3.375 g 12.5 mL/hr over 240 Minutes Intravenous Every 8 hours 10/08/18 1648 10/18/18 1040   10/08/18 1700  vancomycin (VANCOCIN) 1,500 mg in sodium chloride 0.9 % 500 mL IVPB     1,500 mg 250 mL/hr over 120 Minutes Intravenous  Once 10/08/18 1648 10/08/18 1924   10/08/18  1645  piperacillin-tazobactam (ZOSYN) IVPB 3.375 g     3.375 g 100 mL/hr over 30 Minutes Intravenous  Once 10/08/18 1638 10/08/18 1749   10/01/18 1600  ceFAZolin (ANCEF) IVPB 2g/100 mL premix     2 g 200 mL/hr over 30 Minutes Intravenous Every 8 hours 10/01/18 1539 10/01/18 1759   10/01/18 0631  ceFAZolin (ANCEF) 2-4 GM/100ML-% IVPB    Note to Pharmacy: Marga Melnick   : cabinet override      10/01/18 0631 10/01/18 0814   10/01/18 0630  ceFAZolin (ANCEF) IVPB 2g/100 mL premix     2 g 200 mL/hr over 30 Minutes Intravenous On call to O.R. 10/01/18 2563 10/01/18 0814      Assessment/Plan: s/p Procedure(s): RE-OPEN  LAPAROTOMY (N/A) Acute respiratory failure 10/16/18 - intubated PEA arrest 10/16/18- 8 min CPR/ROSC Aspiration pneumonia Acute metabolic encephalopathy Moderate PCM Anemia Hx hypertension Hx CABG Hx type II diabetes   Adenocarcinoma the gastric cardia, pancreatic neuroendocrine tumor 1.  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 Stark Klein.  - UGI 10/08/2018 shows mild posterior leak- feeding via J tube 2.  Reopening of recent laparotomy and completion gastrectomy, placement of Abthera wound vac 10/16/18 Dr.Byerly 3.  Reopening of recent laparotomy, removal of Abthera wound vac, placement of drains, closure of fascia 10/17/18, Dr. Barry Dienes  FEN:  NPO/IV fluids/TPN ID:  anidulafungin 6/19 >> day 4  Cefepime 6/19 >> day 4; Flagyl 6/19 >>day 4 DVT:  SCD Tried to call daughter No answer  Will attempt later if able  Plan:  Ongoing support.    LOS: 21 days    Joyice Faster Leler Brion 10/22/2018

## 2018-10-22 NOTE — Progress Notes (Signed)
TCTS DAILY ICU PROGRESS NOTE                   [address]            [place] [address]          [phone]   5 Days Post-Op Procedure(s) (LRB): RE-OPEN  LAPAROTOMY (N/A)  Total Length of Stay:  LOS: 21 days   Subjective: Remains sedated on vent, nursing reports he doesn't respond much  Objective: Vital signs in last 24 hours: Temp:  [99.2 F (37.3 C)-100.5 F (38.1 C)] 100.5 F (38.1 C) (06/23 0800) Pulse Rate:  [55-96] 57 (06/23 0900) Cardiac Rhythm: Normal sinus rhythm;Sinus bradycardia (06/23 0800) Resp:  [18-32] 29 (06/23 0900) BP: (70-148)/(39-66) 122/51 (06/23 0900) SpO2:  [91 %-100 %] 97 % (06/23 0900) FiO2 (%):  [40 %] 40 % (06/23 0733)  Filed Weights   10/14/18 1957 10/20/18 1500 10/21/18 0353  Weight: 94.9 kg 102 kg 101.9 kg    Weight change:     Vent Mode: PRVC FiO2 (%):  [40 %] 40 % Set Rate:  [20 bmp] 20 bmp Vt Set:  [560 mL] 560 mL PEEP:  [5 cmH20] 5 cmH20 Plateau Pressure:  [18 cmH20-21 cmH20] 18 cmH20 Hemodynamic parameters for last 24 hours:    Intake/Output from previous day: 06/22 0701 - 06/23 0700 In: 5225.4 [I.V.:4071.2; IV Piggyback:1154.2] Out: 5750 [Urine:5150; Drains:600]  Intake/Output this shift: Total I/O In: 299.5 [I.V.:161.3; IV Piggyback:138.2] Out: 125 [Urine:125]  Current Meds: Scheduled Meds: . chlorhexidine gluconate (MEDLINE KIT)  15 mL Mouth Rinse BID  . Chlorhexidine Gluconate Cloth  6 each Topical Daily  . Chlorhexidine Gluconate Cloth  6 each Topical Daily  . heparin injection (subcutaneous)  5,000 Units Subcutaneous Q8H  . insulin aspart  0-20 Units Subcutaneous Q4H  . mouth rinse  15 mL Mouth Rinse 10 times per day  . pantoprazole  40 mg Intravenous Q12H  . sodium chloride flush  10-40 mL Intracatheter Q12H   Continuous Infusions: . sodium chloride Stopped (10/22/18 0730)  . anidulafungin Stopped (10/22/18 4765)  . ceFEPime (MAXIPIME) IV 200 mL/hr at 10/22/18 0800  . dexmedetomidine  (PRECEDEX) IV infusion 0.7 mcg/kg/hr (10/22/18 0800)  . fentaNYL infusion INTRAVENOUS Stopped (10/21/18 1150)  . metronidazole Stopped (10/22/18 0728)  . phenylephrine (NEO-SYNEPHRINE) Adult infusion 15 mcg/min (10/22/18 0800)  . TPN ADULT (ION) 75 mL/hr at 10/22/18 0800   PRN Meds:.sodium chloride, acetaminophen, albuterol, fentaNYL, midazolam, sodium chloride flush  General appearance: sedated, no response to verbal stimuli Heart: regular rate and rhythm and brady Lungs: dim in lower fields Abdomen: soft Extremities: + BLE edema Wound: Abdominal vac in place  Lab Results: CBC: Recent Labs    10/21/18 0455 10/22/18 0256 10/22/18 0432  WBC 14.9*  --  15.5*  HGB 7.3* 13.3 7.9*  HCT 24.6* 39.0 27.0*  PLT 228  --  PLATELET CLUMPS NOTED ON SMEAR, UNABLE TO ESTIMATE   BMET:  Recent Labs    10/21/18 0455 10/22/18 0256 10/22/18 0432  NA 149* 148* 148*  K 4.2 3.6 3.6  CL 116*  --  115*  CO2 28  --  24  GLUCOSE 279*  --  221*  BUN 34*  --  34*  CREATININE 1.03  --  1.23  CALCIUM 7.8*  --  7.6*    CMET: Lab Results  Component Value Date   WBC 15.5 (H) 10/22/2018   HGB 7.9 (L) 10/22/2018   HCT 27.0 (L) 10/22/2018  PLT PLATELET CLUMPS NOTED ON SMEAR, UNABLE TO ESTIMATE 10/22/2018   GLUCOSE 221 (H) 10/22/2018   CHOL 104 11/16/2017   TRIG 77 10/21/2018   HDL 39.60 11/16/2017   LDLCALC 45 11/16/2017   ALT 37 10/21/2018   AST 19 10/21/2018   NA 148 (H) 10/22/2018   K 3.6 10/22/2018   CL 115 (H) 10/22/2018   CREATININE 1.23 10/22/2018   BUN 34 (H) 10/22/2018   CO2 24 10/22/2018   PSA 1.36 08/03/2009   INR 1.4 (H) 10/16/2018   HGBA1C 5.8 (H) 09/25/2018   MICROALBUR 1.8 07/09/2009      PT/INR: No results for input(s): LABPROT, INR in the last 72 hours. Radiology: Dg Chest Port 1 View  Result Date: 10/22/2018 CLINICAL DATA:  78 year old male with respiratory failure. Laparotomy and completion gastrectomy earlier this month for adenocarcinoma of the gastric  cardia, pancreatic neuroendocrine tumor EXAM: PORTABLE CHEST 1 VIEW COMPARISON:  10/21/2018 and earlier. FINDINGS: Portable AP semi upright view at 0509 hours. Stable endotracheal tube tip at the level the clavicles. Stable enteric tube, tip at the level of the distal esophagus. Stable right chest porta cath, accessed. Stable left IJ central line. Mediastinal contours remain normal. Continued confluent bilateral perihilar opacity. Mildly improved lung base ventilation since 10/16/2018 when confluent bilateral lower lobe consolidation was demonstrated by CT. No areas of worsening ventilation. No pneumothorax. Paucity of bowel gas in the upper abdomen. IMPRESSION: 1. Stable lines and tubes. 2. Mildly improved lung base ventilation since 10/15/2008, otherwise stable ventilation. Electronically Signed   By: Genevie Ann M.D.   On: 10/22/2018 08:11   Results for orders placed or performed during the hospital encounter of 10/01/18  Culture, blood (Routine X 2) w Reflex to ID Panel     Status: None   Collection Time: 10/07/18  9:09 AM   Specimen: BLOOD  Result Value Ref Range Status   Specimen Description BLOOD LEFT ANTECUBITAL  Final   Special Requests   Final    BOTTLES DRAWN AEROBIC ONLY Blood Culture adequate volume   Culture   Final    NO GROWTH 5 DAYS Performed at [facility], [place] [address] [place], [place] [address]    Report Status 10/12/2018 FINAL  Final  Culture, blood (Routine X 2) w Reflex to ID Panel     Status: None   Collection Time: 10/07/18  9:15 AM   Specimen: BLOOD  Result Value Ref Range Status   Specimen Description BLOOD LEFT ANTECUBITAL  Final   Special Requests   Final    BOTTLES DRAWN AEROBIC ONLY Blood Culture adequate volume   Culture   Final    NO GROWTH 5 DAYS Performed at [facility], [place] [address], [place], [place] [address]    Report Status 10/12/2018 FINAL  Final  Culture, Urine     Status: None   Collection Time: 10/07/18  9:32 AM   Specimen: Urine,  Random  Result Value Ref Range Status   Specimen Description URINE, RANDOM  Final   Special Requests NONE  Final   Culture   Final    NO GROWTH Performed at [facility], [place] [address], [place], [place] [address]    Report Status 10/08/2018 FINAL  Final  Culture, blood (Routine X 2) w Reflex to ID Panel     Status: None   Collection Time: 10/16/18 11:13 AM   Specimen: BLOOD  Result Value Ref Range Status   Specimen Description BLOOD RIGHT  ANTECUBITAL  Final   Special Requests   Final    BOTTLES DRAWN AEROBIC AND ANAEROBIC Blood Culture adequate volume   Culture   Final    NO GROWTH 5 DAYS Performed at [facility], [address], [place], [place] [address]    Report Status 10/21/2018 FINAL  Final  Culture, respiratory (non-expectorated)     Status: None   Collection Time: 10/16/18 11:20 AM   Specimen: Tracheal Aspirate; Respiratory  Result Value Ref Range Status   Specimen Description TRACHEAL ASPIRATE  Final   Special Requests NONE  Final   Gram Stain   Final    FEW WBC PRESENT, PREDOMINANTLY PMN MODERATE GRAM NEGATIVE RODS Performed at [facility], [place] [address], [place], [place] [address]    Culture FEW ESCHERICHIA COLI  Final   Report Status 10/18/2018 FINAL  Final   Organism ID, Bacteria ESCHERICHIA COLI  Final      Susceptibility   Escherichia coli - MIC*    AMPICILLIN >=32 RESISTANT Resistant     CEFAZOLIN 16 SENSITIVE Sensitive     CEFEPIME <=1 SENSITIVE Sensitive     CEFTAZIDIME <=1 SENSITIVE Sensitive     CEFTRIAXONE <=1 SENSITIVE Sensitive     CIPROFLOXACIN <=0.25 SENSITIVE Sensitive     GENTAMICIN <=1 SENSITIVE Sensitive     IMIPENEM <=0.25 SENSITIVE Sensitive     TRIMETH/SULFA <=20 SENSITIVE Sensitive     AMPICILLIN/SULBACTAM >=32 RESISTANT Resistant     PIP/TAZO 64 INTERMEDIATE Intermediate     Extended ESBL NEGATIVE Sensitive     * FEW ESCHERICHIA COLI  Culture, blood (Routine X 2) w Reflex to ID Panel     Status: None    Collection Time: 10/16/18 11:23 AM   Specimen: BLOOD RIGHT ARM  Result Value Ref Range Status   Specimen Description BLOOD RIGHT ARM  Final   Special Requests   Final    BOTTLES DRAWN AEROBIC AND ANAEROBIC Blood Culture adequate volume   Culture   Final    NO GROWTH 5 DAYS Performed at [facility], [address], [place], [place] [address]    Report Status 10/21/2018 FINAL  Final     Assessment/Plan: S/P Procedure(s) (LRB): RE-OPEN  LAPAROTOMY (N/A)  1 Remains critically ill with VDRF(PRVC with adeq pressures), on Neo for BP support, no significant change since yesterday. 2 Tmax 100.5. Leukocytosis is slightly increased - on Maxipime, Eraxis, flagyl- E.Coli in trach aspirate 3 conts TPN- severe protein cal malnutrition 4 renal fxn fairly stable with good UOP 5 drains with significant drainage 6 cont management as per CCM/Surgery   John Giovanni PA-C 10/22/2018 9:15 AM

## 2018-10-22 NOTE — Progress Notes (Signed)
NAME:  Tristan Hughes, MRN:  [id number], DOB:  28-Jul-1940, LOS: 21 ADMISSION DATE:  10/01/2018, CONSULTATION DATE:  10/16/18 REFERRING MD:  Dr. Barry Dienes , CHIEF COMPLAINT:  Respiratory Distress   Brief History   78 y/o M admitted 6/2 for planned laparoscopy in setting of known adenocarcinoma of the GE junction.  He is s/p proximal gastrectomy with esophagogastrostomy, pyloroplasty, jejunostomy tube placement, distal pancreatectomy and splenectomy.  There were concerns for possible leak post operatively.  CT of the ABD 6/12 raised concern for gas/fluid in the LUQ.  He developed respiratory distress 6/17 and was transferred to ICU.  Post intubation suffered PEA arrest requiring ~ 8 minutes of CPR before ROSC. Repeat ABD imaging 6/17 with concerns for stomach necrosis. He returned to the OR for completion gastrectomy. Returned 6/18 for VAC change, exploration & multiple drain placement.   Past Medical History  OSA , CAD, HTN, HLD, GERD, Gastric Cancer - identified 03/2018, s/p three cycles of FOLFOX, DM II, Depression, Anxiety   Significant Hospital Events   6/02 Admit  6/17 PCCM consulted for resp distress, PEA arrest post intubation x2.  To OR for gastrectomy 6/18 Off epi gtt.  To OR for VAC change, exploration and drain placement 6/21 Off pressors, start pressure support  Consults:  TCTS 6/19   Procedures:  ETT 6/17 >> L IJ TLC 6/17 >>  L Radial Aline 6/17 >> 6/20  Significant Diagnostic Tests:  CT Abd w/o 6/12 >> surgical changes of proximal gastrectomy with primary anastomosis, with no evidence of enteric contrast leak/anastomic leak or stomach wall disruption after PO contrast. Surgical changes of distal pancreatectomy & splenectomy with persisting flocculent gas/fluid in the LUQ (DDx infection, with perforation / disruption at the surgical site or stomach not likely given the absence of extraluminal contrast) CT Chest / ABD / Pelvis 6/17 >> dense bibasilar consolidation, nodular  airspace disease in RML, bilateral upper lobes, large well-circumscribed flocculent gas collection in the LUQ with concern for rupture of the stomach and necrosis / abscess. Fluid & gas in the distal esophagus which extends high into the esophagus.  Progression of subcutaneous gas in the left chest wall & right chest wall  Micro Data:  BCx2 6/8 >> negative  UC 6/8 >> negative  Tracheal aspirate 6/17 >> E coli BCx2 6/17 >>   Antimicrobials:  Anidulafungin 6/18 >>  Cefepime 6/19 >> Flagyl 6/19>>>  Interim history/subjective:  Intermittent agitation overnight, no new complaints  Objective   Blood pressure (!) 122/51, pulse (!) 57, temperature (!) 100.5 F (38.1 C), temperature source Axillary, resp. rate (!) 29, height 5\' 9"  (1.753 m), weight 101.9 kg, SpO2 97 %.    Vent Mode: PRVC FiO2 (%):  [40 %] 40 % Set Rate:  [20 bmp] 20 bmp Vt Set:  [560 mL] 560 mL PEEP:  [5 cmH20] 5 cmH20 Plateau Pressure:  [18 cmH20-21 cmH20] 18 cmH20   Intake/Output Summary (Last 24 hours) at 10/22/2018 0913 Last data filed at 10/22/2018 0800 Gross per 24 hour  Intake 5524.8 ml  Output 5875 ml  Net -350.2 ml   Filed Weights   10/14/18 1957 10/20/18 1500 10/21/18 0353  Weight: 94.9 kg 102 kg 101.9 kg    Examination:  General - Sedate but arousable, agitation when examined but calms down Eyes - PERRL, EOM-I and MMM ENT - ETT in place Cardiac - RRR, Nl S1/S2 and -M/R/G Chest - Diminished bilaterally Abdomen - Wound vac in place, hypoactive BS Extremities - 1+ edema Skin -  no rashes Neuro - RASS -3, moves extremities and opens eyes with stimulation GU - scrotal edema  I reviewed CXR myself, ETT is ok with bilateral infiltrate noted  Resolved Hospital Problem list   PEA arrest, AKI, Lactic acidosis, Thrombocytosis, Septic shock, ARDS, Hypophosphatemia  Assessment & Plan:   Sepsis from necrotic stomach with peritonitis and aspiration pneumonia with E coli. Plan - Day 15/21 of Abx, currently  on cefepime and flagyl - Day 7 of anidulafungin  Acute hypoxic respiratory failure with from aspiration pneumonia. Plan: - Hold PS trials today due to agiation - Lasix 40 mg IV q6 x3 doses - Continue D5W for one more day - F/u CXR - Goal SpO2 88 to 95% - Replace K, Mg and Phos while diuresing  Adenocarcinoma of GE Junction s/p Laparoscopy with Gastrectomy. Discussion: -proximal gastrectomy with esophagogastrostomy, pyloroplasty, jejunostomy tube placement, distal pancreatectomy and splenectomy 6/2.   -completed 3 cycles of FOLFOX -concern for LUQ gas/fluid on 6/12 CT ABD > back to OR for gastrectomy -abd closed 6/18 Plan - Post op care per surgery - TCTS consulted - Do not manipulate NG tube - Continue protonix  Acute metabolic encephalopathy. Plan - RASS goal -1 to -2 - D/C PRN versed - Start precedex - Minimize fentanyl drip  Moderate Protein Calorie Malnutrition Plan - Continue TPN  Anemia of critical illness. Plan - f/u CBC - transfuse for Hb < 7 - check iron levels  HTN, HLD, CAD s/p CABG. P: - hold outpt norvasc, ASA, zetia, lisinopril, lopressor, crestor  DM with Hyperglycemia  Plan - SSI  - Continue lantus to 35 units daily on 6/21 - Continue insulin to TPN to avoid further lantus at this point - ?when to use GI tract, will defer to CCS  Best practice:  Diet: TPN DVT prophylaxis: SQ heparin GI prophylaxis: Protonix Mobility: bed rest Code Status: Full Code  Disposition: ICU   Labs    CMP Latest Ref Rng & Units 10/22/2018 10/22/2018 10/21/2018  Glucose 70 - 99 mg/dL 221(H) - 279(H)  BUN 8 - 23 mg/dL 34(H) - 34(H)  Creatinine 0.61 - 1.24 mg/dL 1.23 - 1.03  Sodium 135 - 145 mmol/L 148(H) 148(H) 149(H)  Potassium 3.5 - 5.1 mmol/L 3.6 3.6 4.2  Chloride 98 - 111 mmol/L 115(H) - 116(H)  CO2 22 - 32 mmol/L 24 - 28  Calcium 8.9 - 10.3 mg/dL 7.6(L) - 7.8(L)  Total Protein 6.5 - 8.1 g/dL - - 4.4(L)  Total Bilirubin 0.3 - 1.2 mg/dL - - 0.4  Alkaline  Phos 38 - 126 U/L - - 88  AST 15 - 41 U/L - - 19  ALT 0 - 44 U/L - - 37   CBC Latest Ref Rng & Units 10/22/2018 10/22/2018 10/21/2018  WBC 4.0 - 10.5 K/uL 15.5(H) - 14.9(H)  Hemoglobin 13.0 - 17.0 g/dL 7.9(L) 13.3 7.3(L)  Hematocrit 39.0 - 52.0 % 27.0(L) 39.0 24.6(L)  Platelets 150 - 400 K/uL PLATELET CLUMPS NOTED ON SMEAR, UNABLE TO ESTIMATE - 228   ABG    Component Value Date/Time   PHART 7.487 (H) 10/22/2018 0256   PCO2ART 36.8 10/22/2018 0256   PO2ART 63.0 (L) 10/22/2018 0256   HCO3 27.9 10/22/2018 0256   TCO2 29 10/22/2018 0256   ACIDBASEDEF 2.0 10/17/2018 1544   O2SAT 94.0 10/22/2018 0256   CBG (last 3)  Recent Labs    10/21/18 2302 10/22/18 0305 10/22/18 0755  GLUCAP 290* 223* 190*   The patient is critically ill with multiple  organ systems failure and requires high complexity decision making for assessment and support, frequent evaluation and titration of therapies, application of advanced monitoring technologies and extensive interpretation of multiple databases.   Critical Care Time devoted to patient care services described in this note is  33  Minutes. This time reflects time of care of this signee Dr Jennet Maduro. This critical care time does not reflect procedure time, or teaching time or supervisory time of PA/NP/Med student/Med Resident etc but could involve care discussion time.  Rush Farmer, M.D. [facility] Pulmonary/Critical Care Medicine. Pager: [phone]. After hours pager: [phone].  10/22/2018, 9:13 AM

## 2018-10-22 NOTE — Plan of Care (Signed)
Pt increased anxiety w/ turning and movement. Pt receiving TPN in port for nutrition.

## 2018-10-23 ENCOUNTER — Inpatient Hospital Stay: Payer: Self-pay

## 2018-10-23 ENCOUNTER — Inpatient Hospital Stay: Payer: Medicare Other

## 2018-10-23 ENCOUNTER — Inpatient Hospital Stay: Payer: Medicare Other | Admitting: Hematology

## 2018-10-23 ENCOUNTER — Inpatient Hospital Stay (HOSPITAL_COMMUNITY): Payer: Medicare Other

## 2018-10-23 LAB — POCT I-STAT 7, (LYTES, BLD GAS, ICA,H+H)
Acid-Base Excess: 3 mmol/L — ABNORMAL HIGH (ref 0.0–2.0)
Bicarbonate: 27.2 mmol/L (ref 20.0–28.0)
Calcium, Ion: 1.23 mmol/L (ref 1.15–1.40)
HCT: 28 % — ABNORMAL LOW (ref 39.0–52.0)
Hemoglobin: 9.5 g/dL — ABNORMAL LOW (ref 13.0–17.0)
O2 Saturation: 93 %
Potassium: 3.7 mmol/L (ref 3.5–5.1)
Sodium: 147 mmol/L — ABNORMAL HIGH (ref 135–145)
TCO2: 28 mmol/L (ref 22–32)
pCO2 arterial: 39.1 mmHg (ref 32.0–48.0)
pH, Arterial: 7.451 — ABNORMAL HIGH (ref 7.350–7.450)
pO2, Arterial: 64 mmHg — ABNORMAL LOW (ref 83.0–108.0)

## 2018-10-23 LAB — CBC
HCT: 27 % — ABNORMAL LOW (ref 39.0–52.0)
HCT: 26.6 % — ABNORMAL LOW (ref 39.0–52.0)
Hemoglobin: 7.9 g/dL — ABNORMAL LOW (ref 13.0–17.0)
Hemoglobin: 8.1 g/dL — ABNORMAL LOW (ref 13.0–17.0)
MCH: 27.4 pg (ref 26.0–34.0)
MCH: 27.4 pg (ref 26.0–34.0)
MCHC: 29.3 g/dL — ABNORMAL LOW (ref 30.0–36.0)
MCHC: 30.5 g/dL (ref 30.0–36.0)
MCV: 93.8 fL (ref 80.0–100.0)
MCV: 89.9 fL (ref 80.0–100.0)
Platelets: UNDETERMINED 10*3/uL (ref 150–400)
Platelets: 274 10*3/uL (ref 150–400)
RBC: 2.88 MIL/uL — ABNORMAL LOW (ref 4.22–5.81)
RBC: 2.96 MIL/uL — ABNORMAL LOW (ref 4.22–5.81)
RDW: 17.2 % — ABNORMAL HIGH (ref 11.5–15.5)
RDW: 17.2 % — ABNORMAL HIGH (ref 11.5–15.5)
WBC: 15.5 10*3/uL — ABNORMAL HIGH (ref 4.0–10.5)
WBC: 20.9 10*3/uL — ABNORMAL HIGH (ref 4.0–10.5)
nRBC: 20.7 % — ABNORMAL HIGH (ref 0.0–0.2)
nRBC: 14.3 % — ABNORMAL HIGH (ref 0.0–0.2)

## 2018-10-23 LAB — BASIC METABOLIC PANEL
Anion gap: 7 (ref 5–15)
BUN: 34 mg/dL — ABNORMAL HIGH (ref 8–23)
CO2: 27 mmol/L (ref 22–32)
Calcium: 7.8 mg/dL — ABNORMAL LOW (ref 8.9–10.3)
Chloride: 111 mmol/L (ref 98–111)
Creatinine, Ser: 1.15 mg/dL (ref 0.61–1.24)
GFR calc Af Amer: >60 mL/min (ref >60)
GFR calc non Af Amer: >60 mL/min (ref >60)
Glucose, Bld: 193 mg/dL — ABNORMAL HIGH (ref 70–99)
Potassium: 4 mmol/L (ref 3.5–5.1)
Sodium: 145 mmol/L (ref 135–145)

## 2018-10-23 LAB — GLUCOSE, CAPILLARY
Glucose-Capillary: 166 mg/dL — ABNORMAL HIGH (ref 70–99)
Glucose-Capillary: 190 mg/dL — ABNORMAL HIGH (ref 70–99)
Glucose-Capillary: 197 mg/dL — ABNORMAL HIGH (ref 70–99)
Glucose-Capillary: 197 mg/dL — ABNORMAL HIGH (ref 70–99)
Glucose-Capillary: 197 mg/dL — ABNORMAL HIGH (ref 70–99)
Glucose-Capillary: 212 mg/dL — ABNORMAL HIGH (ref 70–99)

## 2018-10-23 LAB — PHOSPHORUS: Phosphorus: 3.6 mg/dL (ref 2.5–4.6)

## 2018-10-23 LAB — MAGNESIUM: Magnesium: 2 mg/dL (ref 1.7–2.4)

## 2018-10-23 MED ORDER — TRACE MINERALS CR-CU-MN-SE-ZN 10-1000-500-60 MCG/ML IV SOLN
INTRAVENOUS | Status: AC
  Administered 2018-10-23: 18:00:00 via INTRAVENOUS
  Filled 2018-10-23: qty 998.4

## 2018-10-23 MED ORDER — INSULIN ASPART 100 UNIT/ML ~~LOC~~ SOLN: 4.0000 [IU] | SUBCUTANEOUS | Status: DC

## 2018-10-23 NOTE — Progress Notes (Signed)
TCTS DAILY ICU PROGRESS NOTE                   [address]            [place] [address]          [phone]   6 Days Post-Op Procedure(s) (LRB): RE-OPEN  LAPAROTOMY (N/A)  Total Length of Stay:  LOS: 22 days   Subjective: No significant change in condition  Objective: Vital signs in last 24 hours: Temp:  [98.9 F (37.2 C)-101.3 F (38.5 C)] 101.3 F (38.5 C) (06/24 0800) Pulse Rate:  [52-72] 64 (06/24 0815) Cardiac Rhythm: Normal sinus rhythm;Sinus bradycardia (06/24 0800) Resp:  [11-31] 27 (06/24 0815) BP: (76-146)/(40-65) 108/50 (06/24 0815) SpO2:  [91 %-100 %] 93 % (06/24 0815) FiO2 (%):  [30 %-40 %] 30 % (06/24 0743) Weight:  [100.8 kg] 100.8 kg (06/24 0500)  Filed Weights   10/20/18 1500 10/21/18 0353 10/23/18 0500  Weight: 102 kg 101.9 kg 100.8 kg    Weight change:    Hemodynamic parameters for last 24 hours:    Intake/Output from previous day: 06/23 0701 - 06/24 0700 In: 4599.7 [I.V.:4320.8; IV Piggyback:1575.4] Out: 5295 [Urine:4510; Drains:785]  Intake/Output this shift: Total I/O In: 225.6 [I.V.:195.9; IV Piggyback:29.7] Out: 725 [Urine:625; Drains:100]  Current Meds: Scheduled Meds: . chlorhexidine gluconate (MEDLINE KIT)  15 mL Mouth Rinse BID  . Chlorhexidine Gluconate Cloth  6 each Topical Daily  . Chlorhexidine Gluconate Cloth  6 each Topical Daily  . heparin injection (subcutaneous)  5,000 Units Subcutaneous Q8H  . insulin aspart  0-20 Units Subcutaneous Q4H  . mouth rinse  15 mL Mouth Rinse 10 times per day  . pantoprazole  40 mg Intravenous Q12H  . sodium chloride flush  10-40 mL Intracatheter Q12H   Continuous Infusions: . sodium chloride Stopped (10/23/18 0711)  . sodium chloride Stopped (10/23/18 0722)  . anidulafungin Stopped (10/23/18 7414)  . ceFEPime (MAXIPIME) IV Stopped (10/23/18 2395)  . dexmedetomidine (PRECEDEX) IV infusion 0.9 mcg/kg/hr (10/23/18 0800)  . dextrose 50 mL/hr at 10/23/18 0800  .  fentaNYL infusion INTRAVENOUS 100 mcg/hr (10/23/18 0800)  . metronidazole Stopped (10/23/18 0717)  . phenylephrine (NEO-SYNEPHRINE) Adult infusion 30 mcg/min (10/23/18 0834)  . TPN ADULT (ION) 75 mL/hr at 10/23/18 0800   PRN Meds:.sodium chloride, sodium chloride, acetaminophen, albuterol, fentaNYL, fentaNYL (SUBLIMAZE) injection, midazolam, sodium chloride flush  no significant change in physical exam  Lab Results: CBC: Recent Labs    10/22/18 0432 10/23/18 0337 10/23/18 0442  WBC 15.5*  --  20.9*  HGB 7.9* 9.5* 8.1*  HCT 27.0* 28.0* 26.6*  PLT PLATELET CLUMPS NOTED ON SMEAR, UNABLE TO ESTIMATE  --  274   BMET:  Recent Labs    10/22/18 0432 10/23/18 0337 10/23/18 0442  NA 148* 147* 145  K 3.6 3.7 4.0  CL 115*  --  111  CO2 24  --  27  GLUCOSE 221*  --  193*  BUN 34*  --  34*  CREATININE 1.23  --  1.15  CALCIUM 7.6*  --  7.8*    CMET: Lab Results  Component Value Date   WBC 20.9 (H) 10/23/2018   HGB 8.1 (L) 10/23/2018   HCT 26.6 (L) 10/23/2018   PLT 274 10/23/2018   GLUCOSE 193 (H) 10/23/2018   CHOL 104 11/16/2017   TRIG 77 10/21/2018   HDL 39.60 11/16/2017   LDLCALC 45 11/16/2017   ALT 37 10/21/2018   AST 19  10/21/2018   NA 145 10/23/2018   K 4.0 10/23/2018   CL 111 10/23/2018   CREATININE 1.15 10/23/2018   BUN 34 (H) 10/23/2018   CO2 27 10/23/2018   PSA 1.36 08/03/2009   INR 1.4 (H) 10/16/2018   HGBA1C 5.8 (H) 09/25/2018   MICROALBUR 1.8 07/09/2009      PT/INR: No results for input(s): LABPROT, INR in the last 72 hours. Radiology: Dg Chest Port 1 View  Result Date: 10/23/2018 CLINICAL DATA:  Endotracheal tube placement. EXAM: PORTABLE CHEST 1 VIEW COMPARISON:  Radiograph of October 22, 2018. FINDINGS: Stable cardiomediastinal silhouette. Endotracheal tube is in grossly good position. Distal tip of nasogastric tube is stable in the distal esophagus; advancement is recommended. No pneumothorax is noted. Stable bilateral lung opacities are noted  concerning for atelectasis or pneumonia. Stable bibasilar opacities are noted concerning for atelectasis or edema. Small pleural effusions are probably present. Bony thorax is unremarkable. IMPRESSION: Stable support apparatus. Distal tip of nasogastric tube is seen in distal esophagus; advancement is recommended. Stable bilateral lung opacities and small effusions are noted as described above. Electronically Signed   By: Marijo Conception M.D.   On: 10/23/2018 07:37     Assessment/Plan: S/P Procedure(s) (LRB): RE-OPEN  LAPAROTOMY (N/A)  1 following with you, no significant change, remains critical    John Giovanni PA-C 10/23/2018 9:08 AM

## 2018-10-23 NOTE — Progress Notes (Signed)
Arminda (patient's daughter) called this RN for an update. She is requesting a discussion w/ Dr. Barry Dienes about patient's current condition, prognosis, and decisions that need to be made. She stated that "patient would not want to live this way, but they do not have the full clinical picture to make decisions." She stated that "they are ready to make decisions if they had all of the information necessary to do so." This RN called Dr. Brantley Stage and he will reach out to Dr. Barry Dienes.

## 2018-10-23 NOTE — Progress Notes (Signed)
Spoke to primary RN ,PICC placement cannot be  done tonight.

## 2018-10-23 NOTE — Progress Notes (Signed)
6 Days Post-Op   Subjective/Chief Complaint: Pt on vent  Opens eyes more responsive    Objective: Vital signs in last 24 hours: Temp:  [98.9 F (37.2 C)-101 F (38.3 C)] 99.7 F (37.6 C) (06/24 0400) Pulse Rate:  [52-72] 65 (06/24 0734) Resp:  [11-31] 30 (06/24 0734) BP: (76-146)/(40-65) 122/53 (06/24 0734) SpO2:  [91 %-100 %] 91 % (06/24 0734) FiO2 (%):  [30 %-40 %] 30 % (06/24 0743) Weight:  [100.8 kg] 100.8 kg (06/24 0500) Last BM Date: 10/15/18  Intake/Output from previous day: 06/23 0701 - 06/24 0700 In: 5329.9 [I.V.:4320.8; IV Piggyback:1575.4] Out: 5295 [Urine:4510; Drains:785] Intake/Output this shift: Total I/O In: -  Out: 725 [Urine:625; Drains:100]  Incision/Wound:vac in place drains bilious  ND   Lab Results:  Recent Labs    10/22/18 0432 10/23/18 0337 10/23/18 0442  WBC 15.5*  --  20.9*  HGB 7.9* 9.5* 8.1*  HCT 27.0* 28.0* 26.6*  PLT PLATELET CLUMPS NOTED ON SMEAR, UNABLE TO ESTIMATE  --  274   BMET Recent Labs    10/22/18 0432 10/23/18 0337 10/23/18 0442  NA 148* 147* 145  K 3.6 3.7 4.0  CL 115*  --  111  CO2 24  --  27  GLUCOSE 221*  --  193*  BUN 34*  --  34*  CREATININE 1.23  --  1.15  CALCIUM 7.6*  --  7.8*   PT/INR No results for input(s): LABPROT, INR in the last 72 hours. ABG Recent Labs    10/22/18 0256 10/23/18 0337  PHART 7.487* 7.451*  HCO3 27.9 27.2    Studies/Results: Dg Chest Port 1 View  Result Date: 10/23/2018 CLINICAL DATA:  Endotracheal tube placement. EXAM: PORTABLE CHEST 1 VIEW COMPARISON:  Radiograph of October 22, 2018. FINDINGS: Stable cardiomediastinal silhouette. Endotracheal tube is in grossly good position. Distal tip of nasogastric tube is stable in the distal esophagus; advancement is recommended. No pneumothorax is noted. Stable bilateral lung opacities are noted concerning for atelectasis or pneumonia. Stable bibasilar opacities are noted concerning for atelectasis or edema. Small pleural effusions are  probably present. Bony thorax is unremarkable. IMPRESSION: Stable support apparatus. Distal tip of nasogastric tube is seen in distal esophagus; advancement is recommended. Stable bilateral lung opacities and small effusions are noted as described above. Electronically Signed   By: Marijo Conception M.D.   On: 10/23/2018 07:37   Dg Chest Port 1 View  Result Date: 10/22/2018 CLINICAL DATA:  78 year old male with respiratory failure. Laparotomy and completion gastrectomy earlier this month for adenocarcinoma of the gastric cardia, pancreatic neuroendocrine tumor EXAM: PORTABLE CHEST 1 VIEW COMPARISON:  10/21/2018 and earlier. FINDINGS: Portable AP semi upright view at 0509 hours. Stable endotracheal tube tip at the level the clavicles. Stable enteric tube, tip at the level of the distal esophagus. Stable right chest porta cath, accessed. Stable left IJ central line. Mediastinal contours remain normal. Continued confluent bilateral perihilar opacity. Mildly improved lung base ventilation since 10/16/2018 when confluent bilateral lower lobe consolidation was demonstrated by CT. No areas of worsening ventilation. No pneumothorax. Paucity of bowel gas in the upper abdomen. IMPRESSION: 1. Stable lines and tubes. 2. Mildly improved lung base ventilation since 10/15/2008, otherwise stable ventilation. Electronically Signed   By: Genevie Ann M.D.   On: 10/22/2018 08:11    Anti-infectives: Anti-infectives (From admission, onward)   Start     Dose/Rate Route Frequency Ordered Stop   10/18/18 1300  metroNIDAZOLE (FLAGYL) IVPB 500 mg  500 mg 100 mL/hr over 60 Minutes Intravenous Every 8 hours 10/18/18 1230     10/18/18 1130  ceFEPIme (MAXIPIME) 2 g in sodium chloride 0.9 % 100 mL IVPB     2 g 200 mL/hr over 30 Minutes Intravenous Every 8 hours 10/18/18 1049     10/18/18 0300  anidulafungin (ERAXIS) 100 mg in sodium chloride 0.9 % 100 mL IVPB    Note to Pharmacy: Gross gastric contamination.  Coverage for yeast.    100 mg 78 mL/hr over 100 Minutes Intravenous Every 24 hours 10/17/18 0118     10/17/18 0130  anidulafungin (ERAXIS) 200 mg in sodium chloride 0.9 % 200 mL IVPB     200 mg 78 mL/hr over 200 Minutes Intravenous  Once 10/17/18 0119 10/17/18 0633   10/16/18 2342  vancomycin variable dose per unstable renal function (pharmacist dosing)  Status:  Discontinued      Does not apply See admin instructions 10/16/18 2342 10/18/18 0822   10/12/18 1800  vancomycin (VANCOCIN) 2,000 mg in sodium chloride 0.9 % 500 mL IVPB  Status:  Discontinued     2,000 mg 250 mL/hr over 120 Minutes Intravenous Every 24 hours 10/12/18 0958 10/16/18 2342   10/09/18 1800  vancomycin (VANCOCIN) 1,500 mg in sodium chloride 0.9 % 500 mL IVPB  Status:  Discontinued     1,500 mg 250 mL/hr over 120 Minutes Intravenous Every 24 hours 10/08/18 1648 10/12/18 0957   10/09/18 0200  piperacillin-tazobactam (ZOSYN) IVPB 3.375 g  Status:  Discontinued     3.375 g 12.5 mL/hr over 240 Minutes Intravenous Every 8 hours 10/08/18 1648 10/18/18 1040   10/08/18 1700  vancomycin (VANCOCIN) 1,500 mg in sodium chloride 0.9 % 500 mL IVPB     1,500 mg 250 mL/hr over 120 Minutes Intravenous  Once 10/08/18 1648 10/08/18 1924   10/08/18 1645  piperacillin-tazobactam (ZOSYN) IVPB 3.375 g     3.375 g 100 mL/hr over 30 Minutes Intravenous  Once 10/08/18 1638 10/08/18 1749   10/01/18 1600  ceFAZolin (ANCEF) IVPB 2g/100 mL premix     2 g 200 mL/hr over 30 Minutes Intravenous Every 8 hours 10/01/18 1539 10/01/18 1759   10/01/18 0631  ceFAZolin (ANCEF) 2-4 GM/100ML-% IVPB    Note to Pharmacy: Marga Melnick   : cabinet override      10/01/18 0631 10/01/18 0814   10/01/18 0630  ceFAZolin (ANCEF) IVPB 2g/100 mL premix     2 g 200 mL/hr over 30 Minutes Intravenous On call to O.R. 10/01/18 6144 10/01/18 0814      Assessment/Plan: s/p Procedure(s): RE-OPEN  LAPAROTOMY (N/A) Acute respiratory failure 10/16/18 - intubated PEA arrest 10/16/18- 8 min  CPR/ROSC Aspiration pneumonia Acute metabolic encephalopathy Moderate PCM Anemia Hx hypertension Hx CABG Hx type II diabetes   Adenocarcinoma the gastric cardia, pancreatic neuroendocrine tumor 1.Diagnostic laparoscopy, proximal gastrectomy with esophageal gastrostomy, pyloroplasty, jejunostomy, tube placement, intraoperative ultrasound, distal pancreatectomy, splenectomy 10/01/2018 Dr. Stark Klein. -UGI 10/08/2018 shows mild posterior leak- feeding via J tube 2.Reopening of recent laparotomy and completion gastrectomy, placement of Abthera wound vac6/17/20 Dr.Byerly 3.Reopening of recent laparotomy, removal of Abthera wound vac, placement of drains, closure of fascia6/18/20, Dr. Barry Dienes  FEN: NPO/IV fluids/TPN ID: anidulafungin 6/19 >>day 4 Cefepime 6/19 >>day 4; Flagyl 6/19 >>day 4 DVT: SCD CALLED DAUGHTER - UPDATED  WBC up Plan to CT scan later this week  Not many options at this point   Plan: Ongoing support.   LOS: 22 days    Marcello Moores  A Kerina Simoneau 10/23/2018

## 2018-10-23 NOTE — Progress Notes (Addendum)
NAME:  Tristan Hughes, MRN:  [id number], DOB:  1940/08/11, LOS: 27 ADMISSION DATE:  10/01/2018, CONSULTATION DATE:  10/16/18 REFERRING MD:  Dr. Barry Dienes , CHIEF COMPLAINT:  Respiratory Distress   Brief History   78 y/o M admitted 6/2 for planned laparoscopy in setting of known adenocarcinoma of the GE junction.  He is s/p proximal gastrectomy with esophagogastrostomy, pyloroplasty, jejunostomy tube placement, distal pancreatectomy and splenectomy.  There were concerns for possible leak post operatively.  CT of the ABD 6/12 raised concern for gas/fluid in the LUQ.  He developed respiratory distress 6/17 and was transferred to ICU.  Post intubation suffered PEA arrest requiring ~ 8 minutes of CPR before ROSC. Repeat ABD imaging 6/17 with concerns for stomach necrosis. He returned to the OR for completion gastrectomy. Returned 6/18 for VAC change, exploration & multiple drain placement.   Past Medical History  OSA , CAD, HTN, HLD, GERD, Gastric Cancer - identified 03/2018, s/p three cycles of FOLFOX, DM II, Depression, Anxiety   Significant Hospital Events   6/02 Admit  6/17 PCCM consulted for resp distress, PEA arrest post intubation x2.  To OR for gastrectomy 6/18 Off epi gtt.  To OR for VAC change, exploration and drain placement 6/21 Off pressors, start pressure support 6/24: Febrile, new leukocytosis Consults:  TCTS 6/19   Procedures:  ETT 6/17 >> L IJ TLC 6/17 >>  L Radial Aline 6/17 >> 6/20  Significant Diagnostic Tests:  CT Abd w/o 6/12: surgical changes of proximal gastrectomy with primary anastomosis, with no evidence of enteric contrast leak/anastomic leak or stomach wall disruption after PO contrast. Surgical changes of distal pancreatectomy & splenectomy with persisting flocculent gas/fluid in the LUQ (DDx infection, with perforation / disruption at the surgical site or stomach not likely given the absence of extraluminal contrast) CT Chest / ABD / Pelvis 6/17: dense bibasilar  consolidation, nodular airspace disease in RML, bilateral upper lobes, large well-circumscribed flocculent gas collection in the LUQ with concern for rupture of the stomach and necrosis / abscess. Fluid & gas in the distal esophagus which extends high into the esophagus.  Progression of subcutaneous gas in the left chest wall & right chest wall  Micro Data:  BCx2 6/8 >> negative  UC 6/8 >> negative  Tracheal aspirate 6/17 >> E coli BCx2 6/17 >> negative  Antimicrobials:  Anidulafungin 6/18 >>  Cefepime 6/19 >> Flagyl 6/19>>>  Interim history/subjective:  Febrile overnight  Objective   Blood pressure (Abnormal) 108/50, pulse 64, temperature (Abnormal) 101.3 F (38.5 C), temperature source Axillary, resp. rate (Abnormal) 27, height 5\' 9"  (1.753 m), weight 100.8 kg, SpO2 93 %.    Vent Mode: PRVC FiO2 (%):  [30 %-40 %] 30 % Set Rate:  [20 bmp] 20 bmp Vt Set:  [560 mL] 560 mL PEEP:  [5 cmH20] 5 cmH20 Pressure Support:  [5 cmH20] 5 cmH20 Plateau Pressure:  [17 cmH20-18 cmH20] 17 cmH20   Intake/Output Summary (Last 24 hours) at 10/23/2018 0858 Last data filed at 10/23/2018 0800 Gross per 24 hour  Intake 5822.26 ml  Output 5895 ml  Net -72.74 ml   Filed Weights   10/20/18 1500 10/21/18 0353 10/23/18 0500  Weight: 102 kg 101.9 kg 100.8 kg    Examination:  General - 78 year old male. Sitting comfortably in bed.  HENT - [place]/AT, ETT in place Cardiac - Sinus bradycardia, RR, no murmurs, gallops, or rubs.  Chest - Diminished bilaterally Abdomen - Wound vac in place with yellow-green drainage. 4  JP drains in abd, hypoactive bowel sounds Extremities - +1 generalized edema Skin - Warm, dry, intact Neuro - Sedated to RAAS -2, moves all extremities, opens eyes to command GU - +3 scrotal edema present  Resolved Hospital Problem list   PEA arrest, AKI, Lactic acidosis, Thrombocytosis, Septic shock, ARDS, Hypophosphatemia  Assessment & Plan:   Sepsis from necrotic stomach with  peritonitis and aspiration pneumonia with E coli. WBC - 20.9, Febrile 38.5C; 6/19 BAL: E.coli; 6/22 Blood cultures: negative Plan: - Day 16/21 of ABX, currently on cefepime and flagyl - Day 7 pf anidulafungin - Trend WBCs - Tylenol for fever - Per surgery, plan CT scan later in the week  Acute hypoxic respiratory failure with from aspiration pneumonia. Plan: - Full vent support - VAP Bundle - Goal SpO2: 88 to 95% - Daily assessment for SBT, current mental status does not support extubation trial  Adenocarcinoma of GE Junction s/p Laparoscopy with Gastrectomy. Discussion: Plan: - Post-op care per surgery - TCTS consulted - Do not manipulate NG tube - Continue PPI  Acute metabolic encephalopathy. Plan: - RASS goal: -1 to -2  - Continue precedex and minimize fentanyl drip  Moderate Protein Calorie Malnutrition Plan: - Continue TPN - Defer TPN management to Pharmacy  Anemia of critical illness. Hgb 8.1 Plan: - Transfuse for Hgb < 7 - Trend CBCs  Hypertension, Hyperlipidemia, CAD s/p CABG. Currently hypotensive Plan: - Phenyleprine for SBP > 90   - Hold anti-hypertensive home meds  Diabetes Mellitis, type II Persists Plan: - SSI - Added scheduled low dose basal   Best practice:  Diet: TPN DVT prophylaxis: SQ heparin GI prophylaxis: Protonix Mobility: bed rest Code Status: Full Code  Disposition: ICU  Remains critically ill. Require CCM for BP control and mechanical ventilation support. Febrile and slightly increased leukocytosis overnight. Agree with surgery team. Little to do at this point with rising leukocytosis. Raising concern for abdominal process. Will defer timing for CT imaging to surgery.   Erick Colace ACNP-BC [place] Pager # [phone] OR # [phone] if no answer  Attending Note:  78 year old male with gastric cancer who presents to PCCM with post op respiratory failure.  Patient continues to have a wound vac on.  No  events overnight, continues to need pressors overnight.  On exam, lungs with coarse BS diffusely.  I reviewed CXR myself, ETT is in a good position.  Discussed with PCCM-NP.  Will continue full vent support.  Diureses as able.  Replace electrolytes.  Had an extensive discussion with the daughter.  I do not feel that she has very realistic expectations on what is going to happen with her father.  Will continue pressor support for now.  PCCM will continue to follow.  The patient is critically ill with multiple organ systems failure and requires high complexity decision making for assessment and support, frequent evaluation and titration of therapies, application of advanced monitoring technologies and extensive interpretation of multiple databases.   Critical Care Time devoted to patient care services described in this note is  35  Minutes. This time reflects time of care of this signee Dr Jennet Maduro. This critical care time does not reflect procedure time, or teaching time or supervisory time of PA/NP/Med student/Med Resident etc but could involve care discussion time.  Rush Farmer, M.D. [facility]monary/Critical Care Medicine. Pager: [phone]. After hours pager: [phone].

## 2018-10-23 NOTE — Progress Notes (Signed)
[place] NOTE   Pharmacy Consult for TPN Indication: post-gastrectomy  Patient Measurements: Height: 5\' 9"  (175.3 cm) Weight: 222 lb 3.6 oz (100.8 kg) IBW/kg (Calculated) : 70.7 TPN AdjBW (KG): 76.7 Body mass index is 32.82 kg/m. Usual Weight: ~220 lbs  Assessment:  78 yo M admitted on 6/2 for laparoscopy in the setting of known adenocarcinoma of the GE junction. S/p proximal gastrectomy with esophagogastrostomy, pyloroplasty, jejunostomy tube placement, distal pancreatectomy and splenectomy. Developed respiratory distress requiring intubation and a PEA arrest on 6/17. Repeat abdominal imaging 6/17 with concern for stomach necrosis. S/p complete gastrectomy and VAC change 6/18. Has been receiving tube feeds since 6/4 (stopped 6/18) and has moderate protein caloric malnutrition.   GI: TF 6/4>>6/18, albumin 1.8, prealbumin 5.3 Endo: Hx DM, now s/p distal pancreatectomy. Prior to admission on Lantus 40 daily + Novolog ~30 units daily. Current cbgs 160-300s. Insulin requirements in the past 24 hours: 48 units Novolog + 50 units regular insulin in TPN Lytes: K 4, coCa 9.9, Na 145, phos 3.6; Mg 2 Renal: AKI resolving. SCr 1.1, UOP 1.3 ml/kg/hr Pulm: intubated 6/17 for acute hypoxemic respiratory failure, possible aspiration, moderate ARDS. Cards: s/p PEA arrest on 6/17. MAP 71 (using cuff as aline not working). Requiring Neo. Hepatobil: AST/ALT 19/37, tbili wnl Neuro: sedated on fentanyl and versed. RASS -3.  ID: abx for aspiration PNA and peritonitis- Ecoli intermediate to Zosyn>> changed to Cefepime + Flagyl and continuing Eraxis. WBC 24 >14, Tm 99  TPN Access: central line placed 6/17 TPN start date: 6/18 Nutritional Goals (per RD recommendations on 6/18) KCal: 2438 kcal Protein: 135-175 g Fluid: >2,000 mL   Current Nutrition: tube feeds off 6/17, TPN initiated 6/18  Plan:  -Increase TPN to 80 mL/hr -This TPN provides 150 g of protein,  288 g of dextrose, and 65 g of lipids which provides 2232 kCals per day, meeting ~92% of kcal needs and 100% of protein needs -Electrolytes in TPN: reduce Na, increase Mg, chloride/acetate 1:2  -Add MVI to TPN -Trace elements to be added only MWF due to national shortage -Continue regular insulin 50 units in bag, did not increase insulin today as d5w infusion has been discontinued   [name] 10/23/2018 7:51 AM

## 2018-10-24 LAB — CBC
HCT: 26.4 % — ABNORMAL LOW (ref 39.0–52.0)
Hemoglobin: 8 g/dL — ABNORMAL LOW (ref 13.0–17.0)
MCH: 27.6 pg (ref 26.0–34.0)
MCHC: 30.3 g/dL (ref 30.0–36.0)
MCV: 91 fL (ref 80.0–100.0)
Platelets: 276 10*3/uL (ref 150–400)
RBC: 2.9 MIL/uL — ABNORMAL LOW (ref 4.22–5.81)
RDW: 17.6 % — ABNORMAL HIGH (ref 11.5–15.5)
WBC: 23.9 10*3/uL — ABNORMAL HIGH (ref 4.0–10.5)
nRBC: 4.4 % — ABNORMAL HIGH (ref 0.0–0.2)

## 2018-10-24 LAB — COMPREHENSIVE METABOLIC PANEL
ALT: 14 U/L (ref 0–44)
AST: 14 U/L — ABNORMAL LOW (ref 15–41)
Albumin: 1.2 g/dL — ABNORMAL LOW (ref 3.5–5.0)
Alkaline Phosphatase: 81 U/L (ref 38–126)
Anion gap: 4 — ABNORMAL LOW (ref 5–15)
BUN: 36 mg/dL — ABNORMAL HIGH (ref 8–23)
CO2: 25 mmol/L (ref 22–32)
Calcium: 7.8 mg/dL — ABNORMAL LOW (ref 8.9–10.3)
Chloride: 116 mmol/L — ABNORMAL HIGH (ref 98–111)
Creatinine, Ser: 1.01 mg/dL (ref 0.61–1.24)
GFR calc Af Amer: >60 mL/min (ref >60)
GFR calc non Af Amer: >60 mL/min (ref >60)
Glucose, Bld: 203 mg/dL — ABNORMAL HIGH (ref 70–99)
Potassium: 4.2 mmol/L (ref 3.5–5.1)
Sodium: 145 mmol/L (ref 135–145)
Total Bilirubin: 0.8 mg/dL (ref 0.3–1.2)
Total Protein: 4.8 g/dL — ABNORMAL LOW (ref 6.5–8.1)

## 2018-10-24 LAB — GLUCOSE, CAPILLARY
Glucose-Capillary: 219 mg/dL — ABNORMAL HIGH (ref 70–99)
Glucose-Capillary: 159 mg/dL — ABNORMAL HIGH (ref 70–99)
Glucose-Capillary: 141 mg/dL — ABNORMAL HIGH (ref 70–99)
Glucose-Capillary: 214 mg/dL — ABNORMAL HIGH (ref 70–99)
Glucose-Capillary: 219 mg/dL — ABNORMAL HIGH (ref 70–99)
Glucose-Capillary: 231 mg/dL — ABNORMAL HIGH (ref 70–99)

## 2018-10-24 LAB — PHOSPHORUS: Phosphorus: 2.5 mg/dL (ref 2.5–4.6)

## 2018-10-24 LAB — MAGNESIUM: Magnesium: 2.2 mg/dL (ref 1.7–2.4)

## 2018-10-24 MED ORDER — SODIUM CHLORIDE 0.9% FLUSH
10.0000 mL | INTRAVENOUS | Status: DC | PRN
  Administered 2018-10-25: 19:00:00 10 mL
  Filled 2018-10-24: qty 40

## 2018-10-24 MED ORDER — CHLORHEXIDINE GLUCONATE CLOTH 2 % EX PADS: 6.0000 | MEDICATED_PAD | Freq: Every day | CUTANEOUS | Status: DC

## 2018-10-24 MED ORDER — FUROSEMIDE 10 MG/ML IJ SOLN
40.0000 mg | Freq: Four times a day (QID) | INTRAMUSCULAR | Status: AC
  Administered 2018-10-24 (×3): 40 mg via INTRAVENOUS
  Filled 2018-10-24 (×3): qty 4

## 2018-10-24 MED ORDER — STERILE WATER FOR INJECTION IV SOLN
INTRAVENOUS | Status: AC
  Administered 2018-10-24: 18:00:00 via INTRAVENOUS
  Filled 2018-10-24: qty 998.4

## 2018-10-24 MED ORDER — SODIUM CHLORIDE 0.9% FLUSH
10.0000 mL | Freq: Two times a day (BID) | INTRAVENOUS | Status: DC
  Administered 2018-10-24: 14:00:00 30 mL
  Administered 2018-10-24 – 2018-10-25 (×3): 10 mL

## 2018-10-24 NOTE — Progress Notes (Signed)
7 Days Post-Op   Subjective/Chief Complaint: Pt on vent opens eyes  Talked with daughter today and communicated with Dr Barry Dienes yesterday Requested she call the family     Objective: Vital signs in last 24 hours: Temp:  [98.9 F (37.2 C)-99.8 F (37.7 C)] 99.6 F (37.6 C) (06/25 0000) Pulse Rate:  [50-108] 82 (06/25 0729) Resp:  [0-40] 35 (06/25 0729) BP: (82-157)/(45-118) 115/55 (06/25 0729) SpO2:  [95 %-99 %] 97 % (06/25 0730) FiO2 (%):  [30 %] 30 % (06/25 0730) Weight:  [100.1 kg] 100.1 kg (06/25 0500) Last BM Date: 10/15/18  Intake/Output from previous day: 06/24 0701 - 06/25 0700 In: 4382.9 [I.V.:3629.2; IV Piggyback:753.8] Out: 3085 [Urine:2175; Drains:910] Intake/Output this shift: Total I/O In: -  Out: 550 [Urine:450; Drains:100]  General appearance: cachectic Incision/Wound:wound vac in place  Drains bilious  Soft ND   Lab Results:  Recent Labs    10/22/18 0432 10/23/18 0337 10/23/18 0442  WBC 15.5*  --  20.9*  HGB 7.9* 9.5* 8.1*  HCT 27.0* 28.0* 26.6*  PLT PLATELET CLUMPS NOTED ON SMEAR, UNABLE TO ESTIMATE  --  274   BMET Recent Labs    10/23/18 0442 10/24/18 0500  NA 145 145  K 4.0 4.2  CL 111 116*  CO2 27 25  GLUCOSE 193* 203*  BUN 34* 36*  CREATININE 1.15 1.01  CALCIUM 7.8* 7.8*   PT/INR No results for input(s): LABPROT, INR in the last 72 hours. ABG Recent Labs    10/22/18 0256 10/23/18 0337  PHART 7.487* 7.451*  HCO3 27.9 27.2    Studies/Results: Dg Chest Port 1 View  Result Date: 10/23/2018 CLINICAL DATA:  Endotracheal tube placement. EXAM: PORTABLE CHEST 1 VIEW COMPARISON:  Radiograph of October 22, 2018. FINDINGS: Stable cardiomediastinal silhouette. Endotracheal tube is in grossly good position. Distal tip of nasogastric tube is stable in the distal esophagus; advancement is recommended. No pneumothorax is noted. Stable bilateral lung opacities are noted concerning for atelectasis or pneumonia. Stable bibasilar opacities are  noted concerning for atelectasis or edema. Small pleural effusions are probably present. Bony thorax is unremarkable. IMPRESSION: Stable support apparatus. Distal tip of nasogastric tube is seen in distal esophagus; advancement is recommended. Stable bilateral lung opacities and small effusions are noted as described above. Electronically Signed   By: Marijo Conception M.D.   On: 10/23/2018 07:37   [place] Ekg Site Rite  Result Date: 10/23/2018 If Site Rite image not attached, placement could not be confirmed due to current cardiac rhythm.   Anti-infectives: Anti-infectives (From admission, onward)   Start     Dose/Rate Route Frequency Ordered Stop   10/18/18 1300  metroNIDAZOLE (FLAGYL) IVPB 500 mg     500 mg 100 mL/hr over 60 Minutes Intravenous Every 8 hours 10/18/18 1230     10/18/18 1130  ceFEPIme (MAXIPIME) 2 g in sodium chloride 0.9 % 100 mL IVPB     2 g 200 mL/hr over 30 Minutes Intravenous Every 8 hours 10/18/18 1049     10/18/18 0300  anidulafungin (ERAXIS) 100 mg in sodium chloride 0.9 % 100 mL IVPB    Note to Pharmacy: Gross gastric contamination.  Coverage for yeast.   100 mg 78 mL/hr over 100 Minutes Intravenous Every 24 hours 10/17/18 0118     10/17/18 0130  anidulafungin (ERAXIS) 200 mg in sodium chloride 0.9 % 200 mL IVPB     200 mg 78 mL/hr over 200 Minutes Intravenous  Once 10/17/18 0119 10/17/18 7353  10/16/18 2342  vancomycin variable dose per unstable renal function (pharmacist dosing)  Status:  Discontinued      Does not apply See admin instructions 10/16/18 2342 10/18/18 0822   10/12/18 1800  vancomycin (VANCOCIN) 2,000 mg in sodium chloride 0.9 % 500 mL IVPB  Status:  Discontinued     2,000 mg 250 mL/hr over 120 Minutes Intravenous Every 24 hours 10/12/18 0958 10/16/18 2342   10/09/18 1800  vancomycin (VANCOCIN) 1,500 mg in sodium chloride 0.9 % 500 mL IVPB  Status:  Discontinued     1,500 mg 250 mL/hr over 120 Minutes Intravenous Every 24 hours 10/08/18 1648 10/12/18  0957   10/09/18 0200  piperacillin-tazobactam (ZOSYN) IVPB 3.375 g  Status:  Discontinued     3.375 g 12.5 mL/hr over 240 Minutes Intravenous Every 8 hours 10/08/18 1648 10/18/18 1040   10/08/18 1700  vancomycin (VANCOCIN) 1,500 mg in sodium chloride 0.9 % 500 mL IVPB     1,500 mg 250 mL/hr over 120 Minutes Intravenous  Once 10/08/18 1648 10/08/18 1924   10/08/18 1645  piperacillin-tazobactam (ZOSYN) IVPB 3.375 g     3.375 g 100 mL/hr over 30 Minutes Intravenous  Once 10/08/18 1638 10/08/18 1749   10/01/18 1600  ceFAZolin (ANCEF) IVPB 2g/100 mL premix     2 g 200 mL/hr over 30 Minutes Intravenous Every 8 hours 10/01/18 1539 10/01/18 1759   10/01/18 0631  ceFAZolin (ANCEF) 2-4 GM/100ML-% IVPB    Note to Pharmacy: Marga Melnick   : cabinet override      10/01/18 0631 10/01/18 0814   10/01/18 0630  ceFAZolin (ANCEF) IVPB 2g/100 mL premix     2 g 200 mL/hr over 30 Minutes Intravenous On call to O.R. 10/01/18 3785 10/01/18 0814      Assessment/Plan: s/p Procedure(s): RE-OPEN  LAPAROTOMY (N/A) s/p Procedure(s): RE-OPEN  LAPAROTOMY (N/A) Acute respiratory failure 10/16/18 - intubated PEA arrest 10/16/18- 8 min CPR/ROSC Aspiration pneumonia Acute metabolic encephalopathy Moderate PCM Anemia Hx hypertension Hx CABG Hx type II diabetes   Adenocarcinoma the gastric cardia, pancreatic neuroendocrine tumor 1.Diagnostic laparoscopy, proximal gastrectomy with esophageal gastrostomy, pyloroplasty, jejunostomy, tube placement, intraoperative ultrasound, distal pancreatectomy, splenectomy 10/01/2018 Dr. Stark Klein. -UGI 10/08/2018 shows mild posterior leak- feeding via J tube 2.Reopening of recent laparotomy and completion gastrectomy, placement of Abthera wound vac6/17/20 Dr.Byerly 3.Reopening of recent laparotomy, removal of Abthera wound vac, placement of drains, closure of fascia6/18/20, Dr. Barry Dienes  FEN: NPO/IV fluids/TPN ID: anidulafungin 6/19 >>day 4 Cefepime 6/19 >>day  4; Flagyl 6/19 >>day 4 DVT: SCD CALLED DAUGHTER - UPDATED  May request DNR status but wishes to speak to Dr Barry Dienes about prognosis  WBC up reheck today Plan to CT scan later this week  Not many options at this point   Plan: Ongoing support.  LOS: 23 days    Joyice Faster Adrea Sherpa 10/24/2018

## 2018-10-24 NOTE — Progress Notes (Signed)
[facility] CONSULT NOTE   Pharmacy Consult for TPN Indication: post-gastrectomy  Patient Measurements: Height: 5\' 9"  (175.3 cm) Weight: 220 lb 10.9 oz (100.1 kg) IBW/kg (Calculated) : 70.7 TPN AdjBW (KG): 76.7 Body mass index is 32.59 kg/m. Usual Weight: ~220 lbs  Assessment:  78 yo M admitted on 6/2 for laparoscopy in the setting of known adenocarcinoma of the GE junction. S/p proximal gastrectomy with esophagogastrostomy, pyloroplasty, jejunostomy tube placement, distal pancreatectomy and splenectomy. Developed respiratory distress requiring intubation and a PEA arrest on 6/17. Repeat abdominal imaging 6/17 with concern for stomach necrosis. S/p complete gastrectomy and VAC change 6/18. Has been receiving tube feeds since 6/4 (stopped 6/18) and has moderate protein caloric malnutrition.   GI: TF 6/4>>6/18, albumin 1.8, prealbumin 5.3 Endo: Hx DM, now s/p distal pancreatectomy. Current cbgs 160-240s. Insulin requirements in the past 24 hours: 30 units Novolog + 50 units regular insulin in TPN Lytes: K 4.2, coCa 10, Na 145, phos 2.5; Mg 2.2 Renal: AKI resolving. SCr 1, UOP 1.3 ml/kg/hr Pulm: intubated 6/17 for acute hypoxemic respiratory failure, possible aspiration, moderate ARDS. Cards: s/p PEA arrest on 6/17. MAP 70s Requiring Neo. Hepatobil: AST/ALT 14/14, tbili wnl Neuro: sedated on fentanyl and versed. RASS -3.  ID: abx for aspiration PNA and peritonitis- Ecoli intermediate to Zosyn>> changed to Cefepime + Flagyl and continuing Eraxis. WBC 24 >21, Tm 99  TPN Access: L-IJ 6/17 TPN start date: 6/18 Nutritional Goals (per RD recommendations on 6/18) KCal: 2438 kcal Protein: 135-175 g Fluid: >2,000 mL  Current Nutrition: tube feeds off 6/17, TPN initiated 6/18   Plan:  -Increase TPN to 80 mL/hr -This TPN provides 150 g of protein, 307 g of dextrose, and 65 g of lipids which provides 2297 kCals per day, meeting ~94% of kcal needs and 100% of  protein needs -Electrolytes in TPN: reduced Na, other electrolytes to standard chloride/acetate to max acetate -Add MVI to TPN -Trace elements to be added only MWF due to national shortage -Increase insulin again to 60 units    [name] 10/24/2018 7:54 AM

## 2018-10-24 NOTE — Progress Notes (Addendum)
NAME:  Tristan Hughes, MRN:  [id number], DOB:  07/28/1940, LOS: 38 ADMISSION DATE:  10/01/2018, CONSULTATION DATE:  10/16/18 REFERRING MD:  Dr. Barry Dienes , CHIEF COMPLAINT:  Respiratory Distress   Brief History   78 y/o M admitted 6/2 for planned laparoscopy in setting of known adenocarcinoma of the GE junction.  He is s/p proximal gastrectomy with esophagogastrostomy, pyloroplasty, jejunostomy tube placement, distal pancreatectomy and splenectomy.  There were concerns for possible leak post operatively.  CT of the ABD 6/12 raised concern for gas/fluid in the LUQ.  He developed respiratory distress 6/17 and was transferred to ICU.  Post intubation suffered PEA arrest requiring ~ 8 minutes of CPR before ROSC. Repeat ABD imaging 6/17 with concerns for stomach necrosis. He returned to the OR for completion gastrectomy. Returned 6/18 for VAC change, exploration & multiple drain placement.   Past Medical History  OSA , CAD, HTN, HLD, GERD, Gastric Cancer - identified 03/2018, s/p three cycles of FOLFOX, DM II, Depression, Anxiety   Significant Hospital Events   6/02 Admit  6/17 PCCM consulted for resp distress, PEA arrest post intubation x2.  To OR for gastrectomy. CT concerning for stomach necrosis. 6/18 Off epi gtt.  To OR for VAC change, exploration and drain placement 6/21 Off pressors, start pressure support 6/24: Febrile, new leukocytosis  Consults:  TCTS 6/19   Procedures:  ETT 6/17 >> L IJ TLC 6/17 >>  L Radial Aline 6/17 >> 6/20  Significant Diagnostic Tests:  CT Abd w/o 6/12: surgical changes of proximal gastrectomy with primary anastomosis, with no evidence of enteric contrast leak/anastomic leak or stomach wall disruption after PO contrast. Surgical changes of distal pancreatectomy & splenectomy with persisting flocculent gas/fluid in the LUQ (DDx infection, with perforation / disruption at the surgical site or stomach not likely given the absence of extraluminal contrast) CT Chest /  ABD / Pelvis 6/17: dense bibasilar consolidation, nodular airspace disease in RML, bilateral upper lobes, large well-circumscribed flocculent gas collection in the LUQ with concern for rupture of the stomach and necrosis / abscess. Fluid & gas in the distal esophagus which extends high into the esophagus.  Progression of subcutaneous gas in the left chest wall & right chest wall  Micro Data:  BCx2 6/8 >> negative  UC 6/8 >> negative  Tracheal aspirate 6/17 >> E coli BCx2 6/17 >> negative  Antimicrobials:  Anidulafungin 6/18 >>  Cefepime 6/19 >> Flagyl 6/19>>>  Interim history/subjective:  No new changes. Low grade fevers   Objective   Blood pressure (Abnormal) 116/51, pulse 70, temperature 99.6 F (37.6 C), temperature source Oral, resp. rate (Abnormal) 24, height 5\' 9"  (1.753 m), weight 100.1 kg, SpO2 97 %.    Vent Mode: PSV;CPAP FiO2 (%):  [30 %] 30 % Set Rate:  [20 bmp] 20 bmp Vt Set:  [560 mL] 560 mL PEEP:  [5 cmH20] 5 cmH20 Pressure Support:  [12 cmH20] 12 cmH20 Plateau Pressure:  [16 cmH20-19 cmH20] 16 cmH20   Intake/Output Summary (Last 24 hours) at 10/24/2018 0836 Last data filed at 10/24/2018 0745 Gross per 24 hour  Intake 4157.36 ml  Output 2910 ml  Net 1247.36 ml   Filed Weights   10/21/18 0353 10/23/18 0500 10/24/18 0500  Weight: 101.9 kg 100.8 kg 100.1 kg    Examination:  General -  78 year old male. Cachetic, ill-appearing.  HENT - [place]/AT, ETT in place Cardiac - RRR, no murmurs, rubs, gallops Resp - Diminished breath sounds bilaterally Abdomen - Soft, non-tender  distended. Multiple drainage tubes with green drainage. Wound vac in place, hypoactive bowel sounds Extremities - +1 generalized edema, good cap refill  Skin - Warm, dry, intact Neuro - Sedated to a RASS -1, PERRLA intact, follows commands, moves all extremities GU - +3 scrotal edema  Resolved Hospital Problem list   PEA arrest, AKI, Lactic acidosis, Thrombocytosis, Septic shock, ARDS,  Hypophosphatemia  Assessment & Plan:   Sepsis from necrotic stomach with peritonitis and aspiration pneumonia with E coli. WBC 23.9; 6/19 BAL: E. Coli, 6/22 BC: negative Plan: Day 17/21 of ABX, currently on cefepime and flagyl Day 8 of anidulafungin Trend WBCs Fever broke, continue to trend  Per Surgery, plan CT scan later in the week  Acute hypoxic respiratory failure with from aspiration pneumonia. Plan: Full vent support VAP bundle Goal SpO2: 88-95% Daily assessment for SBT, current clinical condition does not support extubation trial  Adenocarcinoma of GE Junction s/p Laparoscopy with Gastrectomy. Discussion: 6/2 Proximal gastrectomy with esophagogastrostomy, pyloroplasty, jejunostomy tube placement, distal pancreatectomy and splenectomy Completed 3 cycles of FOLFOX 6/12 CT Abd concern for LUQ gas/fluid > back to OR for gastrectomy 6/18 Abd closed Plan: Post-op care per surgery TCTS consulted Do not manipulate NG tube Continue protonix  Acute metabolic encephalopathy. Plan: RASS goal: 0 to -1 Continue precedex and minimize fentanyl drip  Moderate Protein Calorie Malnutrition Plan: Continue TPN Defer TPN management to Pharm  Anemia of critical illness. Hgb 8.0 Plan: Trend CBCs Transfer for Hgb <7  Hypertension, Hyperlipidemia, CAD s/p CABG. Stable Plan: Phenyleprine for SBP > 90 Hold anti-hypertensive home meds   Diabetes Mellitis, type II Persists Plan: SSI and low dose basal    Best practice:  Diet: TPN DVT prophylaxis: SQ heparin GI prophylaxis: Protonix Mobility: bed rest Code Status: Full Code  Disposition: ICU  Remains critically ill. Requires CCM for BP management, mechanical ventilation support, and sepsis process. No events overnight, continued to require pressors. Continue TPN. No longer febrile. Agree with Surgery's recommendation that there are not many options at this point with rising leukocytosis. Raising concern for abdominal  process. Will defer timing for CT imagining to surgery. Dr. Norman Clay is to talk to family today. Advise against a trach Recommend comfort care measures at this point by defer conversation to Dr. Barry Dienes and will touch base later today.   Erick Colace ACNP-BC [facility] Pulmonary/Critical Care Pager # [phone] OR # [phone] if no answer  Attending Note:  78 year old male with PMH of adeno of the GE junction s/p laparotomy with subsequent respiratory failure and an 8 minutes PEA arrest.  Patient failed weaning this AM.  On exam, weaning with a RR of 35 and visible struggle to breath with coarse BS diffusely.  I reviewed CXR myself, ETT is in a good position.  Discussed with PCCM-NP.  Switch back to full support.  Continue TPN.  Will add lasix today for fluid overload.  Would like to use free water whenever is ok with surgery.  Spoke with daughter at length yesterday.  Informed her that PCCM realistically is a support role here and that we need guidance from the surgery team prior to making recommendations about airway.  Discussed with Dr. Brantley Stage, Dr. Barry Dienes the primary surgeon is to call family today and let us know the plan.  My concern is that if we proceed with tracheostomy then patient will continue to aspirate as I do not think he will be able to protect his airway.  Would recommend comfort measures at  this point but will defer family conversation to Dr. Barry Dienes at this point and will communicate later on today.  The patient is critically ill with multiple organ systems failure and requires high complexity decision making for assessment and support, frequent evaluation and titration of therapies, application of advanced monitoring technologies and extensive interpretation of multiple databases.   Critical Care Time devoted to patient care services described in this note is  35  Minutes. This time reflects time of care of this signee Dr Jennet Maduro. This critical care time does not reflect procedure  time, or teaching time or supervisory time of PA/NP/Med student/Med Resident etc but could involve care discussion time.  Rush Farmer, M.D. [facility] Pulmonary/Critical Care Medicine. Pager: [phone]. After hours pager: [phone].

## 2018-10-24 NOTE — Progress Notes (Signed)
Nutrition Follow-up  DOCUMENTATION CODES:   Not applicable  INTERVENTION:   Pharmacy to adjust TPN to meet nutrition needs  Recommend trickle feedings via j-tube as able with Osmolite 1.5 @ 10 ml/hr  NUTRITION DIAGNOSIS:   Increased nutrient needs related to cancer and cancer related treatments as evidenced by estimated needs.  Ongoing  GOAL:   Patient will meet greater than or equal to 90% of their needs  Met with TPN  MONITOR:   TF tolerance, Labs  REASON FOR ASSESSMENT:   Consult Enteral/tube feeding initiation and management  ASSESSMENT:   Pt with PMH of HTN, HLD, GERD, DM, CAD and gastric cancer dx 03/2018 s/p chemo (FOLFOX) who is now s/p diagnostic laparoscopy, proximal gastrectomy with esophagogastrostomy, pyloroplasty, jejunostomy tube placement, intraoperative ultrasound, distal pancreatectomy, and splenectomy on 6/2.   6/2- s/p Procedure(s): Diagnostic laparoscopy, proximal gastrectomy with esophagogastrostomy, pyloroplasty, jejunostomy tube placement, intraoperative ultrasound, distal pancreatectomy, splenectomy 6/4- trickle feeds initiated 6/8- TF at goal 6/11- UGI reveals probable small leak 6/13- transferred from PCU to floor  6/15- NGT d/c 6/17- cardiac arrest with ischemic stomach s/p completion gastrectomy for ischemic stomach, wash out, and placement of a Abthera wound vac.  6/18 TPN started - 40 ml/hr without lipids. Provides: 1060 kcal and 52 grams protein. No need to withhold lipids  6/18 re-opening of recent laparotomy, removal of Abthera wound VAC, placement of drains and closure of fascia  6/24 TPN adv to goal    Patient is currently intubated on ventilator support MV: 12.1 L/min Temp (24hrs), Avg:98.9 F (37.2 C), Min:98 F (36.7 C), Max:99.6 F (37.6 C) MAP: 59  Medications reviewed and include: lasix, SSI Neo stopped today  Labs reviewed: Lactic acid 10.4 (H) CBG's: 193-217 Pt positive 2.2 L, moderate edema noted Drains x 4:  310 ml VAC: 250 ml  OG tube, ends in esophagus   Diet Order:   Diet Order            Diet NPO time specified  Diet effective now              EDUCATION NEEDS:   No education needs have been identified at this time  Skin:  Skin Assessment: Skin Integrity Issues: Skin Integrity Issues:: Incisions Incisions: open abd with VAC  Last BM:  10/15/18  Height:   Ht Readings from Last 1 Encounters:  10/16/18 '5\' 9"'$  (1.753 m)    Weight:   Wt Readings from Last 1 Encounters:  10/24/18 100.1 kg    Ideal Body Weight:  72.7 kg  BMI:  Body mass index is 32.59 kg/m.  Estimated Nutritional Needs:   Kcal:  2438  Protein:  135-175 grams  Fluid:  >2 L/day  Maylon Peppers RD, LDN, CNSC [phone] Pager [phone] After Hours Pager

## 2018-10-24 NOTE — Progress Notes (Signed)
TCTS DAILY ICU PROGRESS NOTE                   [address]            [place] [address]          [phone]   7 Days Post-Op Procedure(s) (LRB): RE-OPEN  LAPAROTOMY (N/A)  Total Length of Stay:  LOS: 23 days   Subjective: Does open his eyes and nods in response to my comments. Squeezes hands and wiggles toes on command.   Objective: Vital signs in last 24 hours: Temp:  [98.9 F (37.2 C)-99.8 F (37.7 C)] 99.6 F (37.6 C) (06/25 0000) Pulse Rate:  [50-108] 89 (06/25 0900) Cardiac Rhythm: Normal sinus rhythm (06/25 0800) Resp:  [0-40] 28 (06/25 0900) BP: (82-172)/(45-118) 131/65 (06/25 0900) SpO2:  [95 %-99 %] 96 % (06/25 0900) FiO2 (%):  [30 %] 30 % (06/25 0730) Weight:  [100.1 kg] 100.1 kg (06/25 0500)  Filed Weights   10/21/18 0353 10/23/18 0500 10/24/18 0500  Weight: 101.9 kg 100.8 kg 100.1 kg    Weight change: -0.7 kg      Intake/Output from previous day: 06/24 0701 - 06/25 0700 In: 4382.9 [I.V.:3629.2; IV Piggyback:753.8] Out: 3085 [Urine:2175; Drains:910]  Intake/Output this shift: Total I/O In: 301.2 [I.V.:295.1; IV Piggyback:6.1] Out: 750 [Urine:650; Drains:100]  Current Meds: Scheduled Meds: . chlorhexidine gluconate (MEDLINE KIT)  15 mL Mouth Rinse BID  . Chlorhexidine Gluconate Cloth  6 each Topical Daily  . Chlorhexidine Gluconate Cloth  6 each Topical Daily  . furosemide  40 mg Intravenous Q6H  . heparin injection (subcutaneous)  5,000 Units Subcutaneous Q8H  . insulin aspart  0-20 Units Subcutaneous Q4H  . mouth rinse  15 mL Mouth Rinse 10 times per day  . pantoprazole  40 mg Intravenous Q12H  . sodium chloride flush  10-40 mL Intracatheter Q12H   Continuous Infusions: . sodium chloride Stopped (10/24/18 0811)  . sodium chloride Stopped (10/24/18 0838)  . anidulafungin Stopped (10/24/18 0538)  . ceFEPime (MAXIPIME) IV Stopped (10/24/18 0701)  . dexmedetomidine (PRECEDEX) IV infusion 0.8 mcg/kg/hr (10/24/18 0900)  .  fentaNYL infusion INTRAVENOUS 25 mcg/hr (10/24/18 0900)  . metronidazole Stopped (10/24/18 2500)  . phenylephrine (NEO-SYNEPHRINE) Adult infusion 10 mcg/min (10/24/18 0900)  . TPN ADULT (ION) 80 mL/hr at 10/24/18 0900  . TPN ADULT (ION)     PRN Meds:.sodium chloride, sodium chloride, acetaminophen, albuterol, fentaNYL, fentaNYL (SUBLIMAZE) injection, midazolam, sodium chloride flush  General appearance: cooperative and no distress Heart: regular rate and rhythm, S1, S2 normal, no murmur, click, rub or gallop Lungs: clear to auscultation bilaterally and more respiratory effort today Abdomen: diminished bowel sounds Extremities: 2-3 + pitting lower and upper extremity edema   Lab Results: CBC: Recent Labs    10/23/18 0442 10/24/18 0800  WBC 20.9* 23.9*  HGB 8.1* 8.0*  HCT 26.6* 26.4*  PLT 274 276   BMET:  Recent Labs    10/23/18 0442 10/24/18 0500  NA 145 145  K 4.0 4.2  CL 111 116*  CO2 27 25  GLUCOSE 193* 203*  BUN 34* 36*  CREATININE 1.15 1.01  CALCIUM 7.8* 7.8*    CMET: Lab Results  Component Value Date   WBC 23.9 (H) 10/24/2018   HGB 8.0 (L) 10/24/2018   HCT 26.4 (L) 10/24/2018   PLT 276 10/24/2018   GLUCOSE 203 (H) 10/24/2018   CHOL 104 11/16/2017   TRIG 77 10/21/2018   HDL 39.60 11/16/2017  LDLCALC 45 11/16/2017   ALT 14 10/24/2018   AST 14 (L) 10/24/2018   NA 145 10/24/2018   K 4.2 10/24/2018   CL 116 (H) 10/24/2018   CREATININE 1.01 10/24/2018   BUN 36 (H) 10/24/2018   CO2 25 10/24/2018   PSA 1.36 08/03/2009   INR 1.4 (H) 10/16/2018   HGBA1C 5.8 (H) 09/25/2018   MICROALBUR 1.8 07/09/2009      PT/INR: No results for input(s): LABPROT, INR in the last 72 hours. Radiology: [place] Ekg Site Rite  Result Date: 10/23/2018 If [facility] image not attached, placement could not be confirmed due to current cardiac rhythm.    Assessment/Plan: S/P Procedure(s) (LRB): RE-OPEN  LAPAROTOMY (N/A)  Will continue to follow. Dr. Barry Dienes discussing goals of  care and prognosis with the family. Plan to CT later this week per general surgery.      Elgie Collard 10/24/2018 9:43 AM

## 2018-10-24 NOTE — Progress Notes (Addendum)
Porta cath needle was noted to be dislodged while infusing TPN.  TPN stopped, IV team consulted and D 10 started at the same rate of the TPN at 80 mL/hr per pharmacy.

## 2018-10-24 NOTE — Progress Notes (Signed)
Peripherally Inserted Central Catheter/Midline Placement  The IV Nurse has discussed with the patient and/or persons authorized to consent for the patient, the purpose of this procedure and the potential benefits and risks involved with this procedure.  The benefits include less needle sticks, lab draws from the catheter, and the patient may be discharged home with the catheter. Risks include, but not limited to, infection, bleeding, blood clot (thrombus formation), and puncture of an artery; nerve damage and irregular heartbeat and possibility to perform a PICC exchange if needed/ordered by physician.  Alternatives to this procedure were also discussed.  Bard Power PICC patient education guide, fact sheet on infection prevention and patient information card has been provided to patient /or left at bedside.    PICC/Midline Placement Documentation  PICC Triple Lumen 28/78/67 Left Basilic 46 cm 2 cm (Active)  Indication for Insertion or Continuance of Line Prolonged intravenous therapies 10/24/18 1312  Exposed Catheter (cm) 2 cm 10/24/18 1312  Site Assessment Clean;Dry;Intact 10/24/18 1312  Lumen #1 Status Flushed;Saline locked;Blood return noted 10/24/18 1312  Lumen #2 Status Saline locked;Blood return noted;Flushed 10/24/18 1312  Lumen #3 Status Flushed;Saline locked;Blood return noted 10/24/18 1312  Dressing Type Transparent;Securing device 10/24/18 1312  Dressing Status Clean;Dry;Intact;Antimicrobial disc in place 10/24/18 1312  Dressing Change Due 10/31/18 10/24/18 1312       Tristan Hughes 10/24/2018, 1:14 PM

## 2018-10-24 NOTE — Progress Notes (Deleted)
Wound vac hasn't been changed since placed on 6/17.  [facility] notified.  No new orders at this time.

## 2018-10-25 ENCOUNTER — Inpatient Hospital Stay (HOSPITAL_COMMUNITY): Payer: Medicare Other

## 2018-10-25 DIAGNOSIS — J9601 Acute respiratory failure with hypoxia: Secondary | ICD-10-CM

## 2018-10-25 LAB — CBC
HCT: 26.2 % — ABNORMAL LOW (ref 39.0–52.0)
Hemoglobin: 8 g/dL — ABNORMAL LOW (ref 13.0–17.0)
MCH: 27.3 pg (ref 26.0–34.0)
MCHC: 30.5 g/dL (ref 30.0–36.0)
MCV: 89.4 fL (ref 80.0–100.0)
Platelets: ADEQUATE 10*3/uL (ref 150–400)
RBC: 2.93 MIL/uL — ABNORMAL LOW (ref 4.22–5.81)
RDW: 18.2 % — ABNORMAL HIGH (ref 11.5–15.5)
WBC: 21.5 10*3/uL — ABNORMAL HIGH (ref 4.0–10.5)
nRBC: 1.4 % — ABNORMAL HIGH (ref 0.0–0.2)

## 2018-10-25 LAB — MAGNESIUM: Magnesium: 2.1 mg/dL (ref 1.7–2.4)

## 2018-10-25 LAB — GLUCOSE, CAPILLARY
Glucose-Capillary: 198 mg/dL — ABNORMAL HIGH (ref 70–99)
Glucose-Capillary: 194 mg/dL — ABNORMAL HIGH (ref 70–99)
Glucose-Capillary: 160 mg/dL — ABNORMAL HIGH (ref 70–99)
Glucose-Capillary: 153 mg/dL — ABNORMAL HIGH (ref 70–99)
Glucose-Capillary: 163 mg/dL — ABNORMAL HIGH (ref 70–99)
Glucose-Capillary: 178 mg/dL — ABNORMAL HIGH (ref 70–99)

## 2018-10-25 LAB — BASIC METABOLIC PANEL
Anion gap: 4 — ABNORMAL LOW (ref 5–15)
BUN: 37 mg/dL — ABNORMAL HIGH (ref 8–23)
CO2: 28 mmol/L (ref 22–32)
Calcium: 8 mg/dL — ABNORMAL LOW (ref 8.9–10.3)
Chloride: 114 mmol/L — ABNORMAL HIGH (ref 98–111)
Creatinine, Ser: 1.05 mg/dL (ref 0.61–1.24)
GFR calc Af Amer: >60 mL/min (ref >60)
GFR calc non Af Amer: >60 mL/min (ref >60)
Glucose, Bld: 189 mg/dL — ABNORMAL HIGH (ref 70–99)
Potassium: 3.7 mmol/L (ref 3.5–5.1)
Sodium: 146 mmol/L — ABNORMAL HIGH (ref 135–145)

## 2018-10-25 LAB — PHOSPHORUS: Phosphorus: 3 mg/dL (ref 2.5–4.6)

## 2018-10-25 LAB — PATHOLOGIST SMEAR REVIEW

## 2018-10-25 MED ORDER — TRACE MINERALS CR-CU-MN-SE-ZN 10-1000-500-60 MCG/ML IV SOLN
INTRAVENOUS | Status: DC
  Administered 2018-10-25: 19:00:00 via INTRAVENOUS
  Filled 2018-10-25: qty 998.4

## 2018-10-25 NOTE — Progress Notes (Signed)
8 Days Post-Op   Subjective/Chief Complaint: Long discussion with Jonni Sanger is on the phone today.  Dr. Barry Dienes contacted the family and discuss situation yesterday.  They have opted for terminal extubation.  He has multiple siblings and spouses that he wishes present for this event.  He states there are about 12 of them.  He is aware of the COVID restrictions at the hospital but is adamant that all are present for family religious rights.  I explained to him this is an Designer, jewellery and will contact the appropriate people to work on this.  They desire no further aggressive care at this point time but awaiting for family to arrive and wish to do this on Saturday.  He overall is stable at this point time with no significant change from overnight.   Objective: Vital signs in last 24 hours: Temp:  [98 F (36.7 C)-99.2 F (37.3 C)] 98.2 F (36.8 C) (06/26 0400) Pulse Rate:  [52-115] 52 (06/26 0722) Resp:  [16-42] 20 (06/26 0722) BP: (78-161)/(45-86) 141/54 (06/26 0722) SpO2:  [94 %-100 %] 99 % (06/26 0723) FiO2 (%):  [30 %] 30 % (06/26 0723) Last BM Date: 10/15/18  Intake/Output from previous day: 06/25 0701 - 06/26 0700 In: 1935.5 [I.V.:1490.6; IV Piggyback:344.9] Out: 7761 [Urine:7050; Drains:711] Intake/Output this shift: No intake/output data recorded.  General appearance: Opens eyes on vent but does not follow commands Incision/Wound: Wound VAC in place.  Bilious drainage noted from drains.  Lab Results:  Recent Labs    10/23/18 0442 10/24/18 0800  WBC 20.9* 23.9*  HGB 8.1* 8.0*  HCT 26.6* 26.4*  PLT 274 276   BMET Recent Labs    10/23/18 0442 10/24/18 0500  NA 145 145  K 4.0 4.2  CL 111 116*  CO2 27 25  GLUCOSE 193* 203*  BUN 34* 36*  CREATININE 1.15 1.01  CALCIUM 7.8* 7.8*   PT/INR No results for input(s): LABPROT, INR in the last 72 hours. ABG Recent Labs    10/23/18 0337  PHART 7.451*  HCO3 27.2    Studies/Results: Dg Chest Port 1  View  Result Date: 10/25/2018 CLINICAL DATA:  History of intubation. EXAM: PORTABLE CHEST 1 VIEW COMPARISON:  10/23/2018. FINDINGS: Endotracheal tube, NG tube, PowerPort catheter, left PICC line in stable position. Stable cardiomegaly. Diffuse bilateral pulmonary infiltrates/edema and left-sided pleural effusion again noted. Slight improvement in aeration on today's exam. No pneumothorax. IMPRESSION: 1.  Lines and tubes in stable position. 2. Stable cardiomegaly. Diffuse bilateral pulmonary infiltrates/edema and left-sided pleural effusion again noted. Slight improvement in aeration of both lung on today's exam. Electronically Signed   By: Marcello Moores  Register   On: 10/25/2018 08:06   [place] Ekg Site Rite  Result Date: 10/23/2018 If Site Rite image not attached, placement could not be confirmed due to current cardiac rhythm.   Anti-infectives: Anti-infectives (From admission, onward)   Start     Dose/Rate Route Frequency Ordered Stop   10/18/18 1300  metroNIDAZOLE (FLAGYL) IVPB 500 mg     500 mg 100 mL/hr over 60 Minutes Intravenous Every 8 hours 10/18/18 1230     10/18/18 1130  ceFEPIme (MAXIPIME) 2 g in sodium chloride 0.9 % 100 mL IVPB     2 g 200 mL/hr over 30 Minutes Intravenous Every 8 hours 10/18/18 1049     10/18/18 0300  anidulafungin (ERAXIS) 100 mg in sodium chloride 0.9 % 100 mL IVPB    Note to Pharmacy: Gross gastric contamination.  Coverage for yeast.  100 mg 78 mL/hr over 100 Minutes Intravenous Every 24 hours 10/17/18 0118     10/17/18 0130  anidulafungin (ERAXIS) 200 mg in sodium chloride 0.9 % 200 mL IVPB     200 mg 78 mL/hr over 200 Minutes Intravenous  Once 10/17/18 0119 10/17/18 0633   10/16/18 2342  vancomycin variable dose per unstable renal function (pharmacist dosing)  Status:  Discontinued      Does not apply See admin instructions 10/16/18 2342 10/18/18 0822   10/12/18 1800  vancomycin (VANCOCIN) 2,000 mg in sodium chloride 0.9 % 500 mL IVPB  Status:  Discontinued      2,000 mg 250 mL/hr over 120 Minutes Intravenous Every 24 hours 10/12/18 0958 10/16/18 2342   10/09/18 1800  vancomycin (VANCOCIN) 1,500 mg in sodium chloride 0.9 % 500 mL IVPB  Status:  Discontinued     1,500 mg 250 mL/hr over 120 Minutes Intravenous Every 24 hours 10/08/18 1648 10/12/18 0957   10/09/18 0200  piperacillin-tazobactam (ZOSYN) IVPB 3.375 g  Status:  Discontinued     3.375 g 12.5 mL/hr over 240 Minutes Intravenous Every 8 hours 10/08/18 1648 10/18/18 1040   10/08/18 1700  vancomycin (VANCOCIN) 1,500 mg in sodium chloride 0.9 % 500 mL IVPB     1,500 mg 250 mL/hr over 120 Minutes Intravenous  Once 10/08/18 1648 10/08/18 1924   10/08/18 1645  piperacillin-tazobactam (ZOSYN) IVPB 3.375 g     3.375 g 100 mL/hr over 30 Minutes Intravenous  Once 10/08/18 1638 10/08/18 1749   10/01/18 1600  ceFAZolin (ANCEF) IVPB 2g/100 mL premix     2 g 200 mL/hr over 30 Minutes Intravenous Every 8 hours 10/01/18 1539 10/01/18 1759   10/01/18 0631  ceFAZolin (ANCEF) 2-4 GM/100ML-% IVPB    Note to Pharmacy: Marga Melnick   : cabinet override      10/01/18 0631 10/01/18 0814   10/01/18 0630  ceFAZolin (ANCEF) IVPB 2g/100 mL premix     2 g 200 mL/hr over 30 Minutes Intravenous On call to O.R. 10/01/18 6712 10/01/18 0814      Assessment/Plan: s/p Procedure(s): RE-OPEN  LAPAROTOMY (N/A) s/pProcedure(s): RE-OPEN LAPAROTOMY (N/A) Acute respiratory failure 10/16/18 - intubated PEA arrest 10/16/18- 8 min CPR/ROSC Aspiration pneumonia Acute metabolic encephalopathy Moderate PCM Anemia Hx hypertension Hx CABG Hx type II diabetes   Adenocarcinoma the gastric cardia, pancreatic neuroendocrine tumor 1.Diagnostic laparoscopy, proximal gastrectomy with esophageal gastrostomy, pyloroplasty, jejunostomy, tube placement, intraoperative ultrasound, distal pancreatectomy, splenectomy 10/01/2018 Dr. Stark Klein. -UGI 10/08/2018 shows mild posterior leak- feeding via J tube 2.Reopening of recent  laparotomy and completion gastrectomy, placement of Abthera wound vac6/17/20 Dr.Byerly 3.Reopening of recent laparotomy, removal of Abthera wound vac, placement of drains, closure of fascia6/18/20, Dr. Barry Dienes  FEN: NPO/IV fluids/TPN ID: anidulafungin 6/19 >>day 4 Cefepime 6/19 >>day 4; Flagyl 6/19 >>day 4 DVT: SCD CALLED - UPDATED   son Jonni Sanger and discussed care.  They desire terminal extubation but need to get their family together.  He is adamant that the entire family is present during this time.  I explained that with COVID restrictions this is going to be extremely difficult.  He is adamant that the family needs to be with their father when he passes and have religious rights they wish to execute at that time.  I explained I would discuss with administration to see if there is a way to expedite their wishes.  He is upset about the communication issues and hopefully these will be less of a problem going forward.  We will try to expedite his wishes but I explained to him this is a decision that is not mine to make.  I told him I would try to help him the best that I could and then communicate to him later or have administrator talk to him directly.  He expresses appreciation at that.  It may be that the patient needs to be extubated in the ICU and moved to a floor bed where the family can be with him. WBC up reheck today Plan to CT scan later this week  Not many options at this point   LOS: 24 days    Joyice Faster Myna Freimark 10/25/2018

## 2018-10-25 NOTE — Progress Notes (Signed)
NAME:  Tristan Hughes, MRN:  [id number], DOB:  01/23/1941, LOS: 24 ADMISSION DATE:  10/01/2018, CONSULTATION DATE:  10/16/18 REFERRING MD:  Dr. Barry Dienes , CHIEF COMPLAINT:  Respiratory Distress   Brief History   78 y/o M admitted 6/2 for planned laparoscopy in setting of known adenocarcinoma of the GE junction.  He is s/p proximal gastrectomy with esophagogastrostomy, pyloroplasty, jejunostomy tube placement, distal pancreatectomy and splenectomy.  There were concerns for possible leak post operatively.  CT of the ABD 6/12 raised concern for gas/fluid in the LUQ.  He developed respiratory distress 6/17 and was transferred to ICU.  Post intubation suffered PEA arrest requiring ~ 8 minutes of CPR before ROSC. Repeat ABD imaging 6/17 with concerns for stomach necrosis. He returned to the OR for completion gastrectomy. Returned 6/18 for VAC change, exploration & multiple drain placement.   Past Medical History  OSA , CAD, HTN, HLD, GERD, Gastric Cancer - identified 03/2018, s/p three cycles of FOLFOX, DM II, Depression, Anxiety   Significant Hospital Events   6/02 Admit  6/17 PCCM consulted for resp distress, PEA arrest post intubation x2.  To OR for gastrectomy. CT concerning for stomach necrosis. 6/18 Off epi gtt.  To OR for VAC change, exploration and drain placement 6/21 Off pressors, start pressure support 6/24: Febrile, new leukocytosis  Consults:  TCTS 6/19   Procedures:  ETT 6/17 >> L IJ TLC 6/17 >>  L Radial Aline 6/17 >> 6/20  Significant Diagnostic Tests:  CT Abd w/o 6/12: surgical changes of proximal gastrectomy with primary anastomosis, with no evidence of enteric contrast leak/anastomic leak or stomach wall disruption after PO contrast. Surgical changes of distal pancreatectomy & splenectomy with persisting flocculent gas/fluid in the LUQ (DDx infection, with perforation / disruption at the surgical site or stomach not likely given the absence of extraluminal contrast) CT Chest /  ABD / Pelvis 6/17: dense bibasilar consolidation, nodular airspace disease in RML, bilateral upper lobes, large well-circumscribed flocculent gas collection in the LUQ with concern for rupture of the stomach and necrosis / abscess. Fluid & gas in the distal esophagus which extends high into the esophagus.  Progression of subcutaneous gas in the left chest wall & right chest wall  Micro Data:  BCx2 6/8 >> negative  UC 6/8 >> negative  Tracheal aspirate 6/17 >> E coli BCx2 6/17 >> negative  Antimicrobials:  Anidulafungin 6/18 >>  Cefepime 6/19 >> Flagyl 6/19>>>  Interim history/subjective:  No new changes. No o/n events. Low grade fevers continue.  Objective   Blood pressure (!) 134/53, pulse (!) 58, temperature 99.2 F (37.3 C), temperature source Axillary, resp. rate 20, height 5\' 9"  (1.753 m), weight 100.1 kg, SpO2 98 %.    Vent Mode: PRVC FiO2 (%):  [30 %] 30 % Set Rate:  [20 bmp] 20 bmp Vt Set:  [560 mL] 560 mL PEEP:  [5 cmH20] 5 cmH20 Plateau Pressure:  [14 cmH20-18 cmH20] 18 cmH20   Intake/Output Summary (Last 24 hours) at 10/25/2018 0926 Last data filed at 10/25/2018 0845 Gross per 24 hour  Intake 1634.38 ml  Output 7336 ml  Net -5701.62 ml   Filed Weights   10/21/18 0353 10/23/18 0500 10/24/18 0500  Weight: 101.9 kg 100.8 kg 100.1 kg    Examination:  General:  NAD.  Cachetic, ill-appearing.  HENT:[place]/AT, ETT in place Cardiac: RRR, no r/m/g Resp: CTA upper. Decreased BS L>R lower  Abdomen: Soft, non-tender distended. Multiple drainage tubes with green drainage. Wound vac in place,  hypoactive bowel sounds Extremities: +2 generalized edema, good cap refill  Skin: Warm, dry, intact Neuro: Sedated to a RASS -1, PERRLA intact, follows commands, moves all extremities GU: +3 scrotal edema  Resolved Hospital Problem list   PEA arrest, AKI, Lactic acidosis, Thrombocytosis, Septic shock, ARDS, Hypophosphatemia  Assessment & Plan:   Sepsis from necrotic stomach with  peritonitis and aspiration pneumonia with E coli. WBC 23.9; 6/19 BAL: E. Coli, 6/22 BC: negative Plan: Day 17/21 of ABX, currently on cefepime and flagyl,  Day 8 of anidulafungin Trend WBCs Fever broke, continue to trend  Per Surgery, discussion with family today for terminal extubation. Perhaps 6/27?  Acute hypoxic respiratory failure with from aspiration pneumonia. Plan: Full vent support VAP bundle Goal SpO2: 88-95%  current clinical condition does not support extubation trial  Adenocarcinoma of GE Junction s/p Laparoscopy with Gastrectomy. Discussion: 6/2 Proximal gastrectomy with esophagogastrostomy, pyloroplasty, jejunostomy tube placement, distal pancreatectomy and splenectomy Completed 3 cycles of FOLFOX 6/12 CT Abd concern for LUQ gas/fluid > back to OR for gastrectomy 6/18 Abd closed Plan: Post-op care per surgery TCTS consulted Do not manipulate NG tube Continue protonix  Acute metabolic encephalopathy. Plan: RASS goal: 0 to -1 Continue precedex and minimize fentanyl drip  Moderate Protein Calorie Malnutrition Plan: Continue TPN Defer TPN management to Pharm  Anemia of critical illness. Hgb 8.0 Plan: Trend CBCs Transfer for Hgb <7  Hypertension, Hyperlipidemia, CAD s/p CABG. Stable Plan: Phenyleprine for SBP > 90 Hold anti-hypertensive home meds   Diabetes Mellitis, type II Persists Plan: SSI and low dose basal    Best practice:  Diet: TPN DVT prophylaxis: SQ heparin GI prophylaxis: Protonix Mobility: bed rest Code Status: Full Code  Disposition: ICU   I have independently seen and examined the patient, reviewed data, and developed an assessment and plan. A total of 36 minutes were spent in critical care assessment and medical decision making. This critical care time does not reflect procedure time, or teaching time or supervisory time of PA/NP/Med student/Med Resident, etc but could involve care discussion time.  Bonna Gains, MD PhD  10/25/18 9:32 AM

## 2018-10-25 NOTE — Progress Notes (Signed)
Oncology has been monitoring the patient's inpatient status and periodically stopping by to offer support. I went by the patient's bedside on 10/24/2018 and there was no family present. I reviewed the patient's records this morning, including Dr. Josetta Huddle note, that the patient's family had made the difficult decision for palliative extubation.  I spoke with the patient's wife and daughter this morning. They were at peace with the difficult decision, and felt that it was the right thing to do. I offered emotional support to the family. They were appreciative of the care that the patient had received so far. They did not have any questions at this time.  Thank you for caring for Tristan Hughes in this difficult situation. Please do not hesitate to contact us if there is anything we can assist with.  Tristan Men, MD 10/25/2018 11:43 AM

## 2018-10-25 NOTE — Care Management (Addendum)
CM was contacted by [facility] supervisor to address concerns communicated by Patient Experience liaison Shelly.  CM spoke with [facility] - request made for pt to transport home with vent and then extubate to comfort care in the home with hospice.  CM followed back up with unit director to see if there is any accommodation that can be made within cone to accommodate  family during extubation.  .Unit director to reach out to attending to clarify if this is even safe for the patient.    As requested during 1400 hour  CM contacted [facility] - Update [id number]:  [facility] liaison informed CM that agency doctor is "open" to further discussing the plan detailed above on Monday if plan still desired on 6/29 as this plan will require much planning and detail.  Per agency ; Potential acceptance requires considerable collaboration and therefore can not be completed over the weekend due to complexity.  Per agency planning to include but not limited to:  additional staffing of registered nurse and respiratory therapist at the time of extubation, switch from current ventilator to portable ventilator supplied through hospice, etc....   CM updated unit director of above information immediately.     CM also contacted [facility] and spoke with Liaison Barbera Setters - agency will not be able to provide service  CM also contacted [facility] - agency can not provide service.

## 2018-10-25 NOTE — Progress Notes (Signed)
      [address]       [place] [address]             [phone]      8 Days Post-Op Procedure(s) (LRB): RE-OPEN  LAPAROTOMY (N/A) Subjective: Opens eyes slightly but appears more drowsy today. Squeezed my hand and wiggled toes on command.   Objective: Vital signs in last 24 hours: Temp:  [98 F (36.7 C)-99.2 F (37.3 C)] 99.2 F (37.3 C) (06/26 0723) Pulse Rate:  [52-115] 59 (06/26 0945) Cardiac Rhythm: Normal sinus rhythm (06/26 0800) Resp:  [16-42] 20 (06/26 0945) BP: (78-161)/(44-86) 136/49 (06/26 0945) SpO2:  [94 %-100 %] 99 % (06/26 0945) FiO2 (%):  [30 %] 30 % (06/26 0723)     Intake/Output from previous day: 06/25 0701 - 06/26 0700 In: 1935.5 [I.V.:1490.6; IV Piggyback:344.9] Out: 7761 [Urine:7050; Drains:711] Intake/Output this shift: Total I/O In: -  Out: 415 [Urine:325; Drains:90]  General appearance: intubated and sedated Heart: regular rate and rhythm, S1, S2 normal, no murmur, click, rub or gallop Lungs: clear to auscultation bilaterally Abdomen: soft Extremities: 2-3+ pitting edema in upper and lower extremities Wound: c/d/i  Lab Results: Recent Labs    10/23/18 0442 10/24/18 0800  WBC 20.9* 23.9*  HGB 8.1* 8.0*  HCT 26.6* 26.4*  PLT 274 276   BMET:  Recent Labs    10/23/18 0442 10/24/18 0500  NA 145 145  K 4.0 4.2  CL 111 116*  CO2 27 25  GLUCOSE 193* 203*  BUN 34* 36*  CREATININE 1.15 1.01  CALCIUM 7.8* 7.8*    PT/INR: No results for input(s): LABPROT, INR in the last 72 hours. ABG    Component Value Date/Time   PHART 7.451 (H) 10/23/2018 0337   HCO3 27.2 10/23/2018 0337   TCO2 28 10/23/2018 0337   ACIDBASEDEF 2.0 10/17/2018 1544   O2SAT 93.0 10/23/2018 0337   CBG (last 3)  Recent Labs    10/24/18 2303 10/25/18 0309 10/25/18 0728  GLUCAP 231* 198* 194*    Assessment/Plan: S/P Procedure(s) (LRB): RE-OPEN  LAPAROTOMY (N/A)  Will continue to follow. Per Dr. Josetta Huddle note terminal extubation  planned for tomorrow with as many family present as able.    LOS: 24 days    Elgie Collard 10/25/2018

## 2018-10-25 NOTE — Progress Notes (Signed)
[place] CONSULT NOTE   Pharmacy Consult for TPN Indication: post-gastrectomy  Patient Measurements: Height: 5\' 9"  (175.3 cm) Weight: 220 lb 10.9 oz (100.1 kg) IBW/kg (Calculated) : 70.7 TPN AdjBW (KG): 76.7 Body mass index is 32.59 kg/m. Usual Weight: ~220 lbs  Assessment:  78 yo M admitted on 6/2 for laparoscopy in the setting of known adenocarcinoma of the GE junction. S/p proximal gastrectomy with esophagogastrostomy, pyloroplasty, jejunostomy tube placement, distal pancreatectomy and splenectomy. Developed respiratory distress requiring intubation and a PEA arrest on 6/17. Repeat abdominal imaging 6/17 with concern for stomach necrosis. S/p complete gastrectomy and VAC change 6/18. Has been receiving tube feeds since 6/4 (stopped 6/18) and has moderate protein caloric malnutrition.   GI: TF 6/4>>6/18, albumin 1.8, prealbumin 5.3, CT planned soon Endo: Hx DM, now s/p distal pancreatectomy. Current cbgs in the 190-230s. Insulin requirements in the past 24 hours: 32 units Novolog + 60 units regular insulin in TPN Lytes: Na 146, K 3.7, Cl 116>114 (max acetate), phos 3, Mg 2.1 Renal: AKI resolving. SCr 1.05, UOP 2.9 ml/kg/hr Pulm: intubated 6/17 for acute hypoxemic respiratory failure, possible aspiration, moderate ARDS. Cards: s/p PEA arrest on 6/17. MAP 70s Requiring Neo. Hepatobil: AST/ALT 14/14, tbili wnl Neuro: sedated on fentanyl and versed. RASS -3.  ID: abx for aspiration PNA and peritonitis- Ecoli intermediate to Zosyn>> changed to Cefepime + Flagyl and continuing Eraxis. WBC 24 >21.5, Tm 99  TPN Access: L-IJ 6/17 TPN start date: 6/18 Nutritional Goals (per RD recommendations on 6/18) KCal: 2438 kcal Protein: 135-175 g Fluid: >2,000 mL  Current Nutrition: tube feeds off 6/17, TPN initiated 6/18  Plan:  -Continue TPN at 80 mL/hr -This TPN provides 150 g of protein, 307 g of dextrose, and 65 g of lipids which provides 2297 kCals per  day, meeting ~94% of kcal needs and 100% of protein needs -Electrolytes in TPN: Further Na reduction, other electrolytes to standard chloride/acetate to max acetate -Add MVI to TPN -Trace elements to be added only MWF due to national shortage -Increase insulin again to 75 units - f/u Jerauld, PharmD Clinical Pharmacist Please check AMION for all [place] numbers 10/25/2018 7:32 AM

## 2018-10-26 LAB — GLUCOSE, CAPILLARY
Glucose-Capillary: 164 mg/dL — ABNORMAL HIGH (ref 70–99)
Glucose-Capillary: 132 mg/dL — ABNORMAL HIGH (ref 70–99)

## 2018-10-26 MED ORDER — ACETAMINOPHEN 325 MG PO TABS: 650.0000 mg | ORAL_TABLET | Freq: Four times a day (QID) | ORAL | Status: DC | PRN

## 2018-10-26 MED ORDER — ONDANSETRON 4 MG PO TBDP: 4.0000 mg | ORAL_TABLET | Freq: Three times a day (TID) | ORAL | 0 refills | Status: AC | PRN

## 2018-10-26 MED ORDER — FUROSEMIDE 10 MG/ML IJ SOLN
40.0000 mg | Freq: Once | INTRAMUSCULAR | Status: AC
  Administered 2018-10-26: 40 mg via INTRAVENOUS
  Filled 2018-10-26: qty 4

## 2018-10-26 MED ORDER — MORPHINE SULFATE 20 MG/5ML PO SOLN: 10.0000 mg | ORAL | 0 refills | Status: DC | PRN

## 2018-10-26 MED ORDER — GLYCOPYRROLATE 1 MG PO TABS
1.0000 mg | ORAL_TABLET | ORAL | Status: DC | PRN
  Filled 2018-10-26: qty 1

## 2018-10-26 MED ORDER — DEXTROSE 5 % IV SOLN: INTRAVENOUS | Status: DC

## 2018-10-26 MED ORDER — MORPHINE 100MG IN NS 100ML (1MG/ML) PREMIX INFUSION
0.0000 mg/h | INTRAVENOUS | Status: DC
  Administered 2018-10-26: 15:00:00 2 mg/h via INTRAVENOUS
  Filled 2018-10-26: qty 100

## 2018-10-26 MED ORDER — DIPHENHYDRAMINE HCL 50 MG/ML IJ SOLN: 25.0000 mg | INTRAMUSCULAR | Status: DC | PRN

## 2018-10-26 MED ORDER — MORPHINE SULFATE (CONCENTRATE) 20 MG/ML PO SOLN: 10.0000 mg | ORAL | 0 refills | Status: DC | PRN

## 2018-10-26 MED ORDER — MORPHINE SULFATE (CONCENTRATE) 20 MG/ML PO SOLN: 10.0000 mg | ORAL | 0 refills | Status: AC | PRN

## 2018-10-26 MED ORDER — POLYVINYL ALCOHOL 1.4 % OP SOLN
1.0000 [drp] | Freq: Four times a day (QID) | OPHTHALMIC | Status: DC | PRN
  Filled 2018-10-26: qty 15

## 2018-10-26 MED ORDER — MORPHINE SULFATE (PF) 2 MG/ML IV SOLN
2.0000 mg | INTRAVENOUS | Status: DC | PRN
  Administered 2018-10-26: 2 mg via INTRAVENOUS
  Filled 2018-10-26: qty 1

## 2018-10-26 MED ORDER — ACETAMINOPHEN 650 MG RE SUPP: 650.0000 mg | Freq: Four times a day (QID) | RECTAL | Status: DC | PRN

## 2018-10-26 MED ORDER — GLYCOPYRROLATE 0.2 MG/ML IJ SOLN: 0.2000 mg | INTRAMUSCULAR | Status: DC | PRN

## 2018-10-26 MED ORDER — STERILE WATER FOR INJECTION IV SOLN
INTRAVENOUS | Status: DC
  Filled 2018-10-26: qty 1060.8

## 2018-10-26 MED ORDER — ONDANSETRON 4 MG PO TBDP: 4.0000 mg | ORAL_TABLET | Freq: Three times a day (TID) | ORAL | 0 refills | Status: DC | PRN

## 2018-10-26 NOTE — Care Management (Signed)
Patient's family has decided to take patient home tonight.  They have been advised by Anderson Malta that the first nurse visit will be tomorrow morning and there will be no equipment (bed) delivered until tomorrow.  The family wants patient home as soon as possible.  RN Estill Bamberg will obtain yellow DNR to send with PTAR.  [place] form completed and left with Estill Bamberg.  PTAR scheduled for 7pm or after.  Estill Bamberg will obtain d/c orders and comfort medication prescriptions.

## 2018-10-26 NOTE — Procedures (Signed)
Extubation Procedure Note  Patient Details:   Name: Tristan Hughes DOB: 1940-11-08 MRN: [id number]   Airway Documentation:    Vent end date: 10/26/18 Vent end time: 1050   Evaluation  O2 sats: stable throughout Complications: No apparent complications Patient did tolerate procedure well. Bilateral Breath Sounds: Rhonchi   Yes   Pt extubated per MD. Pt placed on 2L [place] for comfort.  RN at bedside, RT will continue to monitor. Pt is a DNR.   Pierre Bali 10/26/2018, 10:59 AM

## 2018-10-26 NOTE — Progress Notes (Signed)
Progress Note: General Surgery Service   Assessment/Plan: Active Problems:   Gastric carcinoma (HCC)   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HCC)   Cardiac arrest ([place])   Acute respiratory failure with hypoxia ([place])  s/p Procedure(s): RE-OPEN  LAPAROTOMY 10/17/2018  1.Diagnostic laparoscopy, proximal gastrectomy with esophageal gastrostomy, pyloroplasty, jejunostomy, tube placement, intraoperative ultrasound, distal pancreatectomy, splenectomy 10/01/2018 Dr. Stark Klein. -UGI 10/08/2018 shows mild posterior leak- feeding via J tube 2.Reopening of recent laparotomy and completion gastrectomy, placement of Abthera wound vac6/17/20 Dr.Byerly 3.Reopening of recent laparotomy, removal of Abthera wound vac, placement of drains, closure of fascia6/18/20, Dr. Barry Dienes  FEN: NPO/IV fluids/TPN ID: anidulafungin 6/19 >>day 4 Cefepime 6/19 >>day 4; Flagyl 6/19 >>day 4 DVT: SCD  Plan for extubation today toward comfort care, also will stop TPN   LOS: 25 days  Chief Complaint/Subjective: Continued discussions about extubation with family  Objective: Vital signs in last 24 hours: Temp:  [99 F (37.2 C)-100 F (37.8 C)] 99 F (37.2 C) (06/27 0400) Pulse Rate:  [50-70] 50 (06/27 0800) Resp:  [16-26] 20 (06/27 0800) BP: (84-136)/(42-92) 102/45 (06/27 0800) SpO2:  [91 %-100 %] 98 % (06/27 0800) FiO2 (%):  [30 %] 30 % (06/27 0327) Weight:  [100.1 kg] 100.1 kg (06/27 0500) Last BM Date: 10/15/18  Intake/Output from previous day: 06/26 0701 - 06/27 0700 In: 3660.3 [I.V.:3069.1; IV Piggyback:591.2] Out: 2525 [Urine:1960; Drains:565] Intake/Output this shift: Total I/O In: 2313.5 [I.V.:1784.6; IV Piggyback:528.9] Out: -   Lungs: assisted  Cardiovascular: bradycardic  Abd: soft, vac in place  Extremities: no edema  Neuro: GCS 11t  Lab Results: CBC  Recent Labs    10/24/18 0800 10/25/18 0937  WBC 23.9* 21.5*  HGB 8.0* 8.0*  HCT 26.4* 26.2*  PLT 276 PLATELET  CLUMPS NOTED ON SMEAR, COUNT APPEARS ADEQUATE   BMET Recent Labs    10/24/18 0500 10/25/18 0937  NA 145 146*  K 4.2 3.7  CL 116* 114*  CO2 25 28  GLUCOSE 203* 189*  BUN 36* 37*  CREATININE 1.01 1.05  CALCIUM 7.8* 8.0*   PT/INR No results for input(s): LABPROT, INR in the last 72 hours. ABG No results for input(s): PHART, HCO3 in the last 72 hours.  Invalid input(s): PCO2, PO2  Studies/Results:  Anti-infectives: Anti-infectives (From admission, onward)   Start     Dose/Rate Route Frequency Ordered Stop   10/18/18 1300  metroNIDAZOLE (FLAGYL) IVPB 500 mg     500 mg 100 mL/hr over 60 Minutes Intravenous Every 8 hours 10/18/18 1230     10/18/18 1130  ceFEPIme (MAXIPIME) 2 g in sodium chloride 0.9 % 100 mL IVPB     2 g 200 mL/hr over 30 Minutes Intravenous Every 8 hours 10/18/18 1049     10/18/18 0300  anidulafungin (ERAXIS) 100 mg in sodium chloride 0.9 % 100 mL IVPB    Note to Pharmacy: Gross gastric contamination.  Coverage for yeast.   100 mg 78 mL/hr over 100 Minutes Intravenous Every 24 hours 10/17/18 0118     10/17/18 0130  anidulafungin (ERAXIS) 200 mg in sodium chloride 0.9 % 200 mL IVPB     200 mg 78 mL/hr over 200 Minutes Intravenous  Once 10/17/18 0119 10/17/18 0633   10/16/18 2342  vancomycin variable dose per unstable renal function (pharmacist dosing)  Status:  Discontinued      Does not apply See admin instructions 10/16/18 2342 10/18/18 0822   10/12/18 1800  vancomycin (VANCOCIN) 2,000 mg in sodium chloride 0.9 % 500 mL  IVPB  Status:  Discontinued     2,000 mg 250 mL/hr over 120 Minutes Intravenous Every 24 hours 10/12/18 0958 10/16/18 2342   10/09/18 1800  vancomycin (VANCOCIN) 1,500 mg in sodium chloride 0.9 % 500 mL IVPB  Status:  Discontinued     1,500 mg 250 mL/hr over 120 Minutes Intravenous Every 24 hours 10/08/18 1648 10/12/18 0957   10/09/18 0200  piperacillin-tazobactam (ZOSYN) IVPB 3.375 g  Status:  Discontinued     3.375 g 12.5 mL/hr over 240  Minutes Intravenous Every 8 hours 10/08/18 1648 10/18/18 1040   10/08/18 1700  vancomycin (VANCOCIN) 1,500 mg in sodium chloride 0.9 % 500 mL IVPB     1,500 mg 250 mL/hr over 120 Minutes Intravenous  Once 10/08/18 1648 10/08/18 1924   10/08/18 1645  piperacillin-tazobactam (ZOSYN) IVPB 3.375 g     3.375 g 100 mL/hr over 30 Minutes Intravenous  Once 10/08/18 1638 10/08/18 1749   10/01/18 1600  ceFAZolin (ANCEF) IVPB 2g/100 mL premix     2 g 200 mL/hr over 30 Minutes Intravenous Every 8 hours 10/01/18 1539 10/01/18 1759   10/01/18 0631  ceFAZolin (ANCEF) 2-4 GM/100ML-% IVPB    Note to Pharmacy: Marga Melnick   : cabinet override      10/01/18 0631 10/01/18 0814   10/01/18 0630  ceFAZolin (ANCEF) IVPB 2g/100 mL premix     2 g 200 mL/hr over 30 Minutes Intravenous On call to O.R. 10/01/18 6394 10/01/18 3200      Medications: Scheduled Meds: . chlorhexidine gluconate (MEDLINE KIT)  15 mL Mouth Rinse BID  . Chlorhexidine Gluconate Cloth  6 each Topical Daily  . Chlorhexidine Gluconate Cloth  6 each Topical Daily  . heparin injection (subcutaneous)  5,000 Units Subcutaneous Q8H  . insulin aspart  0-20 Units Subcutaneous Q4H  . mouth rinse  15 mL Mouth Rinse 10 times per day  . pantoprazole  40 mg Intravenous Q12H  . sodium chloride flush  10-40 mL Intracatheter Q12H  . sodium chloride flush  10-40 mL Intracatheter Q12H   Continuous Infusions: . sodium chloride 5 mL/hr at 10/24/18 1720  . sodium chloride 80 mL/hr at 10/24/18 1805  . anidulafungin Stopped (10/26/18 0421)  . ceFEPime (MAXIPIME) IV Stopped (10/26/18 0708)  . dexmedetomidine (PRECEDEX) IV infusion 0.7 mcg/kg/hr (10/26/18 0800)  . fentaNYL infusion INTRAVENOUS 25 mcg/hr (10/26/18 0800)  . metronidazole Stopped (10/26/18 0630)  . phenylephrine (NEO-SYNEPHRINE) Adult infusion 20 mcg/min (10/26/18 0800)  . TPN ADULT (ION) 80 mL/hr at 10/26/18 0800   PRN Meds:.sodium chloride, sodium chloride, acetaminophen, albuterol,  fentaNYL, fentaNYL (SUBLIMAZE) injection, midazolam, sodium chloride flush, sodium chloride flush  Mickeal Skinner, MD [facility]

## 2018-10-26 NOTE — Care Management (Signed)
Notified by RN, Estill Bamberg, that patient was extubated this am and appears stable at this time.  Family would like for him to be discharged home with hospice.  Anderson Malta with [facility] was contacted to investigate the earliest discharge whether tonight or tomorrow morning.

## 2018-10-26 NOTE — Progress Notes (Signed)
NAME:  Tristan Hughes, MRN:  [id number], DOB:  Mar 20, 1941, LOS: 71 ADMISSION DATE:  10/01/2018, CONSULTATION DATE:  10/16/18 REFERRING MD:  Dr. Barry Dienes , CHIEF COMPLAINT:  Respiratory Distress   Brief History   78 y/o M admitted 6/2 for planned laparoscopy in setting of known adenocarcinoma of the GE junction.  He is s/p proximal gastrectomy with esophagogastrostomy, pyloroplasty, jejunostomy tube placement, distal pancreatectomy and splenectomy.  There were concerns for possible leak post operatively.  CT of the ABD 6/12 raised concern for gas/fluid in the LUQ.  He developed respiratory distress 6/17 and was transferred to ICU.  Post intubation suffered PEA arrest requiring ~ 8 minutes of CPR before ROSC. Repeat ABD imaging 6/17 with concerns for stomach necrosis. He returned to the OR for completion gastrectomy. Returned 6/18 for VAC change, exploration & multiple drain placement.   Past Medical History  OSA , CAD, HTN, HLD, GERD, Gastric Cancer - identified 03/2018, s/p three cycles of FOLFOX, DM II, Depression, Anxiety   Significant Hospital Events   6/02 Admit  6/17 PCCM consulted for resp distress, PEA arrest post intubation x2.  To OR for gastrectomy. CT concerning for stomach necrosis. 6/18 Off epi gtt.  To OR for VAC change, exploration and drain placement 6/21 Off pressors, start pressure support 6/24: Febrile, new leukocytosis 6/26: Palliative care conversation - plan for 1-way extubation on 6/27.  Consults:  TCTS 6/19   Procedures:  ETT 6/17 >> L IJ TLC 6/17 >>  L Radial Aline 6/17 >> 6/20  Significant Diagnostic Tests:  CT Abd w/o 6/12: surgical changes of proximal gastrectomy with primary anastomosis, with no evidence of enteric contrast leak/anastomic leak or stomach wall disruption after PO contrast. Surgical changes of distal pancreatectomy & splenectomy with persisting flocculent gas/fluid in the LUQ (DDx infection, with perforation / disruption at the surgical site or  stomach not likely given the absence of extraluminal contrast) CT Chest / ABD / Pelvis 6/17: dense bibasilar consolidation, nodular airspace disease in RML, bilateral upper lobes, large well-circumscribed flocculent gas collection in the LUQ with concern for rupture of the stomach and necrosis / abscess. Fluid & gas in the distal esophagus which extends high into the esophagus.  Progression of subcutaneous gas in the left chest wall & right chest wall  Micro Data:  BCx2 6/8 >> negative  UC 6/8 >> negative  Tracheal aspirate 6/17 >> E coli BCx2 6/17 >> negative  Antimicrobials:  Anidulafungin 6/18 >>  Cefepime 6/19 >> Flagyl 6/19>>>  Interim history/subjective:  Awake today and following commands. Denies pain.  Objective   Blood pressure (!) 102/45, pulse (!) 55, temperature 98.2 F (36.8 C), temperature source Axillary, resp. rate 20, height 5\' 9"  (1.753 m), weight 100.1 kg, SpO2 99 %.    Vent Mode: PRVC FiO2 (%):  [30 %] 30 % Set Rate:  [20 bmp] 20 bmp Vt Set:  [560 mL] 560 mL PEEP:  [5 cmH20] 5 cmH20 Plateau Pressure:  [16 cmH20-21 cmH20] 19 cmH20   Intake/Output Summary (Last 24 hours) at 10/26/2018 0901 Last data filed at 10/26/2018 0800 Gross per 24 hour  Intake 5973.81 ml  Output 2110 ml  Net 3863.81 ml   Filed Weights   10/23/18 0500 10/24/18 0500 10/26/18 0500  Weight: 100.8 kg 100.1 kg 100.1 kg    Examination:  General:  NAD.  Generalized edema HENT:[place]/AT, ETT in place with no pressure ulceration. Cardiac: RRR, no r/m/g, extremities warm. Resp: chest clear bilaterally. Able to tolerate PSV 10/5  without tachypnea. Abdomen: Soft, non-tender distended. Multiple drainage tubes with green drainage. Wound vac in place, hypoactive bowel sounds Extremities: +2 generalized edema, good cap refill  Skin: Warm, dry, intact Neuro: Sedated to a RASS -1, PERRLA intact, follows commands, moves all extremities GU: +3 scrotal edema  Resolved Hospital Problem list   PEA  arrest, AKI, Lactic acidosis, Thrombocytosis, Septic shock, ARDS, Hypophosphatemia  Assessment & Plan:   Critically ill due to septic shock from necrotic stomach with peritonitis and aspiration pneumonia with E coli, requiring titration of phenylephrine to maintain MAP>65. Ongoing leukocytosis.  Pressor requirements may be in part to sedative infusions. Plan: Continue current antimicrobial therapy. Continue to titrate vasopressors. Requirements may drop significantly post extubation.  Critically ill due to acute hypoxic respiratory failure with from aspiration pneumonia. Plan: Tolerating SBT at higher support level.  May actually tolerate extubation as planned by Palliative care   Adenocarcinoma of GE Junction s/p Laparoscopy with Gastrectomy. Discussion: 6/2 Proximal gastrectomy with esophagogastrostomy, pyloroplasty, jejunostomy tube placement, distal pancreatectomy and splenectomy Completed 3 cycles of FOLFOX 6/12 CT Abd concern for LUQ gas/fluid > back to OR for gastrectomy 6/18 Abd closed Plan: Post-op care per surgery TCTS consulted Do not manipulate NG tube Continue protonix  Acute metabolic encephalopathy. Plan: RASS goal: 0 to -1 Continue precedex and minimize fentanyl drip  Moderate Protein Calorie Malnutrition Plan: Continue TPN Defer TPN management to Pharm  Anemia of critical illness. Hgb 8.0 Plan: Trend CBCs Transfer for Hgb <7  Hypertension, Hyperlipidemia, CAD s/p CABG. Stable Plan: Phenyleprine for SBP > 90 Hold anti-hypertensive home meds   Diabetes Mellitis, type II Persists Plan: SSI and low dose basal    Best practice:  Diet: TPN DVT prophylaxis: SQ heparin GI prophylaxis: Protonix Mobility: bed rest Code Status: Full Code  Disposition: ICU   CRITICAL CARE Performed by: Kipp Brood   Total critical care time: 35 minutes  Critical care time was exclusive of separately billable procedures and treating other patients.   Critical care was necessary to treat or prevent imminent or life-threatening deterioration.  Critical care was time spent personally by me on the following activities: development of treatment plan with patient and/or surrogate as well as nursing, discussions with consultants, evaluation of patient's response to treatment, examination of patient, obtaining history from patient or surrogate, ordering and performing treatments and interventions, ordering and review of laboratory studies, ordering and review of radiographic studies, pulse oximetry, re-evaluation of patient's condition and participation in multidisciplinary rounds.  Kipp Brood, MD [facility] ICU Physician [place]  Pager: [phone] Mobile: [phone] After hours: [phone].     10/26/18 9:01 AM

## 2018-10-26 NOTE — Progress Notes (Signed)
[place] CONSULT NOTE   Pharmacy Consult for TPN Indication: post-gastrectomy  Patient Measurements: Height: 5\' 9"  (175.3 cm) Weight: 220 lb 10.9 oz (100.1 kg) IBW/kg (Calculated) : 70.7 TPN AdjBW (KG): 76.7 Body mass index is 32.59 kg/m. Usual Weight: ~220 lbs  Assessment:  78 yo M admitted on 6/2 for laparoscopy in the setting of known adenocarcinoma of the GE junction. S/p proximal gastrectomy with esophagogastrostomy, pyloroplasty, jejunostomy tube placement, distal pancreatectomy and splenectomy. Developed respiratory distress requiring intubation and a PEA arrest on 6/17. Repeat abdominal imaging 6/17 with concern for stomach necrosis. S/p complete gastrectomy and VAC change 6/18. Has been receiving tube feeds since 6/4 (stopped 6/18) and has moderate protein caloric malnutrition.   GI: TF 6/4>>6/18, albumin 1.8, prealbumin 5.3, CT planned soon Endo: Hx DM, now s/p distal pancreatectomy. Current cbgs in the 130-180s. Insulin requirements in the past 24 hours: 23 units Novolog + 75 units regular insulin in TPN Lytes: Na 146, K 3.7, Cl 116>114 (max acetate), phos 3, Mg 2.1 Renal: AKI resolving. SCr 1.05, UOP 0.8 ml/kg/hr, lasix x1 Pulm: intubated 6/17 for acute hypoxemic respiratory failure, possible aspiration, moderate ARDS. Cards: s/p PEA arrest on 6/17. MAP upper 60s Requiring Neo. Hepatobil: AST/ALT 14/14, tbili wnl Neuro: sedated on fentanyl and versed. RASS -1.  ID: abx for aspiration PNA and peritonitis- Ecoli intermediate to Zosyn>> changed to Cefepime + Flagyl and continuing Eraxis. WBC 24 >21.5, Tm 99  TPN Access: L-IJ 6/17 TPN start date: 6/18 Nutritional Goals (per RD recommendations on 6/18) KCal: 2438 kcal Protein: 135-175 g Fluid: >2,000 mL  Current Nutrition: tube feeds off 6/17, TPN initiated 6/18  Plan:  -Increase TPN to 85 mL/hr -This TPN provides 159 g of protein, 326 g of dextrose, and 69 g of lipids which provides  2439 kCals per day, meeting 100% of needs -Electrolytes in TPN: Continue reduced Na, other electrolytes to standard chloride/acetate to max acetate -Add MVI to TPN -Trace elements to be added only MWF due to national shortage -Continue insulin in TPN 75 units -BMP in AM  Bertis Ruddy, PharmD Clinical Pharmacist Please check AMION for all [place] numbers 10/26/2018 9:02 AM

## 2018-10-26 NOTE — Progress Notes (Signed)
[facility]  RN Staff--once ambulance transport has arrived, please call [phone] to advise he is leaving so they can send a nurse to the home after hours.    Thank you, Venia Carbon

## 2018-10-26 NOTE — Progress Notes (Signed)
[facility])  Received referral for immediate discharge so pt can pass at his home.  RN Case manager will arrange transportation home.  Family has advised that they do not care if he does not have any DME, they want to take him home now.  [facility] is trying to arrange an immediate needs visit.  Please send comfort prescriptions home with him, for pain and anxiety.  Thank you, Venia Carbon RN, BSN, [place] [facility] Liaison

## 2018-10-26 NOTE — Progress Notes (Signed)
Manufacturing engineer ([facility])  Contacted by Caryl Pina RN Case Manager for hospice services at home.  Reached out to wife and daughter listed in [place], no answer.  Left message for return call.  Venia Carbon RN, BSN, [facility] Liaison (in [place]) [phone]

## 2018-10-27 NOTE — Progress Notes (Signed)
Pt planned one way extubation at 11AM, 8 family members brought to bedside. Requested palliative consult from PCCM but PCCM felt that was unnecessary.  Family told several times that only 4 would be allowed at bedside after the first hour, but none wanted to leave d/t potential of patient passing. PCCM left some ordered medications, but family refusing everything except comfort medications. Nurse offered option of home hospice and family was very interested. Case management called and reached out to Venia Carbon with home hospice to request potential transfer home. Coordinated by RN and CM, [facility]AR arrived at [place] for transfer and patient sent with yellow DNR form. Patient discharged with foley, Jtube, 4 JP drains, and PICC. Family educated on all drains, foley, and wound care. Supplies given. Medications sent to pharmacy and verified for pick-up. All questions answered and family voiced knowledge and agreement.

## 2018-10-30 DEATH — deceased

## 2018-11-06 NOTE — Progress Notes (Signed)
Chart reviewed; B Brando Taves RN,MHA,BSN  [facility] Supervisor [phone] 

## 2018-11-07 NOTE — Discharge Summary (Signed)
Physician Discharge Summary  Patient ID: Tristan Hughes MRN: [id number] DOB/AGE: [birth year] 77 y.o.  Admit date: 10/01/2018 Discharge date: 10/26/18  Admission Diagnoses: Gastric cancer (very proximal) DM OSA HTN CAD Obesity S/p chemo Moderate protein calorie malnutrition Primary pancreatic neuroendocrine tumor   Discharge Diagnoses:  Active Problems: Same as above and   Anastamotic leak Gastric necrosis Sepsis Metabolic encephalopathy Anemia of chronic illness Congestive heart failure Cardiac arrest (PEA arrest) x 2 Acute respiratory failure with hypoxia [facility])   Discharged Condition: poor  Hospital Course:  Pt was admitted to the ICU following dx laparoscopy, subtotal gastrectomy with esophagogastrostomy, distal pancreatectomy/splenectomy, and J tube 10/01/2018. He initially did well.  He was started on lantus for DM.  Trophic tube feeds were started on POD 2.  He had an episode of hypotension on POD 2 as well.  He received transfusion for HCT that drifted down.  Tube feeds were increased.  UGI was done on POD 5, and it was non diagnostic because he was not very awake and had too much pain to move positions well.    He developed fever.  Repeat UGI was more concerning for leak and he was started on empiric antibiotics. He continued to be febrile, so CTs were done 6/11.  There was concern for gastric perforation at that point, but his drain output appeared to be old hematoma and he was quite stable with minimal pain. Repeat CT was done with contrast via NGT and this was all in the stomach.  NGT was in place with minimal output.   He was clinically improving and plans were made to transition to home with tube feeds and antibiotics.  However, on the morning of 6/17 he became very tachypnic and sats dropped.  CXR appeared to show vascular congestion, so he was given lasix.  This was ineffective and he was transferred to the ICU.  He coded upon intubation and coded again within an  hour.  He was on multiple pressors and CT showed gastric necrosis.    He stabilized on multiple pressors and was taken to the OR that day.  The entire remaining stomach was completely necrotic.  This was resected.  I was unable to get to healthy esophagus to staple, so his esophagus was open on the end after removal of dead stomach and dead anastamosis.  The NGT was left right at the end of the open esophagus and two drains were placed.  The pancreas was not leaking.  The patient was left open and taken back to the OR the next day.  The LUQ was washed out again and did not have much residual purulent or foul drainage.  His fascia was closed with plans to take him back in around 6 weeks with another type of conduit with thoracic anastamosis.  He had an enterotomy at takeback due to the adhesions present.    Pressors weaned to off over the next few days.  He developed bilious drainage from all of his drains and NGT.  He was made DNR.  With family discussions, it was felt that he had such an uphill battle with significant risks, that he would prefer to have withdrawal of life sustaining measures.    Due to the COVID pandemic, all of his family could not be in the hospital at once.  He was terminally extubated and was relatively stable.  He was taken home by family to die at home surrounded by all of his children.    Consults: pulmonary/intensive care  and thoracic surgery, pharmacy  Significant Diagnostic Studies: labs: prior to d/c, gluc 189, Cr 1.05  Treatments: antibiotics: vancomycin, Zosyn and eraxix, insulin: Lantus, TPN, respiratory therapy: vent and surgery: see above  Discharge Exam: Blood pressure (!) 132/56, pulse (!) 125, temperature 98.2 F (36.8 C), temperature source Axillary, resp. rate 18, height 5\' 9"  (1.753 m), weight 100.1 kg, SpO2 98 %. General appearance: weak Resp: shallow respirations GI: bile from drains Extremities: anasarca  Disposition:   Discharge Instructions     Discharge instructions   Complete by: As directed    Morphine via j tube with flushing water behind. zofran under the tongue   Increase activity slowly   Complete by: As directed      Allergies as of 10/26/2018      Reactions   Naproxen Sodium Swelling   SWELLING REACTION UNSPECIFIED       Medication List    STOP taking these medications   amLODipine 5 MG tablet Commonly known as: NORVASC   aspirin EC 81 MG tablet   b complex vitamins tablet   B-D INS SYR HALF-UNIT .3CC/31G 31G X 5/16" 0.3 ML Misc Generic drug: Insulin Syringe-Needle U-100   calcium carbonate 500 MG chewable tablet Commonly known as: TUMS - dosed in mg elemental calcium   dexamethasone 4 MG tablet Commonly known as: DECADRON   ezetimibe 10 MG tablet Commonly known as: ZETIA   Fish Oil 1200 MG Caps   FreeStyle Libre 14 Day Reader [employer] 14 Day Sensor Misc   furosemide 20 MG tablet Commonly known as: LASIX   [place] 300 MG Caps   insulin lispro 100 UNIT/ML injection Commonly known as: HumaLOG   Insulin Syringe-Needle U-100 31G X 5/16" 1 ML Misc   Lantus 100 UNIT/ML injection Generic drug: insulin glargine   lidocaine-prilocaine cream Commonly known as: EMLA   lisinopril 20 MG tablet Commonly known as: ZESTRIL   LORazepam 0.5 MG tablet Commonly known as: Ativan   metFORMIN 1000 MG tablet Commonly known as: GLUCOPHAGE   metoprolol tartrate 50 MG tablet Commonly known as: LOPRESSOR   multivitamin with minerals Tabs tablet   mupirocin ointment 2 % Commonly known as: BACTROBAN   omeprazole 40 MG capsule Commonly known as: PriLOSEC   prochlorperazine 10 MG tablet Commonly known as: COMPAZINE   rosuvastatin 20 MG tablet Commonly known as: CRESTOR   traMADol 50 MG tablet Commonly known as: ULTRAM   vitamin C 1000 MG tablet   Vitamin D-3 125 MCG (5000 UT) Tabs   vitamin E 400 UNIT capsule Commonly known as: vitamin E     TAKE these medications    morphine 20 MG/ML concentrated solution Commonly known as: ROXANOL Take 0.5-1 mLs (10-20 mg total) by mouth every 2 (two) hours as needed for severe pain.   ondansetron 4 MG disintegrating tablet Commonly known as: Zofran ODT Take 1 tablet (4 mg total) by mouth every 8 (eight) hours as needed for nausea or vomiting.      Follow-up Information    [facility] Follow up.   Specialty: [place] Why: HHRN/PT/aide arranged          Signed: Stark Klein 11/07/2018, 4:03 PM

## 2018-11-20 ENCOUNTER — Ambulatory Visit: Payer: Medicare Other | Admitting: Podiatry

## 2018-11-22 ENCOUNTER — Ambulatory Visit: Payer: Medicare Other | Admitting: Family Medicine

## 2018-11-28 ENCOUNTER — Encounter: Payer: Self-pay | Admitting: Family Medicine

## 2020-09-17 IMAGING — DX PORTABLE CHEST - 1 VIEW
1 series · 1 of 1 positions shown · non-contrast
Comparison: PET-CT dated August 15, 2018. CT chest, abdomen, and
pelvis dated July 29, 2018.

CLINICAL DATA: Fever and shortness of breath.

EXAM:
PORTABLE CHEST 1 VIEW

[chest ap]
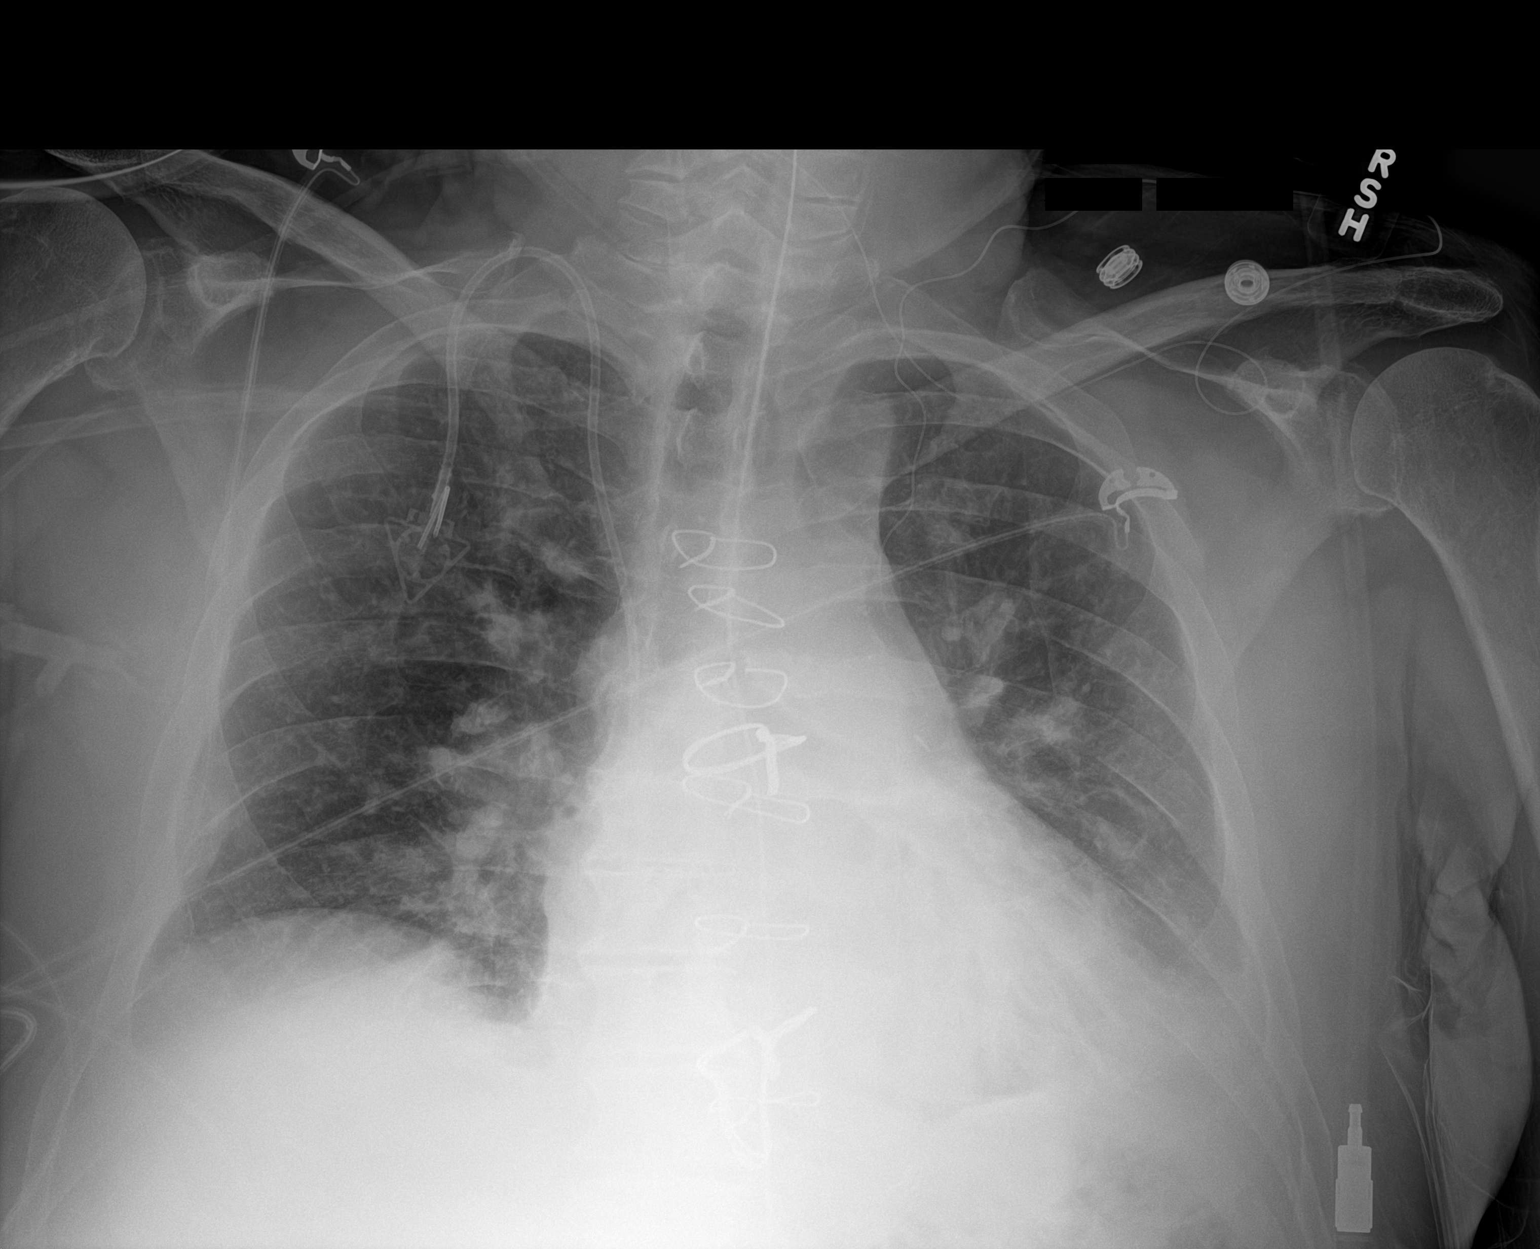

[1 of 1 positions shown; findings below may reference images not displayed]

FINDINGS: Unchanged right chest wall port catheter with tip at the cavoatrial
junction. Enteric tube entering the stomach with the tip below the
field of view. The heart size and mediastinal contours are within
normal limits. Normal pulmonary vascularity. Small left pleural
effusion with adjacent atelectasis. No pneumothorax. No acute
osseous abnormality.
IMPRESSION: 1. Small left pleural effusion with adjacent basilar atelectasis.

## 2020-09-21 IMAGING — CT CT ABDOMEN WITHOUT CONTRAST
2 of 4 series · 15 of 46 positions shown, 17 images · non-contrast
Comparison: 10/10/2018

CLINICAL DATA: 73-year-old male with a history recent proximal
gastrectomy with esophago-gastrostomy, pyloroplasty, jejunostomy
tube placement, distal pancreatectomy, and splenectomy

EXAM:
CT ABDOMEN WITHOUT CONTRAST
TECHNIQUE: Multidetector CT imaging of the abdomen was performed following the
standard protocol without IV contrast.

[Series 3: a/p w/o 5mm · axial · non-contrast · 0.98mm/px · z∈[+886,+1211]mm · 12 of 73 slices shown, 14 images]
[im 4/73  soft-tissue]
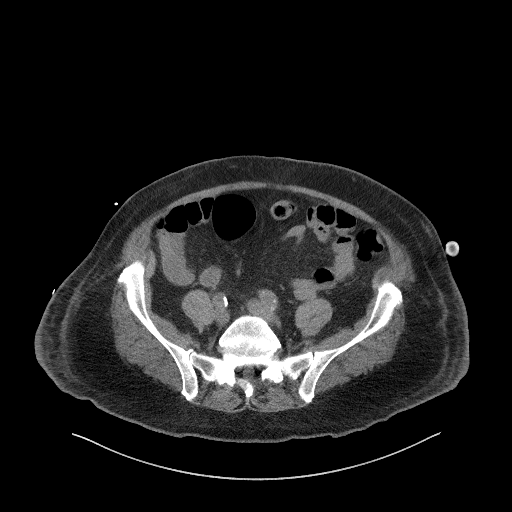
[im 4/73  bone]
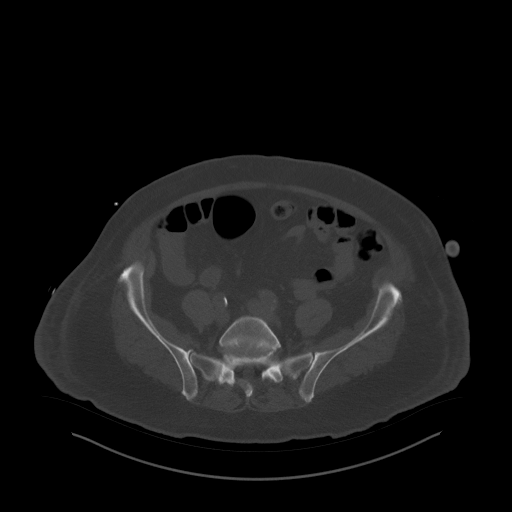
[im 10/73  soft-tissue]
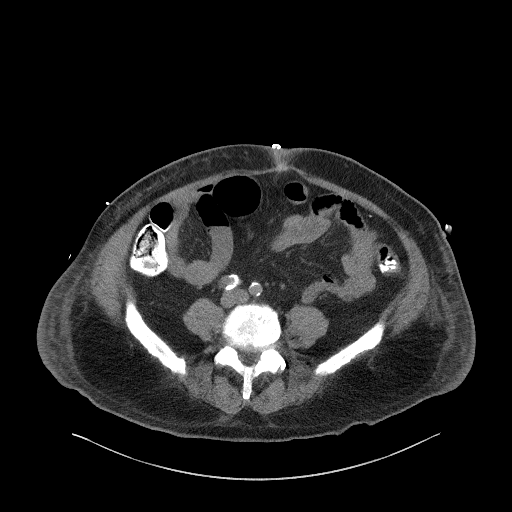
[im 16/73  soft-tissue]
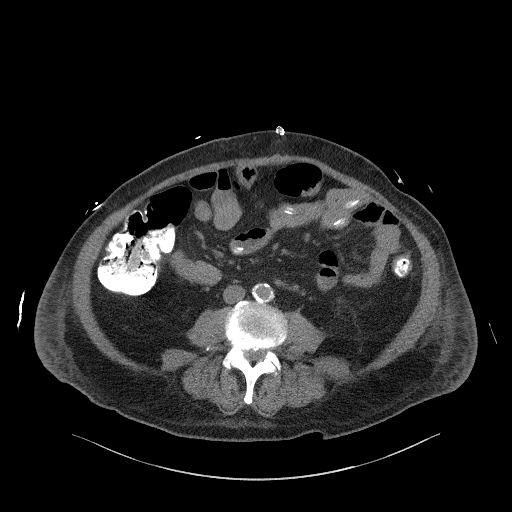
[im 22/73  soft-tissue]
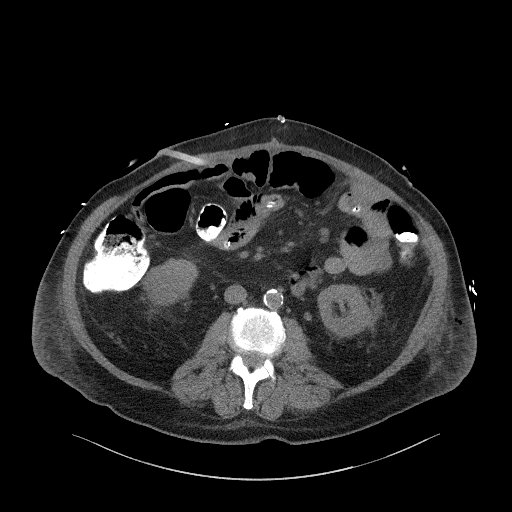
[im 29/73  soft-tissue]
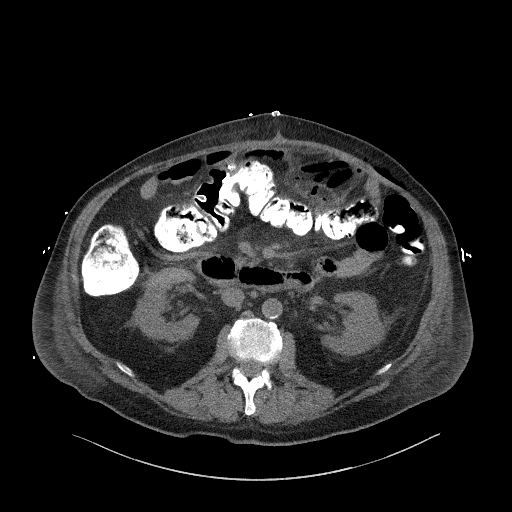
[im 35/73  soft-tissue]
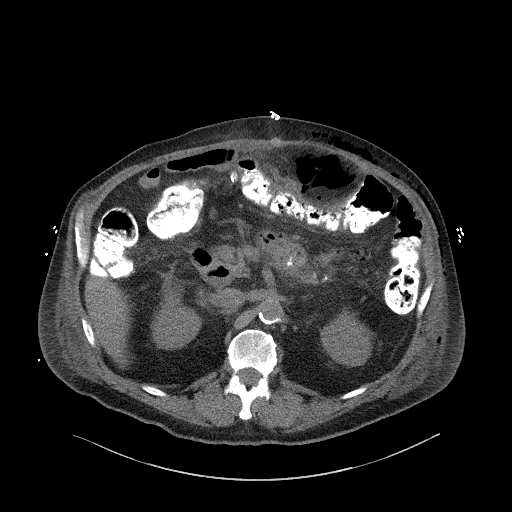
[im 38/73  soft-tissue]
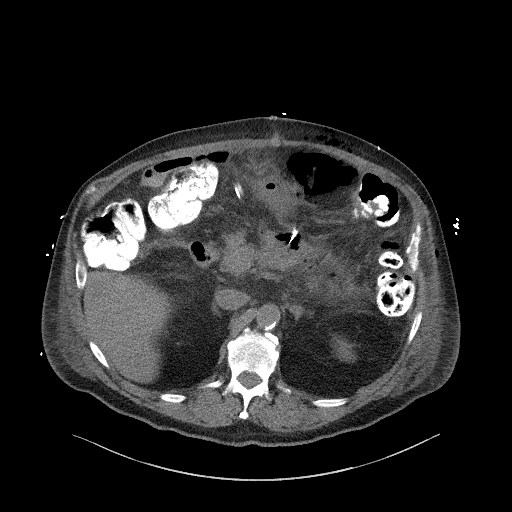
[im 44/73  soft-tissue]
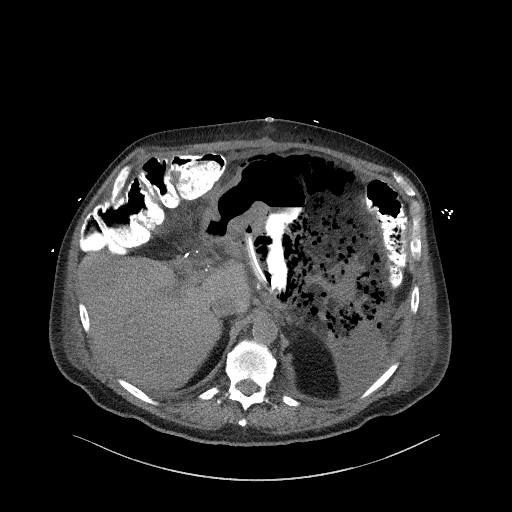
[im 51/73  soft-tissue]
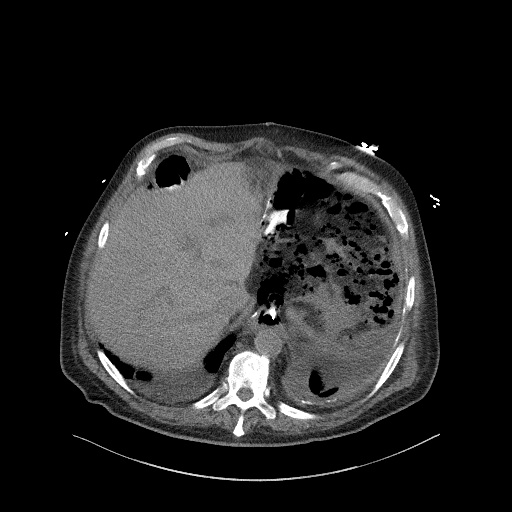
[im 51/73  bone]
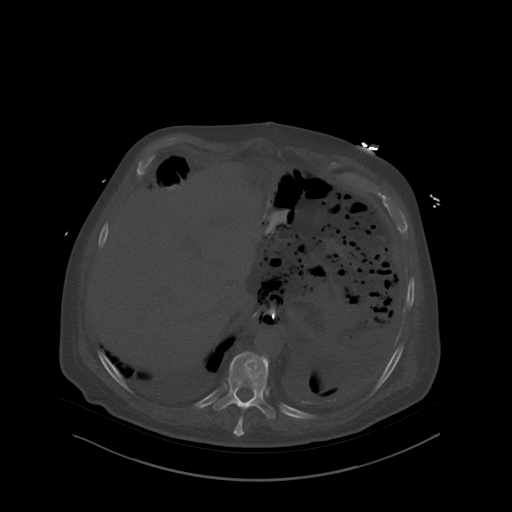
[im 57/73  soft-tissue]
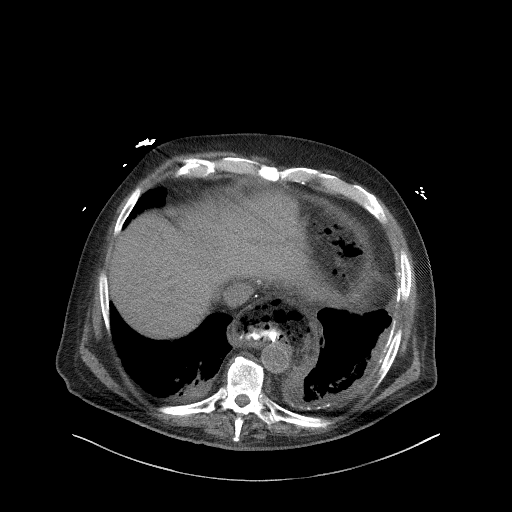
[im 63/73  soft-tissue]
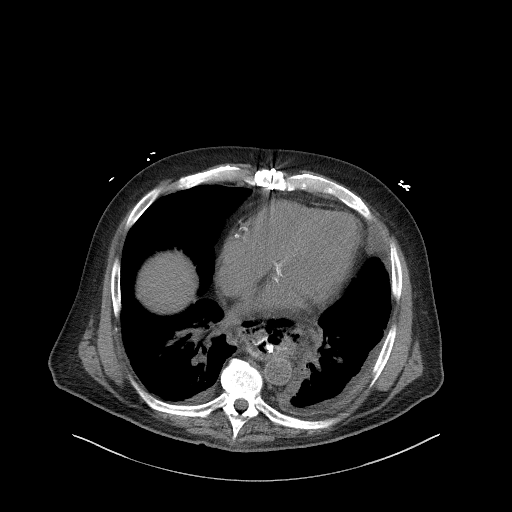
[im 69/73  soft-tissue]
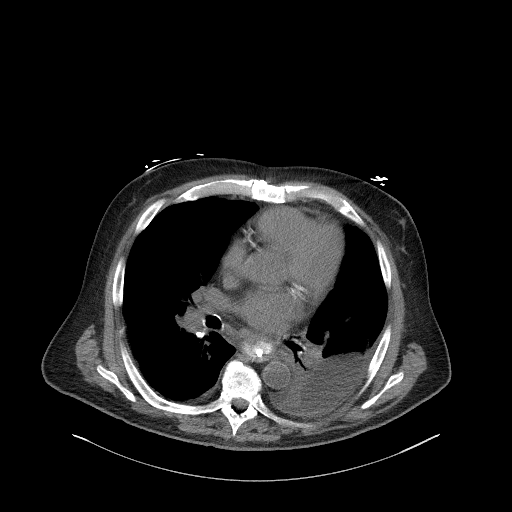

[Series 6: a/p w/o cor · coronal · non-contrast · 0.72mm/px · 3 of 170 slices shown]
[im 57/170  soft-tissue]
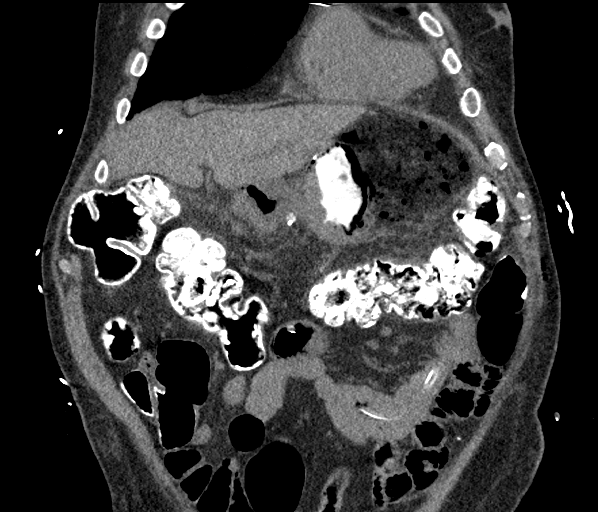
[im 76/170  soft-tissue]
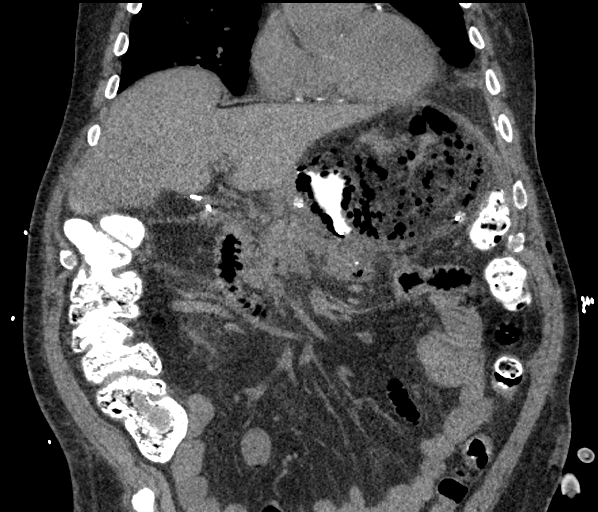
[im 94/170  soft-tissue]
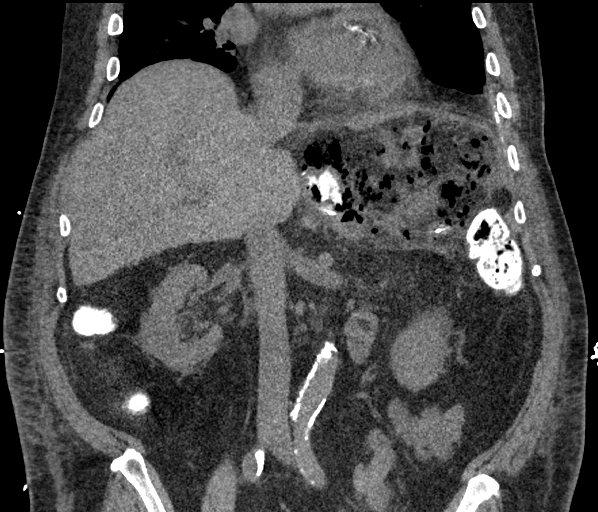

[15 of 46 positions shown; findings below may reference images not displayed]

FINDINGS: Lower chest: Redemonstration left pleural plaques/calcification with
partially loculated pleural fluid on the left, not significantly
changed from the comparison CT. Associated atelectasis of the
bilateral lung bases. Trace right-sided pleural effusion.

There is redemonstration mediastinal gas extending through the
hiatus to the level of the surgical anastomosis. There is no
evidence of pooling GI/enteric contrast within the mediastinum.

Hepatobiliary: Unremarkable liver.

Pancreas: Surgical changes of distal pancreatectomy. Unremarkable
appearance of the pancreatic head/remnant pancreas.

Spleen: Surgical changes of splenectomy

Adrenals/Urinary Tract: Unremarkable adrenal glands.

No evidence of hydronephrosis of the left or right kidney. Trace
perinephric stranding bilaterally. No nephrolithiasis.

Stomach/Bowel: Surgical changes of prior proximal gastrectomy and
primary anastomosis. The anastomosis is present within the lower
mediastinum, just above the GE junction. There is flocculent gas
within the lower mediastinum extending through the hiatus, as well
as at the site of the surgery in the left upper quadrant, at the
splenectomy site and distal pancreatectomy state. There is no
evidence of extraluminal contrast adjacent to the stomach or in the
left upper quadrant. The fluid collection in the left upper quadrant
posteriorly is changed configuration, more elongated configuration,
perhaps slightly smaller than the prior to unchanged.

Enteric contrast is completely within the lumen of the stomach. The
contrast is pooled dependently within the stomach, with no contrast
at the pyloric channel.

Gastric tube terminates within the lumen of the stomach, beyond the
anastomosis and the hiatus. Wall of the stomach not well evaluated
given the absence of IV contrast, though there is no gross evidence
of disruption.

Percutaneous jejunostomy tube in place.

Surgical drain from right abdominal approach terminates within the
left upper quadrant.

No abnormally distended visualized small bowel or colon. Retained
enteric contrast within the colon which is not distended.

Vascular/Lymphatic: Atherosclerosis.

Other: Surgical staples along the midline. Decreasing gas in the
subcutaneous tissues of the left upper abdomen.

Musculoskeletal: No displaced fracture. Degenerative changes of the
spine.
IMPRESSION: Abdominal CT demonstrates surgical changes of proximal gastrectomy
with primary anastomosis, with no evidence of enteric contrast
leak/anastomotic leak or stomach wall disruption after
administration of PO contrast. The most anti dependent aspects of
the stomach are not well evaluated given the layering of the
contrast.

Gastric tube terminates within the lumen of the stomach.

Surgical changes of distal pancreatectomy and splenectomy with
persisting flocculent gas/fluid in the left upper quadrant. Leading
differential would be infection, with perforation/disruption at the
surgical site or stomach not likely given the absence of
extraluminal contrast. Unchanged surgical drain.

These results were discussed by telephone at the time of
interpretation on 10/11/2018 at [DATE] with Dr. UMI PULVER.

Similar appearance of left greater than the right dependent pleural
fluid and associated atelectasis/scarring.

Unchanged jejunostomy tube.

Atherosclerosis.
